# Patient Record
Sex: Male | Born: 1950 | Race: White | Hispanic: No | Marital: Married | Smoking: Never smoker
Health system: Southern US, Community
[De-identification: ages and names within clinical notes are randomized; demographics above are authoritative.]

## PROBLEM LIST (undated history)

## (undated) DIAGNOSIS — I219 Acute myocardial infarction, unspecified: Secondary | ICD-10-CM

## (undated) DIAGNOSIS — I639 Cerebral infarction, unspecified: Secondary | ICD-10-CM

## (undated) DIAGNOSIS — I739 Peripheral vascular disease, unspecified: Secondary | ICD-10-CM

## (undated) DIAGNOSIS — I2510 Atherosclerotic heart disease of native coronary artery without angina pectoris: Secondary | ICD-10-CM

## (undated) DIAGNOSIS — K219 Gastro-esophageal reflux disease without esophagitis: Secondary | ICD-10-CM

## (undated) DIAGNOSIS — I10 Essential (primary) hypertension: Secondary | ICD-10-CM

## (undated) HISTORY — PX: CORONARY ARTERY BYPASS GRAFT: SHX141

## (undated) HISTORY — PX: TONSILLECTOMY: SUR1361

## (undated) HISTORY — PX: ROTATOR CUFF REPAIR: SHX139

## (undated) HISTORY — DX: Atherosclerotic heart disease of native coronary artery without angina pectoris: I25.10

## (undated) HISTORY — PX: EYE SURGERY: SHX253

## (undated) HISTORY — DX: Cerebral infarction, unspecified: I63.9

## (undated) HISTORY — DX: Peripheral vascular disease, unspecified: I73.9

## (undated) HISTORY — PX: COLONOSCOPY WITH ESOPHAGOGASTRODUODENOSCOPY (EGD): SHX5779

## (undated) HISTORY — PX: MASTOIDECTOMY: SHX711

## (undated) HISTORY — PX: HERNIA REPAIR: SHX51

---

## 1992-04-05 DIAGNOSIS — I219 Acute myocardial infarction, unspecified: Secondary | ICD-10-CM

## 1992-04-05 HISTORY — DX: Acute myocardial infarction, unspecified: I21.9

## 2018-03-07 ENCOUNTER — Other Ambulatory Visit: Payer: Self-pay

## 2018-03-07 ENCOUNTER — Ambulatory Visit (INDEPENDENT_AMBULATORY_CARE_PROVIDER_SITE_OTHER): Payer: Medicare Other | Admitting: Vascular Surgery

## 2018-03-07 ENCOUNTER — Encounter: Payer: Self-pay | Admitting: *Deleted

## 2018-03-07 ENCOUNTER — Encounter: Payer: Self-pay | Admitting: Vascular Surgery

## 2018-03-07 DIAGNOSIS — I724 Aneurysm of artery of lower extremity: Secondary | ICD-10-CM

## 2018-03-07 MED ORDER — APIXABAN 5 MG PO TABS: 5.0000 mg | ORAL_TABLET | Freq: Two times a day (BID) | ORAL | 0 refills | Status: DC

## 2018-03-07 NOTE — Progress Notes (Signed)
Patient name: Julian Miranda MRN: [id number] DOB: 01/25/1951 Sex: male  REASON FOR CONSULT: Right popliteal aneurysm  HPI: Julian Miranda is a 67 y.o. male, with history of coronary artery disease status post PCI, hypertension that presents for evaluation of newly diagnosed right popliteal artery aneurysm.  Patient states he has been having pain behind his right knee for the last month particularly when bending or driving.  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  He presents today for further evaluation.  He states no previous family knowledge of aneurysmal disease.  He has no previous knowledge of an aneurysm.  He denies tobacco abuse.  States he does take an aspirin daily.  He is now retired but very functional and plays golf weekly.  No previous lower extremity interventions.  Past Medical History:  Diagnosis Date  . CAD (coronary artery disease)   . Peripheral vascular disease ([place])   . Stroke [facility])     History reviewed. No pertinent surgical history.  Family History  Problem Relation Age of Onset  . Heart disease Mother     SOCIAL HISTORY: Social History   Socioeconomic History  . Marital status: Married    Spouse name: Not on file  . Number of children: Not on file  . Years of education: Not on file  . Highest education level: Not on file  Occupational History  . Not on file  Social Needs  . Financial resource strain: Not on file  . Food insecurity:    Worry: Not on file    Inability: Not on file  . Transportation needs:    Medical: Not on file    Non-medical: Not on file  Tobacco Use  . Smoking status: Never Smoker  . Smokeless tobacco: Never Used  Substance and Sexual Activity  . Alcohol use: Never    Frequency: Never  . Drug use: Never  . Sexual activity: Not on file  Lifestyle  . Physical activity:    Days per week:  Not on file    Minutes per session: Not on file  . Stress: Not on file  Relationships  . Social connections:    Talks on phone: Not on file    Gets together: Not on file    Attends religious service: Not on file    Active member of club or organization: Not on file    Attends meetings of clubs or organizations: Not on file    Relationship status: Not on file  . Intimate partner violence:    Fear of current or ex partner: Not on file    Emotionally abused: Not on file    Physically abused: Not on file    Forced sexual activity: Not on file  Other Topics Concern  . Not on file  Social History Narrative  . Not on file    No Known Allergies  Current Outpatient Medications  Medication Sig Dispense Refill  . ALPRAZolam (XANAX) 0.25 MG tablet     . amLODipine (NORVASC) 10 MG tablet     . aspirin 81 MG tablet Take 81 mg by mouth daily.    Marland Kitchen atorvastatin (LIPITOR) 10 MG tablet     . cloNIDine (CATAPRES) 0.2 MG tablet     . diazepam (VALIUM) 2 MG tablet     . irbesartan-hydrochlorothiazide (AVALIDE) 300-12.5 MG tablet     .  meclizine (ANTIVERT) 25 MG tablet Take 25 mg by mouth as needed for dizziness.    Marland Kitchen omeprazole (PRILOSEC) 20 MG capsule      No current facility-administered medications for this visit.     REVIEW OF SYSTEMS:  [X]  denotes positive finding, [ ]  denotes negative finding Cardiac  Comments:  Chest pain or chest pressure:    Shortness of breath upon exertion:    Short of breath when lying flat:    Irregular heart rhythm:        Vascular    Pain in calf, thigh, or hip brought on by ambulation:    Pain in feet at night that wakes you up from your sleep:     Blood clot in your veins:    Leg swelling:     Pain behind right knee x   Pulmonary    Oxygen at home:    Productive cough:     Wheezing:         Neurologic    Sudden weakness in arms or legs:     Sudden numbness in arms or legs:     Sudden onset of difficulty speaking or slurred speech:      Temporary loss of vision in one eye:     Problems with dizziness:         Gastrointestinal    Blood in stool:     Vomited blood:         Genitourinary    Burning when urinating:     Blood in urine:        Psychiatric    Major depression:         Hematologic    Bleeding problems:    Problems with blood clotting too easily:        Skin    Rashes or ulcers:        Constitutional    Fever or chills:      PHYSICAL EXAM: Vitals:   03/07/18 1417  BP: (!) 153/90  Pulse: 79  Resp: 18  SpO2: 95%  Weight: 232 lb (105.2 kg)  Height: 5\' 10"  (1.778 m)    GENERAL: The patient is a well-nourished male, in no acute distress. The vital signs are documented above. CARDIAC: There is a regular rate and rhythm.  VASCULAR:  2+ radial pulse palpable bilateral upper extremities 2+ palpable bilateral femoral pulses 2+ left popliteal pulse Bounding right popliteal pulse 2+ palpable DP/PT bilateral feet, no tissue loss PULMONARY: There is good air exchange bilaterally without wheezing or rales. ABDOMEN: Soft and non-tender with normal pitched bowel sounds.  No appreciable aneurysm. MUSCULOSKELETAL: There are no major deformities or cyanosis. NEUROLOGIC: No focal weakness or paresthesias are detected. SKIN: There are no ulcers or rashes noted. PSYCHIATRIC: The patient has a normal affect.  DATA:   I independently reviewed his MRI which does show an approximate 4 cm popliteal aneurysm on the right with evidence of laminar thrombus - limited study and unable to evaluate run-off or full extent of aneurysm.  Assessment/Plan:  I had a long discussion with Julian Miranda regarding the etiology and nature of popliteal artery aneurysms.  Discussed that we typically repair popliteal aneurysms greater than 2 cm given the risk for thrombosis and/or embolism.  Given his aneurysm is 4 cm on MRI I think he certainly merits repair.  Unfortunately the MRI that was obtained is somewhat limited for operative  planning.  I have subsequently recommended a CTA abdomen pelvis with bilateral lower extremity runoff.  This will allow [place] to further evaluate his right popliteal aneurysm and discussed options including endovascular covered stent versus bypass either through a medial versus posterior approach.  In addition we can rule out any evidence of an associated abdominal aortic aneurysm or contralateral popliteal artery aneurysm that are frequently associated.  I will go ahead and schedule him for surgery on 03/27/2018 for right popliteal aneurysm repair with tentative plans for bypass and exclusion.  Specific plans will change according to CTA results and vein mapping.   Given the large size of his aneurysm and the fact that there is thrombus within the aneurysm I also put him on 5 mg of Eliquis twice daily while we obtain further imaging for work-up and while awaiting surgical intervention.  I will contact him after review of CT and vein mapping for final plans.   Marty Heck, MD [facility] Office: [phone] Pager: [place]

## 2018-03-08 ENCOUNTER — Ambulatory Visit (HOSPITAL_COMMUNITY)
Admission: RE | Admit: 2018-03-08 | Discharge: 2018-03-08 | Disposition: A | Discharge status: Home / Self Care | Payer: Medicare Other | Source: Ambulatory Visit | Attending: Internal Medicine | Admitting: Internal Medicine

## 2018-03-08 ENCOUNTER — Other Ambulatory Visit: Payer: Self-pay | Admitting: *Deleted

## 2018-03-08 DIAGNOSIS — I724 Aneurysm of artery of lower extremity: Secondary | ICD-10-CM | POA: Diagnosis present

## 2018-03-10 ENCOUNTER — Ambulatory Visit
Admission: RE | Admit: 2018-03-10 | Discharge: 2018-03-10 | Disposition: A | Discharge status: Home / Self Care | Payer: Medicare Other | Source: Ambulatory Visit | Attending: Vascular Surgery | Admitting: Vascular Surgery

## 2018-03-10 DIAGNOSIS — I724 Aneurysm of artery of lower extremity: Secondary | ICD-10-CM

## 2018-03-10 MED ORDER — IOPAMIDOL (ISOVUE-370) INJECTION 76%
125.0000 mL | Freq: Once | INTRAVENOUS | Status: AC | PRN
  Administered 2018-03-10: 125 mL via INTRAVENOUS

## 2018-03-15 ENCOUNTER — Telehealth: Payer: Self-pay | Admitting: Vascular Surgery

## 2018-03-15 NOTE — Telephone Encounter (Signed)
Called and talked to [name] since unable to get in touch with [name] on the phone.  Discussed review of CTA that shows greater than 4 cm right popliteal aneurysm.  He has adequate great saphenous vein in his right leg.  We will plan for right popliteal artery aneurysm repair next week with a bypass and exclusion.  Discussed that I will try and call again later this week or early next week to see if he has any additional questions.  Marty Heck, MD [facility] [place] Office: [phone] Pager: [place]

## 2018-03-16 NOTE — Pre-Procedure Instructions (Signed)
Paden Senger  03/16/2018      [facility] [place], [place] [address] [place] [address] Phone: [phone] Fax: [phone]    Your procedure is scheduled on Thursday December 19.  Report to [facility] Admitting at 5:30 A.M.  Call this number if you have problems the morning of surgery:  [phone]   Remember:  Do not eat or drink after midnight.    Take these medicines the morning of surgery with A SIP OF WATER:   Clonidine (Catapres) Metoprolol (Toprol XL) Prilosec (Omeprazole) Xanax if needed  7 days prior to surgery STOP taking any Aleve, Naproxen, Ibuprofen, Motrin, Advil, Goody's, BC's, all herbal medications, fish oil, and all vitamins  FOLLOW YOUR surgeon's instructions on STOPPING Aspirin and Eliquis (Apixaban). If no instructions were given, please call your surgeon's office.     Do not wear jewelry, make-up or nail polish.  Do not wear lotions, powders, or perfumes, or deodorant.  Do not shave 48 hours prior to surgery.  Men may shave face and neck.  Do not bring valuables to the hospital.  [facility] is not responsible for any belongings or valuables.  Contacts, dentures or bridgework may not be worn into surgery.  Leave your suitcase in the car.  After surgery it may be brought to your room.  For patients admitted to the hospital, discharge time will be determined by your treatment team.  Patients discharged the day of surgery will not be allowed to drive home.   Special instructions:    [place]- Preparing For Surgery  Before surgery, you can play an important role. Because skin is not sterile, your skin needs to be as free of germs as possible. You can reduce the number of germs on your skin by washing with CHG (chlorahexidine gluconate) Soap before surgery.  CHG is an antiseptic cleaner which kills germs and bonds with the skin to continue killing germs even after washing.     Oral Hygiene is also important to reduce your risk of infection.  Remember - BRUSH YOUR TEETH THE MORNING OF SURGERY WITH YOUR REGULAR TOOTHPASTE  Please do not use if you have an allergy to CHG or antibacterial soaps. If your skin becomes reddened/irritated stop using the CHG.  Do not shave (including legs and underarms) for at least 48 hours prior to first CHG shower. It is OK to shave your face.  Please follow these instructions carefully.   1. Shower the NIGHT BEFORE SURGERY and the MORNING OF SURGERY with CHG.   2. If you chose to wash your hair, wash your hair first as usual with your normal shampoo.  3. After you shampoo, rinse your hair and body thoroughly to remove the shampoo.  4. Use CHG as you would any other liquid soap. You can apply CHG directly to the skin and wash gently with a scrungie or a clean washcloth.   5. Apply the CHG Soap to your body ONLY FROM THE NECK DOWN.  Do not use on open wounds or open sores. Avoid contact with your eyes, ears, mouth and genitals (private parts). Wash Face and genitals (private parts)  with your normal soap.  6. Wash thoroughly, paying special attention to the area where your surgery will be performed.  7. Thoroughly rinse your body with warm water from the neck down.  8. DO NOT shower/wash with your normal soap after using and rinsing off the CHG Soap.  9.  Pat yourself dry with a CLEAN TOWEL.  10. Wear CLEAN PAJAMAS to bed the night before surgery, wear comfortable clothes the morning of surgery  11. Place CLEAN SHEETS on your bed the night of your first shower and DO NOT SLEEP WITH PETS.    Day of Surgery:  Do not apply any deodorants/lotions.  Please wear clean clothes to the hospital/surgery center.   Remember to brush your teeth WITH YOUR REGULAR TOOTHPASTE.    Please read over the following fact sheets that you were given. Coughing and Deep Breathing, MRSA Information and Surgical Site Infection  Prevention

## 2018-03-17 ENCOUNTER — Encounter (HOSPITAL_COMMUNITY): Payer: Self-pay

## 2018-03-17 ENCOUNTER — Encounter (HOSPITAL_COMMUNITY)
Admission: RE | Admit: 2018-03-17 | Discharge: 2018-03-17 | Disposition: A | Discharge status: Home / Self Care | Payer: Medicare Other | Source: Ambulatory Visit | Attending: Vascular Surgery | Admitting: Vascular Surgery

## 2018-03-17 ENCOUNTER — Other Ambulatory Visit: Payer: Self-pay

## 2018-03-17 DIAGNOSIS — I739 Peripheral vascular disease, unspecified: Secondary | ICD-10-CM | POA: Insufficient documentation

## 2018-03-17 DIAGNOSIS — Z7982 Long term (current) use of aspirin: Secondary | ICD-10-CM | POA: Diagnosis not present

## 2018-03-17 DIAGNOSIS — I2510 Atherosclerotic heart disease of native coronary artery without angina pectoris: Secondary | ICD-10-CM | POA: Insufficient documentation

## 2018-03-17 DIAGNOSIS — Z01812 Encounter for preprocedural laboratory examination: Secondary | ICD-10-CM | POA: Diagnosis not present

## 2018-03-17 DIAGNOSIS — I724 Aneurysm of artery of lower extremity: Secondary | ICD-10-CM | POA: Insufficient documentation

## 2018-03-17 DIAGNOSIS — Z79899 Other long term (current) drug therapy: Secondary | ICD-10-CM | POA: Diagnosis not present

## 2018-03-17 HISTORY — DX: Essential (primary) hypertension: I10

## 2018-03-17 HISTORY — DX: Acute myocardial infarction, unspecified: I21.9

## 2018-03-17 LAB — SURGICAL PCR SCREEN
MRSA, PCR: NEGATIVE
Staphylococcus aureus: NEGATIVE

## 2018-03-17 LAB — PROTIME-INR
INR: 1.18
PROTHROMBIN TIME: 14.8 s (ref 11.4–15.2)

## 2018-03-17 LAB — TYPE AND SCREEN
ABO/RH(D): O POS
Antibody Screen: NEGATIVE

## 2018-03-17 LAB — URINALYSIS, ROUTINE W REFLEX MICROSCOPIC
Bacteria, UA: NONE SEEN
Bilirubin Urine: NEGATIVE
Glucose, UA: NEGATIVE mg/dL
Hgb urine dipstick: NEGATIVE
Ketones, ur: NEGATIVE mg/dL
Leukocytes, UA: NEGATIVE
Nitrite: NEGATIVE
Protein, ur: NEGATIVE mg/dL
Specific Gravity, Urine: 1.005 (ref 1.005–1.030)
pH: 7 (ref 5.0–8.0)

## 2018-03-17 LAB — COMPREHENSIVE METABOLIC PANEL
ALT: 53 U/L — ABNORMAL HIGH (ref 0–44)
AST: 28 U/L (ref 15–41)
Albumin: 4.8 g/dL (ref 3.5–5.0)
Alkaline Phosphatase: 60 U/L (ref 38–126)
Anion gap: 13 (ref 5–15)
BUN: 13 mg/dL (ref 8–23)
CO2: 27 mmol/L (ref 22–32)
Calcium: 9.9 mg/dL (ref 8.9–10.3)
Chloride: 99 mmol/L (ref 98–111)
Creatinine, Ser: 0.98 mg/dL (ref 0.61–1.24)
GFR calc Af Amer: >60 mL/min (ref >60)
GFR calc non Af Amer: >60 mL/min (ref >60)
Glucose, Bld: 100 mg/dL — ABNORMAL HIGH (ref 70–99)
Potassium: 3.7 mmol/L (ref 3.5–5.1)
Sodium: 139 mmol/L (ref 135–145)
Total Bilirubin: 1.4 mg/dL — ABNORMAL HIGH (ref 0.3–1.2)
Total Protein: 7.9 g/dL (ref 6.5–8.1)

## 2018-03-17 LAB — CBC
HCT: 46.8 % (ref 39.0–52.0)
Hemoglobin: 15.4 g/dL (ref 13.0–17.0)
MCH: 30.9 pg (ref 26.0–34.0)
MCHC: 32.9 g/dL (ref 30.0–36.0)
MCV: 94 fL (ref 80.0–100.0)
NRBC: 0 % (ref 0.0–0.2)
Platelets: 254 10*3/uL (ref 150–400)
RBC: 4.98 MIL/uL (ref 4.22–5.81)
RDW: 13.2 % (ref 11.5–15.5)
WBC: 6.6 10*3/uL (ref 4.0–10.5)

## 2018-03-17 LAB — ABO/RH: ABO/RH(D): O POS

## 2018-03-17 LAB — APTT: aPTT: 36 seconds (ref 24–36)

## 2018-03-17 NOTE — Progress Notes (Signed)
PCP - Lonia Mad MD Cardiologist - Cleora Fleet MD  Chest x-ray - N/A  EKG - requested Stress Test - requested ECHO - requested  Blood Thinner Instructions: Last dose will be 12/16 Per [facility] Aspirin Instructions:Last dose will be 12/16 Per [facility]  Anesthesia review: Hx CAD, requested docs from [place]  Patient denies shortness of breath, fever, cough and chest pain at PAT appointment   Patient verbalized understanding of instructions that were given to them at the PAT appointment. Patient was also instructed that they will need to review over the PAT instructions again at home before surgery.

## 2018-03-20 NOTE — Progress Notes (Signed)
Anesthesia Chart Review:  Case:  [id number] Date/Time:  03/23/18 0715   Procedure:  BYPASS GRAFT POPLITEAL TO POPLITEAL (Right )   Anesthesia type:  General   Pre-op diagnosis:  right popliteal aneurysm   Location:  [facility] OR ROOM 16 / [facility] OR   Surgeon:  Marty Heck, MD      DISCUSSION: 67 yo male never smoker. Pertinent hx includes CAD (s/p Lt CX PTCA 1993 and 2015), TIA, HTN, PVD.  Pt follows with cardiology, Dr. Cleora Fleet. Per last OV note 11/03/2017, "History of known coronary artery disease with left circumflex PTCA.  Able to maintain same level activity without any fatigue or shortness of breath.  No exertional or rest chest pain.  Able to lie flat without a problem does not wake up at night because of shortness of breath, no swelling reported in lower extremities."  Echo 06/03/2017 shows normal left ventricular systolic function, EF 21%, no segmental wall motion abnormalities, mild TR, trace MR.  Anticipate he can proceed as planned barring acute status change.  VS: BP 132/75   Pulse 95   Temp (!) 36.4 C   Resp 18   Ht 5\' 10"  (1.778 m)   Wt 103.7 kg   BMI 32.82 kg/m   PROVIDERS: Lonia Mad, MD is PCP  Cleora Fleet, MD is Cardiologist   LABS: Labs reviewed: Acceptable for surgery. (all labs ordered are listed, but only abnormal results are displayed)  Labs Reviewed  COMPREHENSIVE METABOLIC PANEL - Abnormal; Notable for the following components:      Result Value   Glucose, Bld 100 (*)    ALT 53 (*)    Total Bilirubin 1.4 (*)    All other components within normal limits  URINALYSIS, ROUTINE W REFLEX MICROSCOPIC - Abnormal; Notable for the following components:   Color, Urine STRAW (*)    All other components within normal limits  SURGICAL PCR SCREEN  APTT  CBC  PROTIME-INR  TYPE AND SCREEN  ABO/RH    EKG: 11/03/2017 (outside record, copy on pt chart): Sinus rhythm, Rate 73. Occassionally ectopic ventricular beat. Inferior infarct, age undetermined.  Old anterior infarct.  CV: TTE 06/03/2017 (outside record, copy on pt chart):: Impression: Study shows normal left ventricular systolic function ejection fraction 55%, no segmental wall motion abnormality seen.  Findings: Concentric left ventricular hypertrophy grade 1 diastolic function.  Dilated IVC with normal inspiratory collapse.  All chambers appear to be within normal limits.  Atrial septum intact with no evidence of atrial or ventricular septal defect.  There is no intracardiac thrombus or pericardial effusion seen.  Aortic valve sclerosis, trileaflet, adequate cusp separation no evidence of stenosis.  Mitral valve shows annular calcification, delicate leaflets adequate diastolic excursion.  Tricuspid valve is within normal limits.  Pulmonary valve poorly visualized.  Doppler and color-flow shows mild TR trace MR.  Past Medical History:  Diagnosis Date  . CAD (coronary artery disease)   . Hypertension   . Myocardial infarction ([place]) 1994  . Peripheral vascular disease ([place])   . Stroke [facility])     Past Surgical History:  Procedure Laterality Date  . COLONOSCOPY WITH ESOPHAGOGASTRODUODENOSCOPY (EGD)    . EYE SURGERY Bilateral    cataracts  . HERNIA REPAIR    . MASTOIDECTOMY    . ROTATOR CUFF REPAIR Right     MEDICATIONS: . ALPRAZolam (XANAX) 0.25 MG tablet  . amLODipine (NORVASC) 10 MG tablet  . apixaban (ELIQUIS) 5 MG TABS tablet  . aspirin 81 MG tablet  .  atorvastatin (LIPITOR) 10 MG tablet  . cloNIDine (CATAPRES) 0.2 MG tablet  . diazepam (VALIUM) 2 MG tablet  . irbesartan-hydrochlorothiazide (AVALIDE) 300-12.5 MG tablet  . meclizine (ANTIVERT) 25 MG tablet  . metoprolol succinate (TOPROL-XL) 25 MG 24 hr tablet  . Multiple Minerals (CALCIUM/MAGNESIUM/ZINC PO)  . omeprazole (PRILOSEC) 20 MG capsule   No current facility-administered medications for this encounter.      Wynonia Musty [facility] Short Stay Center/Anesthesiology Phone [phone] 03/20/2018 4:37  PM

## 2018-03-20 NOTE — Anesthesia Preprocedure Evaluation (Addendum)
Anesthesia Evaluation  Patient identified by MRN, date of birth, ID band Patient awake    Reviewed: Allergy & Precautions, NPO status , Patient's Chart, lab work & pertinent test results, reviewed documented beta blocker date and time   History of Anesthesia Complications Negative for: history of anesthetic complications  Airway Mallampati: II  TM Distance: >3 FB Neck ROM: Full    Dental  (+) Caps, Dental Advisory Given   Pulmonary neg pulmonary ROS,    breath sounds clear to auscultation       Cardiovascular hypertension, Pt. on medications and Pt. on home beta blockers (-) angina+ CAD (PTCA) and + Peripheral Vascular Disease   Rhythm:Regular Rate:Normal  3/19 ECHO: EF 55%,valves OK   Neuro/Psych TIAnegative psych ROS   GI/Hepatic Neg liver ROS, GERD  Medicated and Controlled,  Endo/Other  Morbid obesity  Renal/GU negative Renal ROS     Musculoskeletal   Abdominal (+) + obese,   Peds  Hematology eliquis   Anesthesia Other Findings   Reproductive/Obstetrics                           Anesthesia Physical Anesthesia Plan  ASA: III  Anesthesia Plan: General   Post-op Pain Management:    Induction: Intravenous  PONV Risk Score and Plan: 2 and Ondansetron and Dexamethasone  Airway Management Planned: Oral ETT  Additional Equipment:   Intra-op Plan:   Post-operative Plan: Extubation in OR  Informed Consent: I have reviewed the patients History and Physical, chart, labs and discussed the procedure including the risks, benefits and alternatives for the proposed anesthesia with the patient or authorized representative who has indicated his/her understanding and acceptance.   Dental advisory given  Plan Discussed with: CRNA and Surgeon  Anesthesia Plan Comments: (See PAT note 03/17/2018 by Karoline Caldwell, PA-C Plan routine monitors, GETA)     Anesthesia Quick Evaluation

## 2018-03-23 ENCOUNTER — Other Ambulatory Visit: Payer: Self-pay

## 2018-03-23 ENCOUNTER — Inpatient Hospital Stay (HOSPITAL_COMMUNITY)
Admission: RE | Admit: 2018-03-23 | Discharge: 2018-03-27 | DRG: 254 | Disposition: A | Discharge status: Home Health | Payer: Medicare Other | Attending: Vascular Surgery | Admitting: Vascular Surgery

## 2018-03-23 ENCOUNTER — Inpatient Hospital Stay (HOSPITAL_COMMUNITY): Payer: Medicare Other | Admitting: Certified Registered"

## 2018-03-23 ENCOUNTER — Encounter (HOSPITAL_COMMUNITY): Payer: Self-pay | Admitting: *Deleted

## 2018-03-23 ENCOUNTER — Encounter (HOSPITAL_COMMUNITY)
Admission: RE | Disposition: A | Discharge status: Home Health | Payer: Self-pay | Source: Home / Self Care | Attending: Vascular Surgery

## 2018-03-23 ENCOUNTER — Inpatient Hospital Stay (HOSPITAL_COMMUNITY): Payer: Medicare Other | Admitting: Vascular Surgery

## 2018-03-23 DIAGNOSIS — Z955 Presence of coronary angioplasty implant and graft: Secondary | ICD-10-CM

## 2018-03-23 DIAGNOSIS — I739 Peripheral vascular disease, unspecified: Secondary | ICD-10-CM | POA: Diagnosis present

## 2018-03-23 DIAGNOSIS — I2510 Atherosclerotic heart disease of native coronary artery without angina pectoris: Secondary | ICD-10-CM | POA: Diagnosis present

## 2018-03-23 DIAGNOSIS — Z8673 Personal history of transient ischemic attack (TIA), and cerebral infarction without residual deficits: Secondary | ICD-10-CM | POA: Diagnosis not present

## 2018-03-23 DIAGNOSIS — M25571 Pain in right ankle and joints of right foot: Secondary | ICD-10-CM | POA: Diagnosis not present

## 2018-03-23 DIAGNOSIS — I10 Essential (primary) hypertension: Secondary | ICD-10-CM | POA: Diagnosis present

## 2018-03-23 DIAGNOSIS — I724 Aneurysm of artery of lower extremity: Principal | ICD-10-CM | POA: Diagnosis present

## 2018-03-23 DIAGNOSIS — Z79899 Other long term (current) drug therapy: Secondary | ICD-10-CM | POA: Diagnosis not present

## 2018-03-23 DIAGNOSIS — Z8739 Personal history of other diseases of the musculoskeletal system and connective tissue: Secondary | ICD-10-CM | POA: Diagnosis not present

## 2018-03-23 DIAGNOSIS — Z8249 Family history of ischemic heart disease and other diseases of the circulatory system: Secondary | ICD-10-CM

## 2018-03-23 DIAGNOSIS — Z7982 Long term (current) use of aspirin: Secondary | ICD-10-CM | POA: Diagnosis not present

## 2018-03-23 HISTORY — DX: Gastro-esophageal reflux disease without esophagitis: K21.9

## 2018-03-23 HISTORY — PX: VEIN HARVEST: SHX6363

## 2018-03-23 HISTORY — PX: BYPASS GRAFT POPLITEAL TO POPLITEAL: SHX5763

## 2018-03-23 HISTORY — PX: FEMORAL BYPASS: SHX50

## 2018-03-23 LAB — CREATININE, SERUM
Creatinine, Ser: 1.02 mg/dL (ref 0.61–1.24)
GFR calc Af Amer: >60 mL/min (ref >60)
GFR calc non Af Amer: >60 mL/min (ref >60)

## 2018-03-23 LAB — CBC
HCT: 45.2 % (ref 39.0–52.0)
Hemoglobin: 15.5 g/dL (ref 13.0–17.0)
MCH: 31.9 pg (ref 26.0–34.0)
MCHC: 34.3 g/dL (ref 30.0–36.0)
MCV: 93 fL (ref 80.0–100.0)
PLATELETS: 234 10*3/uL (ref 150–400)
RBC: 4.86 MIL/uL (ref 4.22–5.81)
RDW: 13.1 % (ref 11.5–15.5)
WBC: 12.4 10*3/uL — ABNORMAL HIGH (ref 4.0–10.5)
nRBC: 0 % (ref 0.0–0.2)

## 2018-03-23 LAB — PROTIME-INR
INR: 1.03
Prothrombin Time: 13.4 seconds (ref 11.4–15.2)

## 2018-03-23 SURGERY — CREATION, BYPASS, ARTERIAL, POPLITEAL
Anesthesia: General | Laterality: Right

## 2018-03-23 MED ORDER — DIPHENHYDRAMINE HCL 12.5 MG/5ML PO ELIX
12.5000 mg | ORAL_SOLUTION | Freq: Four times a day (QID) | ORAL | Status: DC | PRN
  Filled 2018-03-23: qty 5

## 2018-03-23 MED ORDER — DOCUSATE SODIUM 100 MG PO CAPS
100.0000 mg | ORAL_CAPSULE | Freq: Every day | ORAL | Status: DC
  Administered 2018-03-24 – 2018-03-27 (×4): 100 mg via ORAL
  Filled 2018-03-23 (×4): qty 1

## 2018-03-23 MED ORDER — ACETAMINOPHEN 325 MG RE SUPP: 325.0000 mg | RECTAL | Status: DC | PRN

## 2018-03-23 MED ORDER — HEPARIN SODIUM (PORCINE) 1000 UNIT/ML IJ SOLN
INTRAMUSCULAR | Status: AC
  Filled 2018-03-23: qty 1

## 2018-03-23 MED ORDER — HYDRALAZINE HCL 20 MG/ML IJ SOLN: 5.0000 mg | INTRAMUSCULAR | Status: DC | PRN

## 2018-03-23 MED ORDER — MIDAZOLAM HCL 2 MG/2ML IJ SOLN
INTRAMUSCULAR | Status: AC
  Filled 2018-03-23: qty 2

## 2018-03-23 MED ORDER — ACETAMINOPHEN 325 MG PO TABS: 325.0000 mg | ORAL_TABLET | ORAL | Status: DC | PRN

## 2018-03-23 MED ORDER — SODIUM CHLORIDE 0.9% FLUSH: 9.0000 mL | INTRAVENOUS | Status: DC | PRN

## 2018-03-23 MED ORDER — POLYETHYLENE GLYCOL 3350 17 G PO PACK
17.0000 g | PACK | Freq: Every day | ORAL | Status: DC | PRN
  Filled 2018-03-23: qty 1

## 2018-03-23 MED ORDER — SODIUM CHLORIDE 0.9 % IV SOLN
INTRAVENOUS | Status: DC
  Administered 2018-03-23: 15:00:00 via INTRAVENOUS

## 2018-03-23 MED ORDER — METOPROLOL TARTRATE 5 MG/5ML IV SOLN: 2.0000 mg | INTRAVENOUS | Status: DC | PRN

## 2018-03-23 MED ORDER — IRBESARTAN-HYDROCHLOROTHIAZIDE 300-12.5 MG PO TABS: 1.0000 | ORAL_TABLET | Freq: Every day | ORAL | Status: DC

## 2018-03-23 MED ORDER — MORPHINE SULFATE 2 MG/ML IV SOLN
INTRAVENOUS | Status: DC
  Administered 2018-03-23: 17:00:00 via INTRAVENOUS
  Administered 2018-03-24: 9 mg via INTRAVENOUS
  Administered 2018-03-24: 14.24 mg via INTRAVENOUS
  Administered 2018-03-24: 15.76 mg via INTRAVENOUS
  Administered 2018-03-24: 1.5 mg via INTRAVENOUS
  Administered 2018-03-25: 12 mg via INTRAVENOUS
  Administered 2018-03-25: 13.5 mg via INTRAVENOUS
  Administered 2018-03-25: 1 mg via INTRAVENOUS
  Administered 2018-03-25: 01:00:00 via INTRAVENOUS
  Administered 2018-03-26: 2 mg via INTRAVENOUS
  Administered 2018-03-26: 4.5 mg via INTRAVENOUS
  Filled 2018-03-23 (×4): qty 30

## 2018-03-23 MED ORDER — ALPRAZOLAM 0.25 MG PO TABS
0.2500 mg | ORAL_TABLET | Freq: Three times a day (TID) | ORAL | Status: DC | PRN
  Administered 2018-03-23 – 2018-03-24 (×2): 0.25 mg via ORAL
  Filled 2018-03-23 (×2): qty 1

## 2018-03-23 MED ORDER — HEPARIN SODIUM (PORCINE) 5000 UNIT/ML IJ SOLN
5000.0000 [IU] | Freq: Three times a day (TID) | INTRAMUSCULAR | Status: DC
  Administered 2018-03-23 – 2018-03-27 (×12): 5000 [IU] via SUBCUTANEOUS
  Filled 2018-03-23 (×13): qty 1

## 2018-03-23 MED ORDER — DEXAMETHASONE SODIUM PHOSPHATE 10 MG/ML IJ SOLN
INTRAMUSCULAR | Status: AC
  Filled 2018-03-23: qty 2

## 2018-03-23 MED ORDER — LIDOCAINE 2% (20 MG/ML) 5 ML SYRINGE
INTRAMUSCULAR | Status: DC | PRN
  Administered 2018-03-23: 40 mg via INTRAVENOUS

## 2018-03-23 MED ORDER — ATORVASTATIN CALCIUM 10 MG PO TABS
10.0000 mg | ORAL_TABLET | Freq: Every day | ORAL | Status: DC
  Administered 2018-03-23 – 2018-03-26 (×4): 10 mg via ORAL
  Filled 2018-03-23 (×4): qty 1

## 2018-03-23 MED ORDER — FENTANYL CITRATE (PF) 100 MCG/2ML IJ SOLN
INTRAMUSCULAR | Status: DC | PRN
  Administered 2018-03-23 (×2): 50 ug via INTRAVENOUS
  Administered 2018-03-23: 100 ug via INTRAVENOUS
  Administered 2018-03-23 (×2): 50 ug via INTRAVENOUS

## 2018-03-23 MED ORDER — PROPOFOL 10 MG/ML IV BOLUS
INTRAVENOUS | Status: AC
  Filled 2018-03-23: qty 20

## 2018-03-23 MED ORDER — PANTOPRAZOLE SODIUM 40 MG PO TBEC
40.0000 mg | DELAYED_RELEASE_TABLET | Freq: Every day | ORAL | Status: DC
  Administered 2018-03-24 – 2018-03-27 (×4): 40 mg via ORAL
  Filled 2018-03-23 (×4): qty 1

## 2018-03-23 MED ORDER — SODIUM CHLORIDE 0.9 % IV SOLN
INTRAVENOUS | Status: DC | PRN
  Administered 2018-03-23: 07:00:00

## 2018-03-23 MED ORDER — ROCURONIUM BROMIDE 50 MG/5ML IV SOSY
PREFILLED_SYRINGE | INTRAVENOUS | Status: AC
  Filled 2018-03-23: qty 10

## 2018-03-23 MED ORDER — CLONIDINE HCL 0.2 MG PO TABS
0.2000 mg | ORAL_TABLET | Freq: Two times a day (BID) | ORAL | Status: DC
  Administered 2018-03-23 – 2018-03-27 (×9): 0.2 mg via ORAL
  Filled 2018-03-23 (×9): qty 1

## 2018-03-23 MED ORDER — ASPIRIN 81 MG PO CHEW
81.0000 mg | CHEWABLE_TABLET | Freq: Every day | ORAL | Status: DC
  Administered 2018-03-23 – 2018-03-27 (×5): 81 mg via ORAL
  Filled 2018-03-23 (×5): qty 1

## 2018-03-23 MED ORDER — GUAIFENESIN-DM 100-10 MG/5ML PO SYRP: 15.0000 mL | ORAL_SOLUTION | ORAL | Status: DC | PRN

## 2018-03-23 MED ORDER — SODIUM CHLORIDE 0.9 % IV SOLN: 500.0000 mL | Freq: Once | INTRAVENOUS | Status: DC | PRN

## 2018-03-23 MED ORDER — FENTANYL CITRATE (PF) 250 MCG/5ML IJ SOLN
INTRAMUSCULAR | Status: AC
  Filled 2018-03-23: qty 5

## 2018-03-23 MED ORDER — AMLODIPINE BESYLATE 10 MG PO TABS
10.0000 mg | ORAL_TABLET | Freq: Every day | ORAL | Status: DC
  Administered 2018-03-23 – 2018-03-26 (×4): 10 mg via ORAL
  Filled 2018-03-23 (×4): qty 1

## 2018-03-23 MED ORDER — DIAZEPAM 2 MG PO TABS: 2.0000 mg | ORAL_TABLET | Freq: Two times a day (BID) | ORAL | Status: DC | PRN

## 2018-03-23 MED ORDER — CEFAZOLIN SODIUM-DEXTROSE 2-4 GM/100ML-% IV SOLN
2.0000 g | INTRAVENOUS | Status: AC
  Administered 2018-03-23 (×2): 2 g via INTRAVENOUS
  Filled 2018-03-23: qty 100

## 2018-03-23 MED ORDER — HEMOSTATIC AGENTS (NO CHARGE) OPTIME
TOPICAL | Status: DC | PRN
  Administered 2018-03-23: 1 via TOPICAL

## 2018-03-23 MED ORDER — FENTANYL CITRATE (PF) 100 MCG/2ML IJ SOLN: 25.0000 ug | INTRAMUSCULAR | Status: DC | PRN

## 2018-03-23 MED ORDER — DIPHENHYDRAMINE HCL 50 MG/ML IJ SOLN: 12.5000 mg | Freq: Four times a day (QID) | INTRAMUSCULAR | Status: DC | PRN

## 2018-03-23 MED ORDER — MIDAZOLAM HCL 2 MG/2ML IJ SOLN: 0.5000 mg | Freq: Once | INTRAMUSCULAR | Status: DC | PRN

## 2018-03-23 MED ORDER — MAGNESIUM SULFATE 2 GM/50ML IV SOLN: 2.0000 g | Freq: Every day | INTRAVENOUS | Status: DC | PRN

## 2018-03-23 MED ORDER — LIDOCAINE 2% (20 MG/ML) 5 ML SYRINGE
INTRAMUSCULAR | Status: AC
  Filled 2018-03-23: qty 5

## 2018-03-23 MED ORDER — CHLORHEXIDINE GLUCONATE CLOTH 2 % EX PADS: 6.0000 | MEDICATED_PAD | Freq: Once | CUTANEOUS | Status: DC

## 2018-03-23 MED ORDER — METOPROLOL SUCCINATE ER 25 MG PO TB24
25.0000 mg | ORAL_TABLET | Freq: Two times a day (BID) | ORAL | Status: DC
  Administered 2018-03-23 – 2018-03-27 (×8): 25 mg via ORAL
  Filled 2018-03-23 (×9): qty 1

## 2018-03-23 MED ORDER — POTASSIUM CHLORIDE CRYS ER 20 MEQ PO TBCR: 20.0000 meq | EXTENDED_RELEASE_TABLET | Freq: Every day | ORAL | Status: DC | PRN

## 2018-03-23 MED ORDER — 0.9 % SODIUM CHLORIDE (POUR BTL) OPTIME
TOPICAL | Status: DC | PRN
  Administered 2018-03-23: 2000 mL

## 2018-03-23 MED ORDER — METOPROLOL SUCCINATE ER 25 MG PO TB24
ORAL_TABLET | ORAL | Status: AC
  Filled 2018-03-23: qty 1

## 2018-03-23 MED ORDER — PROTAMINE SULFATE 10 MG/ML IV SOLN
INTRAVENOUS | Status: DC | PRN
  Administered 2018-03-23: 30 mg via INTRAVENOUS
  Administered 2018-03-23: 10 mg via INTRAVENOUS
  Administered 2018-03-23: 750 mg via INTRAVENOUS

## 2018-03-23 MED ORDER — PROTAMINE SULFATE 10 MG/ML IV SOLN
INTRAVENOUS | Status: AC
  Filled 2018-03-23: qty 5

## 2018-03-23 MED ORDER — SODIUM CHLORIDE 0.9 % IV SOLN
INTRAVENOUS | Status: AC
  Filled 2018-03-23: qty 1.2

## 2018-03-23 MED ORDER — PROPOFOL 10 MG/ML IV BOLUS
INTRAVENOUS | Status: DC | PRN
  Administered 2018-03-23: 120 mg via INTRAVENOUS
  Administered 2018-03-23: 20 mg via INTRAVENOUS

## 2018-03-23 MED ORDER — METOPROLOL SUCCINATE ER 25 MG PO TB24
25.0000 mg | ORAL_TABLET | Freq: Every day | ORAL | Status: DC
  Administered 2018-03-23: 25 mg via ORAL
  Filled 2018-03-23: qty 1

## 2018-03-23 MED ORDER — MIDAZOLAM HCL 5 MG/5ML IJ SOLN
INTRAMUSCULAR | Status: DC | PRN
  Administered 2018-03-23: 2 mg via INTRAVENOUS

## 2018-03-23 MED ORDER — MEPERIDINE HCL 50 MG/ML IJ SOLN: 6.2500 mg | INTRAMUSCULAR | Status: DC | PRN

## 2018-03-23 MED ORDER — NALOXONE HCL 0.4 MG/ML IJ SOLN: 0.4000 mg | INTRAMUSCULAR | Status: DC | PRN

## 2018-03-23 MED ORDER — ONDANSETRON HCL 4 MG/2ML IJ SOLN
INTRAMUSCULAR | Status: DC | PRN
  Administered 2018-03-23: 4 mg via INTRAVENOUS

## 2018-03-23 MED ORDER — CEFAZOLIN SODIUM 1 G IJ SOLR
INTRAMUSCULAR | Status: AC
  Filled 2018-03-23: qty 20

## 2018-03-23 MED ORDER — OXYCODONE-ACETAMINOPHEN 5-325 MG PO TABS
1.0000 | ORAL_TABLET | ORAL | Status: DC | PRN
  Administered 2018-03-23 – 2018-03-27 (×7): 2 via ORAL
  Filled 2018-03-23 (×8): qty 2

## 2018-03-23 MED ORDER — ONDANSETRON HCL 4 MG/2ML IJ SOLN
INTRAMUSCULAR | Status: AC
  Filled 2018-03-23: qty 2

## 2018-03-23 MED ORDER — LABETALOL HCL 5 MG/ML IV SOLN: 10.0000 mg | INTRAVENOUS | Status: DC | PRN

## 2018-03-23 MED ORDER — HEPARIN SODIUM (PORCINE) 1000 UNIT/ML IJ SOLN
INTRAMUSCULAR | Status: DC | PRN
  Administered 2018-03-23: 3000 [IU] via INTRAVENOUS
  Administered 2018-03-23: 2000 [IU] via INTRAVENOUS
  Administered 2018-03-23: 11000 [IU] via INTRAVENOUS

## 2018-03-23 MED ORDER — ROCURONIUM BROMIDE 10 MG/ML (PF) SYRINGE
PREFILLED_SYRINGE | INTRAVENOUS | Status: DC | PRN
  Administered 2018-03-23: 50 mg via INTRAVENOUS

## 2018-03-23 MED ORDER — ONDANSETRON HCL 4 MG/2ML IJ SOLN: 4.0000 mg | Freq: Four times a day (QID) | INTRAMUSCULAR | Status: DC | PRN

## 2018-03-23 MED ORDER — CEFAZOLIN SODIUM-DEXTROSE 2-4 GM/100ML-% IV SOLN
2.0000 g | Freq: Three times a day (TID) | INTRAVENOUS | Status: AC
  Administered 2018-03-23 – 2018-03-24 (×2): 2 g via INTRAVENOUS
  Filled 2018-03-23 (×2): qty 100

## 2018-03-23 MED ORDER — PHENOL 1.4 % MT LIQD: 1.0000 | OROMUCOSAL | Status: DC | PRN

## 2018-03-23 MED ORDER — PROMETHAZINE HCL 25 MG/ML IJ SOLN: 6.2500 mg | INTRAMUSCULAR | Status: DC | PRN

## 2018-03-23 MED ORDER — HYDROCHLOROTHIAZIDE 12.5 MG PO CAPS
12.5000 mg | ORAL_CAPSULE | Freq: Every day | ORAL | Status: DC
  Administered 2018-03-23 – 2018-03-27 (×5): 12.5 mg via ORAL
  Filled 2018-03-23 (×5): qty 1

## 2018-03-23 MED ORDER — LACTATED RINGERS IV SOLN
INTRAVENOUS | Status: DC | PRN
  Administered 2018-03-23 (×2): via INTRAVENOUS

## 2018-03-23 MED ORDER — IRBESARTAN 300 MG PO TABS
300.0000 mg | ORAL_TABLET | Freq: Every day | ORAL | Status: DC
  Administered 2018-03-23 – 2018-03-27 (×5): 300 mg via ORAL
  Filled 2018-03-23 (×5): qty 1

## 2018-03-23 MED ORDER — MECLIZINE HCL 25 MG PO TABS: 25.0000 mg | ORAL_TABLET | Freq: Two times a day (BID) | ORAL | Status: DC | PRN

## 2018-03-23 MED ORDER — SODIUM CHLORIDE 0.9 % IV SOLN: INTRAVENOUS | Status: DC

## 2018-03-23 MED ORDER — DEXAMETHASONE SODIUM PHOSPHATE 10 MG/ML IJ SOLN
INTRAMUSCULAR | Status: DC | PRN
  Administered 2018-03-23: 5 mg via INTRAVENOUS

## 2018-03-23 MED ORDER — ALUM & MAG HYDROXIDE-SIMETH 200-200-20 MG/5ML PO SUSP: 15.0000 mL | ORAL | Status: DC | PRN

## 2018-03-23 SURGICAL SUPPLY — 61 items
BANDAGE ESMARK 6X9 LF (GAUZE/BANDAGES/DRESSINGS) ×1 IMPLANT
BNDG ESMARK 6X9 LF (GAUZE/BANDAGES/DRESSINGS) ×2
CANISTER SUCT 3000ML PPV (MISCELLANEOUS) ×2 IMPLANT
CLIP VESOCCLUDE LG 6/CT (CLIP) ×2 IMPLANT
CLIP VESOCCLUDE MED 24/CT (CLIP) ×2 IMPLANT
CLIP VESOCCLUDE SM WIDE 24/CT (CLIP) ×2 IMPLANT
COVER WAND RF STERILE (DRAPES) ×2 IMPLANT
CUFF TOURNIQUET SINGLE 24IN (TOURNIQUET CUFF) IMPLANT
CUFF TOURNIQUET SINGLE 34IN LL (TOURNIQUET CUFF) IMPLANT
CUFF TOURNIQUET SINGLE 44IN (TOURNIQUET CUFF) ×2 IMPLANT
DERMABOND ADVANCED (GAUZE/BANDAGES/DRESSINGS) ×4
DERMABOND ADVANCED .7 DNX12 (GAUZE/BANDAGES/DRESSINGS) ×4 IMPLANT
DRAIN CHANNEL 15F RND FF W/TCR (WOUND CARE) IMPLANT
DRAPE HALF SHEET 40X57 (DRAPES) ×2 IMPLANT
DRAPE X-RAY CASS 24X20 (DRAPES) IMPLANT
ELECT REM PT RETURN 9FT ADLT (ELECTROSURGICAL) ×2
ELECTRODE REM PT RTRN 9FT ADLT (ELECTROSURGICAL) ×1 IMPLANT
EVACUATOR SILICONE 100CC (DRAIN) IMPLANT
GLOVE BIO SURGEON STRL SZ 6 (GLOVE) ×4 IMPLANT
GLOVE BIO SURGEON STRL SZ7.5 (GLOVE) ×6 IMPLANT
GLOVE BIOGEL PI IND STRL 6.5 (GLOVE) ×3 IMPLANT
GLOVE BIOGEL PI IND STRL 8 (GLOVE) ×1 IMPLANT
GLOVE BIOGEL PI INDICATOR 6.5 (GLOVE) ×3
GLOVE BIOGEL PI INDICATOR 8 (GLOVE) ×1
GLOVE ECLIPSE 6.5 STRL STRAW (GLOVE) ×2 IMPLANT
GOWN STRL REUS W/ TWL LRG LVL3 (GOWN DISPOSABLE) ×3 IMPLANT
GOWN STRL REUS W/ TWL XL LVL3 (GOWN DISPOSABLE) ×2 IMPLANT
GOWN STRL REUS W/TWL LRG LVL3 (GOWN DISPOSABLE) ×3
GOWN STRL REUS W/TWL XL LVL3 (GOWN DISPOSABLE) ×2
HEMOSTAT SPONGE AVITENE ULTRA (HEMOSTASIS) IMPLANT
INSERT FOGARTY SM (MISCELLANEOUS) IMPLANT
KIT BASIN OR (CUSTOM PROCEDURE TRAY) ×2 IMPLANT
KIT TURNOVER KIT B (KITS) ×2 IMPLANT
NS IRRIG 1000ML POUR BTL (IV SOLUTION) ×4 IMPLANT
PACK PERIPHERAL VASCULAR (CUSTOM PROCEDURE TRAY) ×2 IMPLANT
PAD ARMBOARD 7.5X6 YLW CONV (MISCELLANEOUS) ×4 IMPLANT
SET COLLECT BLD 21X3/4 12 (NEEDLE) IMPLANT
STOPCOCK 4 WAY LG BORE MALE ST (IV SETS) IMPLANT
SURGICEL SNOW 2X4 (HEMOSTASIS) ×2 IMPLANT
SUT ETHIBOND 5 LR DA (SUTURE) ×2 IMPLANT
SUT ETHILON 3 0 PS 1 (SUTURE) IMPLANT
SUT MNCRL AB 4-0 PS2 18 (SUTURE) ×8 IMPLANT
SUT PROLENE 3 0 SH 48 (SUTURE) ×4 IMPLANT
SUT PROLENE 5 0 C 1 24 (SUTURE) ×2 IMPLANT
SUT PROLENE 6 0 BV (SUTURE) ×10 IMPLANT
SUT PROLENE 7 0 BV 1 (SUTURE) IMPLANT
SUT SILK 0 TIES 10X30 (SUTURE) ×2 IMPLANT
SUT SILK 2 0 PERMA HAND 18 BK (SUTURE) IMPLANT
SUT SILK 3 0 (SUTURE)
SUT SILK 3-0 18XBRD TIE 12 (SUTURE) IMPLANT
SUT SILK 4 0 (SUTURE) ×1
SUT SILK 4-0 18XBRD TIE 12 (SUTURE) ×1 IMPLANT
SUT VIC AB 2-0 CT1 27 (SUTURE) ×5
SUT VIC AB 2-0 CT1 TAPERPNT 27 (SUTURE) ×5 IMPLANT
SUT VIC AB 3-0 SH 27 (SUTURE) ×4
SUT VIC AB 3-0 SH 27X BRD (SUTURE) ×4 IMPLANT
TOWEL GREEN STERILE (TOWEL DISPOSABLE) ×2 IMPLANT
TRAY FOLEY MTR SLVR 16FR STAT (SET/KITS/TRAYS/PACK) ×2 IMPLANT
TUBING EXTENTION W/L.L. (IV SETS) IMPLANT
UNDERPAD 30X30 (UNDERPADS AND DIAPERS) ×2 IMPLANT
WATER STERILE IRR 1000ML POUR (IV SOLUTION) ×2 IMPLANT

## 2018-03-23 NOTE — Anesthesia Postprocedure Evaluation (Signed)
Anesthesia Post Note  Patient: Julian Miranda  Procedure(s) Performed: BYPASS GRAFT RIGHT ABOVE KNEE POPLITEAL TO BELOW KNEE POPLITEAL ARTERY  USING RIGHT GREAT SAPHENOUS VEIN (Right ) VEIN HARVEST RIGHT GREAT SAPHENOUS (Right )     Patient location during evaluation: PACU Anesthesia Type: General Level of consciousness: awake and alert, patient cooperative and oriented Pain management: pain level controlled Vital Signs Assessment: post-procedure vital signs reviewed and stable Respiratory status: spontaneous breathing, nonlabored ventilation and respiratory function stable Cardiovascular status: blood pressure returned to baseline and stable Postop Assessment: no apparent nausea or vomiting Anesthetic complications: no    Last Vitals:  Vitals:   03/23/18 1257 03/23/18 1400  BP: 132/84 139/87  Pulse: 95 97  Resp: 13 17  Temp:  36.8 C  SpO2: 90% 94%    Last Pain:  Vitals:   03/23/18 1428  TempSrc:   PainSc: 8                  Julian Miranda,E. Dorman Calderwood

## 2018-03-23 NOTE — Progress Notes (Signed)
Pt arrived to 4e from [facility] PACU. Pt oriented to room and staff. Vitals obtained. Telemetry applied and CCMD notified x2. CHG bath completed. Right PT and DP pulses dopplered. Family at bedside. Will continue to monitor pt.   Ara Kussmaul BSN, RN

## 2018-03-23 NOTE — Discharge Instructions (Signed)
 [facility] [place]  Discharge instructions  Lower Extremity Bypass Surgery  Please refer to the following instruction for your post-procedure care. Your surgeon or physician assistant will discuss any changes with you.  Activity  You are encouraged to walk as much as you can. You can slowly return to normal activities during the month after your surgery. Avoid strenuous activity and heavy lifting until your doctor tells you it's OK. Avoid activities such as vacuuming or swinging a golf club. Do not drive until your doctor give the OK and you are no longer taking prescription pain medications. It is also normal to have difficulty with sleep habits, eating and bowel movement after surgery. These will go away with time.  Bathing/Showering  Shower daily after you go home. Do not soak in a bathtub, hot tub, or swim until the incision heals completely.  Incision Care  Clean your incision with mild soap and water. Shower every day. Pat the area dry with a clean towel. You do not need a bandage unless otherwise instructed. Do not apply any ointments or creams to your incision. If you have open wounds you will be instructed how to care for them or a visiting nurse may be arranged for you. If you have staples or sutures along your incision they will be removed at your post-op appointment. You may have skin glue on your incision. Do not peel it off. It will come off on its own in about one week.   Diet  Resume your normal diet. There are no special food restrictions following this procedure. A low fat/ low cholesterol diet is recommended for all patients with vascular disease. In order to heal from your surgery, it is CRITICAL to get adequate nutrition. Your body requires vitamins, minerals, and protein. Vegetables are the best source of vitamins and minerals. Vegetables also provide the perfect balance of protein. Processed food has little nutritional value, so try to avoid  this.  Medications  Resume taking all your medications unless your doctor or physician assistant tells you not to. If your incision is causing pain, you may take over-the-counter pain relievers such as acetaminophen (Tylenol). If you were prescribed a stronger pain medication, please aware these medication can cause nausea and constipation. Prevent nausea by taking the medication with a snack or meal. Avoid constipation by drinking plenty of fluids and eating foods with high amount of fiber, such as fruits, vegetables, and grains. Take Colace 100 mg (an over-the-counter stool softener) twice a day as needed for constipation.  Do not take Tylenol if you are taking prescription pain medications.  Follow Up  Our office will schedule a follow up appointment 2-3 weeks following discharge.  Please call us immediately for any of the following conditions  .Severe or worsening pain in your legs or feet while at rest or while walking .Increase pain, redness, warmth, or drainage (pus) from your incision site(s) Fever of 101 degree or higher The swelling in your leg with the bypass suddenly worsens and becomes more painful than when you were in the hospital If you have been instructed to feel your graft pulse then you should do so every day. If you can no longer feel this pulse, call the office immediately. Not all patients are given this instruction.  Leg swelling is common after leg bypass surgery.  The swelling should improve over a few months following surgery. To improve the swelling, you may elevate your legs above the level of your heart while   you are sitting or resting. Your surgeon or physician assistant may ask you to apply an ACE wrap or wear compression (TED) stockings to help to reduce swelling.  Reduce your risk of vascular disease  Stop smoking. If you would like help call [facility] at 1-800-QUIT-NOW (1-800-784-8669) or  at 336-586-4000.  Manage your cholesterol Maintain a  desired weight Control your diabetes weight Control your diabetes Keep your blood pressure down  If you have any questions, please call the office at [phone]   

## 2018-03-23 NOTE — Op Note (Signed)
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  Right distal superficial femoral artery to below-knee popliteal artery bypass with reversed ipsilateral great saphenous vein and exclusion of right popliteal artery aneurysm with ligation 2.  Right great saphenous vein harvest  Surgeon: Dr. Marty Heck, MD  Assistant: Arlee Muslim, PA  Indications: Patient is a 67 year old male who recently presented to clinic as a referral for a right popliteal artery aneurysm.  He was having pain behind his right knee for the last month while driving and an MRI was obtained that noted a greater than 4 cm popliteal aneurysm on the right.  A CTA was then obtained that showed three vessel runoff.  He presents today for right lower extremity bypass with exclusion of popliteal aneurysm after risks and benefits were discussed including bleeding, infection, risk for bypass failure and return to the OR, risk of anesthesia, risk of the aneurysm continuing to expand, etc.  Findings: 4 cm right popliteal artery aneurysm that was excluded with reversed ipsilateral great saphenous vein sewn from the distal right superficial femoral artery to the below-knee popliteal artery tunneled in the anatomic space behind the knee.  The aneurysm was ligated above and below our bypass for exclusion.  Complications: None  Details: The patient was taken to the operating room after informed consent was obtained.  He was placed on the operating table in supine position.  His right leg was then prepped and draped in usual sterile fashion.  Preop timeout was performed to identify patient, procedure, and site.  Initially used ultrasound guidance to identify the great saphenous vein in his right leg that was more robust in the mid to distal thigh.  Initially used skip incisions with a 15 blade scalpel and then using blunt dissection and Bovie cautery  the right great saphenous vein was harvested from below the groin crease to just below the knee.  All side branches were ligated with 4-0 silk ties and small clips and ligated.  The vein was then flushed and passed off the field once it was completely mobilized and each end was ligated with 2-0 silk ties and transected. Then turned our attention to the above-knee popliteal artery where we made a longitudinal incision anterior to the sartorius and dissected down until we identified the above-knee popliteal artery just above the aneurysm.  Unfortunately the artery was heavily calcified here circumferentially as noted on CT scan so we had to extend our dissection cephalad in order to get to a healthier artery that was amenable to suture anastomosis.  As a result we did extend the dissection to the distal superficial femoral artery with a proximal dissection.  Ultimately this artery was then controlled proximally and distally with Vesseloops.  Then turned our attention to the below-knee popliteal artery using our saphenous vein skip incision we then mobilized through this incision and took down the fascia and entered the popliteal space.  The popliteal artery was then mobilized from the joint popliteal veins and controlled proximally and distally with Vesseloops.  At that point in time we then made a tunnel from the below to above-knee popliteal artery with fingers using blunt dissection between the heads of the gastrocnemius muscle and then a long Kelly clamp was passed into the tunnel.  At that point in time the vein was brought back on the field it was reversed flushed and one small branch was repaired with a 4-0 tie.  The  vein was then tunneled in anatomic fashion.  At that point in time the patient was given 11,000 units of IV heparin and ACT was checked to ensure that it was greater than 250.  The vein was then spatulated and sewn to the distal right superficial femoral artery after getting proximal distal  control using end-to-side fashion with a running 5-0 Prolene.  We then tested the bypass distally and there was brisk pulsatile flow.  At that point in time I got distal control and the decided to go ahead and ligate the aneurysm prior to performing the distal anastomosis.  Initially attempted to ligate the above-knee popliteal artery near the aneurysm with a 3-0 Prolene in mattress format.  We also used an Ethibond suture and a 0 silk tie but the artery was heavily calcified and we had trouble getting total occlusion of the artery as result we did use several large vessel clips and then placed another silk tie just distal to our proximal anastomosis on the distal SFA to ensure that we had completely occluded flow where the artery was softer.  We then went to the below-knee popliteal artery just above where anastomosis was going to be and the artery was opened.  I did pass a suction proxiammly into the aneurysm to try and decompress it.  The below knee popliteal artery was then ligated with a 3-0 Prolene as well as a 0 silk tie and a large vessel clip.  At that point time we then straighten the leg and cut the vein to the appropriate length for good tension.  The vein was then spatulated distally and the distal anastomoses were performed with a 6-0 Prolene in running fashion.  There was a palpable dorsalis pedis pulse in the foot with a good posterior tibial signal.  Patient was given 40 mg of protamine for reversal.  All wounds were then irrigated again and we closed all the incisions with a 2-0 Vicryl, 3-0 Vicryl and running 4-0 Monocryl in the skin and Dermabond is applied.  He was awakened from general anesthetic in stable condition and had a palpable posterior tibial and dorsalis pedis pulse in the right foot.  Condition: Stable  Anesthesia: General  Marty Heck, MD [facility] [place] Office: [phone] Pager: [place]

## 2018-03-23 NOTE — H&P (Signed)
History and Physical Interval Note:  03/23/2018 7:18 AM  Julian Miranda  has presented today for surgery, with the diagnosis of right popliteal aneurysm  The various methods of treatment have been discussed with the patient and family. After consideration of risks, benefits and other options for treatment, the patient has consented to  Procedure(s): BYPASS GRAFT POPLITEAL TO POPLITEAL (Right) as a surgical intervention .  The patient's history has been reviewed, patient examined, no change in status, stable for surgery.  I have reviewed the patient's chart and labs.  Questions were answered to the patient's satisfaction.    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  Marty Heck  REASON FOR CONSULT: Right popliteal aneurysm  HPI: Julian Miranda is a 67 y.o. male, with history of coronary artery disease status post PCI, hypertension that presents for evaluation of newly diagnosed right popliteal artery aneurysm.  Patient states he has been having pain behind his right knee for the last month particularly when bending or driving.  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  He presents today for further evaluation.  He states no previous family knowledge of aneurysmal disease.  He has no previous knowledge of an aneurysm.  He denies tobacco abuse.  States he does take an aspirin daily.  He is now retired but very functional and plays golf weekly.  No previous lower extremity interventions.      Past Medical History:  Diagnosis Date  . CAD (coronary artery disease)   . Peripheral vascular disease ([place])   . Stroke [facility])     History reviewed. No pertinent surgical history.       Family History  Problem Relation Age of Onset  . Heart disease Mother     SOCIAL HISTORY: Social History        Socioeconomic History   . Marital status: Married    Spouse name: Not on file  . Number of children: Not on file  . Years of education: Not on file  . Highest education level: Not on file  Occupational History  . Not on file  Social Needs  . Financial resource strain: Not on file  . Food insecurity:    Worry: Not on file    Inability: Not on file  . Transportation needs:    Medical: Not on file    Non-medical: Not on file  Tobacco Use  . Smoking status: Never Smoker  . Smokeless tobacco: Never Used  Substance and Sexual Activity  . Alcohol use: Never    Frequency: Never  . Drug use: Never  . Sexual activity: Not on file  Lifestyle  . Physical activity:    Days per week: Not on file    Minutes per session: Not on file  . Stress: Not on file  Relationships  . Social connections:    Talks on phone: Not on file    Gets together: Not on file    Attends religious service: Not on file    Active member of club or organization: Not on file    Attends meetings of clubs or organizations: Not on file    Relationship status: Not on file  . Intimate partner violence:    Fear of current or ex partner: Not on file    Emotionally abused: Not on file    Physically abused: Not on file  Forced sexual activity: Not on file  Other Topics Concern  . Not on file  Social History Narrative  . Not on file    No Known Allergies        Current Outpatient Medications  Medication Sig Dispense Refill  . ALPRAZolam (XANAX) 0.25 MG tablet     . amLODipine (NORVASC) 10 MG tablet     . aspirin 81 MG tablet Take 81 mg by mouth daily.    Marland Kitchen atorvastatin (LIPITOR) 10 MG tablet     . cloNIDine (CATAPRES) 0.2 MG tablet     . diazepam (VALIUM) 2 MG tablet     . irbesartan-hydrochlorothiazide (AVALIDE) 300-12.5 MG tablet     . meclizine (ANTIVERT) 25 MG tablet Take 25 mg by mouth as needed for dizziness.    Marland Kitchen omeprazole (PRILOSEC) 20 MG capsule      No  current facility-administered medications for this visit.     REVIEW OF SYSTEMS:  [X]  denotes positive finding, [ ]  denotes negative finding Cardiac  Comments:  Chest pain or chest pressure:    Shortness of breath upon exertion:    Short of breath when lying flat:    Irregular heart rhythm:        Vascular    Pain in calf, thigh, or hip brought on by ambulation:    Pain in feet at night that wakes you up from your sleep:     Blood clot in your veins:    Leg swelling:     Pain behind right knee x   Pulmonary    Oxygen at home:    Productive cough:     Wheezing:         Neurologic    Sudden weakness in arms or legs:     Sudden numbness in arms or legs:     Sudden onset of difficulty speaking or slurred speech:    Temporary loss of vision in one eye:     Problems with dizziness:         Gastrointestinal    Blood in stool:     Vomited blood:         Genitourinary    Burning when urinating:     Blood in urine:        Psychiatric    Major depression:         Hematologic    Bleeding problems:    Problems with blood clotting too easily:        Skin    Rashes or ulcers:        Constitutional    Fever or chills:      PHYSICAL EXAM:    Vitals:   03/07/18 1417  BP: (!) 153/90  Pulse: 79  Resp: 18  SpO2: 95%  Weight: 232 lb (105.2 kg)  Height: 5\' 10"  (1.778 m)    GENERAL: The patient is a well-nourished male, in no acute distress. The vital signs are documented above. CARDIAC: There is a regular rate and rhythm.  VASCULAR:  2+ radial pulse palpable bilateral upper extremities 2+ palpable bilateral femoral pulses 2+ left popliteal pulse Bounding right popliteal pulse 2+ palpable DP/PT bilateral feet, no tissue loss PULMONARY: There is good air exchange bilaterally without wheezing or rales. ABDOMEN: Soft and non-tender with normal pitched bowel sounds.  No  appreciable aneurysm. MUSCULOSKELETAL: There are no major deformities or cyanosis. NEUROLOGIC: No focal weakness or paresthesias are detected. SKIN: There are no ulcers or rashes noted. PSYCHIATRIC: The patient has  a normal affect.  DATA:   I independently reviewed his MRI which does show an approximate 4 cm popliteal aneurysm on the right with evidence of laminar thrombus - limited study and unable to evaluate run-off or full extent of aneurysm.  Assessment/Plan:  I had a long discussion with Mr. Shouse regarding the etiology and nature of popliteal artery aneurysms.  Discussed that we typically repair popliteal aneurysms greater than 2 cm given the risk for thrombosis and/or embolism.  Given his aneurysm is 4 cm on MRI I think he certainly merits repair.  Unfortunately the MRI that was obtained is somewhat limited for operative planning.  I have subsequently recommended a CTA abdomen pelvis with bilateral lower extremity runoff.  This will allow [place] to further evaluate his right popliteal aneurysm and discussed options including endovascular covered stent versus bypass either through a medial versus posterior approach.  In addition we can rule out any evidence of an associated abdominal aortic aneurysm or contralateral popliteal artery aneurysm that are frequently associated.  I will go ahead and schedule him for surgery on 03/27/2018 for right popliteal aneurysm repair with tentative plans for bypass and exclusion.  Specific plans will change according to CTA results and vein mapping.   Given the large size of his aneurysm and the fact that there is thrombus within the aneurysm I also put him on 5 mg of Eliquis twice daily while we obtain further imaging for work-up and while awaiting surgical intervention.  I will contact him after review of CT and vein mapping for final plans.   Marty Heck, MD [facility] [place] Office: [phone] Pager: [phone]

## 2018-03-23 NOTE — Anesthesia Procedure Notes (Addendum)
Procedure Name: Intubation Date/Time: 03/23/2018 7:41 AM Performed by: Moshe Salisbury, CRNA Pre-anesthesia Checklist: Patient identified, Emergency Drugs available, Suction available and Patient being monitored Patient Re-evaluated:Patient Re-evaluated prior to induction Oxygen Delivery Method: Circle System Utilized Preoxygenation: Pre-oxygenation with 100% oxygen Induction Type: IV induction Ventilation: Mask ventilation without difficulty Laryngoscope Size: Mac and 4 Grade View: Grade II Tube type: Oral Tube size: 8.0 mm Number of attempts: 3 (Grade II view by DB but unable to pass ETT through vc, Grade II view by CJ and unable to pass through vc, Grade II view by CJ, bougie stylet inserted through vc and ETT inserted over stylet.) Airway Equipment and Method: Stylet and Bougie stylet Placement Confirmation: ETT inserted through vocal cords under direct vision,  positive ETCO2 and breath sounds checked- equal and bilateral Secured at: 22 cm Tube secured with: Tape Dental Injury: Teeth and Oropharynx as per pre-operative assessment and Injury to lip  Comments: Small cut noted in middle of upper lip after intubation. Rolled moist gauze applied to site.

## 2018-03-23 NOTE — Transfer of Care (Signed)
Immediate Anesthesia Transfer of Care Note  Patient: Julian Miranda  Procedure(s) Performed: BYPASS GRAFT RIGHT ABOVE KNEE POPLITEAL TO BELOW KNEE POPLITEAL ARTERY  USING RIGHT GREAT SAPHENOUS VEIN (Right ) VEIN HARVEST RIGHT GREAT SAPHENOUS (Right )  Patient Location: PACU  Anesthesia Type:General  Level of Consciousness: awake and patient cooperative  Airway & Oxygen Therapy: Patient Spontanous Breathing and Patient connected to nasal cannula oxygen  Post-op Assessment: Report given to RN, Post -op Vital signs reviewed and stable and Patient moving all extremities  Post vital signs: Reviewed and stable  Last Vitals:  Vitals Value Taken Time  BP    Temp    Pulse 96 03/23/2018 12:23 PM  Resp 12 03/23/2018 12:23 PM  SpO2 96 % 03/23/2018 12:23 PM  Vitals shown include unvalidated device data.  Last Pain:  Vitals:   03/23/18 0600  PainSc: 4       Patients Stated Pain Goal: 3 (41/93/79 0240)  Complications: No apparent anesthesia complications

## 2018-03-23 NOTE — Progress Notes (Signed)
  Day of Surgery Note    Subjective:  No complaints   Vitals:   03/23/18 1245 03/23/18 1257  BP:  132/84  Pulse: 93 95  Resp: 19 13  Temp:    SpO2: 92% 90%    Incisions:   All are clean and dry  Extremities:  palpable right DP pulse as well as doppler signal right PT Cardiac:  regular Lungs:  Non labored    Assessment/Plan:  This is a 67 y.o. male who is s/p  1.  Right distal superficial femoral artery to below-knee popliteal artery bypass with reversed ipsilateral great saphenous vein and exclusion of right popliteal artery aneurysm with ligation 2.  Right great saphenous vein harvest  -pt with palpable right DP pulse as well as doppler signal right PT -to 4 east when bed available   Leontine Locket, PA-C 03/23/2018 1:07 PM [phone]

## 2018-03-24 ENCOUNTER — Inpatient Hospital Stay (HOSPITAL_COMMUNITY): Payer: Medicare Other

## 2018-03-24 ENCOUNTER — Encounter (HOSPITAL_COMMUNITY): Payer: Self-pay | Admitting: Vascular Surgery

## 2018-03-24 DIAGNOSIS — I739 Peripheral vascular disease, unspecified: Secondary | ICD-10-CM

## 2018-03-24 LAB — BASIC METABOLIC PANEL
Anion gap: 14 (ref 5–15)
BUN: 13 mg/dL (ref 8–23)
CO2: 27 mmol/L (ref 22–32)
CREATININE: 0.99 mg/dL (ref 0.61–1.24)
Calcium: 8.7 mg/dL — ABNORMAL LOW (ref 8.9–10.3)
Chloride: 97 mmol/L — ABNORMAL LOW (ref 98–111)
GFR calc Af Amer: >60 mL/min (ref >60)
Glucose, Bld: 131 mg/dL — ABNORMAL HIGH (ref 70–99)
Potassium: 4.2 mmol/L (ref 3.5–5.1)
Sodium: 138 mmol/L (ref 135–145)

## 2018-03-24 LAB — CBC
HCT: 39.1 % (ref 39.0–52.0)
Hemoglobin: 12.9 g/dL — ABNORMAL LOW (ref 13.0–17.0)
MCH: 31.2 pg (ref 26.0–34.0)
MCHC: 33 g/dL (ref 30.0–36.0)
MCV: 94.7 fL (ref 80.0–100.0)
Platelets: 232 10*3/uL (ref 150–400)
RBC: 4.13 MIL/uL — ABNORMAL LOW (ref 4.22–5.81)
RDW: 13 % (ref 11.5–15.5)
WBC: 9.9 10*3/uL (ref 4.0–10.5)
nRBC: 0 % (ref 0.0–0.2)

## 2018-03-24 MED ORDER — OXYCODONE-ACETAMINOPHEN 5-325 MG PO TABS: 1.0000 | ORAL_TABLET | Freq: Four times a day (QID) | ORAL | 0 refills | Status: DC | PRN

## 2018-03-24 NOTE — Discharge Summary (Signed)
Physician Discharge Summary   Patient ID: Julian Miranda [id number] 67 y.o. [birth year]  Admit date: 03/23/2018  Discharge date and time: 03/27/18   Admitting Physician: Marty Heck, MD   Discharge Physician: same  Admission Diagnoses: right popliteal aneurysm  Discharge Diagnoses: same  Admission Condition: fair  Discharged Condition: fair  Indication for Admission: large 4.2cm R popliteal artery aneurysm  Hospital Course: Julian Miranda is a 67 year old male who was brought in as an outpatient for right distal SFA to below the knee popliteal artery bypass with vein and exclusion of popliteal artery aneurysm with ligation by Dr. Carlis Abbott on 03/23/2018.  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  He tolerated the procedure well has been was admitted to the hospital postoperatively.  For about 24 to 48 hours postoperatively he required a morphine PCA pump for pain control.  He was evaluated by physical therapy and was cleared for discharge home without active follow-up.  Much of the hospital stay involved increasing mobility and pain control.  It should be noted that he maintained a brisk DP and PT signal by Doppler throughout postoperative hospital stay.  We will discontinue his Eliquis at discharge as this was prescribed by Dr. Carlis Abbott as a preventative measure for embolic disease from right popliteal artery aneurysm.  He will need to continue aspirin and statin.  He will follow-up in office to see Dr. Carlis Abbott in about 2 weeks.  He will be prescribed 2 to 3 days of narcotic pain medication for continued postoperative pain control.  Discharge instructions were reviewed with the patient and his wife and they both voiced their understanding.  He will be discharged to home in stable condition.  Consults: None  Treatments: surgery by Dr. Carlis Abbott 03/23/18: Right distal SFA to below the knee popliteal artery bypass with vein and exclusion of popliteal artery aneurysm  with ligation.  Discharge Exam: see progress note 03/27/18 Vitals:   03/27/18 0835 03/27/18 1008  BP: 111/77 (!) 97/56  Pulse: 92 85  Resp: 15 17  Temp:  97.8 F (36.6 C)  SpO2: 92% 91%    Disposition:  home  - For VQI Registry use ---  Post-op:  Wound infection: No  Graft infection: No  Transfusion: No   New Arrhythmia: No Patency judged by: [x ] Dopper only, [ ]  Palpable graft pulse, [ ]  Palpable distal pulse, [ ]  ABI inc. > 0.15, [ ]  Duplex D/C Ambulatory Status: Ambulatory  Complications: MI: [ ]  No, [ ]  Troponin only, [ ]  EKG or Clinical CHF: No Resp failure: [ ]  none, [ ]  Pneumonia, [ ]  Ventilator Chg in renal function: [ ]  none, [ ]  Inc. Cr > 0.5, [ ]  Temp. Dialysis, [ ]  Permanent dialysis Stroke: [ ]  None, [ ]  Minor, [ ]  Major Return to OR: No  Reason for return to OR: [ ]  Bleeding, [ ]  Infection, [ ]  Thrombosis, [ ]  Revision  Discharge medications: Statin use:  Yes ASA use:  Yes Plavix use:  No  for medical reason not indicated Beta blocker use: Yes Coumadin use: No  for medical reason not indicated    Patient Instructions:  Allergies as of 03/27/2018   No Known Allergies     Medication List    STOP taking these medications   apixaban 5 MG Tabs tablet Commonly known as:  ELIQUIS     TAKE these medications   ALPRAZolam 0.25 MG tablet Commonly known as:  XANAX Take 0.25 mg  by mouth 4 (four) times daily.   amLODipine 10 MG tablet Commonly known as:  NORVASC Take 10 mg by mouth at bedtime.   aspirin 81 MG tablet Take 81 mg by mouth daily.   atorvastatin 10 MG tablet Commonly known as:  LIPITOR Take 10 mg by mouth at bedtime.   CALCIUM/MAGNESIUM/ZINC PO Take 1 tablet by mouth at bedtime.   cloNIDine 0.2 MG tablet Commonly known as:  CATAPRES Take 0.2 mg by mouth 2 (two) times daily.   colchicine 0.6 MG tablet Take 1 tablet (0.6 mg total) by mouth daily.   diazepam 2 MG tablet Commonly known as:  VALIUM Take 2 mg by mouth 2 (two)  times daily as needed (dizziness).   irbesartan-hydrochlorothiazide 300-12.5 MG tablet Commonly known as:  AVALIDE Take 1 tablet by mouth daily.   meclizine 25 MG tablet Commonly known as:  ANTIVERT Take 25 mg by mouth 2 (two) times daily as needed for dizziness.   metoprolol succinate 25 MG 24 hr tablet Commonly known as:  TOPROL-XL Take 25 mg by mouth 2 (two) times daily.   omeprazole 20 MG capsule Commonly known as:  PRILOSEC Take 20 mg by mouth 2 (two) times daily.   oxyCODONE-acetaminophen 5-325 MG tablet Commonly known as:  PERCOCET/ROXICET Take 1 tablet by mouth every 6 (six) hours as needed for moderate pain.      Activity: activity as tolerated Diet: regular diet Wound Care: keep wound clean and dry  Follow-up with Dr. Carlis Abbott in 2 weeks.  SignedDagoberto Ligas 03/28/2018 8:49 AM

## 2018-03-24 NOTE — Progress Notes (Addendum)
  Progress Note    03/24/2018 7:23 AM 1 Day Post-Op  Subjective:  Soreness overnight from incisions RLE.  Denies rest pain.   Vitals:   03/23/18 2357 03/24/18 0332  BP: 118/71 118/72  Pulse:  94  Resp: (!) 9 12  Temp:  97.8 F (36.6 C)  SpO2: 90% 97%   Physical Exam: Lungs:  Non labored Incisions:  4 incisions medial RLE; no drainage, no palpable hematoma Extremities:  Palpable R DP pulse, unable to palpate R PT Abdomen:  Soft Neurologic: A&O  CBC    Component Value Date/Time   WBC 9.9 03/24/2018 0405   RBC 4.13 (L) 03/24/2018 0405   HGB 12.9 (L) 03/24/2018 0405   HCT 39.1 03/24/2018 0405   PLT 232 03/24/2018 0405   MCV 94.7 03/24/2018 0405   MCH 31.2 03/24/2018 0405   MCHC 33.0 03/24/2018 0405   RDW 13.0 03/24/2018 0405    BMET    Component Value Date/Time   NA 138 03/24/2018 0405   K 4.2 03/24/2018 0405   CL 97 (L) 03/24/2018 0405   CO2 27 03/24/2018 0405   GLUCOSE 131 (H) 03/24/2018 0405   BUN 13 03/24/2018 0405   CREATININE 0.99 03/24/2018 0405   CALCIUM 8.7 (L) 03/24/2018 0405   GFRNONAA >60 03/24/2018 0405   GFRAA >60 03/24/2018 0405    INR    Component Value Date/Time   INR 1.03 03/23/2018 0629     Intake/Output Summary (Last 24 hours) at 03/24/2018 0723 Last data filed at 03/24/2018 0617 Gross per 24 hour  Intake 1945.42 ml  Output 2675 ml  Net -729.58 ml     Assessment/Plan:  67 y.o. male is s/p SFA to pop bypass to exclude pop aneurysm 1 Day Post-Op   Perfusing RLE well with palpable R DP Encouraged OOB and increase mobility today Wean PCA Home when mobility increased and pain better controlled   Dagoberto Ligas, PA-C [facility] [phone] 03/24/2018 7:23 AM  I have seen and evaluated the patient. I agree with the PA note as documented above. POD#1 s/p right popliteal aneurysm repair.  Palpable DP/PT pulses in RLE and foot warm.  Incisions c/d/i.  OOB to chair today and start PT.  Will slowly wean PCA  over next 24 hours.  Marty Heck, MD [facility] [place] Office: [phone] Pager: [phone]

## 2018-03-24 NOTE — Progress Notes (Addendum)
PT Cancellation Note  Patient Details Name: Julian Miranda MRN: [id number] DOB: [birth year]   Cancelled Treatment:     Pt out of room with ABI procedure.   Addendum 1550: x2 attempt to see pt unsuccessful. Pt unable to stand with OT previously and requesting PT evaluation hold until tomorrow.   Ellamae Sia, PT, DPT Acute Rehabilitation Services Pager [phone] Office [phone]    Willy Eddy 03/24/2018, 12:48 PM

## 2018-03-24 NOTE — Progress Notes (Signed)
PCA morphine syringe wasted 3 ml in stericycle.  Witnessed by Ander Purpura, RN

## 2018-03-24 NOTE — Progress Notes (Signed)
Post op ABI/TBI evaluation completed. Please see preliminary notes on CV PROC under chart review.  Wayden Schwertner H Savyon Loken(RDMS RVT) 03/24/18 11:41 AM

## 2018-03-24 NOTE — Evaluation (Signed)
Occupational Therapy Evaluation Patient Details Name: Julian Miranda MRN: [id number] DOB: [birth year] Today's Date: 03/24/2018    History of Present Illness [name] is a 67 y.o. male, with history of coronary artery disease status post PCI, hypertension. Pt s/p ABI   Clinical Impression   Eval limited due to pain in R LE. Pt with PCA and reports 10/10 R LE pain with movement. Pt demonstrates decline in function and safety with ADLs and ADL mobility with decreased balance and endurance. Pt sat EOB with min A with R LE, however was unable to stand from EOB with RW due to R LE pain and educated in using urinal at EOB. Pt sat EOB x 20 minutes fro activity. Pt would benefit from acute OT services to address impairments to maximize level of function and safety    Follow Up Recommendations  Home health OT    Equipment Recommendations  3 in 1 bedside commode;Tub/shower seat;Other (comment);Hospital bed(LH bathe sponge)    Recommendations for Other Services       Precautions / Restrictions Precautions Precautions: Fall Precaution Comments: R LE incisions Restrictions Weight Bearing Restrictions: No Other Position/Activity Restrictions: No climbing stairs x 30 days per MD       Mobility Bed Mobility Overal bed mobility: Needs Assistance Bed Mobility: Supine to Sit;Sit to Supine     Supine to sit: Min assist Sit to supine: Mod assist   General bed mobility comments: min A with R LE to EOB and mod A back onto bed  Transfers                 General transfer comment: unable due to R LE pain    Balance Overall balance assessment: Needs assistance Sitting-balance support: Feet supported;No upper extremity supported Sitting balance-Leahy Scale: Good Sitting balance - Comments: pt sat EOB x 20 minutes       Standing balance comment: unable                           ADL either performed or assessed with clinical judgement   ADL Overall ADL's : Needs  assistance/impaired Eating/Feeding: Independent;Sitting   Grooming: Wash/dry hands;Wash/dry face;Brushing hair;Sitting;Set up;With caregiver independent assisting   Upper Body Bathing: Set up;Sitting;With caregiver independent assisting   Lower Body Bathing: Moderate assistance;Sitting/lateral leans;With caregiver independent assisting   Upper Body Dressing : Set up;Sitting;With caregiver independent assisting   Lower Body Dressing: Total assistance;With caregiver independent assisting     Toilet Transfer Details (indicate cue type and reason): pt unable to stand due to R LE pain           General ADL Comments: pt limited due to R LE pain     Vision Baseline Vision/History: Wears glasses Wears Glasses: Reading only Patient Visual Report: No change from baseline       Perception     Praxis      Pertinent Vitals/Pain Pain Assessment: 0-10 Pain Score: 10-Worst pain ever Pain Location: 6/10 at rest, 10/10 after attempting sit - stand for EOB Pain Descriptors / Indicators: Burning;Sore;Tingling Pain Intervention(s): Limited activity within patient's tolerance;Monitored during session;Premedicated before session;Repositioned;PCA encouraged     Hand Dominance Right   Extremity/Trunk Assessment Upper Extremity Assessment Upper Extremity Assessment: Overall WFL for tasks assessed   Lower Extremity Assessment Lower Extremity Assessment: Defer to PT evaluation       Communication Communication Communication: No difficulties   Cognition Arousal/Alertness: Awake/alert Behavior During Therapy: WFL for tasks assessed/performed Overall  Cognitive Status: Within Functional Limits for tasks assessed                                     General Comments       Exercises     Shoulder Instructions      Home Living Family/patient expects to be discharged to:: Private residence Living Arrangements: Spouse/significant other Available Help at Discharge:  Family Type of Home: House Home Access: Stairs to enter [employer] of Steps: 2   Home Layout: Two level;Able to live on main level with bedroom/bathroom;Full bath on main level     Bathroom Shower/Tub: Occupational psychologist: Standard     Home Equipment: Cane - single point;Adaptive equipment Adaptive Equipment: Reacher        Prior Functioning/Environment Level of Independence: Independent                 OT Problem List: Decreased activity tolerance;Decreased knowledge of use of DME or AE;Increased edema;Pain;Impaired balance (sitting and/or standing)      OT Treatment/Interventions: Self-care/ADL training;Therapeutic exercise;DME and/or AE instruction;Therapeutic activities;Patient/family education    OT Goals(Current goals can be found in the care plan section) Acute Rehab OT Goals Patient Stated Goal: "get over this pain and go home" OT Goal Formulation: With patient/family Time For Goal Achievement: 04/07/18 Potential to Achieve Goals: Good ADL Goals Pt Will Perform Grooming: with min guard assist;with supervision;with set-up;standing;with caregiver independent in assisting Pt Will Perform Lower Body Bathing: with mod assist;with min assist;with caregiver independent in assisting Pt Will Perform Lower Body Dressing: with max assist;with mod assist;with caregiver independent in assisting Pt Will Transfer to Toilet: with mod assist;ambulating;bedside commode;grab bars Pt Will Perform Toileting - Clothing Manipulation and hygiene: with mod assist;with caregiver independent in assisting;sit to/from stand Pt Will Perform Tub/Shower Transfer: with mod assist;ambulating;3 in 1;grab bars;rolling walker;with caregiver independent in assisting  OT Frequency: Min 2X/week   Barriers to D/C:    no barriers, will have 24/7 assist at home       Co-evaluation              AM-PAC OT "6 Clicks" Daily Activity     Outcome Measure Help from another  person eating meals?: None Help from another person taking care of personal grooming?: None Help from another person toileting, which includes using toliet, bedpan, or urinal?: A Lot Help from another person bathing (including washing, rinsing, drying)?: A Lot Help from another person to put on and taking off regular upper body clothing?: None Help from another person to put on and taking off regular lower body clothing?: Total 6 Click Score: 17   End of Session Equipment Utilized During Treatment: Rolling walker Nurse Communication: Mobility status  Activity Tolerance: Patient limited by pain Patient left: in bed;with call bell/phone within reach;with family/visitor present  OT Visit Diagnosis: Other abnormalities of gait and mobility (R26.89);Muscle weakness (generalized) (M62.81);Pain Pain - Right/Left: Right Pain - part of body: Leg                Time: 6301-6010 OT Time Calculation (min): 35 min Charges:  OT General Charges $OT Visit: 1 Visit OT Evaluation $OT Eval Moderate Complexity: 1 Mod OT Treatments $Therapeutic Activity: 8-22 mins    Britt Bottom 03/24/2018, 2:27 PM

## 2018-03-25 NOTE — Evaluation (Signed)
Physical Therapy Evaluation Patient Details Name: Julian Miranda MRN: [id number] DOB: July 21, 1950 Today's Date: 03/25/2018   History of Present Illness  Julian Miranda is a 67 y.o. male, with history of coronary artery disease status post PCI, hypertension. Pt s/p RLE bypass with popliteal aneurysm exclusion.  Clinical Impression  Pt admitted with above diagnosis. Pt currently with functional limitations due to the deficits listed below (see PT Problem List). Prior to admission, pt independent with mobility and ADL's. On PT evaluation, patient limited by pain, decreased activity tolerance, and difficulty walking. Able to ambulate pivotal steps from bed to chair using walker and min guard assist, with progressive RLE weightbearing. Suspect patient will progress well, just needs encouragement. Pt will benefit from skilled PT to increase their independence and safety with mobility to allow discharge to the venue listed below.       Follow Up Recommendations Home health PT;Supervision for mobility/OOB    Equipment Recommendations  3in1 (PT);Hospital bed;Rolling walker with 5" wheels    Recommendations for Other Services       Precautions / Restrictions Precautions Precautions: Fall Restrictions Weight Bearing Restrictions: No Other Position/Activity Restrictions: No climbing stairs x 30 days per MD       Mobility  Bed Mobility Overal bed mobility: Needs Assistance Bed Mobility: Supine to Sit     Supine to sit: Supervision     General bed mobility comments: cues for technique  Transfers Overall transfer level: Needs assistance Equipment used: Rolling walker (2 wheeled) Transfers: Sit to/from Stand Sit to Stand: Min assist         General transfer comment: light min assist to boost up to standing from elevated bed  Ambulation/Gait Ambulation/Gait assistance: Min guard   Assistive device: Rolling walker (2 wheeled) Gait Pattern/deviations: Step-to  pattern;Antalgic;Decreased weight shift to right     General Gait Details: Pt able to take pivotal steps from bed to chair. Cues provided for sequencing and pt able to increase RLE weightbearing  Stairs            Wheelchair Mobility    Modified Rankin (Stroke Patients Only)       Balance Overall balance assessment: Needs assistance Sitting-balance support: Feet supported;No upper extremity supported Sitting balance-Leahy Scale: Good     Standing balance support: Bilateral upper extremity supported Standing balance-Leahy Scale: Poor                               Pertinent Vitals/Pain Pain Assessment: Faces Faces Pain Scale: Hurts whole lot Pain Descriptors / Indicators: Burning;Sore;Tingling Pain Intervention(s): Limited activity within patient's tolerance;Monitored during session;PCA encouraged    Home Living Family/patient expects to be discharged to:: Private residence Living Arrangements: Spouse/significant other Available Help at Discharge: Family Type of Home: House Home Access: Stairs to enter   Technical brewer of Steps: 2 Home Layout: Two level;Able to live on main level with bedroom/bathroom;Full bath on main level Home Equipment: Cane - single point;Adaptive equipment      Prior Function Level of Independence: Independent               Hand Dominance   Dominant Hand: Right    Extremity/Trunk Assessment   Upper Extremity Assessment Upper Extremity Assessment: Overall WFL for tasks assessed    Lower Extremity Assessment Lower Extremity Assessment: RLE deficits/detail;LLE deficits/detail RLE Deficits / Details: expected post op deficits. strength at least anti gravity LLE Deficits / Details: Strength 5/5  Communication   Communication: No difficulties  Cognition Arousal/Alertness: Awake/alert Behavior During Therapy: WFL for tasks assessed/performed Overall Cognitive Status: Within Functional Limits for tasks  assessed                                        General Comments      Exercises General Exercises - Lower Extremity Ankle Circles/Pumps: 20 reps;Both;Seated Quad Sets: 10 reps;Both;Seated Long Arc Quad: Right;5 reps;Seated   Assessment/Plan    PT Assessment Patient needs continued PT services  PT Problem List Decreased strength;Decreased activity tolerance;Decreased balance;Decreased mobility;Pain       PT Treatment Interventions DME instruction;Gait training;Functional mobility training;Therapeutic activities;Therapeutic exercise;Balance training;Patient/family education    PT Goals (Current goals can be found in the Care Plan section)  Acute Rehab PT Goals Patient Stated Goal: "get over this pain and go home" PT Goal Formulation: With patient/family Time For Goal Achievement: 04/08/18 Potential to Achieve Goals: Good    Frequency Min 3X/week   Barriers to discharge        Co-evaluation               AM-PAC PT "6 Clicks" Mobility  Outcome Measure Help needed turning from your back to your side while in a flat bed without using bedrails?: None Help needed moving from lying on your back to sitting on the side of a flat bed without using bedrails?: None Help needed moving to and from a bed to a chair (including a wheelchair)?: A Little Help needed standing up from a chair using your arms (e.g., wheelchair or bedside chair)?: A Little Help needed to walk in hospital room?: A Little Help needed climbing 3-5 steps with a railing? : A Lot 6 Click Score: 19    End of Session Equipment Utilized During Treatment: Gait belt;Oxygen Activity Tolerance: Patient tolerated treatment well Patient left: in chair;with call bell/phone within reach;with family/visitor present Nurse Communication: Mobility status PT Visit Diagnosis: Other abnormalities of gait and mobility (R26.89);Difficulty in walking, not elsewhere classified (R26.2);Pain Pain - Right/Left:  Right Pain - part of body: Leg    Time: 0810-0829 PT Time Calculation (min) (ACUTE ONLY): 19 min   Charges:   PT Evaluation $PT Eval Moderate Complexity: 1 Mod          Ellamae Sia, [place], DPT Acute Rehabilitation Services Pager [phone] Office [phone]   Willy Eddy 03/25/2018, 9:20 AM

## 2018-03-25 NOTE — Progress Notes (Addendum)
[facility]  Subjective  - Doing better voided independently.   Objective 105/65 82 97.9 F (36.6 C) (Oral) 16 95%  Intake/Output Summary (Last 24 hours) at 03/25/2018 0706 Last data filed at 03/25/2018 0523 Gross per 24 hour  Intake 1307 ml  Output 2200 ml  Net -893 ml    Right LE incisions healing well Palpable pedal -pulses B LE Heart RRR Lungs O2 support SAT 95 % PCA   ABI Findings:03/24/2018 +---------+------------------+-----+---------+--------+ Right    Rt Pressure (mmHg)IndexWaveform Comment  +---------+------------------+-----+---------+--------+ Brachial 116                    triphasic         +---------+------------------+-----+---------+--------+ PTA      97                0.77 biphasic          +---------+------------------+-----+---------+--------+ DP       100               0.79 triphasic         +---------+------------------+-----+---------+--------+ Great Toe72                0.57                   +---------+------------------+-----+---------+--------+  +---------+------------------+-----+---------+-------+ Left     Lt Pressure (mmHg)IndexWaveform Comment +---------+------------------+-----+---------+-------+ Brachial 126                    triphasic        +---------+------------------+-----+---------+-------+ PTA      136               1.08 triphasic        +---------+------------------+-----+---------+-------+ DP       127               1.01 triphasic        +---------+------------------+-----+---------+-------+ Great Toe99                0.79                  +---------+------------------+-----+---------+-------+  +-------+-----------+-----------+------------+------------+ ABI/TBIToday's ABIToday's TBIPrevious ABIPrevious TBI +-------+-----------+-----------+------------+------------+ Right  0.79       0.57                                 +-------+-----------+-----------+------------+------------+ Left   1.08       0.79                                +-------+-----------+-----------+------------+------------+   Assessment/Planning: POD # 2 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  PT/OT for mobility When pain controlled and ambulating plan to discharge  Roxy Horseman 03/25/2018 7:06 AM --  Laboratory Lab Results: Recent Labs    03/23/18 1442 03/24/18 0405  WBC 12.4* 9.9  HGB 15.5 12.9*  HCT 45.2 39.1  PLT 234 232   BMET Recent Labs    03/23/18 1442 03/24/18 0405  NA  --  138  K  --  4.2  CL  --  97*  CO2  --  27  GLUCOSE  --  131*  BUN  --  13  CREATININE 1.02 0.99  CALCIUM  --  8.7*    COAG Lab Results  Component Value Date   INR 1.03 03/23/2018  INR 1.18 03/17/2018   No results found for: PTT  I have seen and evaluated the patient. I agree with the PA note as documented above. Palpable R DP/PT pulses.  Incisions look great.  OOB and voided yesterday.  Will attempt to wean PCA.  Needs to work with PT and start mobility.  Marty Heck, MD [facility] Office: [phone] Pager: [phone]

## 2018-03-25 NOTE — Plan of Care (Signed)
  Problem: Education: Goal: Knowledge of General Education information will improve Description: Including pain rating scale, medication(s)/side effects and non-pharmacologic comfort measures Outcome: Progressing   Problem: Clinical Measurements: Goal: Will remain free from infection Outcome: Progressing Goal: Diagnostic test results will improve Outcome: Progressing Goal: Cardiovascular complication will be avoided Outcome: Progressing   Problem: Activity: Goal: Risk for activity intolerance will decrease Outcome: Progressing   

## 2018-03-26 LAB — POCT ACTIVATED CLOTTING TIME
Activated Clotting Time: 268 seconds
Activated Clotting Time: 224 seconds
Activated Clotting Time: 241 seconds
Activated Clotting Time: 120 seconds

## 2018-03-26 LAB — URIC ACID: Uric Acid, Serum: 6.5 mg/dL (ref 3.7–8.6)

## 2018-03-26 MED ORDER — MORPHINE SULFATE (PF) 2 MG/ML IV SOLN
1.0000 mg | INTRAVENOUS | Status: DC | PRN
  Administered 2018-03-26 – 2018-03-27 (×3): 1 mg via INTRAVENOUS
  Filled 2018-03-26 (×3): qty 1

## 2018-03-26 MED ORDER — COLCHICINE 0.6 MG PO TABS
0.6000 mg | ORAL_TABLET | Freq: Every day | ORAL | Status: DC
  Administered 2018-03-26 – 2018-03-27 (×2): 0.6 mg via ORAL
  Filled 2018-03-26 (×2): qty 1

## 2018-03-26 MED ORDER — BISACODYL 5 MG PO TBEC
5.0000 mg | DELAYED_RELEASE_TABLET | Freq: Every day | ORAL | Status: DC | PRN
  Administered 2018-03-26: 5 mg via ORAL
  Filled 2018-03-26: qty 1

## 2018-03-26 NOTE — Progress Notes (Addendum)
[facility]  Subjective  - Acute right ankle pain.   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  Right LE with patent blood flow and palpable DP  Right acute ankle pain work up for gout.  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  I ordered serum uric acid and will start him on Colchicine. Encourage mobility.  D/C PCA.  Roxy Horseman 03/26/2018 8:28 AM --  Laboratory Lab Results: Recent Labs    03/23/18 1442 03/24/18 0405  WBC 12.4* 9.9  HGB 15.5 12.9*  HCT 45.2 39.1  PLT 234 232   BMET Recent Labs    03/23/18 1442 03/24/18 0405  NA  --  138  K  --  4.2  CL  --  97*  CO2  --  27  GLUCOSE  --  131*  BUN  --  13  CREATININE 1.02 0.99  CALCIUM  --  8.7*    COAG Lab Results  Component Value Date   INR 1.03 03/23/2018   INR 1.18 03/17/2018   No results found for: PTT  I have seen and evaluated the patient. I agree with the PA note as documented above. Complains of right ankle pain today that prevented him from walking.  Palpable R DP pulse.  Calf soft.  All incisions c/d/i.  Fairly unremarkable exam other than tenderness.  Will check uric acid level today and start cholchicine.  Marty Heck, MD [facility] Office: [phone] Pager: [phone]

## 2018-03-26 NOTE — Progress Notes (Signed)
Physical Therapy Treatment Patient Details Name: Julian Miranda MRN: [id number] DOB: 11-Jan-1951 Today's Date: 03/26/2018    History of Present Illness Julian Miranda is a 67 y.o. male, with history of coronary artery disease status post PCI, hypertension. Pt s/p RLE bypass with popliteal aneurysm exclusion.    PT Comments    Patient progressing towards physical therapy goals, however, does note new pain distal to medial malleolus which is tender to palpation but not reproduced with passive or active ankle dorsiflexion. Ankle pain does not increase with weightbearing. Session focused on therapeutic exercises for strengthening and gait training. Pt with increased ambulation distance to 15 feet with walker and min guard assist. Will progress mobility as tolerated.    Follow Up Recommendations  Home health PT;Supervision for mobility/OOB     Equipment Recommendations  3in1 (PT);Hospital bed;Rolling walker with 5" wheels    Recommendations for Other Services       Precautions / Restrictions Precautions Precautions: Fall Restrictions Weight Bearing Restrictions: No Other Position/Activity Restrictions: No climbing stairs x 30 days per MD     Mobility  Bed Mobility               General bed mobility comments: OOB in chair  Transfers Overall transfer level: Needs assistance Equipment used: Rolling walker (2 wheeled) Transfers: Sit to/from Stand Sit to Stand: Min assist         General transfer comment: light min assist to boost up.cues for technique and hand positioning  Ambulation/Gait Ambulation/Gait assistance: Min guard Gait Distance (Feet): 15 Feet Assistive device: Rolling walker (2 wheeled) Gait Pattern/deviations: Antalgic;Decreased weight shift to right;Step-through pattern;Decreased step length - left;Trunk flexed     General Gait Details: Verbal cueing for sequencing, walker proximity, decreased right step length.   Stairs              Wheelchair Mobility    Modified Rankin (Stroke Patients Only)       Balance Overall balance assessment: Needs assistance Sitting-balance support: Feet supported;No upper extremity supported Sitting balance-Leahy Scale: Good     Standing balance support: Bilateral upper extremity supported Standing balance-Leahy Scale: Poor                              Cognition Arousal/Alertness: Awake/alert Behavior During Therapy: WFL for tasks assessed/performed Overall Cognitive Status: Within Functional Limits for tasks assessed                                        Exercises General Exercises - Lower Extremity Long Arc Quad: Right;Seated;10 reps Hip ABduction/ADduction: 10 reps;Both;Seated Hip Flexion/Marching: 10 reps;Both;Seated Heel Raises: 10 reps;Both;Seated    General Comments        Pertinent Vitals/Pain Pain Assessment: Faces Faces Pain Scale: Hurts whole lot Pain Location: tender to palpation distal to medial malleolus Pain Descriptors / Indicators: Sore;Sharp Pain Intervention(s): Monitored during session    Home Living                      Prior Function            PT Goals (current goals can now be found in the care plan section) Acute Rehab PT Goals Patient Stated Goal: "get over this pain and go home" PT Goal Formulation: With patient/family Time For Goal Achievement: 04/08/18 Potential to Achieve Goals: Good Progress towards  PT goals: Progressing toward goals    Frequency    Min 3X/week      PT Plan Current plan remains appropriate    Co-evaluation              AM-PAC PT "6 Clicks" Mobility   Outcome Measure  Help needed turning from your back to your side while in a flat bed without using bedrails?: None Help needed moving from lying on your back to sitting on the side of a flat bed without using bedrails?: None Help needed moving to and from a bed to a chair (including a wheelchair)?: A  Little Help needed standing up from a chair using your arms (e.g., wheelchair or bedside chair)?: A Little Help needed to walk in hospital room?: A Little Help needed climbing 3-5 steps with a railing? : A Lot 6 Click Score: 19    End of Session Equipment Utilized During Treatment: Gait belt;Oxygen Activity Tolerance: Patient tolerated treatment well Patient left: in chair;with call bell/phone within reach;with family/visitor present Nurse Communication: Mobility status PT Visit Diagnosis: Other abnormalities of gait and mobility (R26.89);Difficulty in walking, not elsewhere classified (R26.2);Pain Pain - Right/Left: Right Pain - part of body: Leg     Time: 9735-3299 PT Time Calculation (min) (ACUTE ONLY): 25 min  Charges:  $Gait Training: 8-22 mins $Therapeutic Exercise: 8-22 mins                     Ellamae Sia, PT, DPT Acute Rehabilitation Services Pager [phone] Office [phone]    Julian Miranda 03/26/2018, 5:20 PM

## 2018-03-26 NOTE — Progress Notes (Signed)
5 cc of IV Morphine  from PCA  Pump wasted with Antonique RN as witness.

## 2018-03-27 ENCOUNTER — Telehealth: Payer: Self-pay | Admitting: Vascular Surgery

## 2018-03-27 MED ORDER — COLCHICINE 0.6 MG PO TABS: 0.6000 mg | ORAL_TABLET | Freq: Every day | ORAL | 0 refills | Status: DC

## 2018-03-27 NOTE — Progress Notes (Signed)
Patient in a stable condition, discharge education reviwed with patient and his spouse at bedside, they verbalis ed understanding, iv removed, tele dc ccmd notified, patient belongings at bedside, prescriptions given to patient , patient to be transported home by his spouse.

## 2018-03-27 NOTE — Progress Notes (Addendum)
[facility]  Subjective  - Ankle pain has dissipated.   Objective 107/73 89 98.4 F (36.9 C) (Oral) 14 95%  Intake/Output Summary (Last 24 hours) at 03/27/2018 0707 Last data filed at 03/27/2018 0045 Gross per 24 hour  Intake 360 ml  Output -  Net 360 ml    Palpable DP B LE Right leg incisions healing well Right ankle tender to palpation, bu improved. 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  Off PCA, pain controlled with morphine IVP Ambulated to the door in his room which is an improvement. Discharge pending pain control on PO only and mobility. HH PT, hospital bed, 3 in 1, rolling walker.  Roxy Horseman 03/27/2018 7:07 AM --  Laboratory Lab Results: No results for input(s): WBC, HGB, HCT, PLT in the last 72 hours. BMET No results for input(s): NA, K, CL, CO2, GLUCOSE, BUN, CREATININE, CALCIUM in the last 72 hours.  COAG Lab Results  Component Value Date   INR 1.03 03/23/2018   INR 1.18 03/17/2018   No results found for: PTT   I have seen and evaluated the patient. I agree with the PA note as documented above. Right ankle much better after treating for gout.  Palpable pedal pulse in right foot.  Walked yesterday.  May try and discharge this afternoon if does ok with PT.  Marty Heck, MD [facility] Office: [phone] Pager: [phone]

## 2018-03-27 NOTE — Progress Notes (Signed)
Occupational Therapy Treatment and Discharge Patient Details Name: Julian Miranda MRN: [id number] DOB: June 13, 1950 Today's Date: 03/27/2018    History of present illness Julian Miranda is a 67 y.o. male, with history of coronary artery disease status post PCI, hypertension. Pt s/p RLE bypass with popliteal aneurysm exclusion.   OT comments  This 67 yo male admitted and s/p above presents to acute OT with all basic ADL education completed with pt and wife and they are without further questions. We will sign off.  Follow Up Recommendations  No OT follow up;Supervision - Intermittent    Equipment Recommendations  3 in 1 bedside commode;Hospital bed       Precautions / Restrictions Precautions Precautions: Fall Precaution Comments: R LE incisions Restrictions Weight Bearing Restrictions: No Other Position/Activity Restrictions: No climbing stairs x 30 days per MD        Mobility Bed Mobility Overal bed mobility: Modified Independent             General bed mobility comments: HOB up  Transfers Overall transfer level: Needs assistance Equipment used: Rolling walker (2 wheeled) Transfers: Sit to/from Stand Sit to Stand: Supervision         General transfer comment: VCs for safe hand placement    Balance Overall balance assessment: Needs assistance Sitting-balance support: No upper extremity supported;Feet supported Sitting balance-Leahy Scale: Good     Standing balance support: No upper extremity supported;During functional activity Standing balance-Leahy Scale: Fair Standing balance comment: standing at sink to wash hands                           ADL either performed or assessed with clinical judgement   ADL Overall ADL's : Needs assistance/impaired Eating/Feeding: Independent;Sitting   Grooming: Supervision/safety;Set up;Wash/dry hands;Standing               Lower Body Dressing: Set up;Supervision/safety;Sit to/from stand;With adaptive  equipment Lower Body Dressing Details (indicate cue type and reason): Pt and wife report wife and or other family members can A Toilet Transfer: Min guard;RW;Regular Toilet;Grab bars;Ambulation   Toileting- Clothing Manipulation and Hygiene: Min guard;Sit to/from stand         General ADL Comments: Educated on which leg to put in first for LB dressing; educated on how to position 3n1 in walk in shower for ease of getting on and off of it in shower and recommended a HH shower      Vision Baseline Vision/History: Wears glasses Wears Glasses: Reading only Patient Visual Report: No change from baseline            Cognition Arousal/Alertness: Awake/alert Behavior During Therapy: WFL for tasks assessed/performed Overall Cognitive Status: Within Functional Limits for tasks assessed                                                     Pertinent Vitals/ Pain       Pain Assessment: (just some pressure with bending forward to work on doffing and donning socks)     Prior Functioning/Environment              Frequency  Min 2X/week        Progress Toward Goals  OT Goals(current goals can now be found in the care plan section)  Progress towards OT goals: Progressing toward goals(and all  needed education compeleted,)     Plan Discharge plan needs to be updated       AM-PAC OT "6 Clicks" Daily Activity     Outcome Measure   Help from another person eating meals?: None Help from another person taking care of personal grooming?: A Little Help from another person toileting, which includes using toliet, bedpan, or urinal?: A Little Help from another person bathing (including washing, rinsing, drying)?: A Little Help from another person to put on and taking off regular upper body clothing?: A Little Help from another person to put on and taking off regular lower body clothing?: A Little 6 Click Score: 19    End of Session Equipment Utilized During  Treatment: Rolling walker  OT Visit Diagnosis: Other abnormalities of gait and mobility (R26.89)   Activity Tolerance Patient tolerated treatment well   Patient Left (sitting EOB waiting on PT to ambulate)           Time: 3143-8887 OT Time Calculation (min): 35 min  Charges: OT General Charges $OT Visit: 1 Visit OT Treatments $Self Care/Home Management : 23-37 mins  [name], OTR/L Acute Rehab Services Pager [phone] Office [phone]      Almon Register 03/27/2018, 9:22 AM

## 2018-03-27 NOTE — Progress Notes (Signed)
Physical Therapy Treatment Patient Details Name: Julian Miranda MRN: [id number] DOB: [birth year] Today's Date: 03/27/2018    History of Present Illness Julian Miranda is a 67 y.o. male, with history of coronary artery disease status post PCI, hypertension. Pt s/p RLE bypass with popliteal aneurysm exclusion.    PT Comments    Continuing work on functional mobility and activity tolerance;  Noting very nice progress over the last 24 hours; Much improved pain with amb, and he was able to increase amb distance considerably; Stair training complete; Questions solicited and answered; Discussed car transfers; OK for dc home from PT standpoint.   Follow Up Recommendations  Home health PT;Supervision for mobility/OOB     Equipment Recommendations  3in1 (PT);Hospital bed;Rolling walker with 5" wheels    Recommendations for Other Services       Precautions / Restrictions Precautions Precautions: Fall Precaution Comments: R LE incisions Restrictions Weight Bearing Restrictions: No Other Position/Activity Restrictions: No climbing stairs x 30 days per MD     Mobility  Bed Mobility Overal bed mobility: Modified Independent             General bed mobility comments: HOB up  Transfers Overall transfer level: Needs assistance Equipment used: Rolling walker (2 wheeled) Transfers: Sit to/from Stand Sit to Stand: Supervision         General transfer comment: VCs for safe hand placement  Ambulation/Gait Ambulation/Gait assistance: Min guard;Supervision Gait Distance (Feet): 150 Feet Assistive device: Rolling walker (2 wheeled) Gait Pattern/deviations: Step-through pattern     General Gait Details: Minguard progressing to Supervision; Pain much better, and lending to more symmetrical gait pattern   Stairs Stairs: Yes   Stair Management: No rails;Step to pattern;Forwards Number of Stairs: 4 General stair comments: One hand on wall, and opposite UE with support from PT;  Cues for sequence; Overall managed stairs well, and will be able to tell his sons how to give assist   Wheelchair Mobility    Modified Rankin (Stroke Patients Only)       Balance Overall balance assessment: Needs assistance Sitting-balance support: No upper extremity supported;Feet supported Sitting balance-Leahy Scale: Good     Standing balance support: No upper extremity supported;During functional activity Standing balance-Leahy Scale: Fair Standing balance comment: standing at sink to wash hands                            Cognition Arousal/Alertness: Awake/alert Behavior During Therapy: WFL for tasks assessed/performed Overall Cognitive Status: Within Functional Limits for tasks assessed                                        Exercises General Exercises - Lower Extremity Long Arc Quad: Right;Seated;10 reps    General Comments        Pertinent Vitals/Pain Pain Assessment: 0-10 Pain Score: 4  Pain Location: RLE(also pressure with bending forward to work on doffing a) Pain Descriptors / Indicators: Sore Pain Intervention(s): Monitored during session    Home Living                      Prior Function            PT Goals (current goals can now be found in the care plan section) Acute Rehab PT Goals Patient Stated Goal: "get over this pain and go home" PT Goal Formulation: With  patient/family Time For Goal Achievement: 04/08/18 Potential to Achieve Goals: Good Progress towards PT goals: Progressing toward goals    Frequency    Min 3X/week      PT Plan Current plan remains appropriate    Co-evaluation              AM-PAC PT "6 Clicks" Mobility   Outcome Measure  Help needed turning from your back to your side while in a flat bed without using bedrails?: None Help needed moving from lying on your back to sitting on the side of a flat bed without using bedrails?: None Help needed moving to and from a bed to  a chair (including a wheelchair)?: None Help needed standing up from a chair using your arms (e.g., wheelchair or bedside chair)?: None Help needed to walk in hospital room?: A Little Help needed climbing 3-5 steps with a railing? : A Little 6 Click Score: 22    End of Session Equipment Utilized During Treatment: Gait belt Activity Tolerance: Patient tolerated treatment well Patient left: in bed;with call bell/phone within reach Nurse Communication: Mobility status;Other (comment)(Needs RW to be delivered to room) PT Visit Diagnosis: Other abnormalities of gait and mobility (R26.89);Difficulty in walking, not elsewhere classified (R26.2);Pain Pain - Right/Left: Right Pain - part of body: Leg     Time: 1572-6203 PT Time Calculation (min) (ACUTE ONLY): 21 min  Charges:  $Gait Training: 8-22 mins                     Roney Marion, PT  [place] Pager [phone] Office [place] 03/27/2018, 9:53 AM

## 2018-03-27 NOTE — Telephone Encounter (Signed)
-----   Message from Mena Goes, RN sent at 03/24/2018  9:50 AM EST ----- Regarding: FW: timeframe is 2-3 weeks on this message  ----- Message ----- From: Gabriel Earing, PA-C Sent: 03/23/2018   3:17 PM EST To: Mena Goes, RN Subject: RE: What timeframe?                            Sorry about that 2-3 weeks!  ----- Message ----- From: Mena Goes, RN Sent: 03/23/2018   1:23 PM EST To: Gabriel Earing, PA-C Subject: What timeframe?                                 ----- Message ----- From: Gabriel Earing, PA-C Sent: 03/23/2018   1:09 PM EST To: [facility] Admin Pool, [facility] Charge Pool  S/p right pop pop bypass graft with vein 03/23/18.  F/u with Dr. Carlis Abbott with ABI's and RLE arterial duplex.  Thanks

## 2018-03-27 NOTE — Telephone Encounter (Signed)
sch appt spk to pt son in law mld ltr 04/11/2018 3pm ABI 4pm LE Art 4pm p/o MD

## 2018-03-27 NOTE — Care Management Important Message (Signed)
Important Message  Patient Details  Name: Julian Miranda MRN: [id number] Date of Birth: 1950/04/19   Medicare Important Message Given:  Yes    Elyse Prevo P [place] 03/27/2018, 3:07 PM

## 2018-03-27 NOTE — Care Management Note (Signed)
Case Management Note Marvetta Gibbons RN, BSN Transitions of Care Unit 4E- RN Case Manager [phone]  Patient Details  Name: Julian Miranda MRN: [id number] Date of Birth: 01-25-1951  Subjective/Objective:   Pt admitted s/p popliteal aneurysm repair                 Action/Plan: PTA pt lived at home with spouse, orders placed for HHPT and DME- hospital bed, 3n1 and RW. CM spoke with pt and wife at bedside to discuss transition of care needs. Per wife- she has made arrangements and spoken with Juliann Pulse at [facility] about hospital bed arrangements- they already have order and plan to deliver bed today between 10-12 this am. Offered RW and 3n1 to be delivered per [facility] to the bedside wife and pt agreeable. List provided per CMS webside with star ratings for [facility] agency choice- per wife they want to use [facility] Call made to [facility] [phone])- spoke with Tammy for [facility] referral- [facility] will be tomorrow 12/24 for HHPT- orders and documents faxed to [place] via Epic to [phone]. Call made to [facility] with [facility] for DME needs- however they do not have needed DME in stock- spoke again with pt at bedside who was agreeable to have all DME come from [facility]. CM will call [facility] to see if they can have 3n1 and RW delivered along with bed to home. Call made to [facility] [phone]) and spoke with Juliann Pulse who states that they can have all DME delivered to home- orders for 3n1 and RW have been faxed to [facility]- at [phone].  Expected Discharge Date:  03/27/18               Expected Discharge Plan:  [place]  In-House Referral:  NA  Discharge planning Services  CM Consult  Post Acute Care Choice:  Durable Medical Equipment, Home Health Choice offered to:  Patient, Spouse  DME Arranged:  3-N-1, Walker rolling DME Agency:  [place]:  PT [facility] Agency:  [facility]  Status of Service:  Completed, signed off  If  discussed at [place] of Stay Meetings, dates discussed:    Discharge Disposition: home/home health   Additional Comments:  Dawayne Patricia, RN 03/27/2018, 10:58 AM

## 2018-03-30 ENCOUNTER — Other Ambulatory Visit: Payer: Self-pay

## 2018-03-30 DIAGNOSIS — I724 Aneurysm of artery of lower extremity: Secondary | ICD-10-CM

## 2018-04-11 ENCOUNTER — Ambulatory Visit (HOSPITAL_COMMUNITY)
Admission: RE | Admit: 2018-04-11 | Discharge: 2018-04-11 | Disposition: A | Discharge status: Home / Self Care | Payer: Medicare Other | Source: Ambulatory Visit | Attending: Internal Medicine | Admitting: Internal Medicine

## 2018-04-11 ENCOUNTER — Other Ambulatory Visit: Payer: Self-pay

## 2018-04-11 ENCOUNTER — Ambulatory Visit (INDEPENDENT_AMBULATORY_CARE_PROVIDER_SITE_OTHER)
Admit: 2018-04-11 | Discharge: 2018-04-11 | Disposition: A | Discharge status: Home / Self Care | Payer: Medicare Other | Attending: Internal Medicine | Admitting: Internal Medicine

## 2018-04-11 ENCOUNTER — Encounter: Payer: Self-pay | Admitting: Vascular Surgery

## 2018-04-11 ENCOUNTER — Ambulatory Visit (INDEPENDENT_AMBULATORY_CARE_PROVIDER_SITE_OTHER): Payer: Self-pay | Admitting: Vascular Surgery

## 2018-04-11 VITALS — BP 137/92 | HR 88 | Temp 97.5°F | Resp 16 | Ht 70.0 in | Wt 228.0 lb

## 2018-04-11 DIAGNOSIS — I724 Aneurysm of artery of lower extremity: Secondary | ICD-10-CM

## 2018-04-11 MED ORDER — HYDROCODONE-ACETAMINOPHEN 5-325 MG PO TABS: 1.0000 | ORAL_TABLET | Freq: Four times a day (QID) | ORAL | 0 refills | Status: DC | PRN

## 2018-04-11 NOTE — Progress Notes (Signed)
Patient name: Julian Miranda MRN: [id number] DOB: 01/07/1951 Sex: male  REASON FOR VISIT: 3-week postop check after right lower extremity bypass and exclusion of popliteal aneurysm  HPI: Julian Miranda is a 68 y.o. male that presents for 3-week follow-up after right distal SFA to below-knee pop bypass with reversed saphenous vein on 03/23/2018.  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  On follow-up today states overall he is doing well.  He is working with physical therapy and using his walker.  He has not started walking up stairs yet.  He states the pressure he was having behind his right knee is improving.  He has had no numbness tingling or other weakness in the right foot.  Past Medical History:  Diagnosis Date  . CAD (coronary artery disease)   . GERD (gastroesophageal reflux disease)   . Hypertension   . Myocardial infarction ([place]) 1994  . Peripheral vascular disease ([place])   . Stroke [facility])     Past Surgical History:  Procedure Laterality Date  . BYPASS GRAFT POPLITEAL TO POPLITEAL Right 03/23/2018   Procedure: BYPASS GRAFT RIGHT ABOVE KNEE POPLITEAL TO BELOW KNEE POPLITEAL ARTERY  USING RIGHT GREAT SAPHENOUS VEIN;  Surgeon: Marty Heck, MD;  Location: [facility];  Service: Vascular;  Laterality: Right;  . COLONOSCOPY WITH ESOPHAGOGASTRODUODENOSCOPY (EGD)    . EYE SURGERY Bilateral    cataracts  . FEMORAL BYPASS Right 03/23/2018  . HERNIA REPAIR    . MASTOIDECTOMY    . ROTATOR CUFF REPAIR Right   . VEIN HARVEST Right 03/23/2018   Procedure: VEIN HARVEST RIGHT GREAT SAPHENOUS;  Surgeon: Marty Heck, MD;  Location: [facility] OR;  Service: Vascular;  Laterality: Right;    Family History  Problem Relation Age of Onset  . Heart disease Mother     SOCIAL HISTORY: Social History   Tobacco Use  . Smoking status: Never Smoker  . Smokeless tobacco: Never Used  Substance Use Topics  . Alcohol use: Never    Frequency: Never    No Known  Allergies  Current Outpatient Medications  Medication Sig Dispense Refill  . ALPRAZolam (XANAX) 0.25 MG tablet Take 0.25 mg by mouth 4 (four) times daily.     Marland Kitchen amLODipine (NORVASC) 10 MG tablet Take 10 mg by mouth at bedtime.     Marland Kitchen aspirin 81 MG tablet Take 81 mg by mouth daily.    Marland Kitchen atorvastatin (LIPITOR) 10 MG tablet Take 10 mg by mouth at bedtime.     . cloNIDine (CATAPRES) 0.2 MG tablet Take 0.2 mg by mouth 2 (two) times daily.     . colchicine 0.6 MG tablet Take 1 tablet (0.6 mg total) by mouth daily. 30 tablet 0  . diazepam (VALIUM) 2 MG tablet Take 2 mg by mouth 2 (two) times daily as needed (dizziness).     . irbesartan-hydrochlorothiazide (AVALIDE) 300-12.5 MG tablet Take 1 tablet by mouth daily.     . meclizine (ANTIVERT) 25 MG tablet Take 25 mg by mouth 2 (two) times daily as needed for dizziness.     . metoprolol succinate (TOPROL-XL) 25 MG 24 hr tablet Take 25 mg by mouth 2 (two) times daily.    . Multiple Minerals (CALCIUM/MAGNESIUM/ZINC PO) Take 1 tablet by mouth at bedtime.    Marland Kitchen omeprazole (PRILOSEC) 20 MG capsule Take 20 mg by mouth 2 (two) times daily.     Marland Kitchen oxyCODONE-acetaminophen (PERCOCET/ROXICET) 5-325 MG tablet Take 1 tablet by mouth every 6 (  six) hours as needed for moderate pain. 20 tablet 0  . HYDROcodone-acetaminophen (NORCO/VICODIN) 5-325 MG tablet Take 1 tablet by mouth every 6 (six) hours as needed for moderate pain. 30 tablet 0   No current facility-administered medications for this visit.     REVIEW OF SYSTEMS:  [X]  denotes positive finding, [ ]  denotes negative finding Cardiac  Comments:  Chest pain or chest pressure:    Shortness of breath upon exertion:    Short of breath when lying flat:    Irregular heart rhythm:        Vascular    Pain in calf, thigh, or hip brought on by ambulation:    Pain in feet at night that wakes you up from your sleep:     Blood clot in your veins:    Leg swelling:         Pulmonary    Oxygen at home:    Productive  cough:     Wheezing:         Neurologic    Sudden weakness in arms or legs:     Sudden numbness in arms or legs:     Sudden onset of difficulty speaking or slurred speech:    Temporary loss of vision in one eye:     Problems with dizziness:         Gastrointestinal    Blood in stool:     Vomited blood:         Genitourinary    Burning when urinating:     Blood in urine:        Psychiatric    Major depression:         Hematologic    Bleeding problems:    Problems with blood clotting too easily:        Skin    Rashes or ulcers:        Constitutional    Fever or chills:      PHYSICAL EXAM: Vitals:   04/11/18 1601  BP: (!) 137/92  Pulse: 88  Resp: 16  Temp: (!) 97.5 F (36.4 C)  TempSrc: Oral  SpO2: 93%  Weight: 228 lb (103.4 kg)  Height: 5\' 10"  (1.778 m)    GENERAL: The patient is a well-nourished male, in no acute distress. The vital signs are documented above. CARDIAC: There is a regular rate and rhythm.  VASCULAR:  Right leg incisions c/d/i and healing Right Dp pulse 2+ palpable Left DP pulse 2+ palpable Minimal tenderness behind right knee PULMONARY: There is good air exchange bilaterally without wheezing or rales. ABDOMEN: Soft and non-tender with normal pitched bowel sounds.  MUSCULOSKELETAL: There are no major deformities or cyanosis. NEUROLOGIC: No focal weakness or paresthesias are detected.   DATA:   I independently reviewed his non-invasive imaging and ABIs are normal and he has a widely patent right lower extremity bypass on arterial duplex.  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  Patient overall is doing very well.  He has a palpable dorsalis pedis pulse in the right foot and his bypass is widely patent on duplex.  In addition the pressure behind his right knee is improving since exclusion of the aneurysm.   He does have an approximate 2.0 cm aneurysm on the left that will need to be addressed in the near future.  I will plan to see him back in 1  month just to monitor his overall progress and we discussed pending his progress with physical therapy and mobility with his right leg we can then decide when to address his left leg.   Marty Heck, MD [facility] [place] Office: [phone] Pager: [place]

## 2018-05-16 ENCOUNTER — Ambulatory Visit (INDEPENDENT_AMBULATORY_CARE_PROVIDER_SITE_OTHER): Payer: Medicare Other | Admitting: Vascular Surgery

## 2018-05-16 ENCOUNTER — Encounter: Payer: Self-pay | Admitting: Vascular Surgery

## 2018-05-16 ENCOUNTER — Other Ambulatory Visit: Payer: Self-pay

## 2018-05-16 VITALS — BP 145/85 | HR 88 | Resp 18 | Ht 70.0 in | Wt 228.0 lb

## 2018-05-16 DIAGNOSIS — I724 Aneurysm of artery of lower extremity: Secondary | ICD-10-CM

## 2018-05-16 NOTE — Progress Notes (Signed)
Patient name: Julian Miranda MRN: [id number] DOB: 09-Oct-1950 Sex: male  REASON FOR VISIT: one month follow-up after right lower extremity bypass and exclusion of popliteal aneurysm  HPI: Julian Miranda is a 68 y.o. male that presents for ongoing follow-up after right distal SFA to below-knee pop bypass with reversed saphenous vein on 03/23/2018.  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  His incisions have healed without issue.  Walking up stairs now.  Also has a left popliteal aneurysm.  No abdominal aneurysm.  Past Medical History:  Diagnosis Date  . CAD (coronary artery disease)   . GERD (gastroesophageal reflux disease)   . Hypertension   . Myocardial infarction ([place]) 1994  . Peripheral vascular disease ([place])   . Stroke [facility])     Past Surgical History:  Procedure Laterality Date  . BYPASS GRAFT POPLITEAL TO POPLITEAL Right 03/23/2018   Procedure: BYPASS GRAFT RIGHT ABOVE KNEE POPLITEAL TO BELOW KNEE POPLITEAL ARTERY  USING RIGHT GREAT SAPHENOUS VEIN;  Surgeon: Marty Heck, MD;  Location: [facility];  Service: Vascular;  Laterality: Right;  . COLONOSCOPY WITH ESOPHAGOGASTRODUODENOSCOPY (EGD)    . EYE SURGERY Bilateral    cataracts  . FEMORAL BYPASS Right 03/23/2018  . HERNIA REPAIR    . MASTOIDECTOMY    . ROTATOR CUFF REPAIR Right   . VEIN HARVEST Right 03/23/2018   Procedure: VEIN HARVEST RIGHT GREAT SAPHENOUS;  Surgeon: Marty Heck, MD;  Location: [facility] OR;  Service: Vascular;  Laterality: Right;    Family History  Problem Relation Age of Onset  . Heart disease Mother     SOCIAL HISTORY: Social History   Tobacco Use  . Smoking status: Never Smoker  . Smokeless tobacco: Never Used  Substance Use Topics  . Alcohol use: Never    Frequency: Never    No Known Allergies  Current Outpatient Medications  Medication Sig Dispense Refill  . ALPRAZolam (XANAX) 0.25 MG tablet Take 0.25 mg by mouth 4 (four) times daily.     Marland Kitchen  amLODipine (NORVASC) 10 MG tablet Take 10 mg by mouth at bedtime.     Marland Kitchen aspirin 81 MG tablet Take 81 mg by mouth daily.    Marland Kitchen atorvastatin (LIPITOR) 10 MG tablet Take 10 mg by mouth at bedtime.     . cloNIDine (CATAPRES) 0.2 MG tablet Take 0.2 mg by mouth 2 (two) times daily.     . colchicine 0.6 MG tablet Take 1 tablet (0.6 mg total) by mouth daily. 30 tablet 0  . diazepam (VALIUM) 2 MG tablet Take 2 mg by mouth 2 (two) times daily as needed (dizziness).     Marland Kitchen HYDROcodone-acetaminophen (NORCO/VICODIN) 5-325 MG tablet Take 1 tablet by mouth every 6 (six) hours as needed for moderate pain. 30 tablet 0  . irbesartan-hydrochlorothiazide (AVALIDE) 300-12.5 MG tablet Take 1 tablet by mouth daily.     . meclizine (ANTIVERT) 25 MG tablet Take 25 mg by mouth 2 (two) times daily as needed for dizziness.     . metoprolol succinate (TOPROL-XL) 25 MG 24 hr tablet Take 25 mg by mouth 2 (two) times daily.    . Multiple Minerals (CALCIUM/MAGNESIUM/ZINC PO) Take 1 tablet by mouth at bedtime.    Marland Kitchen omeprazole (PRILOSEC) 20 MG capsule Take 20 mg by mouth 2 (two) times daily.     Marland Kitchen oxyCODONE-acetaminophen (PERCOCET/ROXICET) 5-325 MG tablet Take 1 tablet by mouth every 6 (six) hours as needed for moderate pain. 20 tablet 0  No current facility-administered medications for this visit.     REVIEW OF SYSTEMS:  [X]  denotes positive finding, [ ]  denotes negative finding Cardiac  Comments:  Chest pain or chest pressure:    Shortness of breath upon exertion:    Short of breath when lying flat:    Irregular heart rhythm:        Vascular    Pain in calf, thigh, or hip brought on by ambulation:    Pain in feet at night that wakes you up from your sleep:     Blood clot in your veins:    Leg swelling:         Pulmonary    Oxygen at home:    Productive cough:     Wheezing:         Neurologic    Sudden weakness in arms or legs:     Sudden numbness in arms or legs:     Sudden onset of difficulty speaking or  slurred speech:    Temporary loss of vision in one eye:     Problems with dizziness:         Gastrointestinal    Blood in stool:     Vomited blood:         Genitourinary    Burning when urinating:     Blood in urine:        Psychiatric    Major depression:         Hematologic    Bleeding problems:    Problems with blood clotting too easily:        Skin    Rashes or ulcers:        Constitutional    Fever or chills:      PHYSICAL EXAM: Vitals:   05/16/18 1340  BP: (!) 145/85  Pulse: 88  Resp: 18  SpO2: 95%  Weight: 228 lb (103.4 kg)  Height: 5\' 10"  (1.778 m)    GENERAL: The patient is a well-nourished male, in no acute distress. The vital signs are documented above. CARDIAC: There is a regular rate and rhythm.  VASCULAR:  Right leg incisions healed Right Dp pulse 2+ palpable Left DP pulse 2+ palpable PULMONARY: There is good air exchange bilaterally without wheezing or rales. ABDOMEN: Soft and non-tender with normal pitched bowel sounds.  MUSCULOSKELETAL: There are no major deformities or cyanosis. NEUROLOGIC: No focal weakness or paresthesias are detected.   DATA:   None  Assessment/Plan:  68 year old male now status post right distal SFA to below-knee pop bypass with exclusion of right popliteal artery aneurysm greater than 4 cm on 03/23/2018.  He has recovered well from surgery and all of his incisions have healed and has a bounding dorsalis pedis pulse on exam.  All of the compressive symptoms behind his right knee have resolved.  Very pleased with his progress.  We talked about his left popliteal aneurysm that I measure at 1.7 cm on coronal and sagittal imaging from his CTA.  I discussed that I would be comfortable having him follow-up with me again in 3 months with another bilateral lower extremity arterial duplex to ensure that the right popliteal aneurysm is regressing and to get updated measurements on the left popliteal aneurysm.  Discussed typically  offer repair at >2 cm in setting of 3 vessel runoff.  Him and his wife are little bit concerned given plans the summer as to when he may require repair on the left side and discussed that if they were  to be overly anxious I am happy to proceed with repair on the left at this time.   Marty Heck, MD [facility] Office: [phone] Pager: [place]

## 2018-08-01 ENCOUNTER — Other Ambulatory Visit: Payer: Self-pay

## 2018-08-01 DIAGNOSIS — I724 Aneurysm of artery of lower extremity: Secondary | ICD-10-CM

## 2018-08-14 ENCOUNTER — Telehealth (HOSPITAL_COMMUNITY): Payer: Self-pay | Admitting: *Deleted

## 2018-08-14 NOTE — Telephone Encounter (Signed)
The above patient or their representative was contacted and gave the following answers to these questions:         Do you have any of the following symptoms?  no  Fever                    Cough                   Shortness of breath  Do  you have any of the following other symptoms? no   muscle pain         vomiting,        diarrhea        rash         weakness        red eye        abdominal pain         bruising          bruising or bleeding              joint pain           severe headache    Have you been in contact with someone who was or has been sick in the past 2 weeks?  no  Yes                 Unsure                         Unable to assess   Does the person that you were in contact with have any of the following symptoms?   Cough         shortness of breath           muscle pain         vomiting,            diarrhea            rash            weakness           fever            red eye           abdominal pain           bruising  or  bleeding                joint pain                severe headache               Have you  or someone you have been in contact with traveled internationally in th last month?   no      If yes, which countries?   Have you  or someone you have been in contact with traveled outside  in th last month?   no      If yes, which state and city?   COMMENTS OR ACTION PLAN FOR THIS PATIENT:          

## 2018-08-15 ENCOUNTER — Other Ambulatory Visit: Payer: Self-pay

## 2018-08-15 ENCOUNTER — Encounter (HOSPITAL_COMMUNITY): Payer: Medicare Other

## 2018-08-15 ENCOUNTER — Ambulatory Visit (HOSPITAL_COMMUNITY)
Admission: RE | Admit: 2018-08-15 | Discharge: 2018-08-15 | Disposition: A | Discharge status: Home / Self Care | Payer: Medicare Other | Source: Ambulatory Visit | Attending: Vascular Surgery | Admitting: Vascular Surgery

## 2018-08-15 ENCOUNTER — Ambulatory Visit: Payer: Medicare Other | Admitting: Vascular Surgery

## 2018-08-15 ENCOUNTER — Ambulatory Visit (INDEPENDENT_AMBULATORY_CARE_PROVIDER_SITE_OTHER): Payer: Medicare Other | Admitting: Vascular Surgery

## 2018-08-15 ENCOUNTER — Encounter: Payer: Self-pay | Admitting: Vascular Surgery

## 2018-08-15 VITALS — BP 135/87 | HR 81 | Temp 97.4°F | Resp 18 | Ht 70.0 in | Wt 229.0 lb

## 2018-08-15 DIAGNOSIS — I724 Aneurysm of artery of lower extremity: Secondary | ICD-10-CM | POA: Diagnosis present

## 2018-08-15 NOTE — Progress Notes (Signed)
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  Overally doing very well.  No complaints.  No compressive symptoms behind right knee anymore.  Does have fair amount of right leg swelling that has been ongoing since surgery.  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  Taking aspirin only now.  Past Medical History:  Diagnosis Date  . CAD (coronary artery disease)   . GERD (gastroesophageal reflux disease)   . Hypertension   . Myocardial infarction ([place]) 1994  . Peripheral vascular disease ([place])   . Stroke [facility])     Past Surgical History:  Procedure Laterality Date  . BYPASS GRAFT POPLITEAL TO POPLITEAL Right 03/23/2018   Procedure: BYPASS GRAFT RIGHT ABOVE KNEE POPLITEAL TO BELOW KNEE POPLITEAL ARTERY  USING RIGHT GREAT SAPHENOUS VEIN;  Surgeon: Marty Heck, MD;  Location: [facility];  Service: Vascular;  Laterality: Right;  . COLONOSCOPY WITH ESOPHAGOGASTRODUODENOSCOPY (EGD)    . EYE SURGERY Bilateral    cataracts  . FEMORAL BYPASS Right 03/23/2018  . HERNIA REPAIR    . MASTOIDECTOMY    . ROTATOR CUFF REPAIR Right   . VEIN HARVEST Right 03/23/2018   Procedure: VEIN HARVEST RIGHT GREAT SAPHENOUS;  Surgeon: Marty Heck, MD;  [facility] OR;  Service: Vascular;  Laterality: Right;    Family History  Problem Relation Age of Onset  . Heart disease Mother     SOCIAL HISTORY: Social History   Tobacco Use  . Smoking status: Never Smoker  . Smokeless tobacco: Never Used  Substance Use Topics  . Alcohol use:  Never    Frequency: Never    Allergies  Allergen Reactions  . Other     Current Outpatient Medications  Medication Sig Dispense Refill  . ALPRAZolam (XANAX) 0.25 MG tablet Take 0.25 mg by mouth 4 (four) times daily.     Marland Kitchen amLODipine (NORVASC) 10 MG tablet Take 10 mg by mouth at bedtime.     Marland Kitchen aspirin 81 MG tablet Take 81 mg by mouth daily.    Marland Kitchen atorvastatin (LIPITOR) 10 MG tablet Take 10 mg by mouth at bedtime.     . cloNIDine (CATAPRES) 0.2 MG tablet Take 0.2 mg by mouth 2 (two) times daily.     . diazepam (VALIUM) 2 MG tablet Take 2 mg by mouth 2 (two) times daily as needed (dizziness).     Marland Kitchen HYDROcodone-acetaminophen (NORCO/VICODIN) 5-325 MG tablet Take 1 tablet by mouth every 6 (six) hours as needed for moderate pain. 30 tablet 0  . irbesartan-hydrochlorothiazide (AVALIDE) 300-12.5 MG tablet Take 1 tablet by mouth daily.     . meclizine (ANTIVERT) 25 MG tablet Take 25 mg by mouth 2 (two) times daily as needed for dizziness.     . metoprolol succinate (TOPROL-XL) 25 MG 24 hr tablet Take 25 mg by mouth 2 (two) times daily.    . Multiple Minerals (CALCIUM/MAGNESIUM/ZINC PO) Take 1 tablet by mouth at bedtime.    Marland Kitchen omeprazole (PRILOSEC) 20 MG capsule Take 20 mg by mouth 2 (two) times daily.     Marland Kitchen  colchicine 0.6 MG tablet Take 1 tablet (0.6 mg total) by mouth daily. (Patient not taking: Reported on 08/15/2018) 30 tablet 0  . oxyCODONE-acetaminophen (PERCOCET/ROXICET) 5-325 MG tablet Take 1 tablet by mouth every 6 (six) hours as needed for moderate pain. (Patient not taking: Reported on 08/15/2018) 20 tablet 0   No current facility-administered medications for this visit.     REVIEW OF SYSTEMS:  [X]  denotes positive finding, [ ]  denotes negative finding Cardiac  Comments:  Chest pain or chest pressure:    Shortness of breath upon exertion:    Short of breath when lying flat:    Irregular heart rhythm:        Vascular    Pain in calf, thigh, or hip brought on by ambulation:    Pain  in feet at night that wakes you up from your sleep:     Blood clot in your veins:    Leg swelling:         Pulmonary    Oxygen at home:    Productive cough:     Wheezing:         Neurologic    Sudden weakness in arms or legs:     Sudden numbness in arms or legs:     Sudden onset of difficulty speaking or slurred speech:    Temporary loss of vision in one eye:     Problems with dizziness:         Gastrointestinal    Blood in stool:     Vomited blood:         Genitourinary    Burning when urinating:     Blood in urine:        Psychiatric    Major depression:         Hematologic    Bleeding problems:    Problems with blood clotting too easily:        Skin    Rashes or ulcers:        Constitutional    Fever or chills:      PHYSICAL EXAM: Vitals:   08/15/18 1124  BP: 135/87  Pulse: 81  Resp: 18  Temp: (!) 97.4 F (36.3 C)  TempSrc: Oral  SpO2: 99%  Weight: 229 lb (103.9 kg)  Height: 5\' 10"  (1.778 m)    GENERAL: The patient is a well-nourished male, in no acute distress. The vital signs are documented above. CARDIAC: There is a regular rate and rhythm.  VASCULAR:  Right leg incisions healed Right Dp pulse 2+ palpable Left DP pulse 2+ palpable PULMONARY: There is good air exchange bilaterally without wheezing or rales. ABDOMEN: Soft and non-tender with normal pitched bowel sounds.  MUSCULOSKELETAL: There are no major deformities or cyanosis. NEUROLOGIC: No focal weakness or paresthesias are detected.   DATA:   Independently reviewed his noninvasive imaging that shows a widely patent right above the knee to below-knee popliteal bypass with triphasic waveforms and excellent velocities.  There is no flow in the right popliteal aneurysm after exclusion.  The measurement is now 3.39 cm from greater than 4 cm prior to surgery. Left popliteal aneurysm now measures 1.88 cm in maximal diameter from 1.7 cm back in December on CT scan.  Assessment/Plan:   68 year old male now status post right distal SFA to below-knee pop bypass with exclusion of right popliteal artery aneurysm greater than 4 cm on 03/23/2018.  He has recovered well from surgery and all of his incisions have healed and has a bounding  dorsalis pedis pulse on exam.  All of the compressive symptoms behind his right knee have resolved.  Very pleased with his progress.  Right leg bypass widely patent on duplex today.  He is having some ongoing swelling in the right leg but I was hopeful this would improve with time.  We discussed conservative measures today like leg elevation as well as starting to put him in some light compression knee-high. Regarding his left popliteal aneurysm he is having no compressive symptoms and maximal diameter is now 1.88 cm.  He would like to get through this COVID crisis and discussed we typically offer intervention for greater than 2 cm aneurysm in the popliteal artery.  Open to see him again in 3 months for close surveillance given that he is very anxious about this and will repeat duplex at that time to measure size.   Marty Heck, MD [facility] Office: [phone] Pager: [place]

## 2018-10-02 DIAGNOSIS — I480 Paroxysmal atrial fibrillation: Secondary | ICD-10-CM | POA: Insufficient documentation

## 2018-10-05 DIAGNOSIS — G8918 Other acute postprocedural pain: Secondary | ICD-10-CM | POA: Insufficient documentation

## 2018-10-05 DIAGNOSIS — I2510 Atherosclerotic heart disease of native coronary artery without angina pectoris: Secondary | ICD-10-CM | POA: Insufficient documentation

## 2018-10-05 DIAGNOSIS — I214 Non-ST elevation (NSTEMI) myocardial infarction: Secondary | ICD-10-CM | POA: Insufficient documentation

## 2018-10-05 DIAGNOSIS — E785 Hyperlipidemia, unspecified: Secondary | ICD-10-CM | POA: Insufficient documentation

## 2018-10-05 DIAGNOSIS — Z951 Presence of aortocoronary bypass graft: Secondary | ICD-10-CM | POA: Insufficient documentation

## 2018-10-05 DIAGNOSIS — I10 Essential (primary) hypertension: Secondary | ICD-10-CM | POA: Insufficient documentation

## 2019-02-14 ENCOUNTER — Other Ambulatory Visit: Payer: Self-pay

## 2019-02-14 DIAGNOSIS — I724 Aneurysm of artery of lower extremity: Secondary | ICD-10-CM

## 2019-02-20 ENCOUNTER — Ambulatory Visit (INDEPENDENT_AMBULATORY_CARE_PROVIDER_SITE_OTHER): Payer: Medicare Other | Admitting: Vascular Surgery

## 2019-02-20 ENCOUNTER — Other Ambulatory Visit: Payer: Self-pay

## 2019-02-20 ENCOUNTER — Encounter: Payer: Self-pay | Admitting: Vascular Surgery

## 2019-02-20 ENCOUNTER — Ambulatory Visit (HOSPITAL_COMMUNITY)
Admission: RE | Admit: 2019-02-20 | Discharge: 2019-02-20 | Disposition: A | Discharge status: Home / Self Care | Payer: Medicare Other | Source: Ambulatory Visit | Attending: Family | Admitting: Family

## 2019-02-20 ENCOUNTER — Encounter: Payer: Self-pay | Admitting: *Deleted

## 2019-02-20 VITALS — BP 142/87 | HR 65 | Temp 98.2°F | Resp 20 | Ht 70.0 in | Wt 233.0 lb

## 2019-02-20 DIAGNOSIS — I724 Aneurysm of artery of lower extremity: Secondary | ICD-10-CM

## 2019-02-20 NOTE — Progress Notes (Signed)
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  Also been following a left popliteal aneurysm.  He states since his last follow-up he had a MI in June.  Ultimately had to be emergently transferred to [facility] and had four-vessel CABG.  He is currently finishing cardiac rehab at this time.  No issues with the right leg.  States he feels good whenever he walks.  No pain behind the right knee and that has all resolved.  No issues with the left leg either.  His CABG they used internal mammary according to the patient and did not take vein from his left leg.   Past Medical History:  Diagnosis Date  . CAD (coronary artery disease)   . GERD (gastroesophageal reflux disease)   . Hypertension   . Myocardial infarction ([place]) 1994  . Peripheral vascular disease ([place])   . Stroke [facility])     Past Surgical History:  Procedure Laterality Date  . BYPASS GRAFT POPLITEAL TO POPLITEAL Right 03/23/2018   Procedure: BYPASS GRAFT RIGHT ABOVE KNEE POPLITEAL TO BELOW KNEE POPLITEAL ARTERY  USING RIGHT GREAT SAPHENOUS VEIN;  Surgeon: Marty Heck, MD;  Location: [facility];  Service: Vascular;  Laterality: Right;  . COLONOSCOPY WITH ESOPHAGOGASTRODUODENOSCOPY (EGD)    . CORONARY ARTERY BYPASS GRAFT    . EYE SURGERY Bilateral    cataracts  . FEMORAL BYPASS Right 03/23/2018  . HERNIA REPAIR    . MASTOIDECTOMY    . ROTATOR CUFF REPAIR Right   . VEIN HARVEST Right 03/23/2018   Procedure: VEIN HARVEST RIGHT GREAT SAPHENOUS;  Surgeon: Marty Heck, MD;  Location: [facility] OR;  Service:  Vascular;  Laterality: Right;    Family History  Problem Relation Age of Onset  . Heart disease Mother     SOCIAL HISTORY: Social History   Tobacco Use  . Smoking status: Never Smoker  . Smokeless tobacco: Never Used  Substance Use Topics  . Alcohol use: Never    Frequency: Never    Allergies  Allergen Reactions  . Other   . Vancomycin     Other reaction(s): Red Man Syndrome Noted intraoperatively on 10/04/2018. No associated hemodynamic or respiratory changes. Please give slowly.    Current Outpatient Medications  Medication Sig Dispense Refill  . acetaminophen (TYLENOL) 325 MG tablet Take by mouth.    Marland Kitchen amiodarone (PACERONE) 200 MG tablet Take by mouth.    Marland Kitchen aspirin 81 MG tablet Take 81 mg by mouth daily.    Marland Kitchen atorvastatin (LIPITOR) 10 MG tablet Take 10 mg by mouth at bedtime.     . carvedilol (COREG) 12.5 MG tablet Take 12.5 mg by mouth 2 (two) times daily with a meal.    . furosemide (LASIX) 20 MG tablet Take by mouth.    . meclizine (ANTIVERT) 25 MG tablet Take 25 mg by mouth 2 (two) times daily as needed for dizziness.     . Multiple Minerals (CALCIUM/MAGNESIUM/ZINC PO) Take 1 tablet by mouth at bedtime.    Marland Kitchen omeprazole (PRILOSEC) 20 MG capsule Take 20 mg by mouth 2 (two) times daily.     Marland Kitchen  potassium chloride (KLOR-CON) 10 MEQ tablet Take by mouth.     No current facility-administered medications for this visit.     REVIEW OF SYSTEMS:  [X]  denotes positive finding, [ ]  denotes negative finding Cardiac  Comments:  Chest pain or chest pressure:    Shortness of breath upon exertion:    Short of breath when lying flat:    Irregular heart rhythm:        Vascular    Pain in calf, thigh, or hip brought on by ambulation:    Pain in feet at night that wakes you up from your sleep:     Blood clot in your veins:    Leg swelling:         Pulmonary    Oxygen at home:    Productive cough:     Wheezing:         Neurologic    Sudden weakness in arms or legs:      Sudden numbness in arms or legs:     Sudden onset of difficulty speaking or slurred speech:    Temporary loss of vision in one eye:     Problems with dizziness:         Gastrointestinal    Blood in stool:     Vomited blood:         Genitourinary    Burning when urinating:     Blood in urine:        Psychiatric    Major depression:         Hematologic    Bleeding problems:    Problems with blood clotting too easily:        Skin    Rashes or ulcers:        Constitutional    Fever or chills:      PHYSICAL EXAM: Vitals:   02/20/19 1046  BP: (!) 142/87  Pulse: 65  Resp: 20  Temp: 98.2 F (36.8 C)  SpO2: 96%  Weight: 233 lb (105.7 kg)  Height: 5\' 10"  (1.778 m)    GENERAL: The patient is a well-nourished male, in no acute distress. The vital signs are documented above. CARDIAC: There is a regular rate and rhythm.  VASCULAR:  Right leg incisions healed Right DP pulse 2+ palpable Left DP pulse 2+ palpable PULMONARY: There is good air exchange bilaterally without wheezing or rales. ABDOMEN: Soft and non-tender with normal pitched bowel sounds.  MUSCULOSKELETAL: There are no major deformities or cyanosis. NEUROLOGIC: No focal weakness or paresthesias are detected.  DATA:  Arterial duplex shows a widely patent right above the knee to below-knee popliteal bypass with triphasic waveforms and excellent velocities.  Aneurysm continues to decrease in size and now 3.02 cm from 3.39 cm in 08/2018.  Left popliteal aneurysm is increased from 1.88 to 2.12 cm.  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  Right lower extremity bypass appears widely patent.  Aneurysm continues to decrease in size and now measures about 3 cm .  Compressive symptoms resolved.  Left popliteal aneurysm has now increased from 1.88 to 2.12 cm.  Discussed plan for left  popliteal aneurysm repair in the near future.  Certainly does not need to be done emergently given slow growth.  I will let him complete his cardiac rehab and fully recover from his CABG.  He wants to schedule after the holidays the first  of the year which I think is fine.  He has already had vein mapping complete.  Risk and benefits discussed with patient.  He will call back to schedule a date early next year.  Marty Heck, MD [facility] [place] Office: [phone] Pager: [place]

## 2019-03-28 NOTE — Progress Notes (Signed)
Attempted to contact patient to schedule pre-procedure appointment, no answer, left message for patient to call back

## 2019-04-02 NOTE — Progress Notes (Signed)
[facility] [place], [place] - [place] [address] Phone: [phone] Fax: [phone]      Your procedure is scheduled on Monday, April 09, 2019 at 7:30 AM.  Report to [facility] Main Entrance "A" at 5:30 A.M., and check in at the Admitting office.  Call this number if you have problems the morning of surgery:  [phone]    Remember:  Do not eat or drink after midnight the night before your surgery    Take these medicines the morning of surgery with A SIP OF WATER: Acetaminophen (Tylenol) - if needed Alprazolam (Xanax) Amiodarone (Pacerone) Carvedilol (Coreg) Meclizine (Antivert) - if needed Omeprazole (Prilosec) - if needed  7 days prior to surgery STOP taking any Aspirin (unless otherwise instructed by your surgeon), Aleve, Naproxen, Ibuprofen, Motrin, Advil, Goody's, BC's, all herbal medications, fish oil, and all vitamins.    The Morning of Surgery  Do not wear jewelry.  Do not wear lotions, powders, colognes, or deodorant  Do not shave 48 hours prior to surgery.  Men may shave face and neck.  Do not bring valuables to the hospital.  [facility] is not responsible for any belongings or valuables.  If you are a smoker, DO NOT Smoke 24 hours prior to surgery  If you wear a CPAP at night please bring your mask, tubing, and machine the morning of surgery   Remember that you must have someone to transport you home after your surgery, and remain with you for 24 hours if you are discharged the same day.   Please bring cases for contacts, glasses, hearing aids, dentures or bridgework because it cannot be worn into surgery.    Leave your suitcase in the car.  After surgery it may be brought to your room.  For patients admitted to the hospital, discharge time will be determined by your treatment team.  Patients discharged the day of surgery will not be allowed to drive home.    Special instructions:    [place]- Preparing For Surgery  Before surgery, you can play an important role. Because skin is not sterile, your skin needs to be as free of germs as possible. You can reduce the number of germs on your skin by washing with CHG (chlorahexidine gluconate) Soap before surgery.  CHG is an antiseptic cleaner which kills germs and bonds with the skin to continue killing germs even after washing.    Oral Hygiene is also important to reduce your risk of infection.  Remember - BRUSH YOUR TEETH THE MORNING OF SURGERY WITH YOUR REGULAR TOOTHPASTE  Please do not use if you have an allergy to CHG or antibacterial soaps. If your skin becomes reddened/irritated stop using the CHG.  Do not shave (including legs and underarms) for at least 48 hours prior to first CHG shower. It is OK to shave your face.  Please follow these instructions carefully.   1. Shower the NIGHT BEFORE SURGERY and the MORNING OF SURGERY with CHG Soap.   2. If you chose to wash your hair, wash your hair first as usual with your normal shampoo.  3. After you shampoo, rinse your hair and body thoroughly to remove the shampoo.  4. Use CHG as you would any other liquid soap. You can apply CHG directly to the skin and wash gently with a scrungie or a clean washcloth.   5. Apply the CHG Soap to your body ONLY FROM THE NECK DOWN.  Do not use on open wounds or open sores. Avoid contact with your eyes, ears, mouth and genitals (private parts). Wash Face and genitals (private parts)  with your normal soap.   6. Wash thoroughly, paying special attention to the area where your surgery will be performed.  7. Thoroughly rinse your body with warm water from the neck down.  8. DO NOT shower/wash with your normal soap after using and rinsing off the CHG Soap.  9. Pat yourself dry with a CLEAN TOWEL.  10. Wear CLEAN PAJAMAS to bed the night before surgery, wear comfortable clothes the morning of surgery  11. Place CLEAN SHEETS on your bed  the night of your first shower and DO NOT SLEEP WITH PETS.    Day of Surgery:  Please shower the morning of surgery with the CHG soap Do not apply any deodorants/lotions. Please wear clean clothes to the hospital/surgery center.   Remember to brush your teeth WITH YOUR REGULAR TOOTHPASTE.   Please read over the following fact sheets that you were given.

## 2019-04-03 ENCOUNTER — Encounter (HOSPITAL_COMMUNITY)
Admission: RE | Admit: 2019-04-03 | Discharge: 2019-04-03 | Disposition: A | Discharge status: Home / Self Care | Payer: Medicare Other | Source: Ambulatory Visit | Attending: Vascular Surgery | Admitting: Vascular Surgery

## 2019-04-03 ENCOUNTER — Encounter (HOSPITAL_COMMUNITY): Payer: Self-pay

## 2019-04-03 ENCOUNTER — Other Ambulatory Visit: Payer: Self-pay

## 2019-04-03 ENCOUNTER — Other Ambulatory Visit: Payer: Self-pay | Admitting: *Deleted

## 2019-04-03 DIAGNOSIS — I252 Old myocardial infarction: Secondary | ICD-10-CM | POA: Diagnosis not present

## 2019-04-03 DIAGNOSIS — I2510 Atherosclerotic heart disease of native coronary artery without angina pectoris: Secondary | ICD-10-CM | POA: Insufficient documentation

## 2019-04-03 DIAGNOSIS — Z79899 Other long term (current) drug therapy: Secondary | ICD-10-CM | POA: Insufficient documentation

## 2019-04-03 DIAGNOSIS — Z01812 Encounter for preprocedural laboratory examination: Secondary | ICD-10-CM | POA: Insufficient documentation

## 2019-04-03 DIAGNOSIS — I10 Essential (primary) hypertension: Secondary | ICD-10-CM | POA: Diagnosis not present

## 2019-04-03 DIAGNOSIS — Z7982 Long term (current) use of aspirin: Secondary | ICD-10-CM | POA: Insufficient documentation

## 2019-04-03 LAB — BASIC METABOLIC PANEL
Anion gap: 10 (ref 5–15)
BUN: 15 mg/dL (ref 8–23)
CO2: 24 mmol/L (ref 22–32)
Calcium: 9.3 mg/dL (ref 8.9–10.3)
Chloride: 104 mmol/L (ref 98–111)
Creatinine, Ser: 1.15 mg/dL (ref 0.61–1.24)
GFR calc Af Amer: >60 mL/min (ref >60)
GFR calc non Af Amer: >60 mL/min (ref >60)
Glucose, Bld: 96 mg/dL (ref 70–99)
Potassium: 4.3 mmol/L (ref 3.5–5.1)
Sodium: 138 mmol/L (ref 135–145)

## 2019-04-03 LAB — CBC
HCT: 40 % (ref 39.0–52.0)
Hemoglobin: 12.7 g/dL — ABNORMAL LOW (ref 13.0–17.0)
MCH: 29.8 pg (ref 26.0–34.0)
MCHC: 31.8 g/dL (ref 30.0–36.0)
MCV: 93.9 fL (ref 80.0–100.0)
Platelets: 263 10*3/uL (ref 150–400)
RBC: 4.26 MIL/uL (ref 4.22–5.81)
RDW: 16 % — ABNORMAL HIGH (ref 11.5–15.5)
WBC: 8.4 10*3/uL (ref 4.0–10.5)
nRBC: 0 % (ref 0.0–0.2)

## 2019-04-03 LAB — SURGICAL PCR SCREEN
MRSA, PCR: NEGATIVE
Staphylococcus aureus: NEGATIVE

## 2019-04-03 NOTE — Progress Notes (Signed)
PCP - Dr. Lonia Mad Cardiologist - Dr. Cleora Fleet  PPM/ICD - Denies  Chest x-ray - 10/24/2018 - CE EKG - 10/24/2018 Stress Test - Requested ECHO - 10/01/2018 Cardiac Cath - Requested  Sleep Study - Denies  Patient denies being diabetic.   Coronavirus Screening  Have you experienced the following symptoms:  Cough yes/no: No Fever (>100.66F)  yes/no: No Runny nose yes/no: No Sore throat yes/no: No Difficulty breathing/shortness of breath  yes/no: No  Have you or a family member traveled in the last 14 days and where? yes/no: No   If the patient indicates "YES" to the above questions, their PAT will be rescheduled to limit the exposure to others and, the surgeon will be notified. THE PATIENT WILL NEED TO BE ASYMPTOMATIC FOR 14 DAYS.   If the patient is not experiencing any of these symptoms, the PAT nurse will instruct them to NOT bring anyone with them to their appointment since they may have these symptoms or traveled as well.   Please remind your patients and families that hospital visitation restrictions are in effect and the importance of the restrictions.    Aspirin Instructions: Instructed to stop today 04/03/2019   COVID TEST- Scheduled 04/05/2019   Anesthesia review: Yes, cardiac hx  Patient denies shortness of breath, fever, cough and chest pain at PAT appointment   All instructions explained to the patient, with a verbal understanding of the material. Patient agrees to go over the instructions while at home for a better understanding. Patient also instructed to self quarantine after being tested for COVID-19. The opportunity to ask questions was provided.

## 2019-04-04 NOTE — Progress Notes (Signed)
Anesthesia Chart Review:  Hx of CAD s/p remote NSTEMI requiring multivessel angioplasty in 1993. In June of 2020 he suffered NSTEMI requiring IABP support. Admitted to [facility] and eventually underwent CABG x 4 and LAAL with #40 atriclip on 10/04/2018. Had pre-op atrial fibrillation for which he underwent DCCV intra-op and LAAL with #40 atriclip was performed. He was loaded with 5 gram amiodarone load and started on metoprolol. No systemic anticoagulation was started post operatively as he remained in NSR until discharge. He did well postop and was discharged 10/08/18.  Outpatient cardiologist is Dr. Rosalita Chessman at [facility]. Last seen 01/24/19, only change at that visit was increase lisinopril from 10mg  to 20mg  daily   Per Dr. Ainsley Spinner last note 02/20/19 the patient was participating in cardiac rehab and feeling well from CV standpoint.   Dr. Ainsley Spinner office reached out to Dr. Rosalita Chessman for clearance but has not received response as of 12/31. Discussed case with Dr. Kalman Shan. He advised that okay to proceed as planned barring any acute status change.   Proep labs reviewed, unremarkable.   TTE 02/08/19 (copy on chart): Impression: Poorly seen LV with borderline normal systolic function.  The ejection fraction is 50%.  Inferior septal as well as inferior hypo to akinesis.   Grade 2 diastolic dysfunction.   There is mildly increased left ventricular wall thickness. Normal right ventricular size. Moderately dilated left atrium. Abnormal, trileaflet aortic valve.  There is mild aortic regurgitation and aortic valve sclerosis. There is mild mitral regurgitation. There is mild tricuspid regurgitation. Normal pulmonary artery systolic pressure.  Bedside echo 10/01/18 (care everywhere): MILD LV DYSFUNCTION (EF 45%) WITH MILD LVH  MILD RV SYSTOLIC DYSFUNCTION  VALVULAR REGURGITATION: TRIVIAL MR, TRIVIAL TR  NO VALVULAR STENOSIS  IABP NOTED IN DESCENDING AORTA   Wynonia Musty [facility] Short Stay  Center/Anesthesiology Phone [phone] 04/05/2019 3:38 PM

## 2019-04-05 ENCOUNTER — Other Ambulatory Visit (HOSPITAL_COMMUNITY)
Admission: RE | Admit: 2019-04-05 | Discharge: 2019-04-05 | Disposition: A | Discharge status: Home / Self Care | Payer: Medicare Other | Source: Ambulatory Visit | Attending: Vascular Surgery | Admitting: Vascular Surgery

## 2019-04-05 ENCOUNTER — Other Ambulatory Visit: Payer: Self-pay

## 2019-04-05 DIAGNOSIS — Z20828 Contact with and (suspected) exposure to other viral communicable diseases: Secondary | ICD-10-CM | POA: Diagnosis not present

## 2019-04-05 DIAGNOSIS — Z01812 Encounter for preprocedural laboratory examination: Secondary | ICD-10-CM | POA: Insufficient documentation

## 2019-04-05 LAB — SARS CORONAVIRUS 2 (TAT 6-24 HRS): SARS Coronavirus 2: NEGATIVE

## 2019-04-05 NOTE — Anesthesia Preprocedure Evaluation (Addendum)
Anesthesia Evaluation  Patient identified by MRN, date of birth, ID band Patient awake    Reviewed: Allergy & Precautions, NPO status , Patient's Chart, lab work & pertinent test results  Airway Mallampati: II  TM Distance: >3 FB Neck ROM: Full    Dental  (+) Teeth Intact   Pulmonary neg pulmonary ROS,    Pulmonary exam normal breath sounds clear to auscultation       Cardiovascular hypertension, Pt. on medications and Pt. 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 Fib prior to CABG DCCV ,on pacerone, currently in Sinus rhythm  MI x2 , 1993, 10/2018   Neuro/Psych Anxiety PTSDCVA    GI/Hepatic Neg liver ROS, GERD  Medicated,  Endo/Other  Hyperlipidemia Obesity  Renal/GU negative Renal ROS  negative genitourinary   Musculoskeletal negative musculoskeletal ROS (+)   Abdominal   Peds  Hematology negative hematology ROS (+)   Anesthesia Other Findings   Reproductive/Obstetrics                          Anesthesia Physical Anesthesia Plan  ASA: III  Anesthesia Plan: General   Post-op Pain Management:    Induction: Intravenous  PONV Risk Score and Plan: 3 and Ondansetron, Treatment may vary due to age or medical condition and Dexamethasone  Airway Management Planned: LMA and Oral ETT  Additional Equipment:   Intra-op Plan:   Post-operative Plan: Extubation in OR  Informed Consent: I have reviewed the patients History and Physical, chart, labs and discussed the procedure including the risks, benefits and alternatives for the proposed anesthesia with the patient or authorized representative who has indicated his/her understanding and acceptance.     Dental advisory  given  Plan Discussed with: CRNA and Surgeon  Anesthesia Plan Comments: (Hx of CAD s/p remote NSTEMI requiring multivessel angioplasty in 1993. In June of 2020 he suffered NSTEMI requiring IABP support. Admitted to [facility] and eventually underwent CABG x 4 and LAAL with #40 atriclip on 10/04/2018. Had pre-op atrial fibrillation for which he underwent DCCV intra-op and LAAL with #40 atriclip was performed. He was loaded with 5 gram amiodarone load and started on metoprolol. No systemic anticoagulation was started post operatively as he remained in NSR until discharge. He did well postop and was discharged 10/08/18.  Outpatient cardiologist is Dr. Rosalita Chessman at [facility]. Last seen 01/24/19, only change at that visit was increase lisinopril from 10mg  to 20mg  daily   Per Dr. Ainsley Spinner last note 02/20/19 the patient was participating in cardiac rehab and feeling well from CV standpoint.   Dr. Ainsley Spinner office reached out to Dr. Rosalita Chessman for clearance but has not received response as of 12/31. Discussed case with Dr. Kalman Shan. He advised that okay to proceed as planned barring any acute status change.   Proep labs reviewed, unremarkable.   TTE 02/08/19 (copy on chart): Impression: Poorly seen LV with borderline normal systolic function.  The ejection fraction is 50%.  Inferior septal as well as inferior hypo to akinesis.   Grade 2 diastolic dysfunction.   There is mildly increased left ventricular wall thickness. Normal right ventricular size. Moderately dilated left atrium. Abnormal, trileaflet aortic valve.  There is mild aortic regurgitation and aortic valve sclerosis. There is mild mitral regurgitation. There is mild tricuspid  regurgitation. Normal pulmonary artery systolic pressure.  Bedside echo 10/01/18 (care everywhere): MILD LV DYSFUNCTION (EF 45%) WITH MILD LVH  MILD RV SYSTOLIC DYSFUNCTION  VALVULAR REGURGITATION: TRIVIAL MR, TRIVIAL TR  NO VALVULAR STENOSIS  IABP NOTED IN DESCENDING  AORTA )      Anesthesia Quick Evaluation

## 2019-04-08 ENCOUNTER — Encounter (HOSPITAL_COMMUNITY): Payer: Self-pay | Admitting: Vascular Surgery

## 2019-04-09 ENCOUNTER — Inpatient Hospital Stay (HOSPITAL_COMMUNITY): Payer: Medicare Other | Admitting: Anesthesiology

## 2019-04-09 ENCOUNTER — Inpatient Hospital Stay (HOSPITAL_COMMUNITY): Payer: Medicare Other | Admitting: Physician Assistant

## 2019-04-09 ENCOUNTER — Other Ambulatory Visit: Payer: Self-pay

## 2019-04-09 ENCOUNTER — Inpatient Hospital Stay (HOSPITAL_COMMUNITY)
Admission: RE | Admit: 2019-04-09 | Discharge: 2019-04-11 | DRG: 254 | Disposition: A | Discharge status: Home Health | Payer: Medicare Other | Attending: Vascular Surgery | Admitting: Vascular Surgery

## 2019-04-09 ENCOUNTER — Encounter (HOSPITAL_COMMUNITY)
Admission: RE | Disposition: A | Discharge status: Home Health | Payer: Self-pay | Source: Home / Self Care | Attending: Vascular Surgery

## 2019-04-09 ENCOUNTER — Encounter (HOSPITAL_COMMUNITY): Payer: Self-pay | Admitting: Vascular Surgery

## 2019-04-09 DIAGNOSIS — Z8673 Personal history of transient ischemic attack (TIA), and cerebral infarction without residual deficits: Secondary | ICD-10-CM | POA: Diagnosis not present

## 2019-04-09 DIAGNOSIS — Z79899 Other long term (current) drug therapy: Secondary | ICD-10-CM | POA: Diagnosis not present

## 2019-04-09 DIAGNOSIS — Z8249 Family history of ischemic heart disease and other diseases of the circulatory system: Secondary | ICD-10-CM

## 2019-04-09 DIAGNOSIS — I252 Old myocardial infarction: Secondary | ICD-10-CM

## 2019-04-09 DIAGNOSIS — K219 Gastro-esophageal reflux disease without esophagitis: Secondary | ICD-10-CM | POA: Diagnosis present

## 2019-04-09 DIAGNOSIS — Z951 Presence of aortocoronary bypass graft: Secondary | ICD-10-CM

## 2019-04-09 DIAGNOSIS — I10 Essential (primary) hypertension: Secondary | ICD-10-CM | POA: Diagnosis present

## 2019-04-09 DIAGNOSIS — I2510 Atherosclerotic heart disease of native coronary artery without angina pectoris: Secondary | ICD-10-CM | POA: Diagnosis present

## 2019-04-09 DIAGNOSIS — I739 Peripheral vascular disease, unspecified: Secondary | ICD-10-CM | POA: Diagnosis present

## 2019-04-09 DIAGNOSIS — I724 Aneurysm of artery of lower extremity: Secondary | ICD-10-CM | POA: Diagnosis present

## 2019-04-09 DIAGNOSIS — Z20822 Contact with and (suspected) exposure to covid-19: Secondary | ICD-10-CM | POA: Diagnosis present

## 2019-04-09 HISTORY — PX: VEIN HARVEST: SHX6363

## 2019-04-09 HISTORY — PX: BYPASS GRAFT POPLITEAL TO POPLITEAL: SHX5763

## 2019-04-09 LAB — CBC
HCT: 38.9 % — ABNORMAL LOW (ref 39.0–52.0)
Hemoglobin: 12.1 g/dL — ABNORMAL LOW (ref 13.0–17.0)
MCH: 29.6 pg (ref 26.0–34.0)
MCHC: 31.1 g/dL (ref 30.0–36.0)
MCV: 95.1 fL (ref 80.0–100.0)
Platelets: 251 10*3/uL (ref 150–400)
RBC: 4.09 MIL/uL — ABNORMAL LOW (ref 4.22–5.81)
RDW: 15.5 % (ref 11.5–15.5)
WBC: 8.2 10*3/uL (ref 4.0–10.5)
nRBC: 0 % (ref 0.0–0.2)

## 2019-04-09 LAB — URINALYSIS, ROUTINE W REFLEX MICROSCOPIC
Bacteria, UA: NONE SEEN
Bilirubin Urine: NEGATIVE
Glucose, UA: NEGATIVE mg/dL
Ketones, ur: NEGATIVE mg/dL
Leukocytes,Ua: NEGATIVE
Nitrite: NEGATIVE
Protein, ur: 30 mg/dL — AB
RBC / HPF: >50 RBC/hpf — ABNORMAL HIGH (ref 0–5)
Specific Gravity, Urine: 1.025 (ref 1.005–1.030)
pH: 5 (ref 5.0–8.0)

## 2019-04-09 LAB — PROTIME-INR
INR: 1.1 (ref 0.8–1.2)
Prothrombin Time: 13.9 seconds (ref 11.4–15.2)

## 2019-04-09 LAB — CREATININE, SERUM
Creatinine, Ser: 1 mg/dL (ref 0.61–1.24)
GFR calc Af Amer: >60 mL/min (ref >60)
GFR calc non Af Amer: >60 mL/min (ref >60)

## 2019-04-09 LAB — TYPE AND SCREEN
ABO/RH(D): O POS
Antibody Screen: NEGATIVE

## 2019-04-09 LAB — APTT: aPTT: 31 seconds (ref 24–36)

## 2019-04-09 LAB — POCT ACTIVATED CLOTTING TIME: Activated Clotting Time: 202 seconds

## 2019-04-09 SURGERY — CREATION, BYPASS, ARTERIAL, POPLITEAL
Anesthesia: General | Site: Leg Upper | Laterality: Left

## 2019-04-09 MED ORDER — MORPHINE SULFATE 2 MG/ML IV SOLN
INTRAVENOUS | Status: DC
  Administered 2019-04-09 – 2019-04-10 (×2): 0 mg via INTRAVENOUS
  Administered 2019-04-10: 11 mg via INTRAVENOUS
  Administered 2019-04-10 (×2): 0 mg via INTRAVENOUS
  Administered 2019-04-10: 2 mg via INTRAVENOUS
  Filled 2019-04-09: qty 30

## 2019-04-09 MED ORDER — AMIODARONE HCL 200 MG PO TABS
200.0000 mg | ORAL_TABLET | Freq: Every day | ORAL | Status: DC
  Administered 2019-04-10 – 2019-04-11 (×2): 200 mg via ORAL
  Filled 2019-04-09 (×2): qty 1

## 2019-04-09 MED ORDER — SODIUM CHLORIDE 0.9 % IV SOLN
INTRAVENOUS | Status: AC
  Filled 2019-04-09: qty 1.2

## 2019-04-09 MED ORDER — FLEET ENEMA 7-19 GM/118ML RE ENEM: 1.0000 | ENEMA | Freq: Once | RECTAL | Status: DC | PRN

## 2019-04-09 MED ORDER — LACTATED RINGERS IV SOLN: INTRAVENOUS | Status: DC | PRN

## 2019-04-09 MED ORDER — SODIUM CHLORIDE 0.9 % IV SOLN: 500.0000 mL | Freq: Once | INTRAVENOUS | Status: DC | PRN

## 2019-04-09 MED ORDER — CHLORHEXIDINE GLUCONATE 4 % EX LIQD: 60.0000 mL | Freq: Once | CUTANEOUS | Status: DC

## 2019-04-09 MED ORDER — SODIUM CHLORIDE 0.9% FLUSH: 9.0000 mL | INTRAVENOUS | Status: DC | PRN

## 2019-04-09 MED ORDER — PHENOL 1.4 % MT LIQD: 1.0000 | OROMUCOSAL | Status: DC | PRN

## 2019-04-09 MED ORDER — OXYCODONE HCL 5 MG PO TABS
5.0000 mg | ORAL_TABLET | ORAL | Status: DC | PRN
  Administered 2019-04-09: 10 mg via ORAL
  Administered 2019-04-10: 5 mg via ORAL
  Filled 2019-04-09 (×2): qty 2

## 2019-04-09 MED ORDER — HEPARIN SODIUM (PORCINE) 1000 UNIT/ML IJ SOLN
INTRAMUSCULAR | Status: DC | PRN
  Administered 2019-04-09: 11000 [IU] via INTRAVENOUS
  Administered 2019-04-09: 3000 [IU] via INTRAVENOUS

## 2019-04-09 MED ORDER — MEPERIDINE HCL 25 MG/ML IJ SOLN: 6.2500 mg | INTRAMUSCULAR | Status: DC | PRN

## 2019-04-09 MED ORDER — ACETAMINOPHEN 325 MG PO TABS: 325.0000 mg | ORAL_TABLET | ORAL | Status: DC | PRN

## 2019-04-09 MED ORDER — FENTANYL CITRATE (PF) 100 MCG/2ML IJ SOLN: 25.0000 ug | INTRAMUSCULAR | Status: DC | PRN

## 2019-04-09 MED ORDER — MECLIZINE HCL 25 MG PO TABS: 25.0000 mg | ORAL_TABLET | Freq: Two times a day (BID) | ORAL | Status: DC | PRN

## 2019-04-09 MED ORDER — DIPHENHYDRAMINE HCL 50 MG/ML IJ SOLN: 12.5000 mg | Freq: Four times a day (QID) | INTRAMUSCULAR | Status: DC | PRN

## 2019-04-09 MED ORDER — PROTAMINE SULFATE 10 MG/ML IV SOLN
INTRAVENOUS | Status: DC | PRN
  Administered 2019-04-09: 50 mg via INTRAVENOUS

## 2019-04-09 MED ORDER — LIDOCAINE 2% (20 MG/ML) 5 ML SYRINGE
INTRAMUSCULAR | Status: DC | PRN
  Administered 2019-04-09: 100 mg via INTRAVENOUS

## 2019-04-09 MED ORDER — MELATONIN 3 MG PO TABS
3.0000 mg | ORAL_TABLET | Freq: Every day | ORAL | Status: DC
  Administered 2019-04-09 – 2019-04-10 (×2): 3 mg via ORAL
  Filled 2019-04-09 (×2): qty 1

## 2019-04-09 MED ORDER — ALUM & MAG HYDROXIDE-SIMETH 200-200-20 MG/5ML PO SUSP: 15.0000 mL | ORAL | Status: DC | PRN

## 2019-04-09 MED ORDER — NALOXONE HCL 0.4 MG/ML IJ SOLN: 0.4000 mg | INTRAMUSCULAR | Status: DC | PRN

## 2019-04-09 MED ORDER — LISINOPRIL 10 MG PO TABS
20.0000 mg | ORAL_TABLET | Freq: Every day | ORAL | Status: DC
  Administered 2019-04-10 – 2019-04-11 (×2): 20 mg via ORAL
  Filled 2019-04-09 (×2): qty 2

## 2019-04-09 MED ORDER — HEPARIN SODIUM (PORCINE) 5000 UNIT/ML IJ SOLN
5000.0000 [IU] | Freq: Three times a day (TID) | INTRAMUSCULAR | Status: DC
  Administered 2019-04-09 – 2019-04-11 (×4): 5000 [IU] via SUBCUTANEOUS
  Filled 2019-04-09 (×4): qty 1

## 2019-04-09 MED ORDER — CEFAZOLIN SODIUM-DEXTROSE 2-4 GM/100ML-% IV SOLN
INTRAVENOUS | Status: AC
  Filled 2019-04-09: qty 100

## 2019-04-09 MED ORDER — FENTANYL CITRATE (PF) 100 MCG/2ML IJ SOLN
INTRAMUSCULAR | Status: DC | PRN
  Administered 2019-04-09: 100 ug via INTRAVENOUS
  Administered 2019-04-09 (×5): 50 ug via INTRAVENOUS

## 2019-04-09 MED ORDER — ASPIRIN EC 81 MG PO TBEC
81.0000 mg | DELAYED_RELEASE_TABLET | Freq: Every day | ORAL | Status: DC
  Administered 2019-04-10 – 2019-04-11 (×2): 81 mg via ORAL
  Filled 2019-04-09 (×2): qty 1

## 2019-04-09 MED ORDER — MIDAZOLAM HCL 2 MG/2ML IJ SOLN
INTRAMUSCULAR | Status: DC | PRN
  Administered 2019-04-09: 2 mg via INTRAVENOUS

## 2019-04-09 MED ORDER — PHENYLEPHRINE 40 MCG/ML (10ML) SYRINGE FOR IV PUSH (FOR BLOOD PRESSURE SUPPORT)
PREFILLED_SYRINGE | INTRAVENOUS | Status: DC | PRN
  Administered 2019-04-09 (×4): 80 ug via INTRAVENOUS

## 2019-04-09 MED ORDER — ACETAMINOPHEN 500 MG PO TABS
500.0000 mg | ORAL_TABLET | Freq: Two times a day (BID) | ORAL | Status: DC
  Administered 2019-04-09 – 2019-04-11 (×4): 500 mg via ORAL
  Filled 2019-04-09 (×4): qty 1

## 2019-04-09 MED ORDER — DOCUSATE SODIUM 100 MG PO CAPS
100.0000 mg | ORAL_CAPSULE | Freq: Every day | ORAL | Status: DC
  Administered 2019-04-10 – 2019-04-11 (×2): 100 mg via ORAL
  Filled 2019-04-09 (×2): qty 1

## 2019-04-09 MED ORDER — SODIUM CHLORIDE 0.9 % IV SOLN
INTRAVENOUS | Status: DC | PRN
  Administered 2019-04-09: 500 mL

## 2019-04-09 MED ORDER — POTASSIUM CHLORIDE CRYS ER 10 MEQ PO TBCR
10.0000 meq | EXTENDED_RELEASE_TABLET | Freq: Two times a day (BID) | ORAL | Status: DC
  Administered 2019-04-09 – 2019-04-11 (×4): 10 meq via ORAL
  Filled 2019-04-09 (×6): qty 1

## 2019-04-09 MED ORDER — PANTOPRAZOLE SODIUM 40 MG PO TBEC
40.0000 mg | DELAYED_RELEASE_TABLET | Freq: Every day | ORAL | Status: DC
  Administered 2019-04-10 – 2019-04-11 (×2): 40 mg via ORAL
  Filled 2019-04-09 (×2): qty 1

## 2019-04-09 MED ORDER — EPHEDRINE SULFATE-NACL 50-0.9 MG/10ML-% IV SOSY
PREFILLED_SYRINGE | INTRAVENOUS | Status: DC | PRN
  Administered 2019-04-09: 10 mg via INTRAVENOUS

## 2019-04-09 MED ORDER — POTASSIUM CHLORIDE CRYS ER 20 MEQ PO TBCR: 20.0000 meq | EXTENDED_RELEASE_TABLET | Freq: Every day | ORAL | Status: DC | PRN

## 2019-04-09 MED ORDER — FUROSEMIDE 20 MG PO TABS
20.0000 mg | ORAL_TABLET | Freq: Two times a day (BID) | ORAL | Status: DC
  Administered 2019-04-09 – 2019-04-11 (×4): 20 mg via ORAL
  Filled 2019-04-09 (×3): qty 1

## 2019-04-09 MED ORDER — MIDAZOLAM HCL 2 MG/2ML IJ SOLN
INTRAMUSCULAR | Status: AC
  Filled 2019-04-09: qty 2

## 2019-04-09 MED ORDER — PHENYLEPHRINE 40 MCG/ML (10ML) SYRINGE FOR IV PUSH (FOR BLOOD PRESSURE SUPPORT)
PREFILLED_SYRINGE | INTRAVENOUS | Status: AC
  Filled 2019-04-09: qty 10

## 2019-04-09 MED ORDER — ACETAMINOPHEN 325 MG RE SUPP: 325.0000 mg | RECTAL | Status: DC | PRN

## 2019-04-09 MED ORDER — ROCURONIUM BROMIDE 10 MG/ML (PF) SYRINGE
PREFILLED_SYRINGE | INTRAVENOUS | Status: DC | PRN
  Administered 2019-04-09: 20 mg via INTRAVENOUS

## 2019-04-09 MED ORDER — CARVEDILOL 25 MG PO TABS
25.0000 mg | ORAL_TABLET | Freq: Two times a day (BID) | ORAL | Status: DC
  Administered 2019-04-09 – 2019-04-11 (×4): 25 mg via ORAL
  Filled 2019-04-09 (×4): qty 1

## 2019-04-09 MED ORDER — GUAIFENESIN-DM 100-10 MG/5ML PO SYRP: 15.0000 mL | ORAL_SOLUTION | ORAL | Status: DC | PRN

## 2019-04-09 MED ORDER — LIDOCAINE 2% (20 MG/ML) 5 ML SYRINGE
INTRAMUSCULAR | Status: AC
  Filled 2019-04-09: qty 5

## 2019-04-09 MED ORDER — PROPOFOL 10 MG/ML IV BOLUS
INTRAVENOUS | Status: AC
  Filled 2019-04-09: qty 20

## 2019-04-09 MED ORDER — FENTANYL CITRATE (PF) 250 MCG/5ML IJ SOLN
INTRAMUSCULAR | Status: AC
  Filled 2019-04-09: qty 5

## 2019-04-09 MED ORDER — ONDANSETRON HCL 4 MG/2ML IJ SOLN: 4.0000 mg | Freq: Once | INTRAMUSCULAR | Status: DC | PRN

## 2019-04-09 MED ORDER — PHENYLEPHRINE HCL-NACL 10-0.9 MG/250ML-% IV SOLN
INTRAVENOUS | Status: DC | PRN
  Administered 2019-04-09: 50 ug/min via INTRAVENOUS

## 2019-04-09 MED ORDER — SENNOSIDES-DOCUSATE SODIUM 8.6-50 MG PO TABS: 1.0000 | ORAL_TABLET | Freq: Every evening | ORAL | Status: DC | PRN

## 2019-04-09 MED ORDER — ROCURONIUM BROMIDE 10 MG/ML (PF) SYRINGE
PREFILLED_SYRINGE | INTRAVENOUS | Status: AC
  Filled 2019-04-09: qty 10

## 2019-04-09 MED ORDER — ONDANSETRON HCL 4 MG/2ML IJ SOLN
INTRAMUSCULAR | Status: DC | PRN
  Administered 2019-04-09: 4 mg via INTRAVENOUS

## 2019-04-09 MED ORDER — CEFAZOLIN SODIUM-DEXTROSE 2-4 GM/100ML-% IV SOLN
2.0000 g | INTRAVENOUS | Status: AC
  Administered 2019-04-09: 08:00:00 2 g via INTRAVENOUS

## 2019-04-09 MED ORDER — PROPOFOL 10 MG/ML IV BOLUS
INTRAVENOUS | Status: DC | PRN
  Administered 2019-04-09: 150 mg via INTRAVENOUS

## 2019-04-09 MED ORDER — HYDROMORPHONE HCL 1 MG/ML IJ SOLN
0.5000 mg | INTRAMUSCULAR | Status: DC | PRN
  Administered 2019-04-09 (×2): 1 mg via INTRAVENOUS
  Filled 2019-04-09 (×2): qty 1

## 2019-04-09 MED ORDER — BISACODYL 5 MG PO TBEC: 5.0000 mg | DELAYED_RELEASE_TABLET | Freq: Every day | ORAL | Status: DC | PRN

## 2019-04-09 MED ORDER — DIPHENHYDRAMINE HCL 12.5 MG/5ML PO ELIX
12.5000 mg | ORAL_SOLUTION | Freq: Four times a day (QID) | ORAL | Status: DC | PRN
  Filled 2019-04-09: qty 5

## 2019-04-09 MED ORDER — SODIUM CHLORIDE 0.9 % IV SOLN: INTRAVENOUS | Status: DC

## 2019-04-09 MED ORDER — ALPRAZOLAM 0.25 MG PO TABS
0.2500 mg | ORAL_TABLET | Freq: Two times a day (BID) | ORAL | Status: DC
  Administered 2019-04-09 – 2019-04-11 (×4): 0.25 mg via ORAL
  Filled 2019-04-09 (×4): qty 1

## 2019-04-09 MED ORDER — ATORVASTATIN CALCIUM 80 MG PO TABS
80.0000 mg | ORAL_TABLET | Freq: Every day | ORAL | Status: DC
  Administered 2019-04-09 – 2019-04-10 (×2): 80 mg via ORAL
  Filled 2019-04-09 (×2): qty 1

## 2019-04-09 MED ORDER — SUCCINYLCHOLINE CHLORIDE 200 MG/10ML IV SOSY
PREFILLED_SYRINGE | INTRAVENOUS | Status: AC
  Filled 2019-04-09: qty 10

## 2019-04-09 MED ORDER — ONDANSETRON HCL 4 MG/2ML IJ SOLN: 4.0000 mg | Freq: Four times a day (QID) | INTRAMUSCULAR | Status: DC | PRN

## 2019-04-09 MED ORDER — SUCCINYLCHOLINE CHLORIDE 20 MG/ML IJ SOLN
INTRAMUSCULAR | Status: DC | PRN
  Administered 2019-04-09: 100 mg via INTRAVENOUS

## 2019-04-09 MED ORDER — DEXAMETHASONE SODIUM PHOSPHATE 10 MG/ML IJ SOLN
INTRAMUSCULAR | Status: DC | PRN
  Administered 2019-04-09: 10 mg via INTRAVENOUS

## 2019-04-09 MED ORDER — MAGNESIUM SULFATE 2 GM/50ML IV SOLN: 2.0000 g | Freq: Every day | INTRAVENOUS | Status: DC | PRN

## 2019-04-09 MED ORDER — 0.9 % SODIUM CHLORIDE (POUR BTL) OPTIME
TOPICAL | Status: DC | PRN
  Administered 2019-04-09: 2000 mL

## 2019-04-09 MED ORDER — LABETALOL HCL 5 MG/ML IV SOLN: 10.0000 mg | INTRAVENOUS | Status: DC | PRN

## 2019-04-09 MED ORDER — METOPROLOL TARTRATE 5 MG/5ML IV SOLN: 2.0000 mg | INTRAVENOUS | Status: DC | PRN

## 2019-04-09 MED ORDER — CEFAZOLIN SODIUM-DEXTROSE 2-4 GM/100ML-% IV SOLN
2.0000 g | Freq: Three times a day (TID) | INTRAVENOUS | Status: AC
  Administered 2019-04-09 – 2019-04-10 (×2): 2 g via INTRAVENOUS
  Filled 2019-04-09 (×2): qty 100

## 2019-04-09 MED ORDER — HYDRALAZINE HCL 20 MG/ML IJ SOLN: 5.0000 mg | INTRAMUSCULAR | Status: DC | PRN

## 2019-04-09 SURGICAL SUPPLY — 65 items
BANDAGE ESMARK 6X9 LF (GAUZE/BANDAGES/DRESSINGS) IMPLANT
BNDG ESMARK 6X9 LF (GAUZE/BANDAGES/DRESSINGS)
CANISTER SUCT 3000ML PPV (MISCELLANEOUS) ×2 IMPLANT
CANNULA VESSEL 3MM 2 BLNT TIP (CANNULA) ×2 IMPLANT
CLIP VESOCCLUDE MED 24/CT (CLIP) ×2 IMPLANT
CLIP VESOCCLUDE SM WIDE 24/CT (CLIP) ×2 IMPLANT
COVER WAND RF STERILE (DRAPES) IMPLANT
CUFF TOURN SGL QUICK 24 (TOURNIQUET CUFF)
CUFF TOURN SGL QUICK 34 (TOURNIQUET CUFF)
CUFF TOURN SGL QUICK 42 (TOURNIQUET CUFF) IMPLANT
CUFF TRNQT CYL 24X4X16.5-23 (TOURNIQUET CUFF) IMPLANT
CUFF TRNQT CYL 34X4.125X (TOURNIQUET CUFF) IMPLANT
DERMABOND ADVANCED (GAUZE/BANDAGES/DRESSINGS) ×1
DERMABOND ADVANCED .7 DNX12 (GAUZE/BANDAGES/DRESSINGS) ×1 IMPLANT
DRAIN CHANNEL 15F RND FF W/TCR (WOUND CARE) IMPLANT
DRAPE HALF SHEET 40X57 (DRAPES) IMPLANT
DRAPE X-RAY CASS 24X20 (DRAPES) IMPLANT
ELECT REM PT RETURN 9FT ADLT (ELECTROSURGICAL) ×2
ELECTRODE REM PT RTRN 9FT ADLT (ELECTROSURGICAL) ×1 IMPLANT
EVACUATOR SILICONE 100CC (DRAIN) IMPLANT
GLOVE BIO SURGEON STRL SZ7.5 (GLOVE) ×4 IMPLANT
GLOVE BIOGEL PI IND STRL 6.5 (GLOVE) ×8 IMPLANT
GLOVE BIOGEL PI IND STRL 7.5 (GLOVE) ×1 IMPLANT
GLOVE BIOGEL PI IND STRL 8 (GLOVE) ×1 IMPLANT
GLOVE BIOGEL PI INDICATOR 6.5 (GLOVE) ×8
GLOVE BIOGEL PI INDICATOR 7.5 (GLOVE) ×1
GLOVE BIOGEL PI INDICATOR 8 (GLOVE) ×1
GLOVE ECLIPSE 6.5 STRL STRAW (GLOVE) ×4 IMPLANT
GLOVE ECLIPSE 7.0 STRL STRAW (GLOVE) ×2 IMPLANT
GOWN STRL REUS W/ TWL LRG LVL3 (GOWN DISPOSABLE) ×6 IMPLANT
GOWN STRL REUS W/ TWL XL LVL3 (GOWN DISPOSABLE) ×1 IMPLANT
GOWN STRL REUS W/TWL LRG LVL3 (GOWN DISPOSABLE) ×6
GOWN STRL REUS W/TWL XL LVL3 (GOWN DISPOSABLE) ×1
HEMOSTAT SPONGE AVITENE ULTRA (HEMOSTASIS) IMPLANT
INSERT FOGARTY SM (MISCELLANEOUS) IMPLANT
KIT BASIN OR (CUSTOM PROCEDURE TRAY) ×2 IMPLANT
KIT TURNOVER KIT B (KITS) ×2 IMPLANT
NS IRRIG 1000ML POUR BTL (IV SOLUTION) ×4 IMPLANT
PACK PERIPHERAL VASCULAR (CUSTOM PROCEDURE TRAY) ×2 IMPLANT
PAD ARMBOARD 7.5X6 YLW CONV (MISCELLANEOUS) ×4 IMPLANT
SET COLLECT BLD 21X3/4 12 (NEEDLE) IMPLANT
STOPCOCK 4 WAY LG BORE MALE ST (IV SETS) IMPLANT
SUT ETHILON 3 0 PS 1 (SUTURE) IMPLANT
SUT GORETEX 5 0 TT13 24 (SUTURE) IMPLANT
SUT GORETEX 6.0 TT13 (SUTURE) IMPLANT
SUT MNCRL AB 4-0 PS2 18 (SUTURE) ×8 IMPLANT
SUT PROLENE 5 0 C 1 24 (SUTURE) ×6 IMPLANT
SUT PROLENE 6 0 BV (SUTURE) ×4 IMPLANT
SUT PROLENE 7 0 BV 1 (SUTURE) IMPLANT
SUT SILK 0 TIES 10X30 (SUTURE) ×2 IMPLANT
SUT SILK 2 0 (SUTURE) ×1
SUT SILK 2 0 PERMA HAND 18 BK (SUTURE) IMPLANT
SUT SILK 2-0 18XBRD TIE 12 (SUTURE) ×1 IMPLANT
SUT SILK 3 0 (SUTURE) ×3
SUT SILK 3-0 18XBRD TIE 12 (SUTURE) ×3 IMPLANT
SUT VIC AB 2-0 CT1 27 (SUTURE) ×2
SUT VIC AB 2-0 CT1 TAPERPNT 27 (SUTURE) ×2 IMPLANT
SUT VIC AB 3-0 SH 27 (SUTURE) ×5
SUT VIC AB 3-0 SH 27X BRD (SUTURE) ×5 IMPLANT
TAPE UMBILICAL COTTON 1/8X30 (MISCELLANEOUS) ×2 IMPLANT
TOWEL GREEN STERILE (TOWEL DISPOSABLE) ×2 IMPLANT
TRAY FOLEY MTR SLVR 16FR STAT (SET/KITS/TRAYS/PACK) ×2 IMPLANT
TUBING EXTENTION W/L.L. (IV SETS) IMPLANT
UNDERPAD 30X30 (UNDERPADS AND DIAPERS) ×2 IMPLANT
WATER STERILE IRR 1000ML POUR (IV SOLUTION) ×2 IMPLANT

## 2019-04-09 NOTE — Anesthesia Procedure Notes (Signed)
Arterial Line Insertion Start/End1/07/2019 8:15 AM, 04/09/2019 8:20 AM Performed by: Barrington Ellison, CRNA, CRNA  Patient location: OR. Patient sedated Left, radial was placed Catheter size: 20 G Hand hygiene performed   Attempts: 1 Procedure performed without using ultrasound guided technique. Following insertion, dressing applied and Biopatch. Post procedure assessment: normal  Patient tolerated the procedure well with no immediate complications.

## 2019-04-09 NOTE — Progress Notes (Signed)
[facility]  Subjective  - pain issues.   Objective 119/70 63 98 F (36.7 C) (Oral) 15 94%  Intake/Output Summary (Last 24 hours) at 04/09/2019 1636 Last data filed at 04/09/2019 1145 Gross per 24 hour  Intake 1500 ml  Output 340 ml  Net 1160 ml    Palpable DP left LE Incisions all healing well without hematoma Lungs non labored   Assessment/Planning: S/P above knee to below knee popliteal bypass. Post op stable disposition with palpable pedal pulse.    Roxy Horseman 04/09/2019 4:36 PM --  Laboratory Lab Results: Recent Labs    04/09/19 1215  WBC 8.2  HGB 12.1*  HCT 38.9*  PLT 251   BMET Recent Labs    04/09/19 1215  CREATININE 1.00    COAG Lab Results  Component Value Date   INR 1.1 04/09/2019   INR 1.03 03/23/2018   INR 1.18 03/17/2018   No results found for: PTT

## 2019-04-09 NOTE — Anesthesia Postprocedure Evaluation (Signed)
Anesthesia Post Note  Patient: Julian Miranda  Procedure(s) Performed: BYPASS  ABOVE KNEE POPLITEAL TO BELOW KNEE POPLITEAL AND LIGATION OF LEFT POPITEAL ANEURYSM (Left Leg Upper) Harvest Greater Saphenous Vein and  Revise Elisa Lateral Saphenous Vein  (Left Leg Upper)     Patient location during evaluation: PACU Anesthesia Type: General Level of consciousness: awake and alert and oriented Pain management: pain level controlled Vital Signs Assessment: post-procedure vital signs reviewed and stable Respiratory status: spontaneous breathing, nonlabored ventilation, respiratory function stable and patient connected to nasal cannula oxygen Cardiovascular status: blood pressure returned to baseline and stable Postop Assessment: no apparent nausea or vomiting Anesthetic complications: no    Last Vitals:  Vitals:   04/09/19 0656 04/09/19 1152  BP: 124/63 (!) 145/66  Pulse: 64 71  Resp: 17 18  Temp: 36.4 C (!) 36 C  SpO2: 94% 97%    Last Pain:  Vitals:   04/09/19 1152  TempSrc:   PainSc: Asleep                 Paton Crum A.

## 2019-04-09 NOTE — Op Note (Signed)
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  Harvest of left great saphenous vein 2.  Left above-knee to below-knee popliteal artery bypass with reversed ipsilateral great saphenous vein 3.  Ligation of left popliteal artery aneurysm  Surgeon: Dr. 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  He subsequently had a left popliteal artery aneurysm that we have been monitoring with surveillance.  Ultimately this recently grew from 1.8 to 2.2 cm.  He presents today for left popliteal artery aneurysm repair with bypass and exclusion after risk and benefits were discussed.  Findings: Left great saphenous vein was only adequate from the saphenofemoral junction to the mid thigh.  We had just enough vein for a reversed above knee to below-knee popliteal artery bypass.  The native popliteal artery was then ligated with 0 silk distal to the bypass in the above-knee popliteal artery and proximal to the bypass in the below knee popliteal artery for exclusion of the aneurysm.  He has a palpable posterior tibial pulse at the left foot upon completion of the bypass.  Anesthesia: General  Details: Patient was taken to the operating room after informed consent was obtained.  He was placed on operative table in supine position.  General endotracheal anesthesia was induced.  Bilateral lower extremities were then prepped and draped in usual sterile fashion.  I had already marked out the saphenous vein his left thigh in preoperative holding. Initially made two skip incisions in the left upper thigh and through this the saphenous vein was circumferentially mobilized with Bovie cautery and Metzenbaum scissors.  All branches were ligated between 3-0 silk ties and divided.  Ultimately this was  mobilized all the way to the saphenofemoral junction that was then controlled with 2-0 silk tie and transected.  He did have a separate saphenous vein that ran more posterior in the thigh but it was too small for bypass conduit.  The saphenous vein that we were harvesting ultimately bifurcated in the distal thigh and I did not feel it was usable distally given it became less than 2 mm in size.  Ultimately the vein was then ligated between 2-0 silk ties and divided and then passed off the field.  We placed a vessel cannula in the fistula after it was reversed, flushed it, and it dilated very nicely.  There were no side branches that required repair. I then went and made an incision above the knee just anterior to sartorius.  Ultimately through this incision we then mobilized sartorius posteriorly and ultimately the distal SFA/above-knee popliteal artery was identified and mobilized off the adjacent veins.  I was able to get Vesseloops proximally distally.  I did chase this all the way to where it became aneurysmal distally.  I then went to the below-knee and made a longitudinal incision medially just one fingerbreadth posterior to the tibia.  Through this incision I then took down the subcutaneous tissue with Bovie cautery and opened the fascia and entered the popliteal space.  The popliteal artery and vein were then identified.  I used Meyerding retractors in order to get better visualization.  The popliteal artery was then mobilized off the adjacent veins and controlled Vesseloops proximally and distally.  At that point time I then used my fingers for blunt dissection to tunnel from the below-knee to  above-knee popliteal space passed a aortic clamp and then a umbilical tape.  That point in time the patient was then given 100 units/kg heparin.  ACT was checked to maintain greater than 250.  Additional 3000 units of IV heparin was given during the case.  I used a baby profunda clamp distally with a vessel loop  proximaly and controlled the above-knee popliteal artery above the aneurysm.  This was then opened with 11 blade scalpel extended with Potts scissors.  The vein was reversed spatulated.  A end-to-side anastomosis performed with 5-0 Prolene parachute format.  That point time we pressurized the vein and had excellent pulsatile inflow.  There were no leaks that required repair.  The vein was then marked for orientation to ensure there was no twisting.  I then tunneled this behind the knee to the below-knee popliteal artery ensuring to maintain proper orientation.  That point in time I then used baby profunda clamps on the below-knee popliteal artery for vascular control.  The artery was opened with 11 blade scalpel extended Potts scissors.  There was good backbleeding from the artery distally.  The leg was straightened and the vein was pulled to the appropriate length and cut.  The vein was spatulated and sewed end-to-side anastomosis was performed with 5-0 Prolene to the below knee popliteal artery.  We de-aired everything prior to completion.  Once we came off clamps there was excellent dorsalis pedis and posterior tibial signal in the left foot.  This was while I still had the aneurysm excluded with the clamps above and below the aneurysm.  That point in time I listened with Doppler in the below-knee popliteal artery and there was triphasic flow that completely went away when the bypass was clamped.  That point in time I then used several 0 silk ties on the below-knee popliteal artery proximal to our bypass and the native below-knee popliteal artery was ligated.  I then went to the above-knee popliteal artery and ligated the native above-knee popliteal artery distal to the bypass with several 0 silk ties.  This allowed the aneurysm to be excluded proximally and distally with flow down the leg for perfusion of the left lower extremity via the bypass.  After ligation we checked again with a Doppler still a triphasic  flow in the popliteal artery as well as triphasic flow in the dorsalis pedis and posterior tibial artery.  Patient was given 50 mg protamine for reversal.  All incision was copiously irrigated.  The two saphenectomy incisions were run closed with 3-0 Vicryl and 4-0 Monocryl in the skin and Dermabond.  The above and below-knee popliteal artery incisions were then closed with 2-0 Vicryl, 3-0 Vicryl, 4-0 Monocryl in the skin and Dermabond.  He had a palpable posterior tibial pulse at the end of the case.  He was taken to the PACU in stable condition.  Complication: None  Condition: Stable  Marty Heck, MD [facility] Office: [phone] Pager: [place]

## 2019-04-09 NOTE — Anesthesia Procedure Notes (Signed)
Procedure Name: Intubation Date/Time: 04/09/2019 7:57 AM Performed by: Barrington Ellison, CRNA Pre-anesthesia Checklist: Patient identified, Emergency Drugs available, Suction available and Patient being monitored Patient Re-evaluated:Patient Re-evaluated prior to induction Oxygen Delivery Method: Circle System Utilized Preoxygenation: Pre-oxygenation with 100% oxygen Induction Type: IV induction and Rapid sequence Ventilation: Oral airway inserted - appropriate to patient size and Two handed mask ventilation required Laryngoscope Size: Glidescope and 4 Grade View: Grade I Tube type: Oral Tube size: 7.5 mm Number of attempts: 1 Airway Equipment and Method: Stylet and Oral airway Placement Confirmation: ETT inserted through vocal cords under direct vision,  positive ETCO2 and breath sounds checked- equal and bilateral Secured at: 22 cm Tube secured with: Tape Dental Injury: Teeth and Oropharynx as per pre-operative assessment  Difficulty Due To: Difficulty was anticipated, Difficult Airway- due to limited oral opening and Difficult Airway- due to large tongue Comments: Elective glidescope used due to previous difficulty with DL intubation, small mouth opening, large tongue. Performed by Magnus Sinning. Supervised by Dr. Royce Macadamia

## 2019-04-09 NOTE — H&P (Signed)
History and Physical Interval Note:  04/09/2019 7:19 AM  Julian Miranda  has presented today for surgery, with the diagnosis of left popliteal aneurysm.  The various methods of treatment have been discussed with the patient and family. After consideration of risks, benefits and other options for treatment, the patient has consented to  Procedure(s): BYPASS GRAFT ABOVE KNEE POPLITEAL TO BELOW KNEE POPLITEAL (Left) as a surgical intervention.  The patient's history has been reviewed, patient examined, no change in status, stable for surgery.  I have reviewed the patient's chart and labs.  Questions were answered to the patient's satisfaction.    Left lower extremity bypass with exclusion of popliteal aneurysm.  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  Also been following a left popliteal aneurysm.  He states since his last follow-up he had a MI in June.  Ultimately had to be emergently transferred to [facility] and had four-vessel CABG.  He is currently finishing cardiac rehab at this time.  No issues with the right leg.  States he feels good whenever he walks.  No pain behind the right knee and that has all resolved.  No issues with the left leg either.  His CABG they used internal mammary according to the patient and did not take vein from his left leg.       Past Medical History:  Diagnosis Date  . CAD (coronary artery disease)   . GERD (gastroesophageal reflux disease)   . Hypertension   . Myocardial  infarction ([place]) 1994  . Peripheral vascular disease ([place])   . Stroke [facility])          Past Surgical History:  Procedure Laterality Date  . BYPASS GRAFT POPLITEAL TO POPLITEAL Right 03/23/2018   Procedure: BYPASS GRAFT RIGHT ABOVE KNEE POPLITEAL TO BELOW KNEE POPLITEAL ARTERY  USING RIGHT GREAT SAPHENOUS VEIN;  Surgeon: Julian Heck, MD;  Location: [place];  Service: Vascular;  Laterality: Right;  . COLONOSCOPY WITH ESOPHAGOGASTRODUODENOSCOPY (EGD)    . CORONARY ARTERY BYPASS GRAFT    . EYE SURGERY Bilateral    cataracts  . FEMORAL BYPASS Right 03/23/2018  . HERNIA REPAIR    . MASTOIDECTOMY    . ROTATOR CUFF REPAIR Right   . VEIN HARVEST Right 03/23/2018   Procedure: VEIN HARVEST RIGHT GREAT SAPHENOUS;  Surgeon: Julian Heck, MD;  Location: [facility] OR;  Service: Vascular;  Laterality: Right;         Family History  Problem Relation Age of Onset  . Heart disease Mother     SOCIAL HISTORY: Social History        Tobacco Use  . Smoking status: Never Smoker  . Smokeless tobacco: Never Used  Substance Use Topics  . Alcohol use: Never    Frequency: Never         Allergies  Allergen Reactions  . Other   . Vancomycin     Other reaction(s): Red Man Syndrome Noted  intraoperatively on 10/04/2018. No associated hemodynamic or respiratory changes. Please give slowly.          Current Outpatient Medications  Medication Sig Dispense Refill  . acetaminophen (TYLENOL) 325 MG tablet Take by mouth.    Marland Kitchen amiodarone (PACERONE) 200 MG tablet Take by mouth.    Marland Kitchen aspirin 81 MG tablet Take 81 mg by mouth daily.    Marland Kitchen atorvastatin (LIPITOR) 10 MG tablet Take 10 mg by mouth at bedtime.     . carvedilol (COREG) 12.5 MG tablet Take 12.5 mg by mouth 2 (two) times daily with a meal.    . furosemide (LASIX) 20 MG tablet Take by mouth.    . meclizine (ANTIVERT) 25 MG tablet Take 25 mg by mouth 2 (two) times daily as needed for dizziness.      . Multiple Minerals (CALCIUM/MAGNESIUM/ZINC PO) Take 1 tablet by mouth at bedtime.    Marland Kitchen omeprazole (PRILOSEC) 20 MG capsule Take 20 mg by mouth 2 (two) times daily.     . potassium chloride (KLOR-CON) 10 MEQ tablet Take by mouth.     No current facility-administered medications for this visit.     REVIEW OF SYSTEMS:  [X]  denotes positive finding, [ ]  denotes negative finding Cardiac  Comments:  Chest pain or chest pressure:    Shortness of breath upon exertion:    Short of breath when lying flat:    Irregular heart rhythm:        Vascular    Pain in calf, thigh, or hip brought on by ambulation:    Pain in feet at night that wakes you up from your sleep:     Blood clot in your veins:    Leg swelling:         Pulmonary    Oxygen at home:    Productive cough:     Wheezing:         Neurologic    Sudden weakness in arms or legs:     Sudden numbness in arms or legs:     Sudden onset of difficulty speaking or slurred speech:    Temporary loss of vision in one eye:     Problems with dizziness:         Gastrointestinal    Blood in stool:     Vomited blood:         Genitourinary    Burning when urinating:     Blood in urine:        Psychiatric    Major depression:         Hematologic    Bleeding problems:    Problems with blood clotting too easily:        Skin    Rashes or ulcers:        Constitutional    Fever or chills:      PHYSICAL EXAM:    Vitals:   02/20/19 1046  BP: (!) 142/87  Pulse: 65  Resp: 20  Temp: 98.2 F (36.8 C)  SpO2: 96%  Weight: 233 lb (105.7 kg)  Height: 5\' 10"  (1.778 m)    GENERAL: The patient is a well-nourished male, in no acute distress. The vital signs are documented above. CARDIAC: There is a regular rate and rhythm.  VASCULAR:  Right leg incisions healed Right DP pulse 2+ palpable Left DP pulse 2+  palpable PULMONARY: There is good air exchange bilaterally without wheezing or rales. ABDOMEN: Soft and non-tender with normal pitched bowel sounds.  MUSCULOSKELETAL: There  are no major deformities or cyanosis. NEUROLOGIC: No focal weakness or paresthesias are detected.  DATA:  Arterial duplex shows a widely patent right above the knee to below-knee popliteal bypass with triphasic waveforms and excellent velocities.  Aneurysm continues to decrease in size and now 3.02 cm from 3.39 cm in 08/2018.  Left popliteal aneurysm is increased from 1.88 to 2.12 cm.  Assessment/Plan:  69 year old male now status post right distal SFA to below-knee pop bypass with exclusion of right popliteal artery aneurysm greater than 4 cm on 03/23/2018.  Right lower extremity bypass appears widely patent.  Aneurysm continues to decrease in size and now measures about 3 cm .  Compressive symptoms resolved.  Left popliteal aneurysm has now increased from 1.88 to 2.12 cm.  Discussed plan for left popliteal aneurysm repair in the near future.  Certainly does not need to be done emergently given slow growth.  I will let him complete his cardiac rehab and fully recover from his CABG.  He wants to schedule after the holidays the first of the year which I think is fine.  He has already had vein mapping complete.  Risk and benefits discussed with patient.  He will call back to schedule a date early next year.  Julian Heck, MD [facility] [place] Office: [phone] Pager: [place]

## 2019-04-09 NOTE — Transfer of Care (Signed)
Immediate Anesthesia Transfer of Care Note  Patient: Julian Miranda  Procedure(s) Performed: BYPASS  ABOVE KNEE POPLITEAL TO BELOW KNEE POPLITEAL AND LIGATION OF LEFT POPITEAL ANEURYSM (Left Leg Upper) Harvest Greater Saphenous Vein and  Revise Elisa Lateral Saphenous Vein  (Left Leg Upper)  Patient Location: PACU  Anesthesia Type:General  Level of Consciousness: awake  Airway & Oxygen Therapy: Patient Spontanous Breathing and Patient connected to face mask oxygen  Post-op Assessment: Report given to RN and Patient moving all extremities X 4  Post vital signs: Reviewed and stable  Last Vitals:  Vitals Value Taken Time  BP    Temp    Pulse    Resp    SpO2      Last Pain:  Vitals:   04/09/19 0656  TempSrc: Oral  PainSc: 0-No pain      Patients Stated Pain Goal: 3 ([id number])  Complications: No apparent anesthesia complications

## 2019-04-09 NOTE — Progress Notes (Signed)
PT received from PACU. Pt c/x/ox4. Pt given chg bath. Telebox 18 applied/ccmd notified. Pt denies complaints. Vitals stable. Call bell within reach and pt oriented to room. Will continue to monitor.  Jerald Kief, RN

## 2019-04-10 ENCOUNTER — Encounter (HOSPITAL_COMMUNITY): Payer: Medicare Other

## 2019-04-10 LAB — BASIC METABOLIC PANEL
Anion gap: 10 (ref 5–15)
BUN: 16 mg/dL (ref 8–23)
CO2: 27 mmol/L (ref 22–32)
Calcium: 8.8 mg/dL — ABNORMAL LOW (ref 8.9–10.3)
Chloride: 99 mmol/L (ref 98–111)
Creatinine, Ser: 0.94 mg/dL (ref 0.61–1.24)
GFR calc Af Amer: >60 mL/min (ref >60)
GFR calc non Af Amer: >60 mL/min (ref >60)
Glucose, Bld: 130 mg/dL — ABNORMAL HIGH (ref 70–99)
Potassium: 4.5 mmol/L (ref 3.5–5.1)
Sodium: 136 mmol/L (ref 135–145)

## 2019-04-10 LAB — CBC
HCT: 36.2 % — ABNORMAL LOW (ref 39.0–52.0)
Hemoglobin: 11.2 g/dL — ABNORMAL LOW (ref 13.0–17.0)
MCH: 29.7 pg (ref 26.0–34.0)
MCHC: 30.9 g/dL (ref 30.0–36.0)
MCV: 96 fL (ref 80.0–100.0)
Platelets: 269 10*3/uL (ref 150–400)
RBC: 3.77 MIL/uL — ABNORMAL LOW (ref 4.22–5.81)
RDW: 15.6 % — ABNORMAL HIGH (ref 11.5–15.5)
WBC: 11.2 10*3/uL — ABNORMAL HIGH (ref 4.0–10.5)
nRBC: 0 % (ref 0.0–0.2)

## 2019-04-10 NOTE — Plan of Care (Signed)
Poc progressing.  

## 2019-04-10 NOTE — Evaluation (Signed)
Physical Therapy Evaluation Patient Details Name: Julian Miranda MRN: [id number] DOB: [birth year] Today's Date: 04/10/2019   History of Present Illness  69 yo male s/p above knee popliteal to below knee popliteal and ligation of L popiteal aneurysm L upper leg harvest greater saphenous vein and revise elisa lateral saphenous vein L LE. on 1/4/ 21 PMH 03/2018 R sital SFA below knee pop bypass with reversed saphenous vein for > 4 cm r popliteal aneurysm. MI 06/20 with CABG at [facility] / 19 CAD GERD HTN CVA PVD R rotator cuff repair hernia repair  Clinical Impression  Pt in bed upon PT arrival, agreeable to PT session at this time. The pt demonstrates slight limitations in functional mobility compared to his baseline due to dx above, and reports increased pain at surgical site with mobility, especially stairs. The pt was able to demo good stability with use of RW for ambulation, and may be able to progress to no AD with increased practice and repetition. The pt will be safe to d/c home with supervision and assist, but will continue to benefit from skilled PT acutely, and following d/c to maximize functional mobility and independence.   Vitals: Pt SpO2 95% on RA at rest, SpO2 94-98% on RA during ambulation, therefore pt left on RA after PT session. After 20 min, pt SpO2 noted to be fluctuating 82-90% on RA, so pt put back on 1L O2.    Follow Up Recommendations Home health PT;Supervision/Assistance - 24 hour    Equipment Recommendations  (pt has needed equipment)    Recommendations for Other Services       Precautions / Restrictions Precautions Precautions: None Restrictions Weight Bearing Restrictions: No      Mobility  Bed Mobility Overal bed mobility: Needs Assistance Bed Mobility: Supine to Sit;Sit to Supine     Supine to sit: Supervision Sit to supine: Supervision   General bed mobility comments: increased time, + rail, no physical assist required  Transfers Overall transfer  level: Needs assistance Equipment used: Rolling walker (2 wheeled) Transfers: Sit to/from Stand Sit to Stand: Min guard         General transfer comment: min guard for safety/balance  Ambulation/Gait Ambulation/Gait assistance: Supervision Gait Distance (Feet): 100 Feet(100 ft x2) Assistive device: Rolling walker (2 wheeled) Gait Pattern/deviations: Step-through pattern;Decreased stance time - left Gait velocity: 0.44 m/s Gait velocity interpretation: 1.31 - 2.62 ft/sec, indicative of limited community ambulator General Gait Details: pt with slow but steady gait speed, no LOB. Used RW due to reports of feeling "unsteady" but should be able to progress to no AD  Stairs Stairs: Yes Stairs assistance: Supervision Stair Management: One rail Left;Forwards Number of Stairs: 5 General stair comments: 1 step x 5 with supervision for safety, no LOB. Pt reports increased pain with stairs due tostretch on incision, but demos good stability  Wheelchair Mobility    Modified Rankin (Stroke Patients Only)       Balance Overall balance assessment: Needs assistance Sitting-balance support: No upper extremity supported;Feet supported Sitting balance-Leahy Scale: Good Sitting balance - Comments: mod I   Standing balance support: No upper extremity supported;During functional activity;Bilateral upper extremity supported Standing balance-Leahy Scale: Fair Standing balance comment: preference to UE support but min guard without UE support ambulation                             Pertinent Vitals/Pain Pain Assessment: 0-10 Pain Score: 8  Faces Pain Scale: Hurts  a little bit Pain Location: L LE, surgical site when stretched Pain Descriptors / Indicators: Discomfort;Operative site guarding Pain Intervention(s): Limited activity within patient's tolerance;Monitored during session;Repositioned    Home Living Family/patient expects to be discharged to:: Private residence Living  Arrangements: Spouse/significant other Available Help at Discharge: Family;Available 24 hours/day Type of Home: House Home Access: Stairs to enter   [employer] of Steps: 1 Home Layout: Two level;Able to live on main level with bedroom/bathroom Home Equipment: Gilford Rile - 2 wheels;Cane - single point;Shower seat      Prior Function Level of Independence: Independent               Hand Dominance   Dominant Hand: Right    Extremity/Trunk Assessment   Upper Extremity Assessment Upper Extremity Assessment: Overall WFL for tasks assessed    Lower Extremity Assessment Lower Extremity Assessment: Overall WFL for tasks assessed(s/p LLE surgery, slight limitation due to pain, but generally functional)    Cervical / Trunk Assessment Cervical / Trunk Assessment: Normal  Communication   Communication: No difficulties  Cognition Arousal/Alertness: Awake/alert Behavior During Therapy: WFL for tasks assessed/performed Overall Cognitive Status: Within Functional Limits for tasks assessed                                        General Comments General comments (skin integrity, edema, etc.): on 2L O2 upon PT entry, pt reports not using O2 at home, SpO2 95% at rest on RA initially, pt at 95-98% on RA during ambulation, after PT session pt left on RA but after 20 min SpO2 was noted 81-88% so pt was placed back on 1L O2.    Exercises     Assessment/Plan    PT Assessment Patient needs continued PT services  PT Problem List Decreased strength;Decreased mobility;Decreased range of motion;Decreased activity tolerance;Decreased skin integrity;Decreased balance;Pain       PT Treatment Interventions Gait training;DME instruction;Therapeutic exercise;Balance training;Stair training;Functional mobility training;Therapeutic activities;Patient/family education    PT Goals (Current goals can be found in the Care Plan section)  Acute Rehab PT Goals Patient Stated  Goal: to get home  PT Goal Formulation: With patient Time For Goal Achievement: 04/24/19 Potential to Achieve Goals: Good    Frequency Min 3X/week   Barriers to discharge        Co-evaluation               AM-PAC PT "6 Clicks" Mobility  Outcome Measure Help needed turning from your back to your side while in a flat bed without using bedrails?: None Help needed moving from lying on your back to sitting on the side of a flat bed without using bedrails?: None Help needed moving to and from a bed to a chair (including a wheelchair)?: A Little Help needed standing up from a chair using your arms (e.g., wheelchair or bedside chair)?: A Little Help needed to walk in hospital room?: A Little Help needed climbing 3-5 steps with a railing? : A Little 6 Click Score: 20    End of Session Equipment Utilized During Treatment: Gait belt;Oxygen(1L) Activity Tolerance: Patient tolerated treatment well Patient left: in bed;with call bell/phone within reach(in chair position) Nurse Communication: Mobility status;Other (comment)(O2 needs) PT Visit Diagnosis: Difficulty in walking, not elsewhere classified (R26.2);Pain Pain - Right/Left: Left Pain - part of body: Leg    Time: [id number] PT Time Calculation (min) (ACUTE ONLY): 24 min  Charges:   PT Evaluation $PT Eval Low Complexity: 1 Low PT Treatments $Gait Training: 8-22 mins        Karma Ganja, PT, DPT   Acute Rehabilitation Department [phone]  Otho Bellows 04/10/2019, 11:44 AM

## 2019-04-10 NOTE — Progress Notes (Addendum)
[facility]  Subjective  - Did not use the PCA over night and is willing to D/C PCA later today after getting up.   Objective 124/65 70 97.6 F (36.4 C) (Oral) 12 91%  Intake/Output Summary (Last 24 hours) at 04/10/2019 0703 Last data filed at 04/10/2019 0500 Gross per 24 hour  Intake 2483.31 ml  Output 2140 ml  Net 343.31 ml    Palpable DP left LE Incisions healing well without hematoma Active range of motion and sensation intact Lungs non labored breathing  GEN NAD  Assessment/Planning: POD # 1 pop-pop bypass with GSV  Patent left LE arterial flow Foley discharged this am pending independent void PT/OT evaluation today Possible discharge tomorrow. Will D/C PCA later today as he tolerates it.  Encouraged PO pain medication.    Roxy Horseman 04/10/2019 7:03 AM --  Laboratory Lab Results: Recent Labs    04/09/19 1215 04/10/19 0341  WBC 8.2 11.2*  HGB 12.1* 11.2*  HCT 38.9* 36.2*  PLT 251 269   BMET Recent Labs    04/09/19 1215 04/10/19 0341  NA  --  136  K  --  4.5  CL  --  99  CO2  --  27  GLUCOSE  --  130*  BUN  --  16  CREATININE 1.00 0.94  CALCIUM  --  8.8*    COAG Lab Results  Component Value Date   INR 1.1 04/09/2019   INR 1.03 03/23/2018   INR 1.18 03/17/2018   No results found for: PTT  I have seen and evaluated the patient. I agree with the PA note as documented above. POD#1 s/p left ak pop to bk pop bypass for exclusion of popliteal artery aneurysm.   Incisions look great.  Excellent palpable pedal pulses.  Plan to d/c PCA.  OOB to chair.  Ambulate.  PT etc.  Saline lock fluids.  Overall doing very well.  Marty Heck, MD [facility] [place] Office: [phone]

## 2019-04-10 NOTE — Evaluation (Signed)
Occupational Therapy Evaluation Patient Details Name: Julian Miranda MRN: [id number] DOB: 08/28/1950 Today's Date: 04/10/2019    History of Present Illness 69 yo male s/p above knee popliteal to below knee popliteal and ligation of L popiteal aneurysm L upper leg harvest greater saphenous vein and revise elisa lateral saphenous vein L LE. on 1/4/ 21 PMH 03/2018 R sital SFA below knee pop bypass with reversed saphenous vein for > 4 cm r popliteal aneurysm. MI 06/20 with CABG at [facility] / 19 CAD GERD HTN CVA PVD R rotator cuff repair hernia repair   Clinical Impression   PTA patient reports independent. Admitted for above and limited by problem list below, including pain in L LE, impaired balance, decreased activity tolerance. He was educated on safety, ADL compensatory techniques and mobility importance.  Demonstrates ability to complete UB ADLs with setup assist, LB ADLs with min assist, transfers with min guard assist and mobility using RW or no AD with min guard assist.  Cueing for PLB during session when off supplemental O2 at 2L, SpO2 maintained with PLB (during mobility ranged from 88-96%).  Will follow acutely to optimize independence and return to PLOF with ADLs, mobility; anticipate he will progress well with no further OT needs after dc home.     Follow Up Recommendations  No OT follow up;Supervision - Intermittent    Equipment Recommendations  None recommended by OT    Recommendations for Other Services       Precautions / Restrictions Restrictions Weight Bearing Restrictions: No      Mobility Bed Mobility Overal bed mobility: Needs Assistance Bed Mobility: Supine to Sit;Sit to Supine     Supine to sit: Supervision Sit to supine: Supervision   General bed mobility comments: increased time, + rail, no physical assist required  Transfers Overall transfer level: Needs assistance Equipment used: Rolling walker (2 wheeled) Transfers: Sit to/from Stand Sit to Stand: Min  guard         General transfer comment: min guard for safety/balance    Balance Overall balance assessment: Needs assistance Sitting-balance support: No upper extremity supported;Feet supported Sitting balance-Leahy Scale: Good     Standing balance support: No upper extremity supported;During functional activity Standing balance-Leahy Scale: Fair Standing balance comment: preference to UE support but min guard without UE support ambulation                           ADL either performed or assessed with clinical judgement   ADL Overall ADL's : Needs assistance/impaired     Grooming: Min guard;Standing   Upper Body Bathing: Supervision/ safety;Sitting   Lower Body Bathing: Minimal assistance;Sit to/from stand   Upper Body Dressing : Supervision/safety;Sitting   Lower Body Dressing: Minimal assistance;Sit to/from stand   Toilet Transfer: Min guard;Ambulation;RW           Functional mobility during ADLs: Min guard;Rolling walker General ADL Comments: limited by L LE pain and decreased functional reach     Vision Baseline Vision/History: Wears glasses Wears Glasses: At all times Vision Assessment?: No apparent visual deficits     Perception     Praxis      Pertinent Vitals/Pain Pain Assessment: Faces Faces Pain Scale: Hurts a little bit Pain Location: L LE  Pain Descriptors / Indicators: Discomfort;Operative site guarding Pain Intervention(s): Monitored during session;Repositioned     Hand Dominance Right   Extremity/Trunk Assessment Upper Extremity Assessment Upper Extremity Assessment: Overall WFL for tasks assessed  Lower Extremity Assessment Lower Extremity Assessment: Defer to PT evaluation(s/p L LE surgery)   Cervical / Trunk Assessment Cervical / Trunk Assessment: Normal   Communication Communication Communication: No difficulties   Cognition Arousal/Alertness: Awake/alert Behavior During Therapy: WFL for tasks  assessed/performed Overall Cognitive Status: Within Functional Limits for tasks assessed                                     General Comments  on 2L supplemental O2 upon entry, removed and O2 b/w 86-96%; improved with cueing for PLB during activity; RN notified and replaced O2 upon exit     Exercises     Shoulder Instructions      Home Living Family/patient expects to be discharged to:: Private residence Living Arrangements: Spouse/significant other Available Help at Discharge: Family;Available 24 hours/day Type of Home: House Home Access: Stairs to enter [employer] of Steps: 1   Home Layout: Two level;Able to live on main level with bedroom/bathroom Alternate Level Stairs-Number of Steps: flight   Bathroom Shower/Tub: Occupational psychologist: Standard     Home Equipment: Environmental consultant - 2 wheels;Cane - single point;Shower seat          Prior Functioning/Environment Level of Independence: Independent                 OT Problem List: Decreased activity tolerance;Decreased range of motion;Decreased knowledge of use of DME or AE;Decreased knowledge of precautions;Pain      OT Treatment/Interventions: Self-care/ADL training;DME and/or AE instruction;Therapeutic activities;Balance training;Patient/family education    OT Goals(Current goals can be found in the care plan section) Acute Rehab OT Goals Patient Stated Goal: to get home  OT Goal Formulation: With patient Time For Goal Achievement: 04/24/19 Potential to Achieve Goals: Good  OT Frequency: Min 2X/week   Barriers to D/C:            Co-evaluation              AM-PAC OT "6 Clicks" Daily Activity     Outcome Measure Help from another person eating meals?: None Help from another person taking care of personal grooming?: A Little Help from another person toileting, which includes using toliet, bedpan, or urinal?: A Little Help from another person bathing (including  washing, rinsing, drying)?: A Little Help from another person to put on and taking off regular upper body clothing?: None Help from another person to put on and taking off regular lower body clothing?: A Little 6 Click Score: 20   End of Session Equipment Utilized During Treatment: Gait belt;Rolling walker Nurse Communication: Mobility status  Activity Tolerance: Patient tolerated treatment well Patient left: in bed;with call bell/phone within reach  OT Visit Diagnosis: Other abnormalities of gait and mobility (R26.89);Pain Pain - Right/Left: Left Pain - part of body: Knee;Leg                Time: [id number] OT Time Calculation (min): 24 min Charges:  OT General Charges $OT Visit: 1 Visit OT Evaluation $OT Eval Moderate Complexity: 1 Mod OT Treatments $Self Care/Home Management : 8-22 mins  Jolaine Artist, OT Acute Rehabilitation Services Pager [phone] Office [phone]   Delight Stare 04/10/2019, 10:09 AM

## 2019-04-11 ENCOUNTER — Encounter (HOSPITAL_COMMUNITY): Payer: Medicare Other

## 2019-04-11 MED ORDER — OXYCODONE HCL 5 MG PO TABS: 5.0000 mg | ORAL_TABLET | Freq: Four times a day (QID) | ORAL | 0 refills | Status: DC | PRN

## 2019-04-11 NOTE — Progress Notes (Addendum)
[facility] [place]  Subjective  - Doing well without no new complaints.   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  Tolerating PO medication for pain control.  PT recommended HH. F/U in 2-3 weeks with Dr. Justine Null 04/11/2019 7:39 AM --  Laboratory Lab Results: Recent Labs    04/09/19 1215 04/10/19 0341  WBC 8.2 11.2*  HGB 12.1* 11.2*  HCT 38.9* 36.2*  PLT 251 269   BMET Recent Labs    04/09/19 1215 04/10/19 0341  NA  --  136  K  --  4.5  CL  --  99  CO2  --  27  GLUCOSE  --  130*  BUN  --  16  CREATININE 1.00 0.94  CALCIUM  --  8.8*    COAG Lab Results  Component Value Date   INR 1.1 04/09/2019   INR 1.03 03/23/2018   INR 1.18 03/17/2018   No results found for: PTT   I have seen and evaluated the patient. I agree with the PA note as documented above. POD#2 s/p left AK pop to BK pop bypass with exclusion left popliteal artery aneurysm.  Continues to do well.  Incisions look great.  Palpable pedal pulses.  Plan for d/c home today.  Home PT ordered.  F/U 3 weeks for wound check.  Pain well controlled.  Marty Heck, MD [facility] [place] Office: [phone]

## 2019-04-11 NOTE — Discharge Instructions (Signed)
 [facility] [place]  Discharge instructions  Lower Extremity Bypass Surgery  Please refer to the following instruction for your post-procedure care. Your surgeon or physician assistant will discuss any changes with you.  Activity  You are encouraged to walk as much as you can. You can slowly return to normal activities during the month after your surgery. Avoid strenuous activity and heavy lifting until your doctor tells you it's OK. Avoid activities such as vacuuming or swinging a golf club. Do not drive until your doctor give the OK and you are no longer taking prescription pain medications. It is also normal to have difficulty with sleep habits, eating and bowel movement after surgery. These will go away with time.  Bathing/Showering  You may shower after you go home. Do not soak in a bathtub, hot tub, or swim until the incision heals completely.  Incision Care  Clean your incision with mild soap and water. Shower every day. Pat the area dry with a clean towel. You do not need a bandage unless otherwise instructed. Do not apply any ointments or creams to your incision. If you have open wounds you will be instructed how to care for them or a visiting nurse may be arranged for you. If you have staples or sutures along your incision they will be removed at your post-op appointment. You may have skin glue on your incision. Do not peel it off. It will come off on its own in about one week. If you have a great deal of moisture in your groin, use a gauze help keep this area dry.  Diet  Resume your normal diet. There are no special food restrictions following this procedure. A low fat/ low cholesterol diet is recommended for all patients with vascular disease. In order to heal from your surgery, it is CRITICAL to get adequate nutrition. Your body requires vitamins, minerals, and protein. Vegetables are the best source of vitamins and minerals. Vegetables also provide the  perfect balance of protein. Processed food has little nutritional value, so try to avoid this.  Medications  Resume taking all your medications unless your doctor or nurse practitioner tells you not to. If your incision is causing pain, you may take over-the-counter pain relievers such as acetaminophen (Tylenol). If you were prescribed a stronger pain medication, please aware these medication can cause nausea and constipation. Prevent nausea by taking the medication with a snack or meal. Avoid constipation by drinking plenty of fluids and eating foods with high amount of fiber, such as fruits, vegetables, and grains. Take Colase 100 mg (an over-the-counter stool softener) twice a day as needed for constipation. Do not take Tylenol if you are taking prescription pain medications.  Follow Up  Our office will schedule a follow up appointment 2-3 weeks following discharge.  Please call us immediately for any of the following conditions  Severe or worsening pain in your legs or feet while at rest or while walking Increase pain, redness, warmth, or drainage (pus) from your incision site(s) Fever of 101 degree or higher The swelling in your leg with the bypass suddenly worsens and becomes more painful than when you were in the hospital If you have been instructed to feel your graft pulse then you should do so every day. If you can no longer feel this pulse, call the office immediately. Not all patients are given this instruction.  Leg swelling is common after leg bypass surgery.  The swelling should improve over a few months   following surgery. To improve the swelling, you may elevate your legs above the level of your heart while you are sitting or resting. Your surgeon or physician assistant may ask you to apply an ACE wrap or wear compression (TED) stockings to help to reduce swelling.  Reduce your risk of vascular disease  Stop smoking. If you would like help call [facility] at 1-800-QUIT-NOW  (1-800-784-8669) or [place] at 336-586-4000.  Manage your cholesterol Maintain a desired weight Control your diabetes weight Control your diabetes Keep your blood pressure down  If you have any questions, please call the office at [phone]   

## 2019-04-11 NOTE — TOC Transition Note (Signed)
Transition of Care [facility]) - CM/SW Discharge Note Marvetta Gibbons RN, BSN Transitions of Care Unit 4E- RN Case Manager [phone]   Patient Details  Name: Julian Miranda MRN: [id number] Date of Birth: [birth year]  Transition of Care [facility]) CM/SW Contact:  Dawayne Patricia, RN Phone Number: 04/11/2019, 10:11 AM   Clinical Narrative:    Pt stable for transition home today, order placed for HHPT, per bedside RN pt reports that he has used [facility] in past and would like to use them again, pt wife to transport home, has DME at home. Call made to [facility] [phone]) spoke with Sharyn Lull regarding HHPT needs- [facility] has accepted referral- faxed needed paperwork to [phone] via epic  [facility] office as fax is not working in [place] office)-    Final next level of care: [place] Barriers to Discharge: No Barriers Identified   Patient Goals and CMS Choice Patient states their goals for this hospitalization and ongoing recovery are:: return home CMS Medicare.gov Compare Post Acute Care list provided to:: Patient Choice offered to / list presented to : Patient  Discharge Placement                 Home with [facility]      Discharge Plan and Services   Discharge Planning Services: CM Consult Post Acute Care Choice: Home Health          DME Arranged: N/A DME Agency: NA       HH Arranged: PT [place] Agency: [place] ([place], [place]) Date HH Agency Contacted: 04/11/19 Time [place]: 1011 Representative spoke with at [place]: [place] ([place]) Interventions     Readmission Risk Interventions No flowsheet data found.

## 2019-04-11 NOTE — Progress Notes (Signed)
Occupational Therapy Treatment Patient Details Name: Julian Miranda MRN: [id number] DOB: [birth year] Today's Date: 04/11/2019    History of present illness 69 yo male s/p above knee popliteal to below knee popliteal and ligation of L popiteal aneurysm L upper leg harvest greater saphenous vein and revise elisa lateral saphenous vein L LE. on 1/4/ 21 PMH 03/2018 R sital SFA below knee pop bypass with reversed saphenous vein for > 4 cm r popliteal aneurysm. 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 Patient supervision level for transfers, functional ambulation using rolling walker, sink side grooming/hygiene and toileting. No loss of balance noted, VSS. Min cues for safety with sitting due to limited eccentric control.    Follow Up Recommendations  No OT follow up;Supervision - Intermittent    Equipment Recommendations  None recommended by OT       Precautions / Restrictions Precautions Precautions: None Restrictions Weight Bearing Restrictions: No       Mobility Bed Mobility Overal bed mobility: Modified Independent                Transfers Overall transfer level: Needs assistance Equipment used: Rolling walker (2 wheeled) Transfers: Sit to/from Stand Sit to Stand: Supervision         General transfer comment: verbal cues for safety with sitting, limited eccentric control    Balance Overall balance assessment: Needs assistance Sitting-balance support: No upper extremity supported;Feet supported Sitting balance-Leahy Scale: Good     Standing balance support: Bilateral upper extremity supported;During functional activity Standing balance-Leahy Scale: Fair Standing balance comment: external support from RW                           ADL either performed or assessed with clinical judgement   ADL Overall ADL's : Needs assistance/impaired      Grooming: Oral care;Wash/dry hands;Wash/dry face;Supervision/safety;Standing                   Toilet Transfer: IT consultant Details (indicate cue type and reason): pt stood at toilet to urinate, no loss of balance noted Toileting- Water quality scientist and Hygiene: Supervision/safety;Sit to/from stand       Functional mobility during ADLs: Supervision/safety;Rolling walker                 Cognition Arousal/Alertness: Awake/alert Behavior During Therapy: WFL for tasks assessed/performed Overall Cognitive Status: Within Functional Limits for tasks assessed                                                General Comments O2 97% on room air    Pertinent Vitals/ Pain       Pain Assessment: No/denies pain         Frequency  Min 2X/week        Progress Toward Goals  OT Goals(current goals can now be found in the care plan section)  Progress towards OT goals: Progressing toward goals  Acute Rehab OT Goals Patient Stated Goal: to get home  OT Goal Formulation: With patient Time For Goal Achievement: 04/24/19 Potential to Achieve Goals: Good ADL Goals Pt Will Perform Grooming: with modified independence;standing Pt Will Perform Lower Body Dressing: with modified independence;sit  to/from stand;with adaptive equipment Pt Will Transfer to Toilet: with modified independence;ambulating;bedside commode Pt Will Perform Toileting - Clothing Manipulation and hygiene: with modified independence;sit to/from stand Pt Will Perform Tub/Shower Transfer: Shower transfer;ambulating;rolling walker;shower seat;with modified independence  Plan Discharge plan remains appropriate       AM-PAC OT "6 Clicks" Daily Activity     Outcome Measure   Help from another person eating meals?: None Help from another person taking care of personal grooming?: A Little Help from another person toileting, which includes using  toliet, bedpan, or urinal?: A Little Help from another person bathing (including washing, rinsing, drying)?: A Little Help from another person to put on and taking off regular upper body clothing?: None Help from another person to put on and taking off regular lower body clothing?: A Little 6 Click Score: 20    End of Session Equipment Utilized During Treatment: Rolling walker  OT Visit Diagnosis: Other abnormalities of gait and mobility (R26.89);Pain Pain - Right/Left: Left Pain - part of body: Knee;Leg   Activity Tolerance Patient tolerated treatment well   Patient Left in bed;with call bell/phone within reach   Nurse Communication Mobility status        Time: [id number] OT Time Calculation (min): 18 min  Charges: OT General Charges $OT Visit: 1 Visit OT Treatments $Self Care/Home Management : 8-22 mins  Shon Millet OT OT office: [place] 04/11/2019, 11:53 AM

## 2019-04-11 NOTE — Discharge Summary (Signed)
[facility] Discharge Summary   Patient ID:  Julian Miranda MRN: [id number] DOB/AGE: 05/24/1950 69 y.o.  Admit date: 04/09/2019 Discharge date: 04/11/2019 Date of Surgery: 04/09/2019 Surgeon: Surgeon(s): Marty Heck, MD  Admission Diagnosis: Aneurysm artery, popliteal [facility]) [I72.4]  Discharge Diagnoses:  Aneurysm artery, popliteal ([place]) [I72.4]  Secondary Diagnoses: Past Medical History:  Diagnosis Date  . CAD (coronary artery disease)   . GERD (gastroesophageal reflux disease)   . Hypertension   . Myocardial infarction ([place]) 1994  . Peripheral vascular disease ([place])   . Stroke ([place])     Procedure(s): BYPASS  ABOVE KNEE POPLITEAL TO BELOW KNEE POPLITEAL AND LIGATION OF LEFT POPITEAL ANEURYSM Harvest Greater Saphenous Vein and  Revise Elisa Lateral Saphenous Vein   Discharged Condition: stable  HPI: Julian Redmanis a 69 y.o.malethat presents for ongoing follow-up after right distal SFA to below-knee pop bypass with reversed saphenous vein on 03/23/2018 for exclcusion of > 4 cm right popliteal aneurysm.Also been following a left popliteal aneurysm.  On follow up duplex the Left popliteal aneurysm is increased from 1.88to2.12cm.  He was scheduled for exclusion bypass surgery.     Hospital Course:  Julian Miranda is a 69 y.o. male is S/P Procedure(s): BYPASS  ABOVE KNEE POPLITEAL TO BELOW KNEE POPLITEAL AND LIGATION OF LEFT POPITEAL ANEURYSM Harvest Greater Saphenous Vein and  Revise Elisa Lateral Saphenous Vein  Post pain issues that resolved by post op day 2.  PT HH was set up and patient was discharged home in stable condition with palpable DP pulse on the left LE.      Significant Diagnostic Studies: CBC Lab Results  Component Value Date   WBC 11.2 (H) 04/10/2019   HGB 11.2 (L) 04/10/2019   HCT 36.2 (L) 04/10/2019   MCV 96.0 04/10/2019   PLT 269 04/10/2019    BMET    Component Value Date/Time   NA 136 04/10/2019 0341   K 4.5  04/10/2019 0341   CL 99 04/10/2019 0341   CO2 27 04/10/2019 0341   GLUCOSE 130 (H) 04/10/2019 0341   BUN 16 04/10/2019 0341   CREATININE 0.94 04/10/2019 0341   CALCIUM 8.8 (L) 04/10/2019 0341   GFRNONAA >60 04/10/2019 0341   GFRAA >60 04/10/2019 0341   COAG Lab Results  Component Value Date   INR 1.1 04/09/2019   INR 1.03 03/23/2018   INR 1.18 03/17/2018     Disposition:  Discharge to :Home Discharge Instructions    Call MD for:  redness, tenderness, or signs of infection (pain, swelling, bleeding, redness, odor or green/yellow discharge around incision site)   Complete by: As directed    Call MD for:  severe or increased pain, loss or decreased feeling  in affected limb(s)   Complete by: As directed    Call MD for:  temperature >100.5   Complete by: As directed    Resume previous diet   Complete by: As directed      Allergies as of 04/11/2019      Reactions   Other    Vancomycin    Other reaction(s): Red Man Syndrome Noted intraoperatively on 10/04/2018. No associated hemodynamic or respiratory changes. Please give slowly.      Medication List    TAKE these medications   acetaminophen 500 MG tablet Commonly known as: TYLENOL Take 500 mg by mouth 2 (two) times daily. Notes to patient: Take this evening   ALPRAZolam 0.25 MG tablet Commonly known as: XANAX Take 0.25 mg by mouth 2 (  two) times daily. Notes to patient: Take this evening   amiodarone 200 MG tablet Commonly known as: PACERONE Take 200 mg by mouth daily. Notes to patient: Take tomorrow   aspirin 81 MG tablet Take 81 mg by mouth daily. Notes to patient: Take tomorrow   atorvastatin 80 MG tablet Commonly known as: LIPITOR Take 80 mg by mouth at bedtime. Notes to patient: Take this evening   CALCIUM/MAGNESIUM/ZINC PO Take 1 tablet by mouth at bedtime.   carvedilol 25 MG tablet Commonly known as: COREG Take 25 mg by mouth 2 (two) times daily with a meal. Notes to patient: Take this evening    furosemide 20 MG tablet Commonly known as: LASIX Take 20 mg by mouth 2 (two) times daily. Notes to patient: Take this evening   lisinopril 20 MG tablet Commonly known as: ZESTRIL Take 20 mg by mouth daily. Notes to patient: Take tomorrow   meclizine 25 MG tablet Commonly known as: ANTIVERT Take 25 mg by mouth 2 (two) times daily as needed for dizziness.   Melatonin 3 MG Tabs Take 3 mg by mouth daily.   omeprazole 20 MG capsule Commonly known as: PRILOSEC Take 20 mg by mouth 2 (two) times daily.   oxyCODONE 5 MG immediate release tablet Commonly known as: Oxy IR/ROXICODONE Take 1 tablet (5 mg total) by mouth every 6 (six) hours as needed for moderate pain.   potassium chloride 10 MEQ tablet Commonly known as: KLOR-CON Take 10 mEq by mouth 2 (two) times daily. Notes to patient: Take tonight   zinc gluconate 50 MG tablet Take 50 mg by mouth daily.      Verbal and written Discharge instructions given to the patient. Wound care per Discharge AVS Follow-up Information    Marty Heck, MD Follow up in 2 week(s).   Specialty: Vascular Surgery Why: office will call Contact information: [place] [address] [place] [facility] [place] Follow up.   Why: HHHPT referral made - they will call you to set up home visits Contact information: [phone]          Signed: Roxy Horseman 04/11/2019, 3:31 PM - For VQI Registry use --- Instructions: Press F2 to tab through selections.  Delete question if not applicable.   Post-op:  Wound infection: No  Graft infection: No  Transfusion: No  If yes, 0 units given New Arrhythmia: No Ipsilateral amputation: [x ] no, [ ]  Minor, [ ]  BKA, [ ]  AKA Discharge patency: [x ] Primary, [ ]  Primary assisted, [ ]  Secondary, [ ]  Occluded Patency judged by: [ ]  Dopper only, [ ]  Palpable graft pulse, [x ] Palpable distal pulse, [ ]  ABI inc. > 0.15, [ ]  Duplex D/C Ambulatory Status:  Ambulatory with Assistance  Complications: MI: [x ] No, [ ]  Troponin only, [ ]  EKG or Clinical CHF: No Resp failure: [x ] none, [ ]  Pneumonia, [ ]  Ventilator Chg in renal function: [x ] none, [ ]  Inc. Cr > 0.5, [ ]  Temp. Dialysis, [ ]  Permanent dialysis Stroke: [x ] None, [ ]  Minor, [ ]  Major Return to OR: No  Reason for return to OR: [ ]  Bleeding, [ ]  Infection, [ ]  Thrombosis, [ ]  Revision  Discharge medications: Statin use:  Yes ASA use:  Yes Plavix use:  No  for medical reason   Beta blocker use: Yes Coumadin use: No  for medical reason

## 2019-05-01 ENCOUNTER — Other Ambulatory Visit: Payer: Self-pay

## 2019-05-01 ENCOUNTER — Encounter: Payer: Self-pay | Admitting: Vascular Surgery

## 2019-05-01 ENCOUNTER — Ambulatory Visit (INDEPENDENT_AMBULATORY_CARE_PROVIDER_SITE_OTHER): Payer: Self-pay | Admitting: Vascular Surgery

## 2019-05-01 VITALS — BP 162/90 | HR 67 | Temp 97.9°F | Resp 18 | Ht 70.0 in | Wt 240.0 lb

## 2019-05-01 DIAGNOSIS — I724 Aneurysm of artery of lower extremity: Secondary | ICD-10-CM

## 2019-05-01 NOTE — Progress Notes (Signed)
Patient name: Julian Miranda MRN: [id number] DOB: Jul 15, 1950 Sex: male  REASON FOR VISIT: Postop check after left above-knee to below-knee popliteal bypass for exclusion of popliteal aneurysm  HPI: Julian Miranda is a 69 y.o. 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  Surgery was recently performed on 04/09/2019.  He had no issues postop and was discharged home.  States he is doing very well at home and is recovering without issue.  He is walking up and down stairs with a cane.  He is not taking any narcotics.  States his foot feels great.  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      Past Medical History:  Diagnosis Date  . CAD (coronary artery disease)   . GERD (gastroesophageal reflux disease)   . Hypertension   . Myocardial infarction ([place]) 1994  . Peripheral vascular disease ([place])   . Stroke [facility])     Past Surgical History:  Procedure Laterality Date  . BYPASS GRAFT POPLITEAL TO POPLITEAL Right 03/23/2018   Procedure: BYPASS GRAFT RIGHT ABOVE KNEE POPLITEAL TO BELOW KNEE POPLITEAL ARTERY  USING RIGHT GREAT SAPHENOUS VEIN;  Surgeon: Marty Heck, MD;  Location: [place];  Service: Vascular;  Laterality: Right;  . BYPASS GRAFT POPLITEAL TO POPLITEAL Left 04/09/2019   Procedure: BYPASS  ABOVE KNEE POPLITEAL TO BELOW KNEE POPLITEAL AND LIGATION OF LEFT POPITEAL ANEURYSM;  Surgeon: Marty Heck, MD;  Location: [place];  Service: Vascular;  Laterality: Left;  . COLONOSCOPY WITH ESOPHAGOGASTRODUODENOSCOPY (EGD)    . CORONARY ARTERY BYPASS GRAFT    . EYE SURGERY Bilateral    cataracts  . FEMORAL BYPASS Right 03/23/2018  . HERNIA REPAIR    . MASTOIDECTOMY    . ROTATOR CUFF REPAIR Right   . TONSILLECTOMY    . VEIN HARVEST Right 03/23/2018     Procedure: VEIN HARVEST RIGHT GREAT SAPHENOUS;  Surgeon: Marty Heck, MD;  Location: [place];  Service: Vascular;  Laterality: Right;  . VEIN HARVEST Left 04/09/2019   Procedure: Harvest Greater Saphenous Vein and  Revise Elisa Lateral Saphenous Vein ;  Surgeon: Marty Heck, MD;  Location: [facility] OR;  Service: Vascular;  Laterality: Left;    Family History  Problem Relation Age of Onset  . Heart disease Mother     SOCIAL HISTORY: Social History   Tobacco Use  . Smoking status: Never Smoker  . Smokeless tobacco: Never Used  Substance Use Topics  . Alcohol use: Never    Allergies  Allergen Reactions  . Other   . Vancomycin     Other reaction(s): Red Man Syndrome Noted intraoperatively on 10/04/2018. No associated hemodynamic or respiratory changes. Please give slowly.    Current Outpatient Medications  Medication Sig Dispense Refill  . acetaminophen (TYLENOL) 500 MG tablet Take 500 mg by mouth 2 (two) times daily.     Marland Kitchen ALPRAZolam (XANAX) 0.25 MG tablet Take 0.25 mg by mouth 2 (two) times daily.    Marland Kitchen amiodarone (PACERONE) 200 MG tablet Take 200 mg by mouth daily.     Marland Kitchen aspirin 81 MG tablet Take 81 mg by mouth daily.    Marland Kitchen atorvastatin (LIPITOR) 80 MG tablet Take 80 mg by mouth at bedtime.     . carvedilol (COREG) 25 MG  tablet Take 25 mg by mouth 2 (two) times daily with a meal.     . furosemide (LASIX) 20 MG tablet Take 20 mg by mouth 2 (two) times daily.     Marland Kitchen lisinopril (ZESTRIL) 20 MG tablet Take 20 mg by mouth daily.    . meclizine (ANTIVERT) 25 MG tablet Take 25 mg by mouth 2 (two) times daily as needed for dizziness.     . Melatonin 3 MG TABS Take 3 mg by mouth daily.    . Multiple Minerals (CALCIUM/MAGNESIUM/ZINC PO) Take 1 tablet by mouth at bedtime.    Marland Kitchen omeprazole (PRILOSEC) 20 MG capsule Take 20 mg by mouth 2 (two) times daily.     . potassium chloride (KLOR-CON) 10 MEQ tablet Take 10 mEq by mouth 2 (two) times daily.     Marland Kitchen zinc gluconate 50 MG tablet  Take 50 mg by mouth daily.    Marland Kitchen oxyCODONE (OXY IR/ROXICODONE) 5 MG immediate release tablet Take 1 tablet (5 mg total) by mouth every 6 (six) hours as needed for moderate pain. (Patient not taking: Reported on 05/01/2019) 20 tablet 0   No current facility-administered medications for this visit.    REVIEW OF SYSTEMS:  [X]  denotes positive finding, [ ]  denotes negative finding Cardiac  Comments:  Chest pain or chest pressure:    Shortness of breath upon exertion:    Short of breath when lying flat:    Irregular heart rhythm:        Vascular    Pain in calf, thigh, or hip brought on by ambulation:    Pain in feet at night that wakes you up from your sleep:     Blood clot in your veins:    Leg swelling:         Pulmonary    Oxygen at home:    Productive cough:     Wheezing:         Neurologic    Sudden weakness in arms or legs:     Sudden numbness in arms or legs:     Sudden onset of difficulty speaking or slurred speech:    Temporary loss of vision in one eye:     Problems with dizziness:         Gastrointestinal    Blood in stool:     Vomited blood:         Genitourinary    Burning when urinating:     Blood in urine:        Psychiatric    Major depression:         Hematologic    Bleeding problems:    Problems with blood clotting too easily:        Skin    Rashes or ulcers:        Constitutional    Fever or chills:      PHYSICAL EXAM: Vitals:   05/01/19 1520  BP: (!) 162/90  Pulse: 67  Resp: 18  Temp: 97.9 F (36.6 C)  TempSrc: Temporal  SpO2: 96%  Weight: 240 lb (108.9 kg)  Height: 5\' 10"  (1.778 m)    GENERAL: The patient is a well-nourished male, in no acute distress. The vital signs are documented above. CARDIAC: There is a regular rate and rhythm.  VASCULAR:  Left leg incisions healing without issue Right DP pulse 2+ palpable Left DP pulse 2+ palpable PULMONARY: There is good air exchange bilaterally without wheezing or rales. ABDOMEN: Soft  and non-tender with normal  pitched bowel sounds.  MUSCULOSKELETAL: There are no major deformities or cyanosis. NEUROLOGIC: No focal weakness or paresthesias are detected.  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  His incisions are healing without issue in the left leg.  He has a very easily palpable left dorsalis pedis pulse.  Left foot is motor and sensory intact.  Very pleased with his progress.  I will plan to see him back in 6 months with ABIs and bilateral lower extremity arterial duplexes.  This will be for surveillance of his most recent bypass as well as for previous right distal SFA to below-knee popliteal bypass with exclusion of a right popliteal aneurysm over 4 cm on 03/23/2018.  Marty Heck, MD [facility] [place] Office: [place]

## 2019-05-02 ENCOUNTER — Other Ambulatory Visit: Payer: Self-pay | Admitting: *Deleted

## 2019-05-02 DIAGNOSIS — I724 Aneurysm of artery of lower extremity: Secondary | ICD-10-CM

## 2019-09-07 ENCOUNTER — Telehealth: Payer: Self-pay

## 2019-09-07 NOTE — Telephone Encounter (Signed)
Spoke to pt to ask him MRI questionnaire.

## 2019-09-14 ENCOUNTER — Other Ambulatory Visit: Payer: Self-pay | Admitting: *Deleted

## 2019-09-14 DIAGNOSIS — I724 Aneurysm of artery of lower extremity: Secondary | ICD-10-CM

## 2019-09-14 DIAGNOSIS — M25569 Pain in unspecified knee: Secondary | ICD-10-CM

## 2019-09-17 ENCOUNTER — Encounter (HOSPITAL_COMMUNITY): Payer: Medicare Other

## 2019-09-18 ENCOUNTER — Other Ambulatory Visit: Payer: Self-pay

## 2019-09-18 ENCOUNTER — Encounter: Payer: Self-pay | Admitting: Vascular Surgery

## 2019-09-18 ENCOUNTER — Ambulatory Visit (INDEPENDENT_AMBULATORY_CARE_PROVIDER_SITE_OTHER)
Admission: RE | Admit: 2019-09-18 | Discharge: 2019-09-18 | Disposition: A | Discharge status: Home / Self Care | Payer: Medicare Other | Source: Ambulatory Visit | Attending: Vascular Surgery | Admitting: Vascular Surgery

## 2019-09-18 ENCOUNTER — Ambulatory Visit (INDEPENDENT_AMBULATORY_CARE_PROVIDER_SITE_OTHER): Payer: Medicare Other | Admitting: Vascular Surgery

## 2019-09-18 ENCOUNTER — Encounter (HOSPITAL_COMMUNITY): Payer: Medicare Other

## 2019-09-18 ENCOUNTER — Ambulatory Visit (HOSPITAL_COMMUNITY)
Admission: RE | Admit: 2019-09-18 | Discharge: 2019-09-18 | Disposition: A | Discharge status: Home / Self Care | Payer: Medicare Other | Source: Ambulatory Visit | Attending: Vascular Surgery | Admitting: Vascular Surgery

## 2019-09-18 VITALS — BP 129/80 | HR 91 | Temp 98.1°F | Resp 18 | Ht 70.0 in | Wt 222.0 lb

## 2019-09-18 DIAGNOSIS — I724 Aneurysm of artery of lower extremity: Secondary | ICD-10-CM | POA: Diagnosis not present

## 2019-09-18 NOTE — Progress Notes (Signed)
Patient name: Julian Miranda MRN: [id number] DOB: 1950-12-01 Sex: male  REASON FOR VISIT: Leg pain and swelling after bilateral popliteal artery bypasses for popliteal artery aneurysms  HPI: Julian Miranda is a 69 y.o. male that presents for evaluation of bilateral leg pain and swelling in the setting of remote bilateral popliteal artery bypasses for popliteal aneurysms.  Patient states he has been having pain in both knees for several weeks and particularly the right leg sometimes he has trouble with range of motion.  He has also noticed much more increased swelling at the end of the day in both legs.  He previously underwent a left lower extremity bypass for exclusion of popliteal artery that was 2.2 cm on 04/09/2019.  He also has had a right lower extremity bypass for exclusion of popliteal artery aneurysm on 03/23/2018 for greater than 4 cm popliteal artery aneurysm.     Past Medical History:  Diagnosis Date  . CAD (coronary artery disease)   . GERD (gastroesophageal reflux disease)   . Hypertension   . Myocardial infarction ([place]) 1994  . Peripheral vascular disease ([place])   . Stroke [facility])     Past Surgical History:  Procedure Laterality Date  . BYPASS GRAFT POPLITEAL TO POPLITEAL Right 03/23/2018   Procedure: BYPASS GRAFT RIGHT ABOVE KNEE POPLITEAL TO BELOW KNEE POPLITEAL ARTERY  USING RIGHT GREAT SAPHENOUS VEIN;  Surgeon: Marty Heck, MD;  Location: [place];  Service: Vascular;  Laterality: Right;  . BYPASS GRAFT POPLITEAL TO POPLITEAL Left 04/09/2019   Procedure: BYPASS  ABOVE KNEE POPLITEAL TO BELOW KNEE POPLITEAL AND LIGATION OF LEFT POPITEAL ANEURYSM;  Surgeon: Marty Heck, MD;  Location: [place];  Service: Vascular;  Laterality: Left;  . COLONOSCOPY WITH ESOPHAGOGASTRODUODENOSCOPY (EGD)    . CORONARY ARTERY BYPASS GRAFT    . EYE SURGERY Bilateral    cataracts  . FEMORAL BYPASS Right 03/23/2018  . HERNIA REPAIR    . MASTOIDECTOMY    . ROTATOR CUFF REPAIR Right   .  TONSILLECTOMY    . VEIN HARVEST Right 03/23/2018   Procedure: VEIN HARVEST RIGHT GREAT SAPHENOUS;  Surgeon: Marty Heck, MD;  Location: [place];  Service: Vascular;  Laterality: Right;  . VEIN HARVEST Left 04/09/2019   Procedure: Harvest Greater Saphenous Vein and  Revise Elisa Lateral Saphenous Vein ;  Surgeon: Marty Heck, MD;  Location: [facility] OR;  Service: Vascular;  Laterality: Left;    Family History  Problem Relation Age of Onset  . Heart disease Mother     SOCIAL HISTORY: Social History   Tobacco Use  . Smoking status: Never Smoker  . Smokeless tobacco: Never Used  Substance Use Topics  . Alcohol use: Never    Allergies  Allergen Reactions  . Other   . Vancomycin     Other reaction(s): Red Man Syndrome Noted intraoperatively on 10/04/2018. No associated hemodynamic or respiratory changes. Please give slowly.    Current Outpatient Medications  Medication Sig Dispense Refill  . acetaminophen (TYLENOL) 500 MG tablet Take 500 mg by mouth 2 (two) times daily.     Marland Kitchen ALPRAZolam (XANAX) 0.25 MG tablet Take 0.25 mg by mouth 2 (two) times daily.    Marland Kitchen amiodarone (PACERONE) 200 MG tablet Take 200 mg by mouth daily.     Marland Kitchen aspirin 81 MG tablet Take 81 mg by mouth daily.    Marland Kitchen atorvastatin (LIPITOR) 80 MG tablet Take 80 mg by mouth at bedtime.     . carvedilol (COREG)  25 MG tablet Take 25 mg by mouth 2 (two) times daily with a meal.     . furosemide (LASIX) 20 MG tablet Take 20 mg by mouth 2 (two) times daily.     Marland Kitchen lisinopril (ZESTRIL) 20 MG tablet Take 20 mg by mouth daily.    . meclizine (ANTIVERT) 25 MG tablet Take 25 mg by mouth 2 (two) times daily as needed for dizziness.     . Melatonin 3 MG TABS Take 3 mg by mouth daily.    . Multiple Minerals (CALCIUM/MAGNESIUM/ZINC PO) Take 1 tablet by mouth at bedtime.    Marland Kitchen omeprazole (PRILOSEC) 20 MG capsule Take 20 mg by mouth 2 (two) times daily.     . potassium chloride (KLOR-CON) 10 MEQ tablet Take 10 mEq by mouth 2 (two)  times daily.     Marland Kitchen zinc gluconate 50 MG tablet Take 50 mg by mouth daily.    Marland Kitchen oxyCODONE (OXY IR/ROXICODONE) 5 MG immediate release tablet Take 1 tablet (5 mg total) by mouth every 6 (six) hours as needed for moderate pain. (Patient not taking: Reported on 09/18/2019) 20 tablet 0   No current facility-administered medications for this visit.    REVIEW OF SYSTEMS:  [X]  denotes positive finding, [ ]  denotes negative finding Cardiac  Comments:  Chest pain or chest pressure:    Shortness of breath upon exertion:    Short of breath when lying flat:    Irregular heart rhythm:        Vascular    Pain in calf, thigh, or hip brought on by ambulation:    Pain in feet at night that wakes you up from your sleep:     Blood clot in your veins:    Leg swelling:         Pulmonary    Oxygen at home:    Productive cough:     Wheezing:         Neurologic    Sudden weakness in arms or legs:     Sudden numbness in arms or legs:     Sudden onset of difficulty speaking or slurred speech:    Temporary loss of vision in one eye:     Problems with dizziness:         Gastrointestinal    Blood in stool:     Vomited blood:         Genitourinary    Burning when urinating:     Blood in urine:        Psychiatric    Major depression:         Hematologic    Bleeding problems:    Problems with blood clotting too easily:        Skin    Rashes or ulcers:        Constitutional    Fever or chills:      PHYSICAL EXAM: Vitals:   09/18/19 1256  BP: 129/80  Pulse: 91  Resp: 18  Temp: 98.1 F (36.7 C)  TempSrc: Temporal  SpO2: 97%  Weight: 222 lb (100.7 kg)  Height: 5\' 10"  (1.778 m)    GENERAL: The patient is a well-nourished male, in no acute distress. The vital signs are documented above. CARDIAC: There is a regular rate and rhythm.  VASCULAR:  Bilateral leg incisions well healed Right DP pulse 2+ palpable Left DP pulse 2+ palpable PULMONARY: There is good air exchange bilaterally  without wheezing or rales. ABDOMEN: Soft and non-tender with normal  pitched bowel sounds.  MUSCULOSKELETAL: There are no major deformities or cyanosis. NEUROLOGIC: No focal weakness or paresthesias are detected.  DATA:   I indepdently reviewed his bilateral lower extremity arterial duplexes and bilateral lower extremity bypasses are widely patent with no stenosis.  The right popliteal artery aneurysm measures 2.9 x 3.1 cm and the left popliteal artery aneurysm measures 1.39 x 1.3 cm.  Assessment/Plan:  69 year old male presents for evaluation of bilateral lower extremity pain and swelling over the last several weeks in the setting of previous bilateral lower extremity bypasses for exclusion of popliteal artery aneurysms.  Discussed with him that I reviewed his arterial duplex and both of his bypasses are widely patent with no stenosis.  He has palpable dorsalis pedis and posterior tibial pulses on exam in both lower extremities.  In addition I think his popliteal aneurysms are getting smaller given the right popliteal aneurysm was greater than 4 cm at repair and is now down to 3 cm and then the left popliteal aneurysm was over 2 cm and is down to 1.3 cm.  I do not think there is any evidence of compressive symptoms.  He does have likely venous insufficiency given harvest of bilateral great saphenous veins and he has skin thickening on exam consistent with venous insufficiency.  I will put him back in knee-high compression 15 to 20 mmHg.  I will refer him back to physical therapy just because he is saying he is having some range of motion issues and have him follow-up with me in 3 months.  Also discussed that he may want to see his PCP to rule out any other etiologies since his bypasses seem to be doing well and his aneurysms are getting smaller since being excluded.  Marty Heck, MD [facility] [place] Office: [place]

## 2019-12-13 ENCOUNTER — Telehealth: Payer: Self-pay | Admitting: Vascular Surgery

## 2019-12-25 ENCOUNTER — Ambulatory Visit: Payer: Medicare Other | Admitting: Vascular Surgery

## 2020-01-21 ENCOUNTER — Other Ambulatory Visit (HOSPITAL_COMMUNITY): Payer: Self-pay | Admitting: Internal Medicine

## 2020-01-21 DIAGNOSIS — N182 Chronic kidney disease, stage 2 (mild): Secondary | ICD-10-CM

## 2020-01-29 ENCOUNTER — Encounter: Payer: Self-pay | Admitting: Vascular Surgery

## 2020-01-29 ENCOUNTER — Other Ambulatory Visit: Payer: Self-pay

## 2020-01-29 ENCOUNTER — Ambulatory Visit (INDEPENDENT_AMBULATORY_CARE_PROVIDER_SITE_OTHER): Payer: Medicare Other | Admitting: Vascular Surgery

## 2020-01-29 VITALS — BP 138/91 | HR 91 | Temp 98.2°F | Resp 16 | Ht 68.0 in | Wt 202.0 lb

## 2020-01-29 DIAGNOSIS — I724 Aneurysm of artery of lower extremity: Secondary | ICD-10-CM

## 2020-01-29 NOTE — Progress Notes (Signed)
Patient name: Julian Miranda MRN: [id number] DOB: 1950-04-09 Sex: male  REASON FOR VISIT: 3 month follow-up leg pain/swelling  HPI: Malon Branton is a 69 y.o. male that presents for 3 month follow-up of bilateral leg pain and swelling in the setting of remote bilateral popliteal artery bypasses for exclusion of popliteal aneurysms. When we saw him this past summer in June his arterial duplexes look good with patent bilateral lower extremity bypasses and popliteal aneurysms they were getting smaller. I suspect he had mostly venous insufficiency from vein harvest and put him in compression socks. States his legs are doing much better. He recently went to the beach on a fishing trip. Not having any claudication rest pain or other issues when he walks.  Past Medical History:  Diagnosis Date  . CAD (coronary artery disease)   . GERD (gastroesophageal reflux disease)   . Hypertension   . Myocardial infarction ([place]) 1994  . Peripheral vascular disease ([place])   . Stroke [facility])     Past Surgical History:  Procedure Laterality Date  . BYPASS GRAFT POPLITEAL TO POPLITEAL Right 03/23/2018   Procedure: BYPASS GRAFT RIGHT ABOVE KNEE POPLITEAL TO BELOW KNEE POPLITEAL ARTERY  USING RIGHT GREAT SAPHENOUS VEIN;  Surgeon: Marty Heck, MD;  Location: [place];  Service: Vascular;  Laterality: Right;  . BYPASS GRAFT POPLITEAL TO POPLITEAL Left 04/09/2019   Procedure: BYPASS  ABOVE KNEE POPLITEAL TO BELOW KNEE POPLITEAL AND LIGATION OF LEFT POPITEAL ANEURYSM;  Surgeon: Marty Heck, MD;  Location: [place];  Service: Vascular;  Laterality: Left;  . COLONOSCOPY WITH ESOPHAGOGASTRODUODENOSCOPY (EGD)    . CORONARY ARTERY BYPASS GRAFT    . EYE SURGERY Bilateral    cataracts  . FEMORAL BYPASS Right 03/23/2018  . HERNIA REPAIR    . MASTOIDECTOMY    . ROTATOR CUFF REPAIR Right   . TONSILLECTOMY    . VEIN HARVEST Right 03/23/2018   Procedure: VEIN HARVEST RIGHT GREAT SAPHENOUS;  Surgeon: Marty Heck, MD;  Location: [place];  Service: Vascular;  Laterality: Right;  . VEIN HARVEST Left 04/09/2019   Procedure: Harvest Greater Saphenous Vein and  Revise Elisa Lateral Saphenous Vein ;  Surgeon: Marty Heck, MD;  Location: [facility] OR;  Service: Vascular;  Laterality: Left;    Family History  Problem Relation Age of Onset  . Heart disease Mother     SOCIAL HISTORY: Social History   Tobacco Use  . Smoking status: Never Smoker  . Smokeless tobacco: Never Used  Substance Use Topics  . Alcohol use: Never    Allergies  Allergen Reactions  . Other   . Vancomycin     Other reaction(s): Red Man Syndrome Noted intraoperatively on 10/04/2018. No associated hemodynamic or respiratory changes. Please give slowly.    Current Outpatient Medications  Medication Sig Dispense Refill  . ALPRAZolam (XANAX) 0.25 MG tablet Take 0.25 mg by mouth in the morning, at noon, in the evening, and at bedtime.     Marland Kitchen aspirin 81 MG tablet Take 81 mg by mouth daily.    Marland Kitchen atorvastatin (LIPITOR) 80 MG tablet Take 80 mg by mouth at bedtime.     . carvedilol (COREG) 25 MG tablet Take 25 mg by mouth daily.     . diazepam (VALIUM) 2 MG tablet Take 2 mg by mouth daily.    . furosemide (LASIX) 20 MG tablet Take 20 mg by mouth every Monday, Wednesday, and Friday.     . icosapent Ethyl (VASCEPA) 1  g capsule Take 1 g by mouth 2 (two) times daily.    . Iron-Vitamin C (VITRON-C) 65-125 MG TABS Take 1 tablet by mouth daily.    Marland Kitchen lisinopril (ZESTRIL) 20 MG tablet Take 20 mg by mouth in the morning and at bedtime.     . meclizine (ANTIVERT) 25 MG tablet Take 25 mg by mouth 2 (two) times daily as needed for dizziness.     . melatonin 5 MG TABS Take 10 mg by mouth at bedtime.     . Multiple Minerals (CALCIUM/MAGNESIUM/ZINC PO) Take 1 tablet by mouth at bedtime.    Marland Kitchen omeprazole (PRILOSEC) 20 MG capsule Take 20 mg by mouth 2 (two) times daily.     . potassium chloride (KLOR-CON) 10 MEQ tablet Take 10 mEq by mouth 2  (two) times daily.     . vitamin B-12 (CYANOCOBALAMIN) 1000 MCG tablet Take 1,000 mcg by mouth daily.    Marland Kitchen zinc gluconate 50 MG tablet Take 50 mg by mouth daily.     Marland Kitchen acetaminophen (TYLENOL) 500 MG tablet Take 500 mg by mouth 2 (two) times daily.  (Patient not taking: Reported on 01/28/2020)    . amiodarone (PACERONE) 200 MG tablet Take 200 mg by mouth daily.     Marland Kitchen oxyCODONE (OXY IR/ROXICODONE) 5 MG immediate release tablet Take 1 tablet (5 mg total) by mouth every 6 (six) hours as needed for moderate pain. (Patient not taking: Reported on 09/18/2019) 20 tablet 0   No current facility-administered medications for this visit.    REVIEW OF SYSTEMS:  [X]  denotes positive finding, [ ]  denotes negative finding Cardiac  Comments:  Chest pain or chest pressure:    Shortness of breath upon exertion:    Short of breath when lying flat:    Irregular heart rhythm:        Vascular    Pain in calf, thigh, or hip brought on by ambulation:    Pain in feet at night that wakes you up from your sleep:     Blood clot in your veins:    Leg swelling:         Pulmonary    Oxygen at home:    Productive cough:     Wheezing:         Neurologic    Sudden weakness in arms or legs:     Sudden numbness in arms or legs:     Sudden onset of difficulty speaking or slurred speech:    Temporary loss of vision in one eye:     Problems with dizziness:         Gastrointestinal    Blood in stool:     Vomited blood:         Genitourinary    Burning when urinating:     Blood in urine:        Psychiatric    Major depression:         Hematologic    Bleeding problems:    Problems with blood clotting too easily:        Skin    Rashes or ulcers:        Constitutional    Fever or chills:      PHYSICAL EXAM: Vitals:   01/29/20 1221  BP: (!) 138/91  Pulse: 91  Resp: 16  Temp: 98.2 F (36.8 C)  TempSrc: Temporal  SpO2: 92%  Weight: 202 lb (91.6 kg)  Height: 5\' 8"  (1.727 m)    GENERAL: The  patient is a well-nourished male, in no acute distress. The vital signs are documented above. CARDIAC: There is a regular rate and rhythm.  VASCULAR:  Bilateral leg incisions previously well healed Bilateral DP pulses palpable ABDOMEN: Soft and non-tender. MUSCULOSKELETAL: There are no major deformities or cyanosis. NEUROLOGIC: No focal weakness or paresthesias are detected.  DATA:   No new studies today, previous arterial duplex from 09/2019 patent bypasses bilateral lower extremities.  Assessment/Plan:  69 year old male presents for follow-up of lower extremity swelling and pain following remote bilateral popliteal artery bypasses for exclusion of popliteal aneurysms. On exam today he has palpable pedal pulses. All of his vague leg pain he was having and the swelling have resolved. I do not think he has any compressive symptoms given his symptom resolution and the fact that his aneurysms were getting smaller on previous duplexes this summer. We will have him follow-up in 1 year with ABIs and lower extremity arterial duplexes for ongoing surveillance. He knows to call with questions or concerns.   Marty Heck, MD [facility] [place] Office: [place]

## 2020-01-31 ENCOUNTER — Other Ambulatory Visit: Payer: Self-pay | Admitting: Radiology

## 2020-01-31 NOTE — H&P (Signed)
Chief Complaint: Patient was seen in consultation today for random renal biopsy  Referring Physician(s): Reesa Chew  Supervising Physician: Corrie Mckusick  Patient Status: [facility] - Out-pt  History of Present Illness: Julian Miranda is a 69 y.o. male with a medical history significant for CAD, HTN, MI, PVD and chronic kidney disease. Recent lab work has shown elevations in his kidney function values as well as proteinuria.  Interventional Radiology has been asked to evaluate this patient for a random renal biopsy for further work up.   Past Medical History:  Diagnosis Date  . CAD (coronary artery disease)   . GERD (gastroesophageal reflux disease)   . Hypertension   . Myocardial infarction ([place]) 1994  . Peripheral vascular disease ([place])   . Stroke [facility])     Past Surgical History:  Procedure Laterality Date  . BYPASS GRAFT POPLITEAL TO POPLITEAL Right 03/23/2018   Procedure: BYPASS GRAFT RIGHT ABOVE KNEE POPLITEAL TO BELOW KNEE POPLITEAL ARTERY  USING RIGHT GREAT SAPHENOUS VEIN;  Surgeon: Marty Heck, MD;  Location: [place];  Service: Vascular;  Laterality: Right;  . BYPASS GRAFT POPLITEAL TO POPLITEAL Left 04/09/2019   Procedure: BYPASS  ABOVE KNEE POPLITEAL TO BELOW KNEE POPLITEAL AND LIGATION OF LEFT POPITEAL ANEURYSM;  Surgeon: Marty Heck, MD;  Location: [place];  Service: Vascular;  Laterality: Left;  . COLONOSCOPY WITH ESOPHAGOGASTRODUODENOSCOPY (EGD)    . CORONARY ARTERY BYPASS GRAFT    . EYE SURGERY Bilateral    cataracts  . FEMORAL BYPASS Right 03/23/2018  . HERNIA REPAIR    . MASTOIDECTOMY    . ROTATOR CUFF REPAIR Right   . TONSILLECTOMY    . VEIN HARVEST Right 03/23/2018   Procedure: VEIN HARVEST RIGHT GREAT SAPHENOUS;  Surgeon: Marty Heck, MD;  Location: [place];  Service: Vascular;  Laterality: Right;  . VEIN HARVEST Left 04/09/2019   Procedure: Harvest Greater Saphenous Vein and  Revise Elisa Lateral Saphenous Vein ;  Surgeon: Marty Heck, MD;  Location: [place];  Service: Vascular;  Laterality: Left;    Allergies: Other and Vancomycin  Medications: Prior to Admission medications   Medication Sig Start Date End Date Taking? Authorizing Provider  ALPRAZolam (XANAX) 0.25 MG tablet Take 0.25 mg by mouth in the morning, at noon, in the evening, and at bedtime.    Yes [provider]  aspirin 81 MG tablet Take 81 mg by mouth daily.   Yes [provider]  atorvastatin (LIPITOR) 80 MG tablet Take 80 mg by mouth at bedtime.  02/06/18  Yes [provider]  carvedilol (COREG) 25 MG tablet Take 25 mg by mouth daily.    Yes [provider]  diazepam (VALIUM) 2 MG tablet Take 2 mg by mouth daily. 11/07/19  Yes [provider]  furosemide (LASIX) 20 MG tablet Take 20 mg by mouth every Monday, Wednesday, and Friday.  10/24/18  Yes [provider]  icosapent Ethyl (VASCEPA) 1 g capsule Take 1 g by mouth 2 (two) times daily.   Yes [provider]  Iron-Vitamin C (VITRON-C) 65-125 MG TABS Take 1 tablet by mouth daily.   Yes [provider]  lisinopril (ZESTRIL) 20 MG tablet Take 20 mg by mouth in the morning and at bedtime.    Yes [provider]  meclizine (ANTIVERT) 25 MG tablet Take 25 mg by mouth 2 (two) times daily as needed for dizziness.    Yes [provider]  melatonin 5 MG TABS  Take 10 mg by mouth at bedtime.    Yes [provider]  Multiple Minerals (CALCIUM/MAGNESIUM/ZINC PO) Take 1 tablet by mouth at bedtime.   Yes [provider]  omeprazole (PRILOSEC) 20 MG capsule Take 20 mg by mouth 2 (two) times daily.  01/02/18  Yes [provider]  potassium chloride (KLOR-CON) 10 MEQ tablet Take 10 mEq by mouth 2 (two) times daily.  10/24/18  Yes [provider]  vitamin B-12 (CYANOCOBALAMIN) 1000 MCG tablet Take 1,000 mcg by mouth daily.   Yes [provider]  acetaminophen (TYLENOL) 500 MG tablet  Take 500 mg by mouth 2 (two) times daily.  Patient not taking: Reported on 01/28/2020 10/08/18   [provider]  amiodarone (PACERONE) 200 MG tablet Take 200 mg by mouth daily.  10/09/18 10/09/19  [provider]  oxyCODONE (OXY IR/ROXICODONE) 5 MG immediate release tablet Take 1 tablet (5 mg total) by mouth every 6 (six) hours as needed for moderate pain. Patient not taking: Reported on 09/18/2019 04/11/19   Ulyses Amor, PA-C  zinc gluconate 50 MG tablet Take 50 mg by mouth daily.     [provider]     Family History  Problem Relation Age of Onset  . Heart disease Mother     Social History   Socioeconomic History  . Marital status: Married    Spouse name: Not on file  . Number of children: Not on file  . Years of education: Not on file  . Highest education level: Not on file  Occupational History  . Not on file  Tobacco Use  . Smoking status: Never Smoker  . Smokeless tobacco: Never Used  Vaping Use  . Vaping Use: Never used  Substance and Sexual Activity  . Alcohol use: Never  . Drug use: Never  . Sexual activity: Not on file  Other Topics Concern  . Not on file  Social History Narrative  . Not on file   Social Determinants of Health   Financial Resource Strain:   . Difficulty of Paying Living Expenses: Not on file  Food Insecurity:   . Worried About Charity fundraiser in the Last Year: Not on file  . Ran Out of Food in the Last Year: Not on file  Transportation Needs:   . Lack of Transportation (Medical): Not on file  . Lack of Transportation (Non-Medical): Not on file  Physical Activity:   . Days of Exercise per Week: Not on file  . Minutes of Exercise per Session: Not on file  Stress:   . Feeling of Stress : Not on file  Social Connections:   . Frequency of Communication with Friends and Family: Not on file  . Frequency of Social Gatherings with Friends and Family: Not on file  . Attends Religious Services: Not on file  . Active  Member of Clubs or Organizations: Not on file  . Attends Archivist Meetings: Not on file  . Marital Status: Not on file    Review of Systems: A 12 point ROS discussed and pertinent positives are indicated in the HPI above.  All other systems are negative.  Review of Systems  Constitutional: Negative for appetite change and fatigue.  Respiratory: Negative for cough and shortness of breath.   Cardiovascular: Negative for chest pain and leg swelling.  Gastrointestinal: Negative for abdominal pain, diarrhea, nausea and vomiting.  Genitourinary: Negative for difficulty urinating and flank pain.  Musculoskeletal: Negative for back pain.  Neurological:  Negative for dizziness and headaches.    Vital Signs: BP (!) 143/91   Pulse 78   Temp 98.2 F (36.8 C) (Oral)   Resp 16   Ht 5\' 8"  (1.727 m)   Wt 204 lb (92.5 kg)   SpO2 93%   BMI 31.02 kg/m   Physical Exam Constitutional:      General: He is not in acute distress. HENT:     Mouth/Throat:     Mouth: Mucous membranes are moist.     Pharynx: Oropharynx is clear.  Cardiovascular:     Rate and Rhythm: Normal rate and regular rhythm.     Heart sounds: Murmur heard.   Pulmonary:     Effort: Pulmonary effort is normal.     Breath sounds: Normal breath sounds.  Abdominal:     General: Bowel sounds are normal.     Palpations: Abdomen is soft.  Musculoskeletal:        General: Normal range of motion.     Cervical back: Normal range of motion.  Skin:    General: Skin is warm and dry.  Neurological:     Mental Status: He is alert and oriented to person, place, and time.  Psychiatric:        Mood and Affect: Mood normal.        Behavior: Behavior normal.        Thought Content: Thought content normal.        Judgment: Judgment normal.     Imaging: No results found.  Labs:  CBC: Recent Labs    04/03/19 1409 04/09/19 1215 04/10/19 0341 02/01/20 0605  WBC 8.4 8.2 11.2* 15.5*  HGB 12.7* 12.1* 11.2* 16.6    HCT 40.0 38.9* 36.2* 50.1  PLT 263 251 269 191    COAGS: Recent Labs    04/09/19 0635 02/01/20 0605  INR 1.1 0.9  APTT 31  --     BMP: Recent Labs    04/03/19 1409 04/09/19 1215 04/10/19 0341  NA 138  --  136  K 4.3  --  4.5  CL 104  --  99  CO2 24  --  27  GLUCOSE 96  --  130*  BUN 15  --  16  CALCIUM 9.3  --  8.8*  CREATININE 1.15 1.00 0.94  GFRNONAA >60 >60 >60  GFRAA >60 >60 >60    LIVER FUNCTION TESTS: No results for input(s): BILITOT, AST, ALT, ALKPHOS, PROT, ALBUMIN in the last 8760 hours.  TUMOR MARKERS: No results for input(s): AFPTM, CEA, CA199, CHROMGRNA in the last 8760 hours.  Assessment and Plan:  Chronic kidney disease: Xavion Muscat, 69 year old male, presents today to the [facility] Interventional Radiology department for an image-guided random renal biopsy.   Risks and benefits of this procedure were discussed with the patient and/or patient's family including, but not limited to bleeding, infection, damage to adjacent structures or low yield requiring additional tests.  All of the questions were answered and there is agreement to proceed.  The patient has been NPO. Labs and vitals have been reviewed. He does not take any blood thinning medications.   Consent signed and in chart.  Thank you for this interesting consult.  I greatly enjoyed meeting Hayk Divis and look forward to participating in their care.  A copy of this report was sent to the requesting provider on this date.  Electronically Signed: Soyla Dryer, AGACNP-BC [phone] 02/01/2020, 7:22 AM   I spent a total of  30 Minutes  in face to face in clinical consultation, greater than 50% of which was counseling/coordinating care for random renal biopsy.

## 2020-02-01 ENCOUNTER — Encounter (HOSPITAL_COMMUNITY): Payer: Self-pay

## 2020-02-01 ENCOUNTER — Ambulatory Visit (HOSPITAL_COMMUNITY)
Admission: RE | Admit: 2020-02-01 | Discharge: 2020-02-01 | Disposition: A | Discharge status: Home / Self Care | Payer: Medicare Other | Source: Ambulatory Visit | Attending: Internal Medicine | Admitting: Internal Medicine

## 2020-02-01 ENCOUNTER — Other Ambulatory Visit: Payer: Self-pay

## 2020-02-01 DIAGNOSIS — I129 Hypertensive chronic kidney disease with stage 1 through stage 4 chronic kidney disease, or unspecified chronic kidney disease: Secondary | ICD-10-CM | POA: Insufficient documentation

## 2020-02-01 DIAGNOSIS — N182 Chronic kidney disease, stage 2 (mild): Secondary | ICD-10-CM | POA: Diagnosis present

## 2020-02-01 DIAGNOSIS — I252 Old myocardial infarction: Secondary | ICD-10-CM | POA: Diagnosis not present

## 2020-02-01 DIAGNOSIS — Z7982 Long term (current) use of aspirin: Secondary | ICD-10-CM | POA: Diagnosis not present

## 2020-02-01 DIAGNOSIS — Z8249 Family history of ischemic heart disease and other diseases of the circulatory system: Secondary | ICD-10-CM | POA: Diagnosis not present

## 2020-02-01 DIAGNOSIS — E8581 Light chain (AL) amyloidosis: Secondary | ICD-10-CM | POA: Diagnosis not present

## 2020-02-01 DIAGNOSIS — I2510 Atherosclerotic heart disease of native coronary artery without angina pectoris: Secondary | ICD-10-CM | POA: Insufficient documentation

## 2020-02-01 DIAGNOSIS — I739 Peripheral vascular disease, unspecified: Secondary | ICD-10-CM | POA: Diagnosis not present

## 2020-02-01 DIAGNOSIS — Z79899 Other long term (current) drug therapy: Secondary | ICD-10-CM | POA: Insufficient documentation

## 2020-02-01 LAB — CBC
HCT: 50.1 % (ref 39.0–52.0)
Hemoglobin: 16.6 g/dL (ref 13.0–17.0)
MCH: 30.7 pg (ref 26.0–34.0)
MCHC: 33.1 g/dL (ref 30.0–36.0)
MCV: 92.8 fL (ref 80.0–100.0)
Platelets: 191 10*3/uL (ref 150–400)
RBC: 5.4 MIL/uL (ref 4.22–5.81)
RDW: 16.6 % — ABNORMAL HIGH (ref 11.5–15.5)
WBC: 15.5 10*3/uL — ABNORMAL HIGH (ref 4.0–10.5)
nRBC: 0 % (ref 0.0–0.2)

## 2020-02-01 LAB — PROTIME-INR
INR: 0.9 (ref 0.8–1.2)
Prothrombin Time: 12.2 seconds (ref 11.4–15.2)

## 2020-02-01 MED ORDER — GELATIN ABSORBABLE 12-7 MM EX MISC
CUTANEOUS | Status: AC
  Filled 2020-02-01: qty 1

## 2020-02-01 MED ORDER — MIDAZOLAM HCL 2 MG/2ML IJ SOLN
INTRAMUSCULAR | Status: AC
  Filled 2020-02-01: qty 2

## 2020-02-01 MED ORDER — FENTANYL CITRATE (PF) 100 MCG/2ML IJ SOLN
INTRAMUSCULAR | Status: AC
  Filled 2020-02-01: qty 2

## 2020-02-01 MED ORDER — SODIUM CHLORIDE 0.9 % IV SOLN: INTRAVENOUS | Status: DC

## 2020-02-01 MED ORDER — SODIUM CHLORIDE 0.9 % IV SOLN
INTRAVENOUS | Status: AC | PRN
  Administered 2020-02-01: 10 mL/h via INTRAVENOUS

## 2020-02-01 MED ORDER — LIDOCAINE HCL (PF) 1 % IJ SOLN
INTRAMUSCULAR | Status: AC
  Filled 2020-02-01: qty 30

## 2020-02-01 NOTE — Discharge Instructions (Addendum)
Percutaneous Kidney Biopsy, Care After This sheet gives you information about how to care for yourself after your procedure. Your health care provider may also give you more specific instructions. If you have problems or questions, contact your health care provider. What can I expect after the procedure? After the procedure, it is common to have:  Pain or soreness near the biopsy site.  Pink or cloudy urine for 24 hours after the procedure. Follow these instructions at home: Activity  Return to your normal activities as told by your health care provider. Ask your health care provider what activities are safe for you.  If you were given a sedative during the procedure, it can affect you for several hours. Do not drive or operate machinery until your health care provider says that it is safe.  Do not lift anything that is heavier than 10 lb (4.5 kg), or the limit that you are told, until your health care provider says that it is safe.  Avoid activities that take a lot of effort (are strenuous) until your health care provider approves. Most people will have to wait 2 weeks before returning to activities such as exercise or sex. General instructions   Take over-the-counter and prescription medicines only as told by your health care provider.  You may eat and drink after your procedure. Follow instructions from your health care provider about eating or drinking restrictions.  Check your biopsy site every day for signs of infection. Check for: ? More redness, swelling, or pain. ? Fluid or blood. ? Warmth. ? Pus or a bad smell.  Keep all follow-up visits as told by your health care provider. This is important. Contact a health care provider if:  You have more redness, swelling, or pain around your biopsy site.  You have fluid or blood coming from your biopsy site.  Your biopsy site feels warm to the touch.  You have pus or a bad smell coming from your biopsy site.  You have blood  in your urine more than 24 hours after your procedure.  You have a fever. Get help right away if:  Your urine is dark red or brown.  You cannot urinate.  It burns when you urinate.  You feel dizzy or light-headed.  You have severe pain in your abdomen or side. Summary  After the procedure, it is common to have pain or soreness at the biopsy site and pink or cloudy urine for the first 24 hours.  Check your biopsy site each day for signs of infection, such as more redness, swelling, or pain; fluid, blood, pus or a bad smell coming from the biopsy site; or the biopsy site feeling warm to touch.  Return to your normal activities as told by your health care provider. This information is not intended to replace advice given to you by your health care provider. Make sure you discuss any questions you have with your health care provider. Document Revised: 11/23/2018 Document Reviewed: 11/23/2018 Elsevier Patient Education  2020 Else[employer] Inc. Moderate Conscious Sedation, Adult Sedation is the use of medicines to promote relaxation and relieve discomfort and anxiety. Moderate conscious sedation is a type of sedation. Under moderate conscious sedation, you are less alert than normal, but you are still able to respond to instructions, touch, or both. Moderate conscious sedation is used during short medical and dental procedures. It is milder than deep sedation, which is a type of sedation under which you cannot be easily woken up. It is also milder than   general anesthesia, which is the use of medicines to make you unconscious. Moderate conscious sedation allows you to return to your regular activities sooner. Tell a health care provider about:  Any allergies you have.  All medicines you are taking, including vitamins, herbs, eye drops, creams, and over-the-counter medicines.  Use of steroids (by mouth or creams).  Any problems you or family members have had with sedatives and anesthetic  medicines.  Any blood disorders you have.  Any surgeries you have had.  Any medical conditions you have, such as sleep apnea.  Whether you are pregnant or may be pregnant.  Any use of cigarettes, alcohol, marijuana, or street drugs. What are the risks? Generally, this is a safe procedure. However, problems may occur, including:  Getting too much medicine (oversedation).  Nausea.  Allergic reaction to medicines.  Trouble breathing. If this happens, a breathing tube may be used to help with breathing. It will be removed when you are awake and breathing on your own.  Heart trouble.  Lung trouble. What happens before the procedure? Staying hydrated Follow instructions from your health care provider about hydration, which may include:  Up to 2 hours before the procedure - you may continue to drink clear liquids, such as water, clear fruit juice, black coffee, and plain tea. Eating and drinking restrictions Follow instructions from your health care provider about eating and drinking, which may include:  8 hours before the procedure - stop eating heavy meals or foods such as meat, fried foods, or fatty foods.  6 hours before the procedure - stop eating light meals or foods, such as toast or cereal.  6 hours before the procedure - stop drinking milk or drinks that contain milk.  2 hours before the procedure - stop drinking clear liquids. Medicine Ask your health care provider about:  Changing or stopping your regular medicines. This is especially important if you are taking diabetes medicines or blood thinners.  Taking medicines such as aspirin and ibuprofen. These medicines can thin your blood. Do not take these medicines before your procedure if your health care provider instructs you not to.  Tests and exams  You will have a physical exam.  You may have blood tests done to show: ? How well your kidneys and liver are working. ? How well your blood can clot. General  instructions  Plan to have someone take you home from the hospital or clinic.  If you will be going home right after the procedure, plan to have someone with you for 24 hours. What happens during the procedure?  An IV tube will be inserted into one of your veins.  Medicine to help you relax (sedative) will be given through the IV tube.  The medical or dental procedure will be performed. What happens after the procedure?  Your blood pressure, heart rate, breathing rate, and blood oxygen level will be monitored often until the medicines you were given have worn off.  Do not drive for 24 hours. This information is not intended to replace advice given to you by your health care provider. Make sure you discuss any questions you have with your health care provider. Document Revised: 03/04/2017 Document Reviewed: 07/12/2015 Elsevier Patient Education  2020 Elsevier Inc.  

## 2020-02-01 NOTE — Procedures (Signed)
Interventional Radiology Procedure Note  Procedure: US guided biopsy of left kidney, medical renal Complications: None EBL: None Recommendations: - Bedrest 2 hours.   - Routine wound care - Follow up pathology - Advance diet   Signed,  Munira Polson, DO   

## 2020-02-19 LAB — SURGICAL PATHOLOGY

## 2020-02-21 ENCOUNTER — Other Ambulatory Visit: Payer: Self-pay

## 2020-02-21 ENCOUNTER — Inpatient Hospital Stay (HOSPITAL_COMMUNITY): Payer: Medicare Other | Attending: Hematology | Admitting: Hematology

## 2020-02-21 ENCOUNTER — Other Ambulatory Visit (HOSPITAL_COMMUNITY): Payer: Self-pay

## 2020-02-21 ENCOUNTER — Inpatient Hospital Stay (HOSPITAL_COMMUNITY): Payer: Medicare Other

## 2020-02-21 VITALS — BP 158/87 | HR 76 | Temp 97.3°F | Resp 17 | Wt 200.5 lb

## 2020-02-21 DIAGNOSIS — R634 Abnormal weight loss: Secondary | ICD-10-CM | POA: Diagnosis not present

## 2020-02-21 DIAGNOSIS — I10 Essential (primary) hypertension: Secondary | ICD-10-CM | POA: Insufficient documentation

## 2020-02-21 DIAGNOSIS — Z809 Family history of malignant neoplasm, unspecified: Secondary | ICD-10-CM | POA: Insufficient documentation

## 2020-02-21 DIAGNOSIS — E8581 Light chain (AL) amyloidosis: Secondary | ICD-10-CM | POA: Diagnosis present

## 2020-02-21 DIAGNOSIS — I252 Old myocardial infarction: Secondary | ICD-10-CM | POA: Insufficient documentation

## 2020-02-21 DIAGNOSIS — R51 Headache with orthostatic component, not elsewhere classified: Secondary | ICD-10-CM | POA: Diagnosis not present

## 2020-02-21 DIAGNOSIS — K219 Gastro-esophageal reflux disease without esophagitis: Secondary | ICD-10-CM | POA: Insufficient documentation

## 2020-02-21 DIAGNOSIS — Z79899 Other long term (current) drug therapy: Secondary | ICD-10-CM | POA: Insufficient documentation

## 2020-02-21 DIAGNOSIS — I739 Peripheral vascular disease, unspecified: Secondary | ICD-10-CM | POA: Insufficient documentation

## 2020-02-21 DIAGNOSIS — Z8673 Personal history of transient ischemic attack (TIA), and cerebral infarction without residual deficits: Secondary | ICD-10-CM | POA: Diagnosis not present

## 2020-02-21 DIAGNOSIS — R110 Nausea: Secondary | ICD-10-CM | POA: Insufficient documentation

## 2020-02-21 DIAGNOSIS — I2510 Atherosclerotic heart disease of native coronary artery without angina pectoris: Secondary | ICD-10-CM | POA: Insufficient documentation

## 2020-02-21 DIAGNOSIS — E859 Amyloidosis, unspecified: Secondary | ICD-10-CM | POA: Insufficient documentation

## 2020-02-21 LAB — CBC WITH DIFFERENTIAL/PLATELET
Abs Immature Granulocytes: 0.07 10*3/uL (ref 0.00–0.07)
Basophils Absolute: 0.1 10*3/uL (ref 0.0–0.1)
Basophils Relative: 1 %
Eosinophils Absolute: 0.9 10*3/uL — ABNORMAL HIGH (ref 0.0–0.5)
Eosinophils Relative: 7 %
HCT: 52 % (ref 39.0–52.0)
Hemoglobin: 17.2 g/dL — ABNORMAL HIGH (ref 13.0–17.0)
Immature Granulocytes: 1 %
Lymphocytes Relative: 24 %
Lymphs Abs: 3.3 10*3/uL (ref 0.7–4.0)
MCH: 31.1 pg (ref 26.0–34.0)
MCHC: 33.1 g/dL (ref 30.0–36.0)
MCV: 94 fL (ref 80.0–100.0)
Monocytes Absolute: 0.9 10*3/uL (ref 0.1–1.0)
Monocytes Relative: 6 %
Neutro Abs: 8.4 10*3/uL — ABNORMAL HIGH (ref 1.7–7.7)
Neutrophils Relative %: 61 %
Platelets: 155 10*3/uL (ref 150–400)
RBC: 5.53 MIL/uL (ref 4.22–5.81)
RDW: 17.4 % — ABNORMAL HIGH (ref 11.5–15.5)
WBC: 13.6 10*3/uL — ABNORMAL HIGH (ref 4.0–10.5)
nRBC: 0 % (ref 0.0–0.2)

## 2020-02-21 LAB — COMPREHENSIVE METABOLIC PANEL
ALT: 12 U/L (ref 0–44)
AST: 18 U/L (ref 15–41)
Albumin: 2.6 g/dL — ABNORMAL LOW (ref 3.5–5.0)
Alkaline Phosphatase: 129 U/L — ABNORMAL HIGH (ref 38–126)
Anion gap: 12 (ref 5–15)
BUN: 24 mg/dL — ABNORMAL HIGH (ref 8–23)
CO2: 20 mmol/L — ABNORMAL LOW (ref 22–32)
Calcium: 8.7 mg/dL — ABNORMAL LOW (ref 8.9–10.3)
Chloride: 103 mmol/L (ref 98–111)
Creatinine, Ser: 1.37 mg/dL — ABNORMAL HIGH (ref 0.61–1.24)
GFR, Estimated: 55 mL/min — ABNORMAL LOW (ref >60)
Glucose, Bld: 101 mg/dL — ABNORMAL HIGH (ref 70–99)
Potassium: 4.5 mmol/L (ref 3.5–5.1)
Sodium: 135 mmol/L (ref 135–145)
Total Bilirubin: 0.6 mg/dL (ref 0.3–1.2)
Total Protein: 5.9 g/dL — ABNORMAL LOW (ref 6.5–8.1)

## 2020-02-21 LAB — URIC ACID: Uric Acid, Serum: 6.4 mg/dL (ref 3.7–8.6)

## 2020-02-21 LAB — LACTATE DEHYDROGENASE: LDH: 216 U/L — ABNORMAL HIGH (ref 98–192)

## 2020-02-21 LAB — APTT: aPTT: 29 seconds (ref 24–36)

## 2020-02-21 LAB — PROTIME-INR
INR: 1 (ref 0.8–1.2)
Prothrombin Time: 12.7 seconds (ref 11.4–15.2)

## 2020-02-21 MED ORDER — TRAMADOL HCL 50 MG PO TABS: 50.0000 mg | ORAL_TABLET | Freq: Three times a day (TID) | ORAL | 0 refills | Status: DC | PRN

## 2020-02-21 MED ORDER — PROCHLORPERAZINE MALEATE 10 MG PO TABS: 10.0000 mg | ORAL_TABLET | Freq: Four times a day (QID) | ORAL | 0 refills | Status: DC | PRN

## 2020-02-21 NOTE — Progress Notes (Addendum)
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 Santiago Bumpers from [facility].  Today he is accompanied by his wife, Julian Miranda, and he reports feeling well. He reports that he went to his doctor for a routine check and was told that he had proteinuria, for which he was directed to the nephrologist. He reports feeling a bit more tired and his appetite has decreased; he has dropped 48 pounds since the beginning of summer. He reports having occasional nausea after eating without vomiting and a persistent posterior headache which started several weeks ago; he never had similar symptoms prior though he has Meniere's disease for which he takes meclizine and Valium. He denies having any other bodily pains. He denies having diarrhea or constipation. He denies having numbness or tingling, though he is having increasingly worse left hand cramps which is new. He had an MI at 57 with angioplasty and a CABG in 2020; his cardiologist indicated that he might have mild heart failure.  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 He is a non-smoker. His 2 maternal first cousins had GI-related cancers; his maternal aunt had cervical cancer.   MEDICAL HISTORY:  Past Medical History:  Diagnosis Date  . CAD (coronary artery disease)   . GERD (gastroesophageal reflux disease)   . Hypertension   . Myocardial infarction ([place]) 1994  . Peripheral vascular disease ([place])   . Stroke [facility])     SURGICAL HISTORY: Past Surgical History:  Procedure Laterality Date  . BYPASS GRAFT POPLITEAL TO POPLITEAL Right  03/23/2018   Procedure: BYPASS GRAFT RIGHT ABOVE KNEE POPLITEAL TO BELOW KNEE POPLITEAL ARTERY  USING RIGHT GREAT SAPHENOUS VEIN;  Surgeon: Marty Heck, MD;  Location: [place];  Service: Vascular;  Laterality: Right;  . BYPASS GRAFT POPLITEAL TO POPLITEAL Left 04/09/2019   Procedure: BYPASS  ABOVE KNEE POPLITEAL TO BELOW KNEE POPLITEAL AND LIGATION OF LEFT POPITEAL ANEURYSM;  Surgeon: Marty Heck, MD;  Location: [place];  Service: Vascular;  Laterality: Left;  . COLONOSCOPY WITH ESOPHAGOGASTRODUODENOSCOPY (EGD)    . CORONARY ARTERY BYPASS GRAFT    . EYE SURGERY Bilateral    cataracts  . FEMORAL BYPASS Right 03/23/2018  . HERNIA REPAIR    . MASTOIDECTOMY    . ROTATOR CUFF REPAIR Right   . TONSILLECTOMY    . VEIN HARVEST Right 03/23/2018   Procedure: VEIN HARVEST RIGHT GREAT SAPHENOUS;  Surgeon: Marty Heck, MD;  Location: [place];  Service: Vascular;  Laterality: Right;  . VEIN HARVEST Left 04/09/2019   Procedure: Harvest Greater Saphenous Vein and  Revise Elisa Lateral Saphenous Vein ;  Surgeon: Marty Heck, MD;  Location: [place];  Service: Vascular;  Laterality: Left;    SOCIAL HISTORY: Social History   Socioeconomic History  . Marital status: Married    Spouse name: Not on file  . Number of children: Not on file  . Years of education: Not on file  . Highest education level: Not on file  Occupational History  . Not on file  Tobacco Use  . Smoking status: Never Smoker  . Smokeless tobacco:  Never Used  Vaping Use  . Vaping Use: Never used  Substance and Sexual Activity  . Alcohol use: Never  . Drug use: Never  . Sexual activity: Not on file  Other Topics Concern  . Not on file  Social History Narrative  . Not on file   Social Determinants of Health   Financial Resource Strain: Low Risk   . Difficulty of Paying Living Expenses: Not hard at all  Food Insecurity: No Food Insecurity  . Worried About Charity fundraiser in the Last Year: Never true    . Ran Out of Food in the Last Year: Never true  Transportation Needs: No Transportation Needs  . Lack of Transportation (Medical): No  . Lack of Transportation (Non-Medical): No  Physical Activity: Insufficiently Active  . Days of Exercise per Week: 2 days  . Minutes of Exercise per Session: 20 min  Stress: Stress Concern Present  . Feeling of Stress : To some extent  Social Connections: Moderately Integrated  . Frequency of Communication with Friends and Family: More than three times a week  . Frequency of Social Gatherings with Friends and Family: Twice a week  . Attends Religious Services: More than 4 times per year  . Active Member of Clubs or Organizations: No  . Attends Archivist Meetings: Never  . Marital Status: Married  Human resources officer Violence: Not At Risk  . Fear of Current or Ex-Partner: No  . Emotionally Abused: No  . Physically Abused: No  . Sexually Abused: No    FAMILY HISTORY: Family History  Problem Relation Age of Onset  . Heart disease Mother     ALLERGIES:  is allergic to other and vancomycin.  MEDICATIONS:  Current Outpatient Medications  Medication Sig Dispense Refill  . acetaminophen (TYLENOL) 500 MG tablet Take 500 mg by mouth 2 (two) times daily.     Marland Kitchen ALPRAZolam (XANAX) 0.25 MG tablet Take 0.25 mg by mouth in the morning, at noon, in the evening, and at bedtime.     Marland Kitchen aspirin 81 MG tablet Take 81 mg by mouth daily.    Marland Kitchen atorvastatin (LIPITOR) 80 MG tablet Take 80 mg by mouth at bedtime.     . carvedilol (COREG) 25 MG tablet Take 25 mg by mouth daily.     . diazepam (VALIUM) 2 MG tablet Take 2 mg by mouth daily.    . furosemide (LASIX) 20 MG tablet Take 20 mg by mouth every Monday, Wednesday, and Friday.     . icosapent Ethyl (VASCEPA) 1 g capsule Take 1 g by mouth 2 (two) times daily.    . Iron-Vitamin C (VITRON-C) 65-125 MG TABS Take 1 tablet by mouth daily.    Marland Kitchen lisinopril (ZESTRIL) 20 MG tablet Take 20 mg by mouth in the morning  and at bedtime.     . meclizine (ANTIVERT) 25 MG tablet Take 25 mg by mouth 2 (two) times daily as needed for dizziness.     . melatonin 5 MG TABS Take 10 mg by mouth at bedtime.     . Multiple Minerals (CALCIUM/MAGNESIUM/ZINC PO) Take 1 tablet by mouth at bedtime.    Marland Kitchen omeprazole (PRILOSEC) 20 MG capsule Take 20 mg by mouth 2 (two) times daily.     . potassium chloride (KLOR-CON) 10 MEQ tablet Take 10 mEq by mouth 2 (two) times daily.     . vitamin B-12 (CYANOCOBALAMIN) 1000 MCG tablet Take 1,000 mcg by mouth daily.    Marland Kitchen zinc  gluconate 50 MG tablet Take 50 mg by mouth daily.     . prochlorperazine (COMPAZINE) 10 MG tablet Take 1 tablet (10 mg total) by mouth every 6 (six) hours as needed for nausea or vomiting. 30 tablet 0   No current facility-administered medications for this visit.    REVIEW OF SYSTEMS:   Review of Systems  Constitutional: Positive for appetite change (50%), fatigue (50%) and unexpected weight change (lost 48 lbs in 6 months).  Gastrointestinal: Positive for nausea. Negative for constipation and diarrhea.  Genitourinary: Positive for frequency.   Musculoskeletal: Negative for myalgias.  Neurological: Positive for dizziness (d/t headache and Meniere's) and headaches (5/10 pain). Negative for numbness.  Psychiatric/Behavioral: Positive for sleep disturbance.  All other systems reviewed and are negative.    PHYSICAL EXAMINATION: ECOG PERFORMANCE STATUS: 1 - Symptomatic but completely ambulatory  Vitals:   02/21/20 1330  BP: (!) 158/87  Pulse: 76  Resp: 17  Temp: (!) 97.3 F (36.3 C)  SpO2: 96%   Filed Weights   02/21/20 1330  Weight: 200 lb 8 oz (90.9 kg)   Physical Exam Vitals reviewed.  Constitutional:      Appearance: Normal appearance. He is obese.  Cardiovascular:     Rate and Rhythm: Normal rate and regular rhythm.     Pulses: Normal pulses.     Heart sounds: Normal heart sounds.  Pulmonary:     Effort: Pulmonary effort is normal.     Breath  sounds: Normal breath sounds.  Abdominal:     Palpations: Abdomen is soft. There is hepatomegaly (1 fingerbreath below costal margin). There is no splenomegaly or mass.     Tenderness: There is no abdominal tenderness.     Hernia: No hernia is present.  Musculoskeletal:     Right lower leg: Edema (trace) present.     Left lower leg: Edema (trace) present.  Lymphadenopathy:     Upper Body:     Right upper body: No supraclavicular, axillary or pectoral adenopathy.     Left upper body: No supraclavicular, axillary or pectoral adenopathy.     Lower Body: No right inguinal adenopathy. No left inguinal adenopathy.  Neurological:     General: No focal deficit present.     Mental Status: He is alert and oriented to person, place, and time.  Psychiatric:        Mood and Affect: Mood normal.        Behavior: Behavior normal.      LABORATORY DATA:  I have reviewed the data as listed Recent Results (from the past 2160 hour(s))  CBC upon arrival     Status: Abnormal   Collection Time: 02/01/20  6:05 AM  Result Value Ref Range   WBC 15.5 (H) 4.0 - 10.5 K/uL   RBC 5.40 4.22 - 5.81 MIL/uL   Hemoglobin 16.6 13.0 - 17.0 g/dL   HCT 50.1 39 - 52 %   MCV 92.8 80.0 - 100.0 fL   MCH 30.7 26.0 - 34.0 pg   MCHC 33.1 30.0 - 36.0 g/dL   RDW 16.6 (H) 11.5 - 15.5 %   Platelets 191 150 - 400 K/uL   nRBC 0.0 0.0 - 0.2 %    Comment: Performed at [facility], [place] [address] [place], [place] [address]  Protime-INR upon arrival     Status: None   Collection Time: 02/01/20  6:05 AM  Result Value Ref Range   Prothrombin Time 12.2 11.4 - 15.2 seconds  Comment: LIPEMIC SPECIMEN SLIGHT HEMOLYSIS    INR 0.9 0.8 - 1.2    Comment: (NOTE) INR goal varies based on device and disease states. Performed at [facility], [place] [address] [place], [place] [address]   Surgical pathology     Status: None   Collection Time: 02/01/20  9:03 AM  Result Value Ref Range   SURGICAL PATHOLOGY       SURGICAL PATHOLOGY CASE: [id number] PATIENT: [facility] Surgical Pathology Report     Clinical History: left medical renal biopsy (cm)   FINAL MICROSCOPIC DIAGNOSIS:  A. KIDNEY, LEFT, NEEDLE CORE BIOPSY: -  See OUTSIDE PATHOLOGY REPORT under RESULTS REVIEW or the LABS TAB under "[facility] KIDNEY BIOPSY SEND OUT" in [facility]  GROSS DESCRIPTION:  Received in saline are two cores of tan pink soft tissue, 1.3 and 1.5 cm in length x 0.1 cm in diameter. Cores are submitted in formalin and Michel's transport media and entirely sent to [facility] laboratory for special testing. (AK 02/01/2020)   Final Diagnosis performed by Claudette Laws, MD.   Electronically signed 02/19/2020 Technical component performed at [facility]. [facility], [place] [address], [place], [place] [address].  Professional component performed at [facility], [place] [address] [place], [place] [address].  Immunohistochemistry Technical component (if applicable) was per formed at [facility]. [address], [place], [place], [place] [address].   IMMUNOHISTOCHEMISTRY DISCLAIMER (if applicable): Some of these immunohistochemical stains may have been developed and the performance characteristics determine by [facility]. Some may not have been cleared or approved by the U.S. Food and Drug Administration. The FDA has determined that such clearance or approval is not necessary. This test is used for clinical purposes. It should not be regarded as investigational or for research. This laboratory is certified under the [place] (CLIA-88) as qualified to perform high complexity clinical laboratory testing.  The controls stained appropriately.     RADIOGRAPHIC STUDIES: I have personally reviewed the radiological images as listed and agreed with the findings in the report. US BIOPSY (KIDNEY)  Result Date:  02/01/2020 INDICATION: 69 year old male with a history of renal dysfunction referred for biopsy EXAM: IMAGE GUIDED RENAL BIOPSY MEDICATIONS: None. ANESTHESIA/SEDATION: Moderate (conscious) sedation was employed during this procedure. A total of Versed 1.0 mg and Fentanyl 50 mcg was administered intravenously. Moderate Sedation Time: 11 minutes. The patient's level of consciousness and vital signs were monitored continuously by radiology nursing throughout the procedure under my direct supervision. FLUOROSCOPY TIME:  Ultrasound COMPLICATIONS: None PROCEDURE: Informed written consent was obtained from the patient after a thorough discussion of the procedural risks, benefits and alternatives. All questions were addressed. Maximal Sterile Barrier Technique was utilized including caps, mask, sterile gowns, sterile gloves, sterile drape, hand hygiene and skin antiseptic. A timeout was performed prior to the initiation of the procedure. Patient was positioned prone position on the gantry table. Images were stored sent to PACs. Once the patient is prepped and draped in the usual sterile fashion, the skin and subcutaneous tissues overlying the left kidney were generously infiltrated 1% lidocaine for local anesthesia. Using ultrasound guidance, a 15 gauge guide needle was advanced into the lower cortex of the left kidney. Once we confirmed location of the needle tip, 2 separate 16 gauge core biopsy were achieved. Two Gel-Foam pledgets were infused with a small amount of saline. The needle was removed. Final images were stored. The patient tolerated the procedure well and  remained hemodynamically stable throughout. No complications were encountered and no significant blood loss encountered. IMPRESSION: Status post ultrasound-guided left renal biopsy, for medical renal purpose. Signed, Dulcy Fanny. Dellia Nims, RPVI [facility] Electronically Signed   By: Corrie Mckusick D.O.    On: 02/01/2020 11:00    ASSESSMENT:  1.  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 Calton Dach -Dr. Joylene Grapes at [facility]-SPEP 0.2 g, immunofixation with biclonal IgG with lambda specificity. -Free kappa light chain 16.7, lambda light chains 122.7, ratio 0.14. -Kidney biopsy on 02/04/2020 with AL amyloidosis, lambda immunophenotype, Congo red stain positive.  Immunofluorescence microscopy shows dense homogeneous glomerular mesangial staining with antisera specific for IgG 1-2+, IgM 2+, C1q 2+, lambda light chains 3+.  Waxy homogeneous staining noted along the blood vessels.  Congo red stain demonstrates positive apocrine birefringence within the glomerular mesangial regions and along blood vessel walls. -48 pound weight loss since summer 2021 with decreased appetite.  Positive for fatigue. -Reports occasional nausea, no vomiting.  Positive for occipital headaches. -Denies any tingling or numbness in the extremities.  He has cramps in the left arm which is new.  He had leg cramps for a long time.  2.  Social/family history: -He works for [employer] and retired 5 years ago. Never smoker. -Maternal aunt had cancer in male organs.  One maternal first cousin had appendiceal cancer, another maternal first cousin had cancer of the digestive system.  PLAN:  1.  AL amyloidosis, lambda immunophenotype: -We discussed the new diagnosis of immunoglobulin lambda light chain AL amyloidosis. -Affected people may have amyloidosis alone or in combination with other plasma cell dyscrasia like multiple myeloma or Waldenstrm's. -Physical examination-edge of liver palpable. -No neuropathy symptoms.  No symptoms of restrictive cardiomyopathy.  No macroglossia.  No skin purpura noted.  No bleeding diathesis reported. -We will check PT, PTT and factor X levels along with LDH and beta-2 microglobulin.  We will also check uric acid.  We will  repeat SPEP/immunofixation is initial report was resulted as biclonal.  We will also repeat free light chain assay. -Recommend NT proBNP and troponin T levels. -24-hour urine for total protein, UPEP, urine immunofixation and free light chains as baseline. -Cardiac MRI to evaluate for heart involvement. -Recommend bone marrow aspiration and biopsy. -Would hold off on abdominal fat pad biopsy at this time. -Recommend whole-body PET CT scan.  2.  Nausea: -He has intermittent nausea with no vomiting. -He had 48 pound weight loss due to decreased appetite. -We will start him on Compazine 10 mg every 6 hours as needed while awaiting further work-up.  3.  Posterior headaches: -He has history of Mnire's disease in the past. -But the headaches now are constant without dizziness. -We will give her tramadol prescription as needed while we are completing the work-up.   All questions were answered. The patient knows to call the clinic with any problems, questions or concerns.   Derek Jack, MD 02/21/20 2:54 PM  [place] [phone]   I, Milinda Antis, am acting as a scribe for Dr. Sanda Linger.  I, Derek Jack MD, have reviewed the above documentation for accuracy and completeness, and I agree with the above.

## 2020-02-21 NOTE — Patient Instructions (Signed)
[place] at [facility] Discharge Instructions  You were seen and examined today by Dr. Delton Coombes. Dr. Delton Coombes discussed your past medical history, family history of cancer and the events that led to you being here today.  You have been diagnosed with Amyloidosis, which is similar to the blood cancer Multiple Myeloma, but instead of impacting the blood, it impacts your organs. Amyloidosis is fairly rare. It can impact any organs including your kidneys, heart, bowels, your peripheral nerves (causing numbness and tingling), and your bone marrow. Amyloidosis is treatable, though it is often not curable, it is most often fatal when it involves the heart, but you do not present with any of the symptoms that would be associated with that.  Dr. Delton Coombes has recommended an extensive work-up that will include a cardiac MRI, a PET scan (specializes CT scan that illuminates cancer present in your body) as well as additional lab testing. You will also need a bone marrow biopsy. Dr. Delton Coombes discussed the potential recommendation for an abdominal fat pad biopsy, if suggested to be needed by the PET scan. We will also repeat the 24-hour urine testing, which will look beyond the protein that you are spilling through your kidneys.  We will see you back in the office following your bone marrow biopsy, MRI and PET scan to discuss the extent of your disease and treatment options.    Thank you for choosing [place] at [facility] to provide your oncology and hematology care.  To afford each patient quality time with our provider, please arrive at least 15 minutes before your scheduled appointment time.   If you have a lab appointment with the [facility] please come in thru the Main Entrance and check in at the main information desk.  You need to re-schedule your appointment should you arrive 10 or more minutes late.  We strive to give you quality time with  our providers, and arriving late affects you and other patients whose appointments are after yours.  Also, if you no show three or more times for appointments you may be dismissed from the clinic at the providers discretion.     Again, thank you for choosing [facility].  Our hope is that these requests will decrease the amount of time that you wait before being seen by our physicians.       _____________________________________________________________  Should you have questions after your visit to [facility], please contact our office at [phone] and follow the prompts.  Our office hours are 8:00 a.m. and 4:30 p.m. Monday - Friday.  Please note that voicemails left after 4:00 p.m. may not be returned until the following business day.  We are closed weekends and major holidays.  You do have access to a nurse 24-7, just call the main number to the clinic [phone] and do not press any options, hold on the line and a nurse will answer the phone.    For prescription refill requests, have your pharmacy contact our office and allow 72 hours.    Due to Covid, you will need to wear a mask upon entering the hospital. If you do not have a mask, a mask will be given to you at the Main Entrance upon arrival. For doctor visits, patients may have 1 support person age 26 or older with them. For treatment visits, patients can not have anyone with them due to social distancing guidelines and our immunocompromised population.

## 2020-02-22 ENCOUNTER — Telehealth: Payer: Self-pay | Admitting: *Deleted

## 2020-02-22 ENCOUNTER — Encounter (HOSPITAL_COMMUNITY): Payer: Self-pay | Admitting: Hematology

## 2020-02-22 DIAGNOSIS — E8581 Light chain (AL) amyloidosis: Secondary | ICD-10-CM | POA: Insufficient documentation

## 2020-02-22 LAB — FACTOR 10 ASSAY: Factor X Activity: 69 % — ABNORMAL LOW (ref 76–183)

## 2020-02-22 LAB — BETA 2 MICROGLOBULIN, SERUM: Beta-2 Microglobulin: 4.5 mg/L — ABNORMAL HIGH (ref 0.6–2.4)

## 2020-02-22 NOTE — Telephone Encounter (Signed)
Left message for patient to call and discuss the Cardiac MRI appointment scheduled 03/21/20 at 9:00 am at [facility]----arrival time is 8:30 am---1st floor admissions office.

## 2020-02-22 NOTE — Telephone Encounter (Signed)
Called and spoke with patient regarding new appointment date and time for the Cardiac MRI ordered by [facility]----Friday 02/29/20 at 7:00 am at [facility]----arrival time is 6:30 am 1st floor admissions office.  Patient voiced his understanding.

## 2020-02-25 ENCOUNTER — Ambulatory Visit (HOSPITAL_COMMUNITY)
Admission: RE | Admit: 2020-02-25 | Discharge: 2020-02-25 | Disposition: A | Discharge status: Home / Self Care | Payer: Medicare Other | Source: Ambulatory Visit | Attending: Hematology | Admitting: Hematology

## 2020-02-25 ENCOUNTER — Other Ambulatory Visit: Payer: Self-pay

## 2020-02-25 DIAGNOSIS — E8581 Light chain (AL) amyloidosis: Secondary | ICD-10-CM | POA: Diagnosis present

## 2020-02-25 DIAGNOSIS — I724 Aneurysm of artery of lower extremity: Secondary | ICD-10-CM | POA: Insufficient documentation

## 2020-02-25 MED ORDER — FLUDEOXYGLUCOSE F - 18 (FDG) INJECTION
13.3000 | Freq: Once | INTRAVENOUS | Status: AC | PRN
  Administered 2020-02-25: 13.3 via INTRAVENOUS

## 2020-02-26 ENCOUNTER — Inpatient Hospital Stay (HOSPITAL_BASED_OUTPATIENT_CLINIC_OR_DEPARTMENT_OTHER): Payer: Medicare Other | Admitting: Hematology

## 2020-02-26 ENCOUNTER — Telehealth (HOSPITAL_COMMUNITY): Payer: Self-pay | Admitting: Emergency Medicine

## 2020-02-26 ENCOUNTER — Other Ambulatory Visit: Payer: Self-pay

## 2020-02-26 VITALS — BP 143/87 | HR 78 | Temp 98.2°F | Resp 16

## 2020-02-26 DIAGNOSIS — E8581 Light chain (AL) amyloidosis: Secondary | ICD-10-CM

## 2020-02-26 LAB — CBC WITH DIFFERENTIAL/PLATELET
Abs Immature Granulocytes: 0.09 10*3/uL — ABNORMAL HIGH (ref 0.00–0.07)
Basophils Absolute: 0.1 10*3/uL (ref 0.0–0.1)
Basophils Relative: 1 %
Eosinophils Absolute: 0.8 10*3/uL — ABNORMAL HIGH (ref 0.0–0.5)
Eosinophils Relative: 6 %
HCT: 46.5 % (ref 39.0–52.0)
Hemoglobin: 16.2 g/dL (ref 13.0–17.0)
Immature Granulocytes: 1 %
Lymphocytes Relative: 21 %
Lymphs Abs: 2.8 10*3/uL (ref 0.7–4.0)
MCH: 32.2 pg (ref 26.0–34.0)
MCHC: 34.8 g/dL (ref 30.0–36.0)
MCV: 92.4 fL (ref 80.0–100.0)
Monocytes Absolute: 0.9 10*3/uL (ref 0.1–1.0)
Monocytes Relative: 7 %
Neutro Abs: 9.1 10*3/uL — ABNORMAL HIGH (ref 1.7–7.7)
Neutrophils Relative %: 64 %
Platelets: 226 10*3/uL (ref 150–400)
RBC: 5.03 MIL/uL (ref 4.22–5.81)
RDW: 17.2 % — ABNORMAL HIGH (ref 11.5–15.5)
WBC: 13.8 10*3/uL — ABNORMAL HIGH (ref 4.0–10.5)
nRBC: 0 % (ref 0.0–0.2)

## 2020-02-26 NOTE — Progress Notes (Signed)
Patient here today for bone marrow biopsy. Procedure explained and consent signed by all parties at 0751. Patient placed in prone position with both arms above head. Time out conducted at 0756 all parties agreed.

## 2020-02-26 NOTE — Progress Notes (Signed)
INDICATION:    Bone Marrow Biopsy and Aspiration Procedure Note   The patient was identified by name and date of birth, prior to start of the procedure and a timeout was performed.   An informed consent was obtained after discussing potential risks including bleeding, infection and pain.  The left posterior iliac crest was palpated, cleaned with ChloraPrep, and drapes applied.  1% lidocaine is infiltrated into the skin, subcutaneous tissue and periosteum.  Bone marrow was aspirated and smears made.  With the help of Jamshidi needle a core biopsy was obtained.  Pressure was applied to the biopsy site and bandage was placed over the biopsy site. Patient was made to lie on the back for 15 mins prior to discharge.  The procedure was tolerated well. COMPLICATIONS: None BLOOD LOSS: none Patient was discharged home in stable condition to return in 2 weeks to review results.  Patient was provided with post bone marrow biopsy instructions and instructed to call if there was any bleeding or worsening pain.  Specimens sent for flow cytometry, cytogenetics and additional studies.  Signed Deylan Canterbury, MD   

## 2020-02-26 NOTE — Progress Notes (Signed)
Patient here today for bone marrow biopsy. Procedure started at 0759. Patient tolerated procedure well with minimal pain and discomfort. Specimens collected and labeled appropriately. Procedure completed at [phone]. Dressing applied and patient reposition on back, sitting up, resting at 0814. Specimens taken to lab for processing. Patient stable and discharged home [phone].

## 2020-02-26 NOTE — Telephone Encounter (Signed)
Reaching out to patient to offer assistance regarding upcoming cardiac imaging study; pt verbalizes understanding of appt date/time, parking situation and where to check in, and verified current allergies; name and call back number provided for further questions should they arise Marchia Bond RN Navigator Cardiac Imaging [facility] [phone] office [phone] cell  Denies claustro, denies implants

## 2020-02-26 NOTE — Telephone Encounter (Signed)
error 

## 2020-02-27 ENCOUNTER — Inpatient Hospital Stay (HOSPITAL_COMMUNITY): Payer: Medicare Other

## 2020-02-27 ENCOUNTER — Other Ambulatory Visit: Payer: Self-pay

## 2020-02-27 DIAGNOSIS — E8581 Light chain (AL) amyloidosis: Secondary | ICD-10-CM

## 2020-02-27 LAB — PROTEIN ELECTROPHORESIS, SERUM
A/G Ratio: 0.8 (ref 0.7–1.7)
Albumin ELP: 2.2 g/dL — ABNORMAL LOW (ref 2.9–4.4)
Alpha-1-Globulin: 0.3 g/dL (ref 0.0–0.4)
Alpha-2-Globulin: 1.4 g/dL — ABNORMAL HIGH (ref 0.4–1.0)
Beta Globulin: 0.7 g/dL (ref 0.7–1.3)
Gamma Globulin: 0.6 g/dL (ref 0.4–1.8)
Globulin, Total: 2.9 g/dL (ref 2.2–3.9)
M-Spike, %: 0.1 g/dL — ABNORMAL HIGH
Total Protein ELP: 5.1 g/dL — ABNORMAL LOW (ref 6.0–8.5)

## 2020-02-27 LAB — BRAIN NATRIURETIC PEPTIDE: B Natriuretic Peptide: 172 pg/mL — ABNORMAL HIGH (ref 0.0–100.0)

## 2020-02-28 LAB — UPEP/UIFE/LIGHT CHAINS/TP, 24-HR UR
% BETA, Urine: 8.3 %
ALPHA 1 URINE: 8.2 %
Albumin, U: 72 %
Alpha 2, Urine: 7.1 %
Free Kappa Lt Chains,Ur: 94.65 mg/L (ref 0.63–113.79)
Free Kappa/Lambda Ratio: 0.1 — ABNORMAL LOW (ref 1.03–31.76)
Free Lambda Lt Chains,Ur: 941.55 mg/L — ABNORMAL HIGH (ref 0.47–11.77)
GAMMA GLOBULIN URINE: 4.4 %
M-SPIKE %, Urine: 2.1 % — ABNORMAL HIGH
M-Spike, Mg/24 Hr: 462 mg/24 hr — ABNORMAL HIGH
Total Protein, Urine-Ur/day: 22001 mg/24 hr — ABNORMAL HIGH (ref 30–150)
Total Protein, Urine: 1142.9 mg/dL
Total Volume: 1925

## 2020-02-29 ENCOUNTER — Other Ambulatory Visit: Payer: Self-pay

## 2020-02-29 ENCOUNTER — Ambulatory Visit (HOSPITAL_COMMUNITY)
Admission: RE | Admit: 2020-02-29 | Discharge: 2020-02-29 | Disposition: A | Discharge status: Home / Self Care | Payer: Medicare Other | Source: Ambulatory Visit | Attending: Hematology | Admitting: Hematology

## 2020-02-29 DIAGNOSIS — E8581 Light chain (AL) amyloidosis: Secondary | ICD-10-CM | POA: Diagnosis present

## 2020-02-29 LAB — KAPPA/LAMBDA LIGHT CHAINS

## 2020-02-29 LAB — SURGICAL PATHOLOGY

## 2020-02-29 LAB — TROPONIN T: Troponin T (Highly Sensitive): 19 ng/L (ref 0–22)

## 2020-02-29 MED ORDER — GADOBUTROL 1 MMOL/ML IV SOLN
10.0000 mL | Freq: Once | INTRAVENOUS | Status: AC | PRN
  Administered 2020-02-29: 10 mL via INTRAVENOUS

## 2020-03-02 LAB — IMMUNOFIXATION ELECTROPHORESIS
IgA: 90 mg/dL (ref 61–437)
IgG (Immunoglobin G), Serum: 566 mg/dL — ABNORMAL LOW (ref 603–1613)
IgM (Immunoglobulin M), Srm: 178 mg/dL — ABNORMAL HIGH (ref 20–172)
Total Protein ELP: 5 g/dL — ABNORMAL LOW (ref 6.0–8.5)

## 2020-03-03 ENCOUNTER — Encounter (HOSPITAL_COMMUNITY): Payer: Self-pay

## 2020-03-04 ENCOUNTER — Encounter (HOSPITAL_COMMUNITY): Payer: Self-pay | Admitting: Hematology

## 2020-03-06 ENCOUNTER — Other Ambulatory Visit: Payer: Self-pay

## 2020-03-06 ENCOUNTER — Inpatient Hospital Stay (HOSPITAL_COMMUNITY): Payer: Medicare Other | Attending: Hematology | Admitting: Hematology

## 2020-03-06 ENCOUNTER — Other Ambulatory Visit (HOSPITAL_COMMUNITY): Payer: Self-pay | Admitting: *Deleted

## 2020-03-06 ENCOUNTER — Inpatient Hospital Stay (HOSPITAL_COMMUNITY): Payer: Medicare Other

## 2020-03-06 ENCOUNTER — Encounter (HOSPITAL_COMMUNITY): Payer: Self-pay | Admitting: Hematology

## 2020-03-06 VITALS — BP 162/101 | HR 79 | Resp 16 | Wt 196.0 lb

## 2020-03-06 DIAGNOSIS — C9000 Multiple myeloma not having achieved remission: Secondary | ICD-10-CM | POA: Diagnosis not present

## 2020-03-06 DIAGNOSIS — Z79899 Other long term (current) drug therapy: Secondary | ICD-10-CM | POA: Insufficient documentation

## 2020-03-06 DIAGNOSIS — G4700 Insomnia, unspecified: Secondary | ICD-10-CM | POA: Diagnosis not present

## 2020-03-06 DIAGNOSIS — I739 Peripheral vascular disease, unspecified: Secondary | ICD-10-CM | POA: Diagnosis not present

## 2020-03-06 DIAGNOSIS — Z7982 Long term (current) use of aspirin: Secondary | ICD-10-CM | POA: Diagnosis not present

## 2020-03-06 DIAGNOSIS — Z8673 Personal history of transient ischemic attack (TIA), and cerebral infarction without residual deficits: Secondary | ICD-10-CM | POA: Insufficient documentation

## 2020-03-06 DIAGNOSIS — R809 Proteinuria, unspecified: Secondary | ICD-10-CM | POA: Insufficient documentation

## 2020-03-06 DIAGNOSIS — N189 Chronic kidney disease, unspecified: Secondary | ICD-10-CM | POA: Insufficient documentation

## 2020-03-06 DIAGNOSIS — I700 Atherosclerosis of aorta: Secondary | ICD-10-CM | POA: Insufficient documentation

## 2020-03-06 DIAGNOSIS — K219 Gastro-esophageal reflux disease without esophagitis: Secondary | ICD-10-CM | POA: Insufficient documentation

## 2020-03-06 DIAGNOSIS — R110 Nausea: Secondary | ICD-10-CM | POA: Diagnosis not present

## 2020-03-06 DIAGNOSIS — Z7189 Other specified counseling: Secondary | ICD-10-CM | POA: Diagnosis not present

## 2020-03-06 DIAGNOSIS — I2510 Atherosclerotic heart disease of native coronary artery without angina pectoris: Secondary | ICD-10-CM | POA: Diagnosis not present

## 2020-03-06 DIAGNOSIS — N179 Acute kidney failure, unspecified: Secondary | ICD-10-CM | POA: Insufficient documentation

## 2020-03-06 DIAGNOSIS — R519 Headache, unspecified: Secondary | ICD-10-CM | POA: Diagnosis not present

## 2020-03-06 DIAGNOSIS — E785 Hyperlipidemia, unspecified: Secondary | ICD-10-CM | POA: Insufficient documentation

## 2020-03-06 DIAGNOSIS — I129 Hypertensive chronic kidney disease with stage 1 through stage 4 chronic kidney disease, or unspecified chronic kidney disease: Secondary | ICD-10-CM | POA: Insufficient documentation

## 2020-03-06 DIAGNOSIS — Z5111 Encounter for antineoplastic chemotherapy: Secondary | ICD-10-CM | POA: Diagnosis present

## 2020-03-06 DIAGNOSIS — E8581 Light chain (AL) amyloidosis: Secondary | ICD-10-CM | POA: Insufficient documentation

## 2020-03-06 DIAGNOSIS — R634 Abnormal weight loss: Secondary | ICD-10-CM | POA: Insufficient documentation

## 2020-03-06 DIAGNOSIS — I252 Old myocardial infarction: Secondary | ICD-10-CM | POA: Diagnosis not present

## 2020-03-06 LAB — COMPREHENSIVE METABOLIC PANEL
ALT: 11 U/L (ref 0–44)
AST: 19 U/L (ref 15–41)
Albumin: 2.5 g/dL — ABNORMAL LOW (ref 3.5–5.0)
Alkaline Phosphatase: 144 U/L — ABNORMAL HIGH (ref 38–126)
Anion gap: 15 (ref 5–15)
BUN: 20 mg/dL (ref 8–23)
CO2: 19 mmol/L — ABNORMAL LOW (ref 22–32)
Calcium: 8 mg/dL — ABNORMAL LOW (ref 8.9–10.3)
Chloride: 102 mmol/L (ref 98–111)
Creatinine, Ser: 1.63 mg/dL — ABNORMAL HIGH (ref 0.61–1.24)
GFR, Estimated: 45 mL/min — ABNORMAL LOW (ref >60)
Glucose, Bld: 95 mg/dL (ref 70–99)
Potassium: 4.7 mmol/L (ref 3.5–5.1)
Sodium: 136 mmol/L (ref 135–145)
Total Bilirubin: 0.8 mg/dL (ref 0.3–1.2)
Total Protein: 5.8 g/dL — ABNORMAL LOW (ref 6.5–8.1)

## 2020-03-06 MED ORDER — PROCHLORPERAZINE MALEATE 10 MG PO TABS: 10.0000 mg | ORAL_TABLET | Freq: Four times a day (QID) | ORAL | 2 refills | Status: DC | PRN

## 2020-03-06 MED ORDER — TEMAZEPAM 15 MG PO CAPS: 15.0000 mg | ORAL_CAPSULE | Freq: Every evening | ORAL | 1 refills | Status: DC | PRN

## 2020-03-06 MED ORDER — HYDROCODONE-ACETAMINOPHEN 5-325 MG PO TABS: 1.0000 | ORAL_TABLET | Freq: Three times a day (TID) | ORAL | 0 refills | Status: DC | PRN

## 2020-03-06 NOTE — Patient Instructions (Signed)
Julian Miranda at [facility] Discharge Instructions  You were seen today by Dr. Delton Coombes. 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 Treatment will consist of Velcade and Darzalex Faspro injections given weekly and tablets called Revlimid taken 2 weeks on with 1 week off and Decadron taken weekly. You will be referred to the [facility] transplant center for an auto-transplant once you have started treatments. Dr. Delton Coombes will see you back in 1-2 weeks for labs and follow up.   Thank you for choosing [place] at [facility] to provide your oncology and hematology care.  To afford each patient quality time with our provider, please arrive at least 15 minutes before your scheduled appointment time.   If you have a lab appointment with the [facility] please come in thru the Main Entrance and check in at the main information desk  You need to re-schedule your appointment should you arrive 10 or more minutes late.  We strive to give you quality time with our providers, and arriving late affects you and other patients whose appointments are after yours.  Also, if you no show three or more times for appointments you may be dismissed from the clinic at the providers discretion.     Again, thank you for choosing [facility].  Our hope is that these requests will decrease the amount of time that you wait before being seen by our physicians.       _____________________________________________________________  Should you have questions after your visit to [facility], please contact our office at [phone] between the hours of 8:00 a.m. and 4:30 p.m.  Voicemails left after 4:00 p.m. will not be returned until the following business day.  For prescription refill requests,  have your pharmacy contact our office and allow 72 hours.    Cancer Center Support Programs:   > Cancer Support Group  2nd Tuesday of the month 1pm-2pm, Journey Room

## 2020-03-06 NOTE — Progress Notes (Signed)
START ON PATHWAY REGIMEN - Multiple Myeloma and Other Plasma Cell Dyscrasias     A cycle is every 21 days:     Bortezomib      Lenalidomide      Dexamethasone   **Always confirm dose/schedule in your pharmacy ordering system**  Patient Characteristics: Multiple Myeloma, Newly Diagnosed, Transplant Eligible, Standard Risk Disease Classification: Multiple Myeloma R-ISS Staging: II Therapeutic Status: Newly Diagnosed Is Patient Eligible for Transplant<= Transplant Eligible Risk Status: Standard Risk Intent of Therapy: Non-Curative / Palliative Intent, Discussed with Patient 

## 2020-03-06 NOTE — Progress Notes (Signed)
[address], [place] [address]   CLINIC:  Medical Oncology/Hematology  PCP:  Lonia Mad, MD No address on file  None  REASON FOR VISIT:  Follow-up for multiple myeloma  PRIOR THERAPY: None  CURRENT THERAPY: Under work-up  INTERVAL HISTORY:  Julian Miranda, a 68 y.o. male, returns for routine follow-up for his multiple myeloma. Julian Miranda was last seen on 02/21/2020.  Today he is accompanied by his wife and he reports feeling okay. He continues having nausea, posterior headaches, decreased energy and decreased appetite; he denies vomiting or diarrhea. He takes tramadol which helps alleviate the headache for 1 hour, but it then returns. He takes Compazine as needed.  He denies ever being diagnosed with heart failure or arrhythmias. He denies having diabetes.   REVIEW OF SYSTEMS:  Review of Systems  Constitutional: Positive for appetite change (50%) and fatigue (50%).  Respiratory: Positive for shortness of breath (w/ exertion).   Cardiovascular: Positive for leg swelling (R > L leg swelling).  Gastrointestinal: Positive for nausea. Negative for diarrhea and vomiting.  Musculoskeletal: Positive for myalgias (cramping in legs & L arm).  Neurological: Positive for dizziness and headaches (posterior headaches).  Psychiatric/Behavioral: Positive for sleep disturbance.  All other systems reviewed and are negative.   PAST MEDICAL/SURGICAL HISTORY:  Past Medical History:  Diagnosis Date  . CAD (coronary artery disease)   . GERD (gastroesophageal reflux disease)   . Hypertension   . Myocardial infarction ([place]) 1994  . Peripheral vascular disease ([place])   . Stroke [facility])    Past Surgical History:  Procedure Laterality Date  . BYPASS GRAFT POPLITEAL TO POPLITEAL Right 03/23/2018   Procedure: BYPASS GRAFT RIGHT ABOVE KNEE POPLITEAL TO BELOW KNEE POPLITEAL ARTERY  USING RIGHT GREAT SAPHENOUS VEIN;  Surgeon: Marty Heck, MD;   Location: [place];  Service: Vascular;  Laterality: Right;  . BYPASS GRAFT POPLITEAL TO POPLITEAL Left 04/09/2019   Procedure: BYPASS  ABOVE KNEE POPLITEAL TO BELOW KNEE POPLITEAL AND LIGATION OF LEFT POPITEAL ANEURYSM;  Surgeon: Marty Heck, MD;  Location: [place];  Service: Vascular;  Laterality: Left;  . COLONOSCOPY WITH ESOPHAGOGASTRODUODENOSCOPY (EGD)    . CORONARY ARTERY BYPASS GRAFT    . EYE SURGERY Bilateral    cataracts  . FEMORAL BYPASS Right 03/23/2018  . HERNIA REPAIR    . MASTOIDECTOMY    . ROTATOR CUFF REPAIR Right   . TONSILLECTOMY    . VEIN HARVEST Right 03/23/2018   Procedure: VEIN HARVEST RIGHT GREAT SAPHENOUS;  Surgeon: Marty Heck, MD;  Location: [place];  Service: Vascular;  Laterality: Right;  . VEIN HARVEST Left 04/09/2019   Procedure: Harvest Greater Saphenous Vein and  Revise Elisa Lateral Saphenous Vein ;  Surgeon: Marty Heck, MD;  Location: [place];  Service: Vascular;  Laterality: Left;    SOCIAL HISTORY:  Social History   Socioeconomic History  . Marital status: Married    Spouse name: Not on file  . Number of children: Not on file  . Years of education: Not on file  . Highest education level: Not on file  Occupational History  . Not on file  Tobacco Use  . Smoking status: Never Smoker  . Smokeless tobacco: Never Used  Vaping Use  . Vaping Use: Never used  Substance and Sexual Activity  . Alcohol use: Never  . Drug use: Never  . Sexual activity: Not on file  Other Topics Concern  . Not on file  Social History Narrative  . Not on file   Social Determinants of Health   Financial Resource Strain: Low Risk   . Difficulty of Paying Living Expenses: Not hard at all  Food Insecurity: No Food Insecurity  . Worried About Charity fundraiser in the Last Year: Never true  . Ran Out of Food in the Last Year: Never true  Transportation Needs: No Transportation Needs  . Lack of Transportation (Medical): No  . Lack of Transportation  (Non-Medical): No  Physical Activity: Insufficiently Active  . Days of Exercise per Week: 2 days  . Minutes of Exercise per Session: 20 min  Stress: Stress Concern Present  . Feeling of Stress : To some extent  Social Connections: Moderately Integrated  . Frequency of Communication with Friends and Family: More than three times a week  . Frequency of Social Gatherings with Friends and Family: Twice a week  . Attends Religious Services: More than 4 times per year  . Active Member of Clubs or Organizations: No  . Attends Archivist Meetings: Never  . Marital Status: Married  Human resources officer Violence: Not At Risk  . Fear of Current or Ex-Partner: No  . Emotionally Abused: No  . Physically Abused: No  . Sexually Abused: No    FAMILY HISTORY:  Family History  Problem Relation Age of Onset  . Heart disease Mother     CURRENT MEDICATIONS:  Current Outpatient Medications  Medication Sig Dispense Refill  . acetaminophen (TYLENOL) 500 MG tablet Take 500 mg by mouth 2 (two) times daily.     Marland Kitchen ALPRAZolam (XANAX) 0.25 MG tablet Take 0.25 mg by mouth in the morning, at noon, in the evening, and at bedtime.     Marland Kitchen aspirin 81 MG tablet Take 81 mg by mouth daily.    Marland Kitchen atorvastatin (LIPITOR) 80 MG tablet Take 80 mg by mouth at bedtime.     . carvedilol (COREG) 25 MG tablet Take 25 mg by mouth daily.     . diazepam (VALIUM) 2 MG tablet Take 2 mg by mouth daily.    . furosemide (LASIX) 20 MG tablet Take 20 mg by mouth every Monday, Wednesday, and Friday.     . icosapent Ethyl (VASCEPA) 1 g capsule Take 1 g by mouth 2 (two) times daily.    . Iron-Vitamin C (VITRON-C) 65-125 MG TABS Take 1 tablet by mouth daily.    Marland Kitchen lisinopril (ZESTRIL) 20 MG tablet Take 20 mg by mouth in the morning and at bedtime.     . meclizine (ANTIVERT) 25 MG tablet Take 25 mg by mouth 2 (two) times daily as needed for dizziness.     . melatonin 5 MG TABS Take 10 mg by mouth at bedtime.     . Multiple Minerals  (CALCIUM/MAGNESIUM/ZINC PO) Take 1 tablet by mouth at bedtime.    Marland Kitchen omeprazole (PRILOSEC) 20 MG capsule Take 20 mg by mouth 2 (two) times daily.     . potassium chloride (KLOR-CON) 10 MEQ tablet Take 10 mEq by mouth 2 (two) times daily.     . traMADol (ULTRAM) 50 MG tablet Take 1 tablet (50 mg total) by mouth every 8 (eight) hours as needed. 60 tablet 0  . vitamin B-12 (CYANOCOBALAMIN) 1000 MCG tablet Take 1,000 mcg by mouth daily.    Marland Kitchen zinc gluconate 50 MG tablet Take 50 mg by mouth daily.     . prochlorperazine (COMPAZINE) 10 MG tablet Take 1 tablet (10 mg total) by  mouth every 6 (six) hours as needed for nausea or vomiting. 60 tablet 2   No current facility-administered medications for this visit.    ALLERGIES:  Allergies  Allergen Reactions  . Other   . Vancomycin     Other reaction(s): Red Man Syndrome Noted intraoperatively on 10/04/2018. No associated hemodynamic or respiratory changes. Please give slowly.    PHYSICAL EXAM:  Performance status (ECOG): 1 - Symptomatic but completely ambulatory  Vitals:   03/06/20 1232  BP: (!) 162/101  Pulse: 79  Resp: 16  SpO2: 97%   Wt Readings from Last 3 Encounters:  03/06/20 196 lb (88.9 kg)  02/21/20 200 lb 8 oz (90.9 kg)  02/01/20 204 lb (92.5 kg)   Physical Exam Vitals reviewed.  Constitutional:      Appearance: Normal appearance.  Cardiovascular:     Rate and Rhythm: Normal rate and regular rhythm.     Pulses: Normal pulses.     Heart sounds: Murmur (mild systolic ejection) heard.   Pulmonary:     Effort: Pulmonary effort is normal.     Breath sounds: Normal breath sounds.  Musculoskeletal:     Right lower leg: Edema (1+) present.     Left lower leg: Edema (trace) present.  Neurological:     General: No focal deficit present.     Mental Status: He is alert and oriented to person, place, and time.  Psychiatric:        Mood and Affect: Mood normal.        Behavior: Behavior normal.     LABORATORY DATA:  I have  reviewed the labs as listed.  CBC Latest Ref Rng & Units 02/26/2020 02/21/2020 02/01/2020  WBC 4.0 - 10.5 K/uL 13.8(H) 13.6(H) 15.5(H)  Hemoglobin 13.0 - 17.0 g/dL 16.2 17.2(H) 16.6  Hematocrit 39 - 52 % 46.5 52.0 50.1  Platelets 150 - 400 K/uL 226 155 191   CMP Latest Ref Rng & Units 02/21/2020 04/10/2019 04/09/2019  Glucose 70 - 99 mg/dL 101(H) 130(H) -  BUN 8 - 23 mg/dL 24(H) 16 -  Creatinine 0.61 - 1.24 mg/dL 1.37(H) 0.94 1.00  Sodium 135 - 145 mmol/L 135 136 -  Potassium 3.5 - 5.1 mmol/L 4.5 4.5 -  Chloride 98 - 111 mmol/L 103 99 -  CO2 22 - 32 mmol/L 20(L) 27 -  Calcium 8.9 - 10.3 mg/dL 8.7(L) 8.8(L) -  Total Protein 6.5 - 8.1 g/dL 5.9(L) - -  Total Bilirubin 0.3 - 1.2 mg/dL 0.6 - -  Alkaline Phos 38 - 126 U/L 129(H) - -  AST 15 - 41 U/L 18 - -  ALT 0 - 44 U/L 12 - -      Component Value Date/Time   RBC 5.03 02/26/2020 0742   MCV 92.4 02/26/2020 0742   MCH 32.2 02/26/2020 0742   MCHC 34.8 02/26/2020 0742   RDW 17.2 (H) 02/26/2020 0742   LYMPHSABS 2.8 02/26/2020 0742   MONOABS 0.9 02/26/2020 0742   EOSABS 0.8 (H) 02/26/2020 0742   BASOSABS 0.1 02/26/2020 0742   Lab Results  Component Value Date   TOTALPROTELP 5.0 (L) 02/26/2020   TOTALPROTELP 5.1 (L) 02/26/2020   ALBUMINELP 2.2 (L) 02/26/2020   A1GS 0.3 02/26/2020   A2GS 1.4 (H) 02/26/2020   BETS 0.7 02/26/2020   GAMS 0.6 02/26/2020   MSPIKE 0.1 (H) 02/26/2020   SPEI Comment 02/26/2020   Lab Results  Component Value Date   KPAFRELGTCHN LIPG 02/26/2020   LAMBDASER NOT PERFORMED 02/26/2020  KAPLAMBRATIO 0.10 (L) 02/26/2020   Lab Results  Component Value Date   LDH 216 (H) 02/21/2020    Surgical pathology ([id number]) on 02/26/2020: Bone marrow aspirate and clot: slightly hypercellular bone marrow for age with plasmacytosis (23%).  DIAGNOSTIC IMAGING:  I have independently reviewed the scans and discussed with the patient. NM PET Image Initial (PI) Whole Body  Result Date: 02/26/2020 CLINICAL DATA:   Initial treatment strategy for renal amyloidosis. EXAM: NUCLEAR MEDICINE PET WHOLE BODY TECHNIQUE: 13.3 mCi F-18 FDG was injected intravenously. Full-ring PET imaging was performed from the head to foot after the radiotracer. CT data was obtained and used for attenuation correction and anatomic localization. Fasting blood glucose: 93 mg/dl COMPARISON:  CT scan 03/10/2018 FINDINGS: Mediastinal blood pool activity: SUV max 2.19 HEAD/NECK: No hypermetabolic activity in the scalp. No hypermetabolic cervical lymph nodes. Incidental CT findings: none CHEST: Small scattered mediastinal and hilar lymph nodes are mildly hypermetabolic. These are more likely inflammatory/reactive. Most of the nodes measure less than 8 mm. SUV max is 4.0. No worrisome pulmonary lesions. No chest wall mass, supraclavicular or axillary adenopathy. Incidental CT findings: Surgical changes from bypass surgery. Aortic and coronary artery calcifications are noted. ABDOMEN/PELVIS: No abnormal hypermetabolic activity within the liver, pancreas, adrenal glands, or spleen. No hypermetabolic lymph nodes in the abdomen or pelvis. Incidental CT findings: Severe/advanced atherosclerotic calcifications involving the aorta, iliac arteries and branch vessels. No focal aneurysm. Borderline splenomegaly but no hypermetabolism or lesions. SKELETON: No significant osseous findings. Incidental CT findings: none EXTREMITIES: No abnormal hypermetabolic activity in the lower extremities. 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 This is unchanged since 2019. IMPRESSION: 1. Scattered borderline mediastinal and hilar lymph nodes with low level hypermetabolism. This is most likely inflammatory/reactive. A follow-up chest CT in 4-6 months may be helpful to reassess. 2. No significant findings in the abdomen/pelvis. 3. 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 Electronically Signed   By: Marijo Sanes M.D.   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 Evaluate for cardiac amyloidosis. EXAM: CARDIAC MRI TECHNIQUE: The patient was scanned on a 1.5 Tesla GE magnet. A dedicated cardiac coil was used. Functional imaging was done using Fiesta sequences. 2,3, and 4 chamber views were done to assess for RWMA's. Modified Simpson's rule using a short axis stack was used to calculate an ejection fraction on a dedicated work Conservation officer, nature. The patient received 9 cc of Gadavist. After 10 minutes inversion recovery sequences were used to assess for infiltration and scar tissue. CONTRAST:  9 cc  of Gadavist FINDINGS: 1. Mildly dilated left ventricle with moderate basal septal hypertrophy and mild hypertrophy of the remaining myocardium and moderately decreased systolic function (LVEF = 38%). There is akinesis of the entire inferior wall and hypokinesis of the basal inferoseptal wall. There is transmural (75-100%) late gadolinium enhancement in the basal, mid and apical inferior walls. ECV (Extracellular volume 27%). ECV (Extracellular volume 27%). LVEDD: 60 mm LVESD: 51 mm LVEDV: 205 ml LVESV: 128 ml SV: 78 ml CO: 6.1 L/min Myocardial mass: 171 g 2. Normal right ventricular size, thickness and systolic function (RVEF = 49%). There are no regional wall motion abnormalities. 3. Moderately dilated left atrium (47 mm). Mildly dilated right atrium. 4. Normal size of the aortic root, ascending aorta and pulmonary  artery. 5. Mild mitral and aortic regurgitation. Trivial tricuspid regurgitation. 6. Normal pericardium. No pericardial effusion. Pericardial fat pad. IMPRESSION: 1. Mildly dilated left ventricle with moderate basal septal hypertrophy and mild hypertrophy of the remaining myocardium and moderately decreased systolic function (LVEF = 38%). There is  akinesis of the entire inferior wall and hypokinesis of the basal inferoseptal wall. There is transmural (75-100%) late gadolinium enhancement in the basal, mid and apical inferior walls. ECV (Extracellular volume 27%). 2. Normal right ventricular size, thickness and systolic function (RVEF = 49%). There are no regional wall motion abnormalities. 3. Moderately dilated left atrium (47 mm). Mildly dilated right atrium. 4. Normal size of the aortic root, ascending aorta and pulmonary artery. 5. Mild mitral and aortic regurgitation. Trivial tricuspid regurgitation. 6. Normal pericardium. No pericardial effusion. Pericardial fat pad. These findings are consistent with ischemic cardiomyopathy with an old transmural scar in the inferior walls and moderate LV dysfunction. There is no evidence for amyloid deposits in the myocardium. Electronically Signed   By: Ena Dawley   On: 03/03/2020 17:09     ASSESSMENT:  1.  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 Calton Dach -Dr. Joylene Grapes at [facility]-SPEP 0.2 g, immunofixation with biclonal IgG with lambda specificity. -Free kappa light chain 16.7, lambda light chains 122.7, ratio 0.14. -Kidney biopsy on 02/04/2020 with AL amyloidosis, lambda immunophenotype, Congo red stain positive.  Immunofluorescence microscopy shows dense homogeneous glomerular mesangial staining with antisera specific for IgG 1-2+, IgM 2+, C1q 2+, lambda light chains 3+.  Waxy homogeneous staining noted along the blood vessels.  Congo red stain demonstrates positive apocrine birefringence within the glomerular mesangial regions and along blood vessel walls. -48 pound weight loss since summer 2021 with decreased appetite.  Positive for fatigue. -Reports occasional nausea, no vomiting.  Positive for occipital headaches. -Denies any tingling or numbness in the extremities.  He has cramps in the left arm  which is new.  He had leg cramps for a long time. -Cardiac MRI on 02/29/2020 with EF 38%, no evidence of amyloid deposits in the myocardium. -PET scan on 02/26/2020 with scattered borderline mediastinal and hilar lymph nodes with low-level hypermetabolism most likely inflammatory or reactive.  No bone abnormalities.  No findings in the abdomen or pelvis. -Bone marrow biopsy on 02/26/2020 with slightly hypercellular bone marrow with 23% cells consistent with plasma cells, staining for lambda light chain.  No lymphoproliferative disorder.  Congo red stain was negative. -Bone marrow cytogenetics were normal.  FISH panel for multiple myeloma was negative. -M spike was 0.1 g, immunofixation shows IgG lambda.  LDH was elevated at 216.  Beta-2 microglobulin 4.5.  2.  Social/family history: -He works for [employer] and retired 5 years ago. Never smoker. -Maternal aunt had cancer in male organs.  One maternal first cousin had appendiceal cancer, another maternal first cousin had cancer of the digestive system.   PLAN:  1.  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 -We also reviewed results of the bone marrow biopsy, FISH panel and chromosome analysis. -Results are consistent with IgG lambda multiple myeloma with AL amyloid deposition in the kidney. -Today his creatinine is 1.63 with a calcium of 8.0, albumin 2.5. -We talked about normal prognosis of multiple myeloma and treatment with induction combination regimen, followed by autologous stem cell transplant. -I will  use triple drug regimen containing Velcade, Revlimid and dexamethasone.  Revlimid will be dose adjusted to his renal function at 10 mg 2 weeks on/1 week off.  We will treat with weekly Velcade and dexamethasone initially 20-40 mg weekly as tolerated. -We discussed side effects in detail.  We will likely start his  treatments next week.  2.  Nausea: -Take Compazine 10 mg twice daily.  3.  Posterior headaches: -He has history of Mnire's disease in the past.  He has headaches in the posterior part of the head and neck. -We will give him hydrocodone 5/325 every 8 hours as needed.  4.  Sleeping difficulty: -We will start him on temazepam 15 mg at bedtime.  Orders placed this encounter:  Orders Placed This Encounter  Procedures  . Comprehensive metabolic panel  . Kappa/lambda light chains     Derek Jack, MD [place] [phone]   I, Milinda Antis, am acting as a scribe for Dr. Sanda Linger.  I, Derek Jack MD, have reviewed the above documentation for accuracy and completeness, and I agree with the above.

## 2020-03-07 LAB — KAPPA/LAMBDA LIGHT CHAINS

## 2020-03-10 ENCOUNTER — Other Ambulatory Visit (HOSPITAL_COMMUNITY): Payer: Medicare Other

## 2020-03-10 ENCOUNTER — Other Ambulatory Visit (HOSPITAL_COMMUNITY): Payer: Self-pay

## 2020-03-14 ENCOUNTER — Encounter (HOSPITAL_COMMUNITY): Payer: Self-pay

## 2020-03-14 ENCOUNTER — Other Ambulatory Visit (HOSPITAL_COMMUNITY): Payer: Self-pay

## 2020-03-14 DIAGNOSIS — E8581 Light chain (AL) amyloidosis: Secondary | ICD-10-CM

## 2020-03-14 DIAGNOSIS — C9000 Multiple myeloma not having achieved remission: Secondary | ICD-10-CM

## 2020-03-18 NOTE — Progress Notes (Signed)
.   Pharmacist Chemotherapy Monitoring - Initial Assessment    Anticipated start date: 03/20/20   Regimen:  . Are orders appropriate based on the patient's diagnosis, regimen, and cycle? Yes . Does the plan date match the patient's scheduled date? Yes . Is the sequencing of drugs appropriate? Yes . Are the premedications appropriate for the patient's regimen? Yes . Prior Authorization for treatment is: Approved o If applicable, is the correct biosimilar selected based on the patient's insurance? not applicable  Organ Function and Labs: Marland Kitchen Are dose adjustments needed based on the patient's renal function, hepatic function, or hematologic function? No . Are appropriate labs ordered prior to the start of patient's treatment? Yes . Other organ system assessment, if indicated: N/A . The following baseline labs, if indicated, have been ordered: N/A  Dose Assessment: . Are the drug doses appropriate? Yes . Are the following correct: o Drug concentrations Yes o IV fluid compatible with drug Yes o Administration routes Yes o Timing of therapy Yes . If applicable, does the patient have documented access for treatment and/or plans for port-a-cath placement? not applicable . If applicable, have lifetime cumulative doses been properly documented and assessed? not applicable Lifetime Dose Tracking  No doses have been documented on this patient for the following tracked chemicals: Doxorubicin, Epirubicin, Idarubicin, Daunorubicin, Mitoxantrone, Bleomycin, Oxaliplatin, Carboplatin, Liposomal Doxorubicin  o   Toxicity Monitoring/Prevention: . The patient has the following take home antiemetics prescribed: Prochlorperazine . The patient has the following take home medications prescribed: N/A . Medication allergies and previous infusion related reactions, if applicable, have been reviewed and addressed. Yes . The patient's current medication list has been assessed for drug-drug interactions with their  chemotherapy regimen. no significant drug-drug interactions were identified on review.  Order Review: . Are the treatment plan orders signed? Yes . Is the patient scheduled to see a provider prior to their treatment? Yes  I verify that I have reviewed each item in the above checklist and answered each question accordingly.  Wynona Neat 03/18/2020 3:00 PM

## 2020-03-19 NOTE — Progress Notes (Addendum)
Julian Miranda, Julian Miranda [id number]   CLINIC:  Medical Oncology/Hematology  PCP:  Lonia Mad, MD No address on file  None  REASON FOR VISIT:  Follow-up for multiple myeloma  PRIOR THERAPY: None  CURRENT THERAPY: Under work-up  INTERVAL HISTORY:   [name], a 69 y.o. male, returns for routine follow-up for his multiple myeloma.  Today he is accompanied by his wife and he reports feeling okay.  He continues having nausea, posterior headaches, decreased energy and decreased appetite; he denies vomiting or diarrhea.  He continues to lose weight. They [facility] who suggested first line dara/cyclo/velcade/dex per ANDROMEDA trial. He is hoping to start treatment today.  REVIEW OF SYSTEMS:  Review of Systems  Constitutional: Positive for appetite change (50%) and fatigue (50%).  Respiratory: Positive for shortness of breath (w/ exertion).   Cardiovascular: Positive for leg swelling (R > L leg swelling).  Gastrointestinal: Positive for nausea. Negative for diarrhea and vomiting.  Musculoskeletal: Positive for myalgias (cramping in legs & L arm).  Neurological: Positive for dizziness and headaches (posterior headaches).  Psychiatric/Behavioral: Positive for sleep disturbance.  All other systems reviewed and are negative.   PAST MEDICAL/SURGICAL HISTORY:  Past Medical History:  Diagnosis Date  . CAD (coronary artery disease)   . GERD (gastroesophageal reflux disease)   . Hypertension   . Myocardial infarction ([place]) 1994  . Peripheral vascular disease ([place])   . Stroke [facility])    Past Surgical History:  Procedure Laterality Date  . BYPASS GRAFT POPLITEAL TO POPLITEAL Right 03/23/2018   Procedure: BYPASS GRAFT RIGHT ABOVE KNEE POPLITEAL TO BELOW KNEE POPLITEAL ARTERY  USING RIGHT GREAT SAPHENOUS VEIN;  Surgeon: Marty Heck, MD;  Location: [place];  Service: Vascular;  Laterality: Right;  . BYPASS GRAFT POPLITEAL TO  POPLITEAL Left 04/09/2019   Procedure: BYPASS  ABOVE KNEE POPLITEAL TO BELOW KNEE POPLITEAL AND LIGATION OF LEFT POPITEAL ANEURYSM;  Surgeon: Marty Heck, MD;  Location: [place];  Service: Vascular;  Laterality: Left;  . COLONOSCOPY WITH ESOPHAGOGASTRODUODENOSCOPY (EGD)    . CORONARY ARTERY BYPASS GRAFT    . EYE SURGERY Bilateral    cataracts  . FEMORAL BYPASS Right 03/23/2018  . HERNIA REPAIR    . MASTOIDECTOMY    . ROTATOR CUFF REPAIR Right   . TONSILLECTOMY    . VEIN HARVEST Right 03/23/2018   Procedure: VEIN HARVEST RIGHT GREAT SAPHENOUS;  Surgeon: Marty Heck, MD;  Location: [place];  Service: Vascular;  Laterality: Right;  . VEIN HARVEST Left 04/09/2019   Procedure: Harvest Greater Saphenous Vein and  Revise Elisa Lateral Saphenous Vein ;  Surgeon: Marty Heck, MD;  Location: [place];  Service: Vascular;  Laterality: Left;    SOCIAL HISTORY:  Social History   Socioeconomic History  . Marital status: Married    Spouse name: Not on file  . Number of children: Not on file  . Years of education: Not on file  . Highest education level: Not on file  Occupational History  . Not on file  Tobacco Use  . Smoking status: Never Smoker  . Smokeless tobacco: Never Used  Vaping Use  . Vaping Use: Never used  Substance and Sexual Activity  . Alcohol use: Never  . Drug use: Never  . Sexual activity: Not on file  Other Topics Concern  . Not on file  Social History Narrative  . Not on file   Social Determinants of  Health   Financial Resource Strain: Low Risk   . Difficulty of Paying Living Expenses: Not hard at all  Food Insecurity: No Food Insecurity  . Worried About Charity fundraiser in the Last Year: Never true  . Ran Out of Food in the Last Year: Never true  Transportation Needs: No Transportation Needs  . Lack of Transportation (Medical): No  . Lack of Transportation (Non-Medical): No  Physical Activity: Insufficiently Active  . Days of Exercise per  Week: 2 days  . Minutes of Exercise per Session: 20 min  Stress: Stress Concern Present  . Feeling of Stress : To some extent  Social Connections: Moderately Integrated  . Frequency of Communication with Friends and Family: More than three times a week  . Frequency of Social Gatherings with Friends and Family: Twice a week  . Attends Religious Services: More than 4 times per year  . Active Member of Clubs or Organizations: No  . Attends Archivist Meetings: Never  . Marital Status: Married  Human resources officer Violence: Not At Risk  . Fear of Current or Ex-Partner: No  . Emotionally Abused: No  . Physically Abused: No  . Sexually Abused: No    FAMILY HISTORY:  Family History  Problem Relation Age of Onset  . Heart disease Mother     CURRENT MEDICATIONS:  Current Outpatient Medications  Medication Sig Dispense Refill  . acetaminophen (TYLENOL) 500 MG tablet Take 500 mg by mouth 2 (two) times daily.     Marland Kitchen ALPRAZolam (XANAX) 0.25 MG tablet Take 0.25 mg by mouth in the morning, at noon, in the evening, and at bedtime.     Marland Kitchen aspirin 81 MG tablet Take 81 mg by mouth daily.    Marland Kitchen atorvastatin (LIPITOR) 80 MG tablet Take 80 mg by mouth at bedtime.     . carvedilol (COREG) 25 MG tablet Take 25 mg by mouth daily.     . diazepam (VALIUM) 2 MG tablet Take 2 mg by mouth daily.    . furosemide (LASIX) 20 MG tablet Take 20 mg by mouth every Monday, Wednesday, and Friday.     Marland Kitchen HYDROcodone-acetaminophen (NORCO) 5-325 MG tablet Take 1 tablet by mouth every 8 (eight) hours as needed for moderate pain. 60 tablet 0  . icosapent Ethyl (VASCEPA) 1 g capsule Take 1 g by mouth 2 (two) times daily.    . Iron-Vitamin C (VITRON-C) 65-125 MG TABS Take 1 tablet by mouth daily.    Marland Kitchen lisinopril (ZESTRIL) 20 MG tablet Take 20 mg by mouth in the morning and at bedtime.     . meclizine (ANTIVERT) 25 MG tablet Take 25 mg by mouth 2 (two) times daily as needed for dizziness.     . melatonin 5 MG TABS Take  10 mg by mouth at bedtime.     . Multiple Minerals (CALCIUM/MAGNESIUM/ZINC PO) Take 1 tablet by mouth at bedtime.    Marland Kitchen omeprazole (PRILOSEC) 20 MG capsule Take 20 mg by mouth 2 (two) times daily.     . potassium chloride (KLOR-CON) 10 MEQ tablet Take 10 mEq by mouth 2 (two) times daily.     . prochlorperazine (COMPAZINE) 10 MG tablet Take 1 tablet (10 mg total) by mouth every 6 (six) hours as needed for nausea or vomiting. 60 tablet 2  . temazepam (RESTORIL) 15 MG capsule Take 1 capsule (15 mg total) by mouth at bedtime as needed for sleep. 30 capsule 1  . traMADol (ULTRAM) 50 MG tablet  Take 1 tablet (50 mg total) by mouth every 8 (eight) hours as needed. 60 tablet 0  . vitamin B-12 (CYANOCOBALAMIN) 1000 MCG tablet Take 1,000 mcg by mouth daily.    Marland Kitchen zinc gluconate 50 MG tablet Take 50 mg by mouth daily.      No current facility-administered medications for this visit.    ALLERGIES:  Allergies  Allergen Reactions  . Other   . Vancomycin     Other reaction(s): Red Man Syndrome Noted intraoperatively on 10/04/2018. No associated hemodynamic or respiratory changes. Please give slowly.    PHYSICAL EXAM:  Performance status (ECOG): 1 - Symptomatic but completely ambulatory  There were no vitals filed for this visit. Wt Readings from Last 3 Encounters:  03/06/20 196 lb (88.9 kg)  02/21/20 200 lb 8 oz (90.9 kg)  02/01/20 204 lb (92.5 kg)   Physical Exam Vitals reviewed.  Constitutional:      Appearance: Normal appearance.  HENT:     Mouth/Throat:     Comments: No glossomegaly Cardiovascular:     Rate and Rhythm: Normal rate and regular rhythm.     Pulses: Normal pulses.     Heart sounds: Murmur (mild systolic ejection) heard.    Pulmonary:     Effort: Pulmonary effort is normal.     Breath sounds: Normal breath sounds.  Abdominal:     General: Abdomen is flat. There is no distension.     Palpations: Abdomen is soft. There is no mass.  Musculoskeletal:     Cervical back:  Normal range of motion and neck supple.     Right lower leg: Edema (1+, right greater than left) present.     Left lower leg: Edema (trace) present.  Skin:    Findings: Bruising present.  Neurological:     General: No focal deficit present.     Mental Status: He is alert and oriented to person, place, and time.  Psychiatric:        Mood and Affect: Mood normal.        Behavior: Behavior normal.     LABORATORY DATA:  I have reviewed the labs as listed.  CBC Latest Ref Rng & Units 02/26/2020 02/21/2020 02/01/2020  WBC 4.0 - 10.5 K/uL 13.8(H) 13.6(H) 15.5(H)  Hemoglobin 13.0 - 17.0 g/dL 16.2 17.2(H) 16.6  Hematocrit 39.0 - 52.0 % 46.5 52.0 50.1  Platelets 150 - 400 K/uL 226 155 191   CMP Latest Ref Rng & Units 03/06/2020 02/21/2020 04/10/2019  Glucose 70 - 99 mg/dL 95 101(H) 130(H)  BUN 8 - 23 mg/dL 20 24(H) 16  Creatinine 0.61 - 1.24 mg/dL 1.63(H) 1.37(H) 0.94  Sodium 135 - 145 mmol/L 136 135 136  Potassium 3.5 - 5.1 mmol/L 4.7 4.5 4.5  Chloride 98 - 111 mmol/L 102 103 99  CO2 22 - 32 mmol/L 19(L) 20(L) 27  Calcium 8.9 - 10.3 mg/dL 8.0(L) 8.7(L) 8.8(L)  Total Protein 6.5 - 8.1 g/dL 5.8(L) 5.9(L) -  Total Bilirubin 0.3 - 1.2 mg/dL 0.8 0.6 -  Alkaline Phos 38 - 126 U/L 144(H) 129(H) -  AST 15 - 41 U/L 19 18 -  ALT 0 - 44 U/L 11 12 -      Component Value Date/Time   RBC 5.03 02/26/2020 0742   MCV 92.4 02/26/2020 0742   MCH 32.2 02/26/2020 0742   MCHC 34.8 02/26/2020 0742   RDW 17.2 (H) 02/26/2020 0742   LYMPHSABS 2.8 02/26/2020 0742   MONOABS 0.9 02/26/2020 0742   EOSABS 0.8 (  H) 02/26/2020 0742   BASOSABS 0.1 02/26/2020 0742   Lab Results  Component Value Date   TOTALPROTELP 5.0 (L) 02/26/2020   TOTALPROTELP 5.1 (L) 02/26/2020   ALBUMINELP 2.2 (L) 02/26/2020   A1GS 0.3 02/26/2020   A2GS 1.4 (H) 02/26/2020   BETS 0.7 02/26/2020   GAMS 0.6 02/26/2020   MSPIKE 0.1 (H) 02/26/2020   SPEI Comment 02/26/2020   Lab Results  Component Value Date   KPAFRELGTCHN LIPG  03/06/2020   LAMBDASER NOT PERFORMED 03/06/2020   KAPLAMBRATIO 0.10 (L) 02/26/2020   Lab Results  Component Value Date   LDH 216 (H) 02/21/2020    Surgical pathology ([id number]) on 02/26/2020: Bone marrow aspirate and clot: slightly hypercellular bone marrow for age with plasmacytosis (23%).  DIAGNOSTIC IMAGING:  I have independently reviewed the scans and discussed with the patient. NM PET Image Initial (PI) Whole Body  Result Date: 02/26/2020 CLINICAL DATA:  Initial treatment strategy for renal amyloidosis. EXAM: NUCLEAR MEDICINE PET WHOLE BODY TECHNIQUE: 13.3 mCi F-18 FDG was injected intravenously. Full-ring PET imaging was performed from the head to foot after the radiotracer. CT data was obtained and used for attenuation correction and anatomic localization. Fasting blood glucose: 93 mg/dl COMPARISON:  CT scan 03/10/2018 FINDINGS: Mediastinal blood pool activity: SUV max 2.19 HEAD/NECK: No hypermetabolic activity in the scalp. No hypermetabolic cervical lymph nodes. Incidental CT findings: none CHEST: Small scattered mediastinal and hilar lymph nodes are mildly hypermetabolic. These are more likely inflammatory/reactive. Most of the nodes measure less than 8 mm. SUV max is 4.0. No worrisome pulmonary lesions. No chest wall mass, supraclavicular or axillary adenopathy. Incidental CT findings: Surgical changes from bypass surgery. Aortic and coronary artery calcifications are noted. ABDOMEN/PELVIS: No abnormal hypermetabolic activity within the liver, pancreas, adrenal glands, or spleen. No hypermetabolic lymph nodes in the abdomen or pelvis. Incidental CT findings: Severe/advanced atherosclerotic calcifications involving the aorta, iliac arteries and branch vessels. No focal aneurysm. Borderline splenomegaly but no hypermetabolism or lesions. SKELETON: No significant osseous findings. Incidental CT findings: none EXTREMITIES: No abnormal hypermetabolic activity in the lower extremities.  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 This is unchanged since 2019. IMPRESSION: 1. Scattered borderline mediastinal and hilar lymph nodes with low level hypermetabolism. This is most likely inflammatory/reactive. A follow-up chest CT in 4-6 months may be helpful to reassess. 2. No significant findings in the abdomen/pelvis. 3. 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 Electronically Signed   By: Marijo Sanes M.D.   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 Evaluate for cardiac amyloidosis. EXAM: CARDIAC MRI TECHNIQUE: The patient was scanned on a 1.5 Tesla GE magnet. A dedicated cardiac coil was used. Functional imaging was done using Fiesta sequences. 2,3, and 4 chamber views were done to assess for RWMA's. Modified Simpson's rule using a short axis stack was used to calculate an ejection fraction on a dedicated work Conservation officer, nature. The patient received 9 cc of Gadavist. After 10 minutes inversion recovery sequences were used to assess for infiltration and scar tissue. CONTRAST:  9 cc  of Gadavist FINDINGS: 1. Mildly dilated left ventricle with moderate basal septal hypertrophy and mild hypertrophy of the remaining myocardium and moderately decreased systolic function (LVEF = 38%). There is akinesis of the entire inferior wall and hypokinesis of the basal  inferoseptal wall. There is transmural (75-100%) late gadolinium enhancement in the basal, mid and apical inferior walls. ECV (Extracellular volume 27%). ECV (Extracellular volume 27%). LVEDD: 60 mm LVESD: 51 mm LVEDV: 205 ml LVESV: 128 ml SV: 78 ml CO: 6.1 L/min Myocardial mass: 171 g 2. Normal right ventricular size, thickness and systolic function (RVEF = 49%). There are no regional wall motion  abnormalities. 3. Moderately dilated left atrium (47 mm). Mildly dilated right atrium. 4. Normal size of the aortic root, ascending aorta and pulmonary artery. 5. Mild mitral and aortic regurgitation. Trivial tricuspid regurgitation. 6. Normal pericardium. No pericardial effusion. Pericardial fat pad. IMPRESSION: 1. Mildly dilated left ventricle with moderate basal septal hypertrophy and mild hypertrophy of the remaining myocardium and moderately decreased systolic function (LVEF = 38%). There is akinesis of the entire inferior wall and hypokinesis of the basal inferoseptal wall. There is transmural (75-100%) late gadolinium enhancement in the basal, mid and apical inferior walls. ECV (Extracellular volume 27%). 2. Normal right ventricular size, thickness and systolic function (RVEF = 49%). There are no regional wall motion abnormalities. 3. Moderately dilated left atrium (47 mm). Mildly dilated right atrium. 4. Normal size of the aortic root, ascending aorta and pulmonary artery. 5. Mild mitral and aortic regurgitation. Trivial tricuspid regurgitation. 6. Normal pericardium. No pericardial effusion. Pericardial fat pad. These findings are consistent with ischemic cardiomyopathy with an old transmural scar in the inferior walls and moderate LV dysfunction. There is no evidence for amyloid deposits in the myocardium. Electronically Signed   By: Ena Dawley   On: 03/03/2020 17:09     ASSESSMENT:  1.  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 Calton Dach -Dr. Joylene Grapes at [facility]-SPEP 0.2 g, immunofixation with biclonal IgG with lambda specificity. -Free kappa light chain 16.7, lambda light chains 122.7, ratio 0.14. -Kidney biopsy on 02/04/2020 with AL amyloidosis, lambda immunophenotype, Congo red stain positive.  Immunofluorescence microscopy shows dense homogeneous glomerular mesangial staining with antisera  specific for IgG 1-2+, IgM 2+, C1q 2+, lambda light chains 3+.  Waxy homogeneous staining noted along the blood vessels.  Congo red stain demonstrates positive apocrine birefringence within the glomerular mesangial regions and along blood vessel walls. -48 pound weight loss since summer 2021 with decreased appetite.  Positive for fatigue. -Reports occasional nausea, no vomiting.  Positive for occipital headaches. -Denies any tingling or numbness in the extremities.  He has cramps in the left arm which is new.  He had leg cramps for a long time. -Cardiac MRI on 02/29/2020 with EF 38%, no evidence of amyloid deposits in the myocardium. -PET scan on 02/26/2020 with scattered borderline mediastinal and hilar lymph nodes with low-level hypermetabolism most likely inflammatory or reactive.  No bone abnormalities.  No findings in the abdomen or pelvis. -Bone marrow biopsy on 02/26/2020 with slightly hypercellular bone marrow with 23% cells consistent with plasma cells, staining for lambda light chain.  No lymphoproliferative disorder.  Congo red stain was negative. -Bone marrow cytogenetics were normal.  FISH panel for multiple myeloma was negative. -M spike was 0.1 g, immunofixation shows IgG lambda.  LDH was elevated at 216.  Beta-2 microglobulin 4.5.  2.  Social/family history: -He works for [employer] and retired 5 years ago. Never smoker. -Maternal aunt had cancer in male organs.  One maternal first cousin had appendiceal cancer, another maternal first cousin had cancer of the digestive system.   PLAN:  1.  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 - Dr Raliegh Ip reviewed results of the bone marrow biopsy, FISH panel and chromosome analysis. -Results are consistent with IgG lambda multiple myeloma with AL amyloid deposition in the kidney. -Dr Raliegh Ip initially planned VRD regimen, but second  opinion from [facility] Dr Aris Lot suggestion was to consider first line dara/vel/cyclo/dex per ANDROMEDA trial. No dose adjustment needed today, will proceed with this today.   Briefly discussed about this plan today. Pt is willing to proceed ASAP. Dr Aris Lot has discussed the benefit of this treatment plan in detail with patient, side effects include but not limited to fatigue, nausea, vomiting, cytopenias, neuropathy, cardiac dysfunction etc. Would follow myeloma markers with each cycle, Standing orders placed. Would also obtain 24 urine with total protein concentration and UPE with IFE at least every other cycle, ordered UPEP, reflex IFE may have to be ordered. In [place], 53 percent of patients with renal involvement experienced a response defined as decrease in proteinuria by 30% or a drop in proteinuria below 0.5g/24 hours  Role of ASCT in amyloidosis is limited by historical studies which show high mortality in this population. This was mostly driven by AL Amyloid with cardiac involvement. Can address the role of consolidation with high dose chemotherapy and ASCT in Future visits.   High level proteinuria.   Have requested evaluation by nutritionist in clinic   Dr Aris Lot would consider placement of GI tube early, rather than late in the disease course as malnutrition is a leading cause of mortality  This is not guideline directed, but would consider if patient continues to experience weight loss secondary to protein dumping  Will convey this to Dr Raliegh Ip.  Possible GI involvement  Will refer to GI for expedited EGD with random biopsies to eval for amyloid deposition  Will require random biopsies of stomach with congo red staining for full evaluation.   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 No dose adjustment needed Will continue to monitor.  Chemo education by our pharmacist.  Orders placed this encounter:  No orders of the defined types were placed in this encounter.  Benay Pike  MD  I have spent 45 minutes in the care of this patient including H and P, review of outside records from Dr Aris Lot, coordination with nursing, new treatment plan, coordination of care and counseling.

## 2020-03-20 ENCOUNTER — Inpatient Hospital Stay (HOSPITAL_COMMUNITY): Payer: Medicare Other

## 2020-03-20 ENCOUNTER — Encounter (HOSPITAL_COMMUNITY): Payer: Self-pay | Admitting: Hematology and Oncology

## 2020-03-20 ENCOUNTER — Other Ambulatory Visit (HOSPITAL_COMMUNITY): Payer: Medicare Other

## 2020-03-20 ENCOUNTER — Inpatient Hospital Stay (HOSPITAL_BASED_OUTPATIENT_CLINIC_OR_DEPARTMENT_OTHER): Payer: Medicare Other | Admitting: Hematology and Oncology

## 2020-03-20 ENCOUNTER — Other Ambulatory Visit: Payer: Self-pay

## 2020-03-20 VITALS — BP 137/92 | HR 92 | Temp 97.8°F | Resp 16 | Wt 187.7 lb

## 2020-03-20 VITALS — BP 132/85 | HR 98 | Temp 97.0°F | Resp 16

## 2020-03-20 DIAGNOSIS — C9000 Multiple myeloma not having achieved remission: Secondary | ICD-10-CM | POA: Diagnosis not present

## 2020-03-20 DIAGNOSIS — Z5111 Encounter for antineoplastic chemotherapy: Secondary | ICD-10-CM | POA: Diagnosis not present

## 2020-03-20 DIAGNOSIS — E8581 Light chain (AL) amyloidosis: Secondary | ICD-10-CM

## 2020-03-20 LAB — COMPREHENSIVE METABOLIC PANEL
ALT: 13 U/L (ref 0–44)
AST: 16 U/L (ref 15–41)
Albumin: 2.4 g/dL — ABNORMAL LOW (ref 3.5–5.0)
Alkaline Phosphatase: 124 U/L (ref 38–126)
Anion gap: 8 (ref 5–15)
BUN: 33 mg/dL — ABNORMAL HIGH (ref 8–23)
CO2: 24 mmol/L (ref 22–32)
Calcium: 8.4 mg/dL — ABNORMAL LOW (ref 8.9–10.3)
Chloride: 100 mmol/L (ref 98–111)
Creatinine, Ser: 2.12 mg/dL — ABNORMAL HIGH (ref 0.61–1.24)
GFR, Estimated: 33 mL/min — ABNORMAL LOW (ref >60)
Glucose, Bld: 104 mg/dL — ABNORMAL HIGH (ref 70–99)
Potassium: 4.6 mmol/L (ref 3.5–5.1)
Sodium: 132 mmol/L — ABNORMAL LOW (ref 135–145)
Total Bilirubin: 0.5 mg/dL (ref 0.3–1.2)
Total Protein: 5.3 g/dL — ABNORMAL LOW (ref 6.5–8.1)

## 2020-03-20 LAB — CBC WITH DIFFERENTIAL/PLATELET
Abs Immature Granulocytes: 0.13 10*3/uL — ABNORMAL HIGH (ref 0.00–0.07)
Basophils Absolute: 0.2 10*3/uL — ABNORMAL HIGH (ref 0.0–0.1)
Basophils Relative: 1 %
Eosinophils Absolute: 0.8 10*3/uL — ABNORMAL HIGH (ref 0.0–0.5)
Eosinophils Relative: 5 %
HCT: 51.9 % (ref 39.0–52.0)
Hemoglobin: 17 g/dL (ref 13.0–17.0)
Immature Granulocytes: 1 %
Lymphocytes Relative: 18 %
Lymphs Abs: 2.9 10*3/uL (ref 0.7–4.0)
MCH: 30.4 pg (ref 26.0–34.0)
MCHC: 32.8 g/dL (ref 30.0–36.0)
MCV: 92.7 fL (ref 80.0–100.0)
Monocytes Absolute: 1.2 10*3/uL — ABNORMAL HIGH (ref 0.1–1.0)
Monocytes Relative: 8 %
Neutro Abs: 10.7 10*3/uL — ABNORMAL HIGH (ref 1.7–7.7)
Neutrophils Relative %: 67 %
Platelets: 170 10*3/uL (ref 150–400)
RBC: 5.6 MIL/uL (ref 4.22–5.81)
RDW: 17.7 % — ABNORMAL HIGH (ref 11.5–15.5)
WBC: 16 10*3/uL — ABNORMAL HIGH (ref 4.0–10.5)
nRBC: 0 % (ref 0.0–0.2)

## 2020-03-20 LAB — TYPE AND SCREEN
ABO/RH(D): O POS
Antibody Screen: NEGATIVE

## 2020-03-20 LAB — VITAMIN B12: Vitamin B-12: 834 pg/mL (ref 180–914)

## 2020-03-20 LAB — VITAMIN D 25 HYDROXY (VIT D DEFICIENCY, FRACTURES): Vit D, 25-Hydroxy: 13.41 ng/mL — ABNORMAL LOW (ref 30–100)

## 2020-03-20 MED ORDER — MONTELUKAST SODIUM 10 MG PO TABS
ORAL_TABLET | ORAL | Status: AC
  Filled 2020-03-20: qty 1

## 2020-03-20 MED ORDER — PALONOSETRON HCL INJECTION 0.25 MG/5ML
0.2500 mg | Freq: Once | INTRAVENOUS | Status: AC
  Administered 2020-03-20: 0.25 mg via INTRAVENOUS

## 2020-03-20 MED ORDER — DEXAMETHASONE 4 MG PO TABS: 40.0000 mg | ORAL_TABLET | Freq: Once | ORAL | Status: DC

## 2020-03-20 MED ORDER — DARATUMUMAB-HYALURONIDASE-FIHJ 1800-30000 MG-UT/15ML ~~LOC~~ SOLN
1800.0000 mg | Freq: Once | SUBCUTANEOUS | Status: AC
  Administered 2020-03-20: 1800 mg via SUBCUTANEOUS
  Filled 2020-03-20: qty 15

## 2020-03-20 MED ORDER — SODIUM CHLORIDE 0.9 % IV SOLN
500.0000 mg | Freq: Once | INTRAVENOUS | Status: AC
  Administered 2020-03-20: 500 mg via INTRAVENOUS
  Filled 2020-03-20: qty 25

## 2020-03-20 MED ORDER — BORTEZOMIB CHEMO SQ INJECTION 3.5 MG (2.5MG/ML)
1.3000 mg/m2 | Freq: Once | INTRAMUSCULAR | Status: AC
  Administered 2020-03-20: 2.75 mg via SUBCUTANEOUS
  Filled 2020-03-20: qty 1.1

## 2020-03-20 MED ORDER — MONTELUKAST SODIUM 10 MG PO TABS
10.0000 mg | ORAL_TABLET | Freq: Once | ORAL | Status: AC
  Administered 2020-03-20: 10 mg via ORAL

## 2020-03-20 MED ORDER — ACETAMINOPHEN 325 MG PO TABS
ORAL_TABLET | ORAL | Status: AC
  Filled 2020-03-20: qty 2

## 2020-03-20 MED ORDER — ACETAMINOPHEN 325 MG PO TABS
650.0000 mg | ORAL_TABLET | Freq: Once | ORAL | Status: AC
  Administered 2020-03-20: 650 mg via ORAL

## 2020-03-20 MED ORDER — DIPHENHYDRAMINE HCL 25 MG PO CAPS
50.0000 mg | ORAL_CAPSULE | Freq: Once | ORAL | Status: AC
  Administered 2020-03-20: 50 mg via ORAL

## 2020-03-20 MED ORDER — DIPHENHYDRAMINE HCL 25 MG PO CAPS
ORAL_CAPSULE | ORAL | Status: AC
  Filled 2020-03-20: qty 2

## 2020-03-20 MED ORDER — SODIUM CHLORIDE 0.9 % IV SOLN: Freq: Once | INTRAVENOUS | Status: AC

## 2020-03-20 MED ORDER — ACYCLOVIR 400 MG PO TABS: 400.0000 mg | ORAL_TABLET | Freq: Two times a day (BID) | ORAL | 5 refills | Status: DC

## 2020-03-20 MED ORDER — PALONOSETRON HCL INJECTION 0.25 MG/5ML
INTRAVENOUS | Status: AC
  Filled 2020-03-20: qty 5

## 2020-03-20 MED ORDER — SODIUM CHLORIDE 0.9 % IV SOLN
40.0000 mg | Freq: Once | INTRAVENOUS | Status: AC
  Administered 2020-03-20: 40 mg via INTRAVENOUS
  Filled 2020-03-20: qty 4

## 2020-03-20 MED ORDER — DEXAMETHASONE 4 MG PO TABS
ORAL_TABLET | ORAL | Status: AC
  Filled 2020-03-20: qty 5

## 2020-03-20 NOTE — Patient Instructions (Addendum)
Daratumumab (Darzalex Faspro)  About This Drug Daratumumab is used to treat cancer. It is given under the skin in your abdomen (subcutaneously).    Possible Side Effects  . You may have a reaction to the drug. Sometimes you may be given medication to stop or lessen these side effects. Your nurse will check you closely for these signs: fever or shaking chills, flushing, facial swelling, feeling dizzy, headache, trouble breathing, rash, itching, chest tightness, or chest pain. These reactions may happen after your infusion. If this happens, call 911 for emergency care.  . Decrease in the number of white blood cells and platelets. This may raise your risk of infection, and raise your risk of bleeding.  . Fever and chills  . Tiredness  . Feeling dizzy  . Trouble sleeping  . Cough and trouble breathing  . Upper respiratory infection  . Nausea and throwing up (vomiting)  . Loose bowel movements (diarrhea)  . Constipation (not able to move bowels)  . Muscle spasms  . Pain in the joints  . Back pain  . Swelling of your legs, ankles and/or feet  . Effects on the nerves are called peripheral neuropathy. You may feel numbness, tingling, or pain in your hands and feet. It may be hard for you to button your clothes, open jars, or walk as usual. The effect on the nerves may get worse with more doses of the drug. These effects get better in some people after the drug is stopped but it does not get better in all people.  Note: Each of the side effects above was reported in 20% or greater of patients treated with daratumumab. Not all possible side effects are included above.  Warnings and Precautions  . Severe decrease in the number of white blood cells and platelets    Severe allergic reaction  . This medication can affect the results of blood tests that match your blood type. Your blood type will be tested before treatment. Be sure to tell all healthcare providers you are taking  this medicine before receiving blood transfusions, even for 6 months after your last dose.  Important Information  . This drug may be present in the saliva, tears, sweat, urine, stool, vomit, semen, and vaginal secretions. Talk to your doctor and/or your nurse about the necessary precautions to take during this time.  Treating Side Effects  . Drink plenty of fluids (a minimum of eight glasses per day is recommended).  . If you throw up or have loose bowel movements, you should drink more fluids so that you do not become dehydrated (lack of water in the body from losing too much fluid).  . To help with nausea and vomiting, eat small, frequent meals instead of three large meals a day. Choose foods and drinks that are at room temperature. Ask your nurse or doctor about other helpful tips and medicine that is available to help stop or lessen these symptoms.  . If you have diarrhea, eat low-fiber foods that are high in protein and calories and avoid foods that can irritate your digestive tracts or lead to cramping.  . Ask your nurse or doctor about medicine that can lessen or stop your diarrhea or constipation.  . If you are not able to move your bowels, check with your doctor or nurse before you use enemas, laxatives, or suppositories.  . Manage tiredness by pacing your activities for the day.  . Be sure to include periods of rest between energy-draining activities.  . To  decrease the risk of infection, wash your hands regularly.  . Avoid close contact with people who have a cold, the flu, or other infections.  . Take your temperature as your doctor or nurse tells you, and whenever you feel like you may have a fever.  . To help decrease the risk of bleeding, use a soft toothbrush. Check with your nurse before using dental floss.  . Be very careful when using knives or tools.  . Use an electric shaver instead of a razor.  Marland Kitchen Keeping your pain under control is important to your  well-being. Please tell your doctor or nurse if you are experiencing pain.  . If you are dizzy, get up slowly after sitting or lying.  . If you are having trouble sleeping, talk to your nurse or doctor on tips to help you sleep better.  . If you have numbness and tingling in your hands and feet, be careful when cooking, walking, and handling sharp objects and hot liquids.  . Infusion reactions may rarely occur after your infusion. If this happens, call 911 for emergency care.  Food and Drug Interactions  . There are no known interactions of daratumumab with food.  . This drug may interact with other medicines. Tell your doctor and pharmacist about all the prescription and over-the-counter medicines and dietary supplements (vitamins, minerals, herbs and others) that you are taking at this time. Also, check with your doctor or pharmacist before starting any new prescription or over-the-counter medicines, or dietary supplements to make sure that there are no interactions.  When to Call the Doctor  Call your doctor or nurse if you have any of these symptoms and/or any new or unusual symptoms:  . Fever of 100.4 F (38 C) or higher  . Chills  . Pain in your chest  . Coughing up yellow, green, or bloody mucus.  . Wheezing or trouble breathing  . Tiredness that interferes with your daily activities  . Trouble falling or staying asleep  . Feeling dizzy or lightheaded  . Easy bleeding or bruising  . Nausea that stops you from eating or drinking and/or is not relieved by prescribed medicines  . Vomiting  . No bowel movement in 3 days or when you feel uncomfortable.  . Loose bowel movements (diarrhea) 4 times a day or loose bowel movements with lack of strength or a feeling of being dizzy  . Weight gain of 5 pounds in one week (fluid retention)  . Swelling of your legs, ankles and/or feet  . Pain that does not go away, or is not relieved by prescribed medicines  . Signs of  infusion reaction: fever or shaking chills, flushing, facial swelling, feeling dizzy, headache, trouble breathing, rash, itching, chest tightness, or chest pain. If this happens, call 911 for emergency care.  . Numbness, tingling, or pain in your hands and feet  . If you think you may be pregnant or may have impregnated your partner  Reproduction Warnings  . Pregnancy warning: This drug may have harmful effects on the unborn baby. Women of childbearing potential should use effective methods of birth control during your cancer treatment and for at least 3 months after treatment. Let your doctor know right away if you think you may be pregnant.  . Breastfeeding warning: It is not known if this drug passes into breast milk. For this reason, women should talk to their doctor about the risks and benefits of breastfeeding during treatment with this drug because this drug may  enter the breast milk and cause harm to a breastfeeding baby.  . Fertility warning: Human fertility studies have not been done with this drug. Talk with your doctor or nurse if you plan to have children. Ask for information on sperm or egg banking.    Cyclophosphamide (Generic Name) Other Names: Cytoxan, Neosar  About This Drug  Cyclophosphamide is a drug used to treat cancer. It is given in the vein (IV) or by mouth.  Possible Side Effects (More Common)  . Nausea and throwing up (vomiting). These symptoms may happen within a few hours after your treatment and may last up to 72 hours. Medicines are available to stop or lessen these side effects.  . Bone marrow depression. This is a decrease in the number of white blood cells, red blood cells, and platelets. This may raise your risk of infection, make you tired and weak (fatigue), and raise your risk of bleeding.  . Hair loss: You may notice hair getting thin. Some patients lose their hair. Hair loss is often complete scalp hair loss and can involve loss of eyebrows,  eyelashes, and pubic hair. You may notice this a few days or weeks after treatment has started. Most often hair loss is temporary; your hair should grow back when treatment is done.  . Decreased appetite (decreased hunger)  . Blurred vision  . Soreness of the mouth and throat. You may have red areas, white patches, or sores that hurt.  . Effects on the bladder. This drug may cause irritation and bleeding in the bladder. You may have blood in your urine. To help stop this, you will get extra fluids to help you pass more urine. You may get a drug called mesna, which helps to decrease irritation and bleeding. You may also get a medicine to help you pass more urine. You may have a catheter (tube) placed in your bladder so that your bladder will be washed with this drug.  Possible Side Effects (Less Common)  . Darkening of the skin or nails  . Metallic taste in the mouth  . Changes in lung tissue may happen with large amounts of this drug. These changes may not last forever, and your lung tissue may go back to normal. Sometimes these changes may not be seen for many years. You may get a cough or have trouble catching your breath.   Allergic Reactions  Serious allergic reactions including anaphylaxis are rare. While you are getting this drug in your vein (IV), tell your nurse right away if you have any of these symptoms of an allergic reaction:  . Trouble catching your breath  . Feeling like your tongue or throat are swelling  . Feeling your heart beat quickly or in a not normal way (palpitations)  . Feeling dizzy or lightheaded  . Flushing, itching, rash, and/or hives   Treating Side Effects  . Drink 6-8 cups of fluids each day unless your doctor has told you to limit your fluid intake due to some other health problem. A cup is 8 ounces of fluid. If you throw up or have loose bowel movements you should drink more fluids so that you do not become dehydrated (lack water in the body due  to losing too much fluid).  . Ask your doctor or nurse about medicine that is available to help stop or lessen nausea or throwing up.  . Mouth care is very important. Your mouth care should consist of routine, gentle cleaning of your teeth or dentures and  rinsing your mouth with a mixture of 1/2 teaspoon of salt in 8 ounces of water or  teaspoon of baking soda in 8 ounces of water. This should be done at least after each meal and at bedtime.  . If you have mouth sores, avoid mouthwash that has alcohol. Also avoid alcohol and smoking because they can bother your mouth and throat.  . Talk with your nurse about getting a wig before you lose your hair. Also, call the [facility] at 800-ACS-2345 to find out information about the " Look Good.Marland KitchenMarland KitchenFeel Better" program close to where you live. It is a free program where women undergoing chemotherapy learn about wigs, turbans and scarves as well as makeup techniques and skin and nail care.  Important Information  . Whenever you tell a doctor or nurse your health history, always tell them that you have received cyclophosphamide in the past.  . If you take this drug by mouth swallow the medicine whole. Do not chew, break or crush it.  . You can take the medicine with or without food. If you have nausea, take it with food. Do not take the pills at bedtime.  Food and Drug Interactions  There are no known interactions of cyclophosphamide with food. This drug may interact with other medicines. Tell your doctor and pharmacist about all the medicines and dietary supplements (vitamins, minerals, herbs and others) that you are taking at this time. The safety and use of dietary supplements and alternative diets are often not known. Using these might affect your cancer or interfere with your treatment. Until more is known, you should not use dietary supplements or alternative diets without your cancer doctor's help.   When to Call the Doctor  Call  your doctor or nurse right away if you have any of these symptoms:  . Fever of 100.4 F (38 C) or higher  . Chills  . Bleeding or bruising that is not normal  . Blurred vision or other changes in eyesight  . Pain when passing urine; blood in urine  . Pain in your lower back or side  . Wheezing or trouble breathing  . Swelling of legs, ankles, or feet  . Feeling dizzy or lightheaded  . Feeling confused or agitated  . Signs of liver problems: dark urine, pale bowel movements, bad stomach pain, feeling very tired and weak, unusual itching, or yellowing of the eyes or skin  . Unusual thirst or passing urine often  . Nausea that stops you from eating or drinking  . Throwing up/vomiting  Call your doctor or nurse as soon as possible if any of these symptoms happen:  . Pain in your mouth or throat that makes it hard to eat or drink  . Nausea not relieved by prescribed medicines   Sexual Problems and Reproductive Concerns  . Infertility warning: Sexual problems and reproduction concerns may happen. In both men and women, this drug may affect your ability to have children. This cannot be determined before your treatment. Talk with your doctor or nurse if you plan to have children. Ask for information on sperm or egg banking.  . In men, this drug may interfere with your ability to make sperm, but it should not change your ability to have sexual relations.  . In women, menstrual bleeding may become irregular or stop while you are getting this drug. Do not assume that you cannot become pregnant if you do not have a menstrual period.  . Women may go through  signs of menopause (change of life) like vaginal dryness or itching. Vaginal lubricants can be used to lessen vaginal dryness, itching, and pain during sexual relations.  . Genetic counseling is available for you to talk about the effects of this drug therapy on future pregnancies. Also, a genetic counselor can look at the  possible risk of problems in the unborn baby due to this medicine if an exposure happens during pregnancy.  . Pregnancy warning: This drug may have harmful effects on the unborn child, so effective methods of birth control should be used during your cancer treatment.  . Breast feeding warning: Women should not breast feed during treatment because this drug could enter the breast milk and badly harm a breast feeding baby.    Bortezomib (Velcade)  About This Drug  Bortezomib is used to treat cancer. It is given in the vein (IV) or by a shot under the skin (subcutaneously).  You will receive this injection under your skin.  Possible Side Effects  . Bone marrow suppression. Decrease in the number of white blood cells, red blood cells, and platelets. This may raise your risk of infection, make you tired and weak (fatigue), and raise your risk of bleeding.  . Nausea and vomiting (throwing up)  . Constipation (not able to move bowels)  . Diarrhea (loose bowel movements)  . Fever  . Tiredness  . Decreased appetite (decreased hunger)  . Effects on the nerves are called peripheral neuropathy. You may feel numbness, tingling, or pain in your hands and feet. It may be hard for you to button your clothes, open jars, or walk as usual. The effect on the nerves may get worse with more doses of the drug. These effects get better in some people after the drug is stopped but it does not get better in all people.  . Rash  Note: Each of the side effects above was reported in 20% or greater of patients treated with bortezomib. Not all possible side effects are included above.  Warnings and Precautions  . Severe peripheral neuropathy  . Low blood pressure  . Congestive heart failure - your heart has less ability to pump blood properly.  . Trouble breathing because of fluid build-up and/or inflammation in your lungs  . Nausea, vomiting, diarrhea and constipation which sometimes requires  treatment to help lessen these side effects. There is also an increased risk of developing a partial or complete blockage of your small and/or large intestine.  . Changes in your central nervous system can happen. The central nervous system is made up of your brain and spinal cord. You could feel extreme tiredness, agitation, confusion, have hallucinations (see or hear things that are not there), trouble understanding or speaking, loss of control of your bowels or bladder, eyesight changes, numbness or lack of strength to your arms, legs, face, or body, seizures or coma. If you start to have any of these symptoms let your doctor know right away.  . Tumor lysis syndrome: This drug may act on the cancer cells very quickly. This may affect how your kidneys work.  . Changes in your liver function Increased risk of a syndrome that affects your red blood cells, platelets and blood vessels in your kidneys, which can cause kidney failure and be life-threatening.  Important Information  . This drug may be present in the saliva, tears, sweat, urine, stool, vomit, semen, and vaginal secretions. Talk to your doctor and/or your nurse about the necessary precautions to take during this time.  Marland Kitchen  This drug may impair your ability to drive or use machinery. Use caution and tell your nurse or doctor if you feel dizzy, very sleepy, and/or experience low blood pressure.  Treating Side Effects  . Manage tiredness by pacing your activities for the day.  . Be sure to include periods of rest between energy-draining activities.  . To decrease the risk of infection, wash your hands regularly.  . Avoid close contact with people who have a cold, the flu, or other infections.  . Take your temperature as your doctor or nurse tells you, and whenever you feel like you may have a fever.  . To help decrease the risk of bleeding, use a soft toothbrush. Check with your nurse before using dental floss.  . Be very careful  when using knives or tools.  . Use an electric shaver instead of a razor.  . Ask your doctor or nurse about medicines that are available to help stop or lessen constipation.  . If you are not able to move your bowels, check with your doctor or nurse before you use enemas, laxatives, or suppositories.  . Drink plenty of fluids (a minimum of eight glasses per day is recommended).  . If you throw up or have loose bowel movements, you should drink more fluids so that you do not become dehydrated (lack of water in the body from losing too much fluid).  . If you have diarrhea, eat low-fiber foods that are high in protein and calories and avoid foods that can irritate your digestive tracts or lead to cramping.  . Ask your nurse or doctor about medicine that can lessen or stop your diarrhea.  . To help with nausea and vomiting, eat small, frequent meals instead of three large meals a day. Choose foods and drinks that are at room temperature. Ask your nurse or doctor about other helpful tips and medicine that is available to help stop or lessen these symptoms.  . To help with decreased appetite, eat foods high in calories and protein, such as meat, poultry, fish, dry beans, tofu, eggs, nuts, milk, yogurt, cheese, ice cream, pudding, and nutritional supplements.  . Consider using sauces and spices to increase taste. Daily exercise, with your doctor's approval, may increase your appetite.  . If you have numbness and tingling in your hands and feet, be careful when cooking, walking, and handling sharp objects and hot liquids.  . If you get a rash do not put anything on it unless your doctor or nurse says you may. Keep the area around the rash clean and dry. Ask your doctor for medicine if your rash bothers you.  Food and Drug Interactions  . This drug may interact with grapefruit and grapefruit juice. Talk to your doctor as this could make side effects worse.  . Check with your doctor or pharmacist  about all other prescription medicines and over-the-counter medicines and dietary supplements (vitamins, minerals, herbs and others) you are taking before starting this medicine as there are known drug interactions with bortezomib. Also, check with your doctor or pharmacist before starting any new prescription or over-the-counter medicines, or dietary supplements to make sure that there are no interactions.  . Avoid the use of St. John's Wort with bortezomib as this may lower the levels of the drug in your body, which can make it less effective.  When to Call the Doctor  Call your doctor or nurse if you have any of these symptoms and/or any new or unusual symptoms:  .  Fever of 100.4 F (38 C) or higher  . Chills  . Tiredness that interferes with your daily activities  . Feeling dizzy or lightheaded  . Feeling that your heart is beating in a fast or not normal way (palpitations)  . Cough  . Wheezing or trouble breathing  . Easy bleeding or bruising  . Confusion and/or agitation  . Hallucinations  . Trouble understanding or speaking  . Blurry vision or changes in your eyesight  . Numbness or lack of strength to your arms, legs, face, or body  . Symptoms of a seizure such as confusion, blacking out, passing out, loss of hearing or vision, blurred vision, unusual smells or tastes (such as burning rubber), trouble talking, tremors or shaking in parts or all of the body, repeated body movements, tense muscles that do not relax, and loss of control of urine and bowels. If you or your family member suspects you are having a seizure, call 911 right away.  . Nausea that stops you from eating or drinking and/or is not relieved by prescribed medicines  . Throwing up   . Lasting loss of appetite or rapid weight loss of five pounds in a week  . No bowel movement in 3 days or when you feel uncomfortable.  . Abdominal pain that does not go away  . Diarrhea, 4 times in one day or  diarrhea with lack of strength or a feeling of being dizzy  . Numbness, tingling, or pain your hands and feet  . Swelling of legs, ankles, and/or feet  . Weight gain of 5 pounds in one week (fluid retention)  . Decreased urine, or very dark urine  . New rash and/or itching  . Rash that is not relieved by prescribed medicines  . Signs of tumor lysis: Confusion or agitation, decreased urine, nausea/vomiting, diarrhea, muscle cramping, numbness and/or tingling, seizures.  . Signs of possible liver problems: dark urine, pale bowel movements, bad stomach pain, feeling very tired and weak, unusual itching, or yellowing of the eyes or skin  . If you think you are pregnant or may have impregnated your partner  Reproduction Warnings  . Pregnancy warning: This drug can have harmful effects on the unborn baby. Women of child bearing potential should use effective methods of birth control during your cancer treatment and for at least 7 months after treatment. Men with male partners of childbearing potential should use effective methods of birth control during your cancer treatment and for at least 4 months after your cancer treatment. Let your doctor know right away if you think you may be pregnant or may have impregnated your partner.  . Breastfeeding warning: Women should not breastfeed during treatment and for 2 months month after treatment because this drug could enter the breast milk and cause harm to a breastfeeding baby.  . Fertility warning: In men and women both, this drug may affect your ability to have children in the future. Talk with your doctor or nurse if you plan to have children. Ask for information on sperm or egg banking.   Lenalidomide (Revlimid)  About This Drug Lenalidomide is used to treat cancer. It is given orally (by mouth).  Possible Side Effects . Bone marrow suppression. This is a decrease in the number of white blood cells, red blood cells, and platelets. This  may raise your risk of infection, make you tired and weak (fatigue), and raise your risk of bleeding.  . Nausea  . Diarrhea (loose bowel movements)  .  Constipation (unable to move bowels)  . Inflammation of your stomach and/or intestines  . Pain in your abdomen or back pain  . Fever  . Tiredness and weakness  . Swelling of your legs, ankles and/or feet  . Decreased appetite (decreased hunger)  . Muscle cramps/spasms  . Pain in your joints  . Headache  . Feeling dizzy  . Tremor  . Trouble sleeping  . Nosebleed  . Upper respiratory infection, bronchitis  . Inflammation of the nasal passages and throat  . Trouble breathing  . Cough  . Rash and itching  Note: Each of the side effects above was reported in 15% or greater of patients treated with lenalidomide. Not all possible side effects are included above.  Warnings and Precautions . Blood clots and events such as stroke and heart attack. A blood clot in your leg may cause your leg to swell, appear red and warm, and/or cause pain. A blood clot in your lungs may cause trouble breathing, pain when breathing, and/or chest pain.  . Severe bone marrow suppression  . Changes in your liver function, which may cause liver failure and be life-threatening.  . Tumor lysis syndrome: This drug may act on the cancer cells very quickly. This may affect how your kidneys work and can be life-threatening.  . Changes in your thyroid function  . Severe allergic skin reaction which may be life-threatening. You may develop blisters on your skin that are filled with fluid or a severe red rash all over your body that may be painful.  . This drug may raise your risk of getting a second cancer.  . You may develop a syndrome called tumor flare reaction. You may have painful lymph nodes, enlarged spleen, fever, and a rash.  . This drug may make it more difficult to collect your stem cells if a stem cell transplant is part of your  treatment plan.  . There is a rare increased risk of death in patients with chronic lymphocytic leukemia and a risk of early death (dying sooner) in patient with mantle cell lymphoma.  . Allergic reactions, including anaphylaxis are rare but may happen in some patients. Signs of allergic reaction to this drug may be swelling of the face, feeling like your tongue or throat are swelling, trouble breathing, rash, itching, fever, chills, feeling dizzy, and/or feeling that your heart is beating in a fast or not normal way. If this happens, do not take another dose of this drug. You should get urgent medical treatment.  Note: Some of the side effects above are very rare. If you have concerns and/or questions, please discuss them with your medical team.  Important Information . You will need to sign up for a special program called [facility] when you start taking this drug. Your nurse will help you get started.  . Two negative pregnancy tests are required in women of childbearing potential prior to starting treatment. Routine pregnancy tests are required during treatment.  . Do not donate blood during your treatment and for 4 weeks after your treatment.  . Men should not donate sperm during your treatment and for 4 weeks after your treatment because this drug is present in semen and may badly harm a baby.   How to Take Your Medication . Swallow the medicine whole with water, with or without food. Do not chew, break, or open it.  . Take this medicine at about the same time each day  . Missed dose: If you miss a  dose, take it as soon as you think about it ONLY if it has been less than 12 hours since you normally take the missed dose. If it has been more than 12 hours, skip the missed dose and contact your physician. Take your next dose at the regular time. Do not take 2 doses at the same time and do not double up on the next dose.  . If you vomit a dose, take your next dose at the regular  time.  . Handling: Wash your hands after handling your medicine, your caretakers should not handle your medicine with bare hands and should wear latex gloves.  . If you get any of the content of a broken capsules on your skin, you should wash the area of the skin well with soap and water right away. Call your doctor if you get a skin reaction.  . This drug may be present in the saliva, tears, sweat, urine, stool, vomit, semen, and vaginal secretions. Talk to your doctor and/or your nurse about the necessary precautions to take during this time.  . Storage: Store this medicine in the original container at room temperature.  . Disposal of unused medicine: Do not flush any expired and/or unused medicine down the toilet or drain unless you are specifically instructed to do so on the medication label. Some facilities have take-back programs and/or other options. If you do not have a take-back program in your area, then please discuss with your nurse or your doctor how to dispose of unused medicine.  Treating Side Effects . Manage tiredness by pacing your activities for the day.  . Be sure to include periods of rest between energy-draining activities.  . If you are dizzy, get up slowly after sitting or lying.  . To decrease the risk of infection, wash your hands regularly.  . Avoid close contact with people who have a cold, the flu, or other infections.  . Take your temperature as your doctor or nurse tells you, and whenever you feel like you may have a fever.  . To help decrease the risk of bleeding, use a soft toothbrush. Check with your nurse before using dental floss.  . Be very careful when using knives or tools.  . Use an electric shaver instead of a razor.  . Ask your doctor or nurse about medicines that are available to help stop or lessen constipation and/or diarrhea.  . If you are not able to move your bowels, check with your doctor or nurse before you use enemas, laxatives, or  suppositories.  . Drink plenty of fluids (a minimum of eight glasses per day is recommended).  . Drink fluids that contribute calories (whole milk, juice, soft drinks, sweetened beverages, milkshakes, and nutritional supplements) instead of water.  . If you throw up or have loose bowel movements, you should drink more fluids so that you do not become dehydrated (lack of water in the body from losing too much fluid).  . If you have diarrhea, eat low-fiber foods that are high in protein and calories and avoid foods that can irritate your digestive tracts or lead to cramping.  . To help with nausea and vomiting, eat small, frequent meals instead of three large meals a day.  Choose foods and drinks that are at room temperature. Ask your nurse or doctor about other helpful tips and medicine that is available to help stop or lessen these symptoms.  . To help with decreased appetite, eat small, frequent meals. Eat foods high  in calories and protein, such as meat, poultry, fish, dry beans, tofu, eggs, nuts, milk, yogurt, cheese, ice cream, pudding, and nutritional supplements.  . Consider using sauces and spices to increase taste. Daily exercise, with your doctor's approval, may increase your appetite.  . If you get a rash, do not put anything on it unless your doctor or nurse says you may. Keep the area around the rash clean and dry. Ask your doctor for medicine if your rash bothers you.  Marland Kitchen Keeping your pain under control is important to your well-being. Please tell your doctor or nurse if you are experiencing pain.  . If you are having trouble sleeping, talk to your nurse or doctor on tips to help you sleep better.  . If you have a nosebleed, sit with your head tipped slightly forward. Apply pressure by lightly pinching the bridge of your nose between your thumb and forefinger. Call your doctor if you feel dizzy or faint or if the bleeding does not stop after 10 to 15 minutes.  . Moisturize your  skin several times a day.  . Avoid sun exposure and apply sunscreen routinely when outdoors.  Food and Drug Interactions  . There are no known interactions of lenalidomide with food.  . Check with your doctor or pharmacist about all other prescription medicines and over-the-counter medicines and dietary supplements (vitamins, minerals, herbs, and others) you are taking before starting this medicine as there are known drug interactions with lenalidomide. Also, check with your doctor or pharmacist before starting any new prescription or over-the-counter medicines, or dietary supplements to make sure that there are no interactions.  . There are known interactions of lenalidomide with blood-thinning medicine such as warfarin. Ask your doctor what precautions you should take.  When to Call the Doctor Call your doctor or nurse if you have any of these symptoms and/or any new or unusual symptoms: . Fever of 100.4 F (38 C) or higher  . Chills  . Tiredness that interferes with your daily activities  . Feeling dizzy or lightheaded  . Easy bleeding or bruising  . Your leg or arm is swollen, red, warm, and/or painful  . Headache that does not go away  . Nosebleed that does not stop bleeding after 10-15 minutes  . Painful lymph nodes  . Wheezing and/or trouble breathing  . Chest pain or symptoms of a heart attack. Most heart attacks involve pain in the center of the chest that lasts more than a few minutes. The pain may go away and come back. It can feel like pressure, squeezing, fullness, or pain. Sometimes pain is felt in one or both arms, the back, neck, jaw, or stomach. If any of these symptoms last 2 minutes, call 911.  Marland Kitchen Symptoms of a stroke such as sudden numbness or weakness of your face, arm, or leg, mostly on one side of your body; sudden confusion, trouble speaking or understanding; sudden trouble seeing in one or both eyes; sudden trouble walking, feeling dizzy, loss of balance  or coordination; or sudden, bad headache with no known cause. If you have any of these symptoms for 2 minutes, call 911.  . Signs of allergic reaction: swelling of the face, feeling like your tongue or throat are swelling, trouble breathing, rash, itching, fever, chills, feeling dizzy, and/or feeling that your heart is beating in a fast or not normal way. If this happens, call 911 for emergency care.  . Coughing up yellow, green, or bloody mucus  . Feeling  that your heart is beating in a fast or not normal way (palpitations)  . Nausea that stops you from eating or drinking and/or is not relieved by prescribed medicines  . Throwing up more than 3 times a day  . Loose bowel movements (diarrhea) 4 times a day or loose bowel movements with lack of strength or a feeling of being dizzy  . No bowel movement in 3 days or when you feel uncomfortable  . Trouble falling or staying asleep  . Pain in your abdomen that does not go away  . Weight gain of 5 pounds in one week (fluid retention)  . Swelling of your legs, ankles and/or feet  . Unexplained weight gain  . Lasting loss of appetite or rapid weight loss of five pounds in a week  . Pain that does not go away, or is not relieved by prescribed medicines  . Flu-like symptoms: fever, headache, muscle and joint aches, and fatigue (low energy, feeling weak)  . A new rash or itching that is not relieved by prescribed medicines  . Signs of possible liver problems: dark urine, pale bowel movements, bad stomach pain, feeling very tired and weak, unusual itching, or yellowing of the eyes or skin  . Signs of tumor lysis: confusion or agitation, decreased urine, nausea/vomiting, diarrhea, muscle cramping, numbness and/or tingling, seizures  . If you think you may be pregnant or may have impregnated your partner  Reproduction Warnings . Pregnancy warning: This drug can have harmful effects on the unborn baby. Women of childbearing potential must  commit to abstain from heterosexual intercourse or use 2 effective methods of birth control, one of which, must be a highly effective method of birth control, beginning at least 4 weeks before treatment starts, during your cancer treatment, including dose interruptions, and for at least 4 weeks after treatment. A highly effective method of birth control includes tubal ligation, intrauterine device (IUD), hormonal (birth control pills, injections, patch and/or implants) or a partner's vasectomy. Stop taking lenalidomide immediately and let your doctor know right away if you think you may be pregnant, miss your menstrual period, or experience unusual menstrual bleeding.  . Men with male partners of childbearing potential should use effective methods of birth control during your cancer treatment and for at least 4 weeks after your cancer treatment.   . Breastfeeding warning: Women should not breastfeed during treatment because this drug could enter the breast milk and cause harm to a breastfeeding baby.  . Fertility warning: Human fertility studies have not been done with this drug. Talk with your doctor or nurse if you plan to have children. Ask for information on sperm or egg banking.   Dexamethasone (Decadron)  About This Drug  Dexamethasone is used to treat cancer, to decrease inflammation and sometimes used before and after chemotherapy to prevent or treat nausea and/or vomiting. It is given in the vein (IV) or orally (by mouth).  Possible Side Effects  . Headache  . High blood pressure  . Abnormal heart beat  . Tiredness and weakness  . Changes in mood, which may include depression or a feeling of extreme well-being  . Trouble sleeping  . Increased sweating  . Increased appetite (increased hunger)  . Weight gain  . Increase risk of infections  . Pain in your abdomen  . Nausea  . Skin changes such as rash, dryness, redness  . Blood sugar levels may change  .  Electrolyte changes  . Swelling of your legs, ankles and/or  feet  . Changes in your liver function  . You may be at risk for cataracts, glaucoma or infections of the eye  . Muscle loss and / or weakness (lack of muscle strength)  . Increased risk of developing osteoporosis- your bones may become weak and brittle  Note: Not all possible side effects are included above.  Warnings and Precautions  . This drug may cause you to feel irritable, nervous or restless.  . Allergic reactions, including anaphylaxis are rare but may happen in some patients. Signs of allergic reaction to this drug may be swelling of the face, feeling like your tongue or throat are swelling, trouble breathing, rash, itching, fever, chills, feeling dizzy, and/or feeling that your heart is beating in a fast or not normal way. If this happens, do not take another dose of this drug. You should get urgent medical treatment.  . High blood pressure and changes in electrolytes, which can cause fluid build-up around your heart, lungs or elsewhere.  . Increased risk of developing a hole in your stomach, small, and/or large intestine if you have ulcers in the lining of your stomach and/or intestine, or have diverticulitis, ulcerative colitis and/or other diseases that affect the gastrointestinal tract.  . Effects on the endocrine glands including the pituitary, adrenals or thyroid during or after use of this medication.  . Changes in the tissue of the heart, that can cause your heart to have less ability to pump blood. You may be short of breath or our arms, hands, legs and feet may swell.  . Increased risk of heart attack.  . Severe depression and other psychiatric disorders such as mood changes.  . Burning, pain and itching around your anus may happen when this drug is given in the vein too rapidly (IV). It usually happens suddenly and resolves in less than 1 minute.  Important Information  . Talk to your doctor or your  nurse before stopping this medication, it should be stopped gradually. Depending on the dose and length of treatment, you could experience serious side effects if stopped abruptly (suddenly).  . Talk to your doctor before receiving any vaccinations during your treatment. Some vaccinations are not recommended while receiving dexamethasone.  How to Take Your Medication  . For Oral (by mouth): You can take the medicine with or without food. If you have nausea or upset stomach, take it with food.  . Missed dose: If you miss a dose, do not take 2 doses at the same time or extra doses. . If you vomit a dose, take your next dose at the regular time. Do not take 2 doses at the same time  . Handling: Wash your hands after handling your medicine, your caretakers should not handle your medicine with bare hands and should wear latex gloves.  . Storage: Store this medicine in the original container at room temperature. Protect from moisture and light. Discuss with your nurse or your doctor how to dispose of unused medicine.   Treating Side Effects  . Drink plenty of fluids (a minimum of eight glasses per day is recommended).  . To help with nausea and vomiting, eat small, frequent meals instead of three large meals a day. Choose foods and drinks that are at room temperature. Ask your nurse or doctor about other helpful tips and medicine that is available to help stop or lessen these symptoms.  . If you throw up, you should drink more fluids so that you do not become dehydrated (lack of  water in the body from losing too much fluid).  . Manage tiredness by pacing your activities for the day.  . Be sure to include periods of rest between energy-draining activities.  . To help with muscle weakness, get regular exercise. If you feel too tired to exercise vigorously, try taking a short walk.  . If you are having trouble sleeping, talk to your nurse or doctor on tips to help you sleep better.  . If you  are feeling depressed, talk to your nurse or doctor about it.  Marland Kitchen Keeping your pain under control is important to your well-being. Please tell your doctor or nurse if you are experiencing pain.  . If you have diabetes, keep good control of your blood sugar level. Tell your nurse or your doctor if your glucose levels are higher or lower than normal.  . To decrease the risk of infection, wash your hands regularly.  . Avoid close contact with people who have a cold, the flu, or other infections.  . Take your temperature as your doctor or nurse tells you, and whenever you feel like you may have a fever.  . If you get a rash do not put anything on it unless your doctor or nurse says you may. Keep the area around the rash clean and dry. Ask your doctor for medicine if your rash bothers you.  . Moisturize your skin several times day.  . Avoid sun exposure and apply sunscreen routinely when outdoors.  Food and Drug Interactions  . There are no known interactions of dexamethasone with food.  . Check with your doctor or pharmacist about all other prescription medicines and over-the-counter medicines and dietary supplements (vitamins, minerals, herbs and others) you are taking before starting this medicine as there are known drug interactions with dexamethasone. Also, check with your doctor or pharmacist before starting any new prescription or over-the-counter medicines, or dietary supplement to make sure that there are no interactions.  . There are known interactions of dexamethasone with other medicines and products like acetaminophen, aspirin, and ibuprofen. Ask your doctor what over-the-counter (OTC) medicines you can take.  When to Call the Doctor  Call your doctor or nurse if you have any of these symptoms and/or any new or unusual symptoms:  . Fever of 100.4 F (38 C) or higher  . Chills  . A headache that does not go away  . Trouble breathing  . Blurry vision or other changes in  eyesight  . Feel irritable, nervous or restless  . Trouble falling or staying asleep  . Severe mood changes such as depression or unusual thoughts and/or behaviors  . Thoughts of hurting yourself or others, and suicide  . Tiredness that interferes with your daily activities  . Feeling that your heart is beating in a fast, slow or not normal way  . Feeling dizzy or lightheaded  . Chest pain or symptoms of a heart attack. Most heart attacks involve pain in the center of the chest that lasts more than a few minutes. The pain may go away and come back, or it can be constant. It can feel like pressure, squeezing, fullness, or pain. Sometimes pain is felt in one or both arms, the back, neck, jaw, or stomach. If any of these symptoms last 2 minutes, call 911.  Marland Kitchen Heartburn or indigestion  . Nausea that stops you from eating or drinking and/or is not relieved by prescribed medicines  . Throwing up more than 3 times a day  .  Pain in your abdomen that does not go away  . Abnormal blood sugar  . Unusual thirst, passing urine often, headache, sweating, shakiness, irritability  . Swelling of legs, ankles, or feet  . Weight gain of 5 pounds in one week (fluid retention)  . Signs of possible liver problems: dark urine, pale bowel movements, bad stomach pain, feeling very tired and weak, unusual itching, or yellowing of the eyes or skin  . Severe muscle weakness  . A new rash or a rash that is not relieved by prescribed medicines  . Signs of allergic reaction: swelling of the face, feeling like your tongue or throat are swelling, trouble breathing, rash, itching, fever, chills, feeling dizzy, and/or feeling that your heart is beating in a fast or not normal way. If this happens, call 911 for emergency care.  . If you think you may be pregnant  Reproduction Warnings  . Pregnancy warning: It is not known if this drug may harm an unborn child. For this reason, be sure to talk with your  doctor if you are pregnant or planning to become pregnant while receiving this drug. Let your doctor know right away if you think you may be pregnant or may have impregnated your partner.  . Breastfeeding warning: It is not known if this drug passes into breast milk. For this reason, women should talk to their doctor about the risks and benefits of breastfeeding during treatment with this drug because this drug may enter the breast milk and cause harm to a breastfeeding baby.  . Fertility warning: Human fertility studies have not been done with this drug. Talk with your doctor or nurse if you plan to have children. Ask for information on sperm banking.    SELF CARE ACTIVITIES WHILE ON CHEMOTHERAPY/IMMUNOTHERAPY:  Hydration Increase your fluid intake 48 hours prior to treatment and drink at least 8 to 12 cups (64 ounces) of water/decaffeinated beverages per day after treatment. You can still have your cup of coffee or soda but these beverages do not count as part of your 8 to 12 cups that you need to drink daily. No alcohol intake.  Medications Continue taking your normal prescription medication as prescribed.  If you start any new herbal or new supplements please let us know first to make sure it is safe.  Mouth Care Have teeth cleaned professionally before starting treatment. Keep dentures and partial plates clean. Use soft toothbrush and do not use mouthwashes that contain alcohol. Biotene is a good mouthwash that is available at most pharmacies or may be ordered by calling [phone]. Use warm salt water gargles (1 teaspoon salt per 1 quart warm water) before and after meals and at bedtime. Or you may rinse with 2 tablespoons of three-percent hydrogen peroxide mixed in eight ounces of water. If you are still having problems with your mouth or sores in your mouth please call the clinic. If you need dental work, please let the doctor know before you go for your appointment so that we can  coordinate the best possible time for you in regards to your chemo regimen. You need to also let your dentist know that you are actively taking chemo. We may need to do labs prior to your dental appointment.  Skin Care Always use sunscreen that has not expired and with SPF (Sun Protection Factor) of 50 or higher. Wear hats to protect your head from the sun. Remember to use sunscreen on your hands, ears, face, & feet.  Use good  moisturizing lotions such as udder cream, eucerin, or even Vaseline. Some chemotherapies can cause dry skin, color changes in your skin and nails.    . Avoid long, hot showers or baths. . Use gentle, fragrance-free soaps and laundry detergent. . Use moisturizers, preferably creams or ointments rather than lotions because the thicker consistency is better at preventing skin dehydration. Apply the cream or ointment within 15 minutes of showering. Reapply moisturizer at night, and moisturize your hands every time after you wash them.   Infection Prevention Please wash your hands for at least 30 seconds using warm soapy water. Handwashing is the #1 way to prevent the spread of germs. Stay away from sick people or people who are getting over a cold. If you develop respiratory systems such as green/yellow mucus production or productive cough or persistent cough let us know and we will see if you need an antibiotic. It is a good idea to keep a pair of gloves on when going into grocery stores/[employer] to decrease your risk of coming into contact with germs on the carts, etc. Carry alcohol hand gel with you at all times and use it frequently if out in public. If your temperature reaches 100.5 or higher please call the clinic and let us know.  If it is after hours or on the weekend please go to the ER if your temperature is over 100.4.  Please have your own personal thermometer at home to use.    Sex and bodily fluids If you are going to have sex, a condom must be used to protect the person  that isn't taking immunotherapy. For a few days after treatment, immunotherapy can be excreted through your bodily fluids.  When using the toilet please close the lid and flush the toilet twice.  Do this for a few day after you have had immunotherapy.   Contraception It is not known for sure whether or not immunotherapy drugs can be passed on through semen or secretions from the vagina. Because of this some doctors advise people to use a barrier method if you have sex during treatment. This applies to vaginal, anal or oral sex.  Generally, doctors advise a barrier method only for the time you are actually having the treatment and for about a week after your treatment.  Advice like this can be worrying, but this does not mean that you have to avoid being intimate with your partner. You can still have close contact with your partner and continue to enjoy sex.  Animals If you have cats or birds we just ask that you not change the litter or change the cage.  Please have someone else do this for you while you are on immunotherapy.   Food Safety During and After Cancer Treatment Food safety is important for people both during and after cancer treatment. Cancer and cancer treatments, such as chemotherapy, radiation therapy, and stem cell/bone marrow transplantation, often weaken the immune system. This makes it harder for your body to protect itself from foodborne illness, also called food poisoning. Foodborne illness is caused by eating food that contains harmful bacteria, parasites, or viruses.  Foods to avoid Some foods have a higher risk of becoming tainted with bacteria. These include: Marland Kitchen Unwashed fresh fruit and vegetables, especially leafy vegetables that can hide dirt and other contaminants . Raw sprouts, such as alfalfa sprouts . Raw or undercooked beef, especially ground beef, or other raw or undercooked meat and poultry . Fatty, fried, or spicy foods immediately before or  after treatment.   These can sit heavy on your stomach and make you feel nauseous. . Raw or undercooked shellfish, such as oysters. . Sushi and sashimi, which often contain raw fish.  . Unpasteurized beverages, such as unpasteurized fruit juices, raw milk, raw yogurt, or cider . Undercooked eggs, such as soft boiled, over easy, and poached; raw, unpasteurized eggs; or foods made with raw egg, such as homemade raw cookie dough and homemade mayonnaise  Simple steps for food safety  Shop smart. . Do not buy food stored or displayed in an unclean area. . Do not buy bruised or damaged fruits or vegetables. . Do not buy cans that have cracks, dents, or bulges. . Pick up foods that can spoil at the end of your shopping trip and store them in a cooler on the way home.  Prepare and clean up foods carefully. . Rinse all fresh fruits and vegetables under running water, and dry them with a clean towel or paper towel. . Clean the top of cans before opening them. . After preparing food, wash your hands for 20 seconds with hot water and soap. Pay special attention to areas between fingers and under nails. . Clean your utensils and dishes with hot water and soap. Marland Kitchen Disinfect your kitchen and cutting boards using 1 teaspoon of liquid, unscented bleach mixed into 1 quart of water.    Dispose of old food. . Eat canned and packaged food before its expiration date (the "use by" or "best before" date). . Consume refrigerated leftovers within 3 to 4 days. After that time, throw out the food. Even if the food does not smell or look spoiled, it still may be unsafe. Some bacteria, such as Listeria, can grow even on foods stored in the refrigerator if they are kept for too long.  Take precautions when eating out. . At restaurants, avoid buffets and salad bars where food sits out for a long time and comes in contact with many people. Food can become contaminated when someone with a virus, often a norovirus, or another "bug" handles  it. . Put any leftover food in a "to-go" container yourself, rather than having the server do it. And, refrigerate leftovers as soon as you get home. . Choose restaurants that are clean and that are willing to prepare your food as you order it cooked.    SYMPTOMS TO REPORT AS SOON AS POSSIBLE AFTER TREATMENT:   FEVER GREATER THAN 100.4 F  CHILLS WITH OR WITHOUT FEVER  NAUSEA AND VOMITING THAT IS NOT CONTROLLED WITH YOUR NAUSEA MEDICATION  UNUSUAL SHORTNESS OF BREATH  UNUSUAL BRUISING OR BLEEDING  TENDERNESS IN MOUTH AND THROAT WITH OR WITHOUT PRESENCE OF ULCERS  URINARY PROBLEMS  BOWEL PROBLEMS  UNUSUAL RASH     Wear comfortable clothing and clothing appropriate for easy access to any Portacath or PICC line. Let us know if there is anything that we can do to make your therapy better!   What to do if you need assistance after hours or on the weekends: CALL [phone].  HOLD on the line, do not hang up.  You will hear multiple messages but at the end you will be connected with a nurse triage line.  They will contact the doctor if necessary.  Most of the time they will be able to assist you.  Do not call the hospital operator.    I have been informed and understand all of the instructions given to me and have received a copy. I  have been instructed to call the clinic [phone] or my family physician as soon as possible for continued medical care, if indicated. I do not have any more questions at this time but understand that I may call the [facility] or the Patient Navigator at [phone] during office hours should I have questions or need assistance in obtaining follow-up care.   [place] Discharge Instructions for Patients Receiving Chemotherapy  Today you received the following chemotherapy agents   To help prevent nausea and vomiting after your treatment, we encourage you to take your nausea medication    If you develop nausea and vomiting  that is not controlled by your nausea medication, call the clinic.   BELOW ARE SYMPTOMS THAT SHOULD BE REPORTED IMMEDIATELY:  *FEVER GREATER THAN 100.5 F  *CHILLS WITH OR WITHOUT FEVER  NAUSEA AND VOMITING THAT IS NOT CONTROLLED WITH YOUR NAUSEA MEDICATION  *UNUSUAL SHORTNESS OF BREATH  *UNUSUAL BRUISING OR BLEEDING  TENDERNESS IN MOUTH AND THROAT WITH OR WITHOUT PRESENCE OF ULCERS  *URINARY PROBLEMS  *BOWEL PROBLEMS  UNUSUAL RASH Items with * indicate a potential emergency and should be followed up as soon as possible.  Feel free to call the clinic should you have any questions or concerns. The clinic phone number is [phone].  Please show the [facility] at check-in to the Emergency Department and triage nurse.

## 2020-03-20 NOTE — Progress Notes (Signed)
Labs reviewed and creatinine 2.12.

## 2020-03-20 NOTE — Progress Notes (Signed)
Treatment changed to :  Darzalex Faspro, Cyclophosphamide IVPB, and Velcade per Dr Chryl Heck [place] study.  Dose of Cyclophosphamide will be done at flat dose of 500 mg IVPB per study.  Giving Dexamethasone 40 mg IVPB for today as premedication verses oral dexamethasone.  CrCl 39 ml/min  PA approved for new treatment plan per financial team.  Henreitta Leber, PharmD

## 2020-03-20 NOTE — Progress Notes (Signed)
Patient presents today for treatment and follow up visit with Dr. Chryl Heck. Treatment changed by Dr. Chryl Heck. Per [facility] [place] Patient to receive Velcade, Cytoxan, Darzalex Faspro today [place]. Phenotype and type and screen drawn today. MAR reviewed and updated. Patient denies any significant changes since his last visit. Patient given teaching packet of all medications today and consent obtained. Side effect management discussed with patient. After hour and clinic hour phone numbers given to patient. Home medications discussed with patient and sent to the pharmacy by [facility] RN. Patient requesting a port placed due to he is a very difficult stick. Lab stuck the patient several times for labs. Order placed and scheduling notified.   Treatment given today per MD orders. Tolerated infusion without adverse affects. Vital signs stable. Patient monitored for 2 hours after first Darzalex Faspro Injection.  No complaints at this time. Discharged from clinic ambulatory in stable condition. Alert and oriented x 3. F/U with [facility] as scheduled.

## 2020-03-21 ENCOUNTER — Other Ambulatory Visit (HOSPITAL_COMMUNITY): Payer: Medicare Other

## 2020-03-21 LAB — PRETREATMENT RBC PHENOTYPE

## 2020-03-24 ENCOUNTER — Telehealth (HOSPITAL_COMMUNITY): Payer: Self-pay

## 2020-03-24 ENCOUNTER — Other Ambulatory Visit: Payer: Self-pay | Admitting: Student

## 2020-03-24 NOTE — Telephone Encounter (Signed)
This nurse spoke with patients wife this morning.  Stated patient started his first treatment on 03/20/20, patient then became extremely constipated over the weekend.  States that patient took two Sennakot tablets and it was very effective.  Patient continued to go until his hemorrhoids flared up and he started having a lot of anal leakage.  The spouse stated that she started using OTC hemorrhoid cream and they look better this morning and she wants to see if there is anything additional that can be done to help his hemorrhoids.    This nurse spoke with V. Tanner, PA and was advised to suggest Sitz baths,Hydrocortisone and continue hemorrhoid cream until flare up resolves.  Made wife aware.  No further questions or concerns at this time.

## 2020-03-25 ENCOUNTER — Other Ambulatory Visit: Payer: Self-pay

## 2020-03-25 ENCOUNTER — Encounter (HOSPITAL_COMMUNITY): Payer: Self-pay

## 2020-03-25 ENCOUNTER — Ambulatory Visit (HOSPITAL_COMMUNITY)
Admission: RE | Admit: 2020-03-25 | Discharge: 2020-03-25 | Disposition: A | Discharge status: Home / Self Care | Payer: Medicare Other | Source: Ambulatory Visit | Attending: Hematology and Oncology | Admitting: Hematology and Oncology

## 2020-03-25 ENCOUNTER — Other Ambulatory Visit (HOSPITAL_COMMUNITY): Payer: Self-pay | Admitting: Hematology and Oncology

## 2020-03-25 DIAGNOSIS — C9000 Multiple myeloma not having achieved remission: Secondary | ICD-10-CM | POA: Diagnosis present

## 2020-03-25 DIAGNOSIS — Z7982 Long term (current) use of aspirin: Secondary | ICD-10-CM | POA: Insufficient documentation

## 2020-03-25 DIAGNOSIS — Z79899 Other long term (current) drug therapy: Secondary | ICD-10-CM | POA: Diagnosis not present

## 2020-03-25 HISTORY — PX: IR IMAGING GUIDED PORT INSERTION: IMG5740

## 2020-03-25 MED ORDER — MIDAZOLAM HCL 2 MG/2ML IJ SOLN
INTRAMUSCULAR | Status: AC
  Filled 2020-03-25: qty 2

## 2020-03-25 MED ORDER — LIDOCAINE-EPINEPHRINE 1 %-1:100000 IJ SOLN
INTRAMUSCULAR | Status: AC
  Filled 2020-03-25: qty 1

## 2020-03-25 MED ORDER — CEFAZOLIN SODIUM-DEXTROSE 2-4 GM/100ML-% IV SOLN: 2.0000 g | Freq: Once | INTRAVENOUS | Status: DC

## 2020-03-25 MED ORDER — FENTANYL CITRATE (PF) 100 MCG/2ML IJ SOLN
INTRAMUSCULAR | Status: AC | PRN
  Administered 2020-03-25: 50 ug via INTRAVENOUS

## 2020-03-25 MED ORDER — HEPARIN SOD (PORK) LOCK FLUSH 100 UNIT/ML IV SOLN
INTRAVENOUS | Status: AC
  Filled 2020-03-25: qty 5

## 2020-03-25 MED ORDER — LIDOCAINE-EPINEPHRINE 1 %-1:100000 IJ SOLN
INTRAMUSCULAR | Status: AC | PRN
  Administered 2020-03-25: 20 mL

## 2020-03-25 MED ORDER — CEFAZOLIN SODIUM-DEXTROSE 2-4 GM/100ML-% IV SOLN
INTRAVENOUS | Status: AC
  Administered 2020-03-25: 2 g via INTRAVENOUS
  Filled 2020-03-25: qty 100

## 2020-03-25 MED ORDER — MIDAZOLAM HCL 2 MG/2ML IJ SOLN
INTRAMUSCULAR | Status: AC | PRN
  Administered 2020-03-25: 1 mg via INTRAVENOUS

## 2020-03-25 MED ORDER — SODIUM CHLORIDE 0.9 % IV SOLN: INTRAVENOUS | Status: DC

## 2020-03-25 MED ORDER — FENTANYL CITRATE (PF) 100 MCG/2ML IJ SOLN
INTRAMUSCULAR | Status: AC
  Filled 2020-03-25: qty 2

## 2020-03-25 NOTE — Progress Notes (Signed)
03/25/20 24 hour call back.  Called Mr.Geralds and left a message on the answering machine with a call back number.

## 2020-03-25 NOTE — H&P (Signed)
Chief Complaint: Patient was seen in consultation today for [facility] a cath placement at the request of [place] [facility]  Referring Physician(s): Benay Pike  Supervising Physician: Dr Ruthann Cancer  Patient Status: [facility]  History of Present Illness: Julian Miranda is a 69 y.o. 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 CAD (coronary artery disease)   . GERD (gastroesophageal reflux disease)   . Hypertension   . Myocardial infarction ([place]) 1994  . Peripheral vascular disease ([place])   . Stroke [facility])     Past Surgical History:  Procedure Laterality Date  . BYPASS GRAFT POPLITEAL TO POPLITEAL Right 03/23/2018   Procedure: BYPASS GRAFT RIGHT ABOVE KNEE POPLITEAL TO BELOW KNEE POPLITEAL ARTERY  USING RIGHT GREAT SAPHENOUS VEIN;  Surgeon: Marty Heck, MD;  Location: [place];  Service: Vascular;  Laterality: Right;  . BYPASS GRAFT POPLITEAL TO POPLITEAL Left 04/09/2019   Procedure: BYPASS  ABOVE KNEE POPLITEAL TO BELOW KNEE POPLITEAL AND LIGATION OF LEFT POPITEAL ANEURYSM;  Surgeon: Marty Heck, MD;  Location: [place];  Service: Vascular;  Laterality: Left;  . COLONOSCOPY WITH ESOPHAGOGASTRODUODENOSCOPY (EGD)    . CORONARY ARTERY BYPASS GRAFT    . EYE SURGERY Bilateral    cataracts  . FEMORAL BYPASS Right 03/23/2018  . HERNIA REPAIR    . MASTOIDECTOMY    . ROTATOR CUFF REPAIR Right   . TONSILLECTOMY    . VEIN HARVEST Right 03/23/2018   Procedure: VEIN HARVEST RIGHT GREAT SAPHENOUS;  Surgeon: Marty Heck, MD;  Location: [place];  Service: Vascular;  Laterality: Right;  . VEIN HARVEST Left 04/09/2019   Procedure: Harvest Greater Saphenous Vein and  Revise Elisa Lateral Saphenous Vein ;  Surgeon: Marty Heck, MD;  Location: [place];   Service: Vascular;  Laterality: Left;    Allergies: Vancomycin  Medications: Prior to Admission medications   Medication Sig Start Date End Date Taking? Authorizing Provider  ALPRAZolam (XANAX) 0.25 MG tablet Take 0.25 mg by mouth in the morning, at noon, in the evening, and at bedtime.    Yes [provider]  amLODipine (NORVASC) 10 MG tablet Take 10 mg by mouth daily.   Yes [provider]  aspirin 81 MG tablet Take 81 mg by mouth daily.   Yes [provider]  atorvastatin (LIPITOR) 80 MG tablet Take 80 mg by mouth at bedtime.  02/06/18  Yes [provider]  carvedilol (COREG) 25 MG tablet Take 25 mg by mouth daily.    Yes [provider]  furosemide (LASIX) 20 MG tablet Take 20 mg by mouth every Monday, Wednesday, and Friday.  10/24/18  Yes [provider]  HYDROcodone-acetaminophen (NORCO) 5-325 MG tablet Take 1 tablet by mouth every 8 (eight) hours as needed for moderate pain. 03/06/20  Yes Derek Jack, MD  icosapent Ethyl (VASCEPA) 1 g capsule Take 1 g by mouth 2 (two) times daily.   Yes [provider]  Iron-Vitamin C (VITRON-C) 65-125 MG TABS Take 1 tablet by mouth daily.   Yes [provider]  lisinopril (ZESTRIL) 20 MG tablet Take 20 mg by mouth in the morning and at bedtime.    Yes [provider]  melatonin 5 MG TABS Take 10 mg by mouth at bedtime.  Yes [provider]  Multiple Minerals (CALCIUM/MAGNESIUM/ZINC PO) Take 1 tablet by mouth at bedtime.   Yes [provider]  omeprazole (PRILOSEC) 20 MG capsule Take 20 mg by mouth 2 (two) times daily.  01/02/18  Yes [provider]  potassium chloride (KLOR-CON) 10 MEQ tablet Take 10 mEq by mouth 2 (two) times daily.  10/24/18  Yes [provider]  vitamin B-12 (CYANOCOBALAMIN) 1000 MCG tablet Take 1,000 mcg by mouth daily.   Yes [provider]  zinc gluconate 50 MG tablet Take 50 mg by mouth daily.     Yes [provider]  acetaminophen (TYLENOL) 500 MG tablet Take 500 mg by mouth 2 (two) times daily.  10/08/18   [provider]  acyclovir (ZOVIRAX) 400 MG tablet Take 1 tablet (400 mg total) by mouth 2 (two) times daily. Patient not taking: Reported on 03/24/2020 03/20/20   Derek Jack, MD  diazepam (VALIUM) 2 MG tablet Take 2 mg by mouth daily as needed (dizziness). 11/07/19   [provider]  meclizine (ANTIVERT) 25 MG tablet Take 25 mg by mouth 2 (two) times daily as needed for dizziness.     [provider]  ondansetron (ZOFRAN) 4 MG tablet Take 4 mg by mouth every 8 (eight) hours as needed for nausea. 03/11/20   [provider]  prochlorperazine (COMPAZINE) 10 MG tablet Take 1 tablet (10 mg total) by mouth every 6 (six) hours as needed for nausea or vomiting. 03/06/20   Derek Jack, MD  temazepam (RESTORIL) 15 MG capsule Take 1 capsule (15 mg total) by mouth at bedtime as needed for sleep. 03/06/20   Derek Jack, MD  traMADol (ULTRAM) 50 MG tablet Take 1 tablet (50 mg total) by mouth every 8 (eight) hours as needed. Patient taking differently: Take 50 mg by mouth every 8 (eight) hours as needed for severe pain. 02/21/20   Derek Jack, MD     Family History  Problem Relation Age of Onset  . Heart disease Mother     Social History   Socioeconomic History  . Marital status: Married    Spouse name: Not on file  . Number of children: Not on file  . Years of education: Not on file  . Highest education level: Not on file  Occupational History  . Not on file  Tobacco Use  . Smoking status: Never Smoker  . Smokeless tobacco: Never Used  Vaping Use  . Vaping Use: Never used  Substance and Sexual Activity  . Alcohol use: Never  . Drug use: Never  . Sexual activity: Not on file  Other Topics Concern  . Not on file  Social History Narrative  . Not on file   Social Determinants of Health   Financial  Resource Strain: Low Risk   . Difficulty of Paying Living Expenses: Not hard at all  Food Insecurity: No Food Insecurity  . Worried About Charity fundraiser in the Last Year: Never true  . Ran Out of Food in the Last Year: Never true  Transportation Needs: No Transportation Needs  . Lack of Transportation (Medical): No  . Lack of Transportation (Non-Medical): No  Physical Activity: Insufficiently Active  . Days of Exercise per Week: 2 days  . Minutes of Exercise per Session: 20 min  Stress: Stress Concern Present  . Feeling of Stress : To some extent  Social Connections: Moderately Integrated  . Frequency of Communication with Friends and Family: More than three times a week  .  Frequency of Social Gatherings with Friends and Family: Twice a week  . Attends Religious Services: More than 4 times per year  . Active Member of Clubs or Organizations: No  . Attends Archivist Meetings: Never  . Marital Status: Married     Review of Systems: A 12 point ROS discussed and pertinent positives are indicated in the HPI above.  All other systems are negative.  Review of Systems  Constitutional: Positive for fatigue and unexpected weight change. Negative for fever.  Respiratory: Negative for cough and shortness of breath.   Cardiovascular: Negative for chest pain.  Gastrointestinal: Negative for abdominal pain.  Musculoskeletal: Negative for gait problem.  Psychiatric/Behavioral: Negative for behavioral problems and confusion.    Vital Signs: BP 137/87   Pulse 83   Resp 14   Ht 5' 8"  (1.727 m)   Wt 186 lb (84.4 kg)   SpO2 98%   BMI 28.28 kg/m   Physical Exam Vitals reviewed.  HENT:     Mouth/Throat:     Mouth: Mucous membranes are moist.  Cardiovascular:     Rate and Rhythm: Normal rate and regular rhythm.     Heart sounds: Murmur heard.    Pulmonary:     Effort: Pulmonary effort is normal.     Breath sounds: Normal breath sounds.  Abdominal:     Palpations:  Abdomen is soft.  Musculoskeletal:        General: Normal range of motion.  Skin:    General: Skin is warm.  Neurological:     Mental Status: He is alert and oriented to person, place, and time.  Psychiatric:        Behavior: Behavior normal.     Imaging: NM PET Image Initial (PI) Whole Body  Result Date: 02/26/2020 CLINICAL DATA:  Initial treatment strategy for renal amyloidosis. EXAM: NUCLEAR MEDICINE PET WHOLE BODY TECHNIQUE: 13.3 mCi F-18 FDG was injected intravenously. Full-ring PET imaging was performed from the head to foot after the radiotracer. CT data was obtained and used for attenuation correction and anatomic localization. Fasting blood glucose: 93 mg/dl COMPARISON:  CT scan 03/10/2018 FINDINGS: Mediastinal blood pool activity: SUV max 2.19 HEAD/NECK: No hypermetabolic activity in the scalp. No hypermetabolic cervical lymph nodes. Incidental CT findings: none CHEST: Small scattered mediastinal and hilar lymph nodes are mildly hypermetabolic. These are more likely inflammatory/reactive. Most of the nodes measure less than 8 mm. SUV max is 4.0. No worrisome pulmonary lesions. No chest wall mass, supraclavicular or axillary adenopathy. Incidental CT findings: Surgical changes from bypass surgery. Aortic and coronary artery calcifications are noted. ABDOMEN/PELVIS: No abnormal hypermetabolic activity within the liver, pancreas, adrenal glands, or spleen. No hypermetabolic lymph nodes in the abdomen or pelvis. Incidental CT findings: Severe/advanced atherosclerotic calcifications involving the aorta, iliac arteries and branch vessels. No focal aneurysm. Borderline splenomegaly but no hypermetabolism or lesions. SKELETON: No significant osseous findings. Incidental CT findings: none EXTREMITIES: No abnormal hypermetabolic activity in the lower extremities. 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 This is unchanged since 2019. IMPRESSION: 1. Scattered borderline  mediastinal and hilar lymph nodes with low level hypermetabolism. This is most likely inflammatory/reactive. A follow-up chest CT in 4-6 months may be helpful to reassess. 2. No significant findings in the abdomen/pelvis. 3. 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 Electronically Signed   By: Marijo Sanes M.D.   On: 02/26/2020 12:30   MR CARDIAC MORPHOLOGY W WO CONTRAST  Result Date: 03/03/2020 CLINICAL  DATA:  69 year old male with h/o hyperlipidemia, hypertension, CAD s/p CABG x 4, NSTEMI, AL amyloidosis on kidney biopsy. Evaluate for cardiac amyloidosis. EXAM: CARDIAC MRI TECHNIQUE: The patient was scanned on a 1.5 Tesla GE magnet. A dedicated cardiac coil was used. Functional imaging was done using Fiesta sequences. 2,3, and 4 chamber views were done to assess for RWMA's. Modified Simpson's rule using a short axis stack was used to calculate an ejection fraction on a dedicated work Conservation officer, nature. The patient received 9 cc of Gadavist. After 10 minutes inversion recovery sequences were used to assess for infiltration and scar tissue. CONTRAST:  9 cc  of Gadavist FINDINGS: 1. Mildly dilated left ventricle with moderate basal septal hypertrophy and mild hypertrophy of the remaining myocardium and moderately decreased systolic function (LVEF = 38%). There is akinesis of the entire inferior wall and hypokinesis of the basal inferoseptal wall. There is transmural (75-100%) late gadolinium enhancement in the basal, mid and apical inferior walls. ECV (Extracellular volume 27%). ECV (Extracellular volume 27%). LVEDD: 60 mm LVESD: 51 mm LVEDV: 205 ml LVESV: 128 ml SV: 78 ml CO: 6.1 L/min Myocardial mass: 171 g 2. Normal right ventricular size, thickness and systolic function (RVEF = 49%). There are no regional wall motion abnormalities. 3. Moderately dilated left atrium (47 mm). Mildly dilated right atrium. 4. Normal size of the aortic root, ascending aorta and  pulmonary artery. 5. Mild mitral and aortic regurgitation. Trivial tricuspid regurgitation. 6. Normal pericardium. No pericardial effusion. Pericardial fat pad. IMPRESSION: 1. Mildly dilated left ventricle with moderate basal septal hypertrophy and mild hypertrophy of the remaining myocardium and moderately decreased systolic function (LVEF = 38%). There is akinesis of the entire inferior wall and hypokinesis of the basal inferoseptal wall. There is transmural (75-100%) late gadolinium enhancement in the basal, mid and apical inferior walls. ECV (Extracellular volume 27%). 2. Normal right ventricular size, thickness and systolic function (RVEF = 49%). There are no regional wall motion abnormalities. 3. Moderately dilated left atrium (47 mm). Mildly dilated right atrium. 4. Normal size of the aortic root, ascending aorta and pulmonary artery. 5. Mild mitral and aortic regurgitation. Trivial tricuspid regurgitation. 6. Normal pericardium. No pericardial effusion. Pericardial fat pad. These findings are consistent with ischemic cardiomyopathy with an old transmural scar in the inferior walls and moderate LV dysfunction. There is no evidence for amyloid deposits in the myocardium. Electronically Signed   By: Ena Dawley   On: 03/03/2020 17:09    Labs:  CBC: Recent Labs    02/01/20 0605 02/21/20 1511 02/26/20 0742 03/20/20 0836  WBC 15.5* 13.6* 13.8* 16.0*  HGB 16.6 17.2* 16.2 17.0  HCT 50.1 52.0 46.5 51.9  PLT 191 155 226 170    COAGS: Recent Labs    04/09/19 0635 02/01/20 0605 02/21/20 1511  INR 1.1 0.9 1.0  APTT 31  --  29    BMP: Recent Labs    04/03/19 1409 04/09/19 1215 04/10/19 0341 02/21/20 1511 03/06/20 1350 03/20/20 0836  NA 138  --  136 135 136 132*  K 4.3  --  4.5 4.5 4.7 4.6  CL 104  --  99 103 102 100  CO2 24  --  27 20* 19* 24  GLUCOSE 96  --  130* 101* 95 104*  BUN 15  --  16 24* 20 33*  CALCIUM 9.3  --  8.8* 8.7* 8.0* 8.4*  CREATININE 1.15 1.00 0.94 1.37*  1.63* 2.12*  GFRNONAA >60 >60 >60 55* 45*  33*  GFRAA >60 >60 >60  --   --   --     LIVER FUNCTION TESTS: Recent Labs    02/21/20 1511 03/06/20 1350 03/20/20 0836  BILITOT 0.6 0.8 0.5  AST 18 19 16   ALT 12 11 13   ALKPHOS 129* 144* 124  PROT 5.9* 5.8* 5.3*  ALBUMIN 2.6* 2.5* 2.4*    TUMOR MARKERS: No results for input(s): AFPTM, CEA, CA199, CHROMGRNA in the last 8760 hours.  Assessment and Plan:  Multiple Myeloma; with AL amyloidosis of kidney Chemotherapy started last week Next session Thursday 12/23 Here today for [facility] a cath placement Risks and benefits of image guided port-a-catheter placement was discussed with the patient including, but not limited to bleeding, infection, pneumothorax, or fibrin sheath development and need for additional procedures.  All of the patient's questions were answered, patient is agreeable to proceed. Consent signed and in chart.   Thank you for this interesting consult.  I greatly enjoyed meeting Shanna Strength and look forward to participating in their care.  A copy of this report was sent to the requesting provider on this date.  Electronically Signed: Lavonia Drafts, PA-C 03/25/2020, 1:29 PM   I spent a total of  30 Minutes   in face to face in clinical consultation, greater than 50% of which was counseling/coordinating care for [facility] placement

## 2020-03-25 NOTE — Procedures (Signed)
Interventional Radiology Procedure Note ° °Procedure: Single Lumen Power Port Placement   ° °Access:  Right internal jugular vein ° °Findings: Catheter tip positioned at cavoatrial junction. Port is ready for immediate use.  ° °Complications: None ° °EBL: < 10 mL ° °Recommendations:  °- Ok to shower in 24 hours °- Do not submerge for 7 days °- Routine line care  ° ° °Mithran Strike, MD ° ° ° °

## 2020-03-25 NOTE — Discharge Instructions (Addendum)
Implanted Port Insertion, Care After   [facility] to shower in 24 hours - Do not submerge for 7 days - Routine line care   This sheet gives you information about how to care for yourself after your procedure. Your health care provider may also give you more specific instructions. If you have problems or questions, contact your health care provider. What can I expect after the procedure? After the procedure, it is common to have:  Discomfort at the port insertion site.  Bruising on the skin over the port. This should improve over 3-4 days. Follow these instructions at home: [facility] care  After your port is placed, you will get a manufacturer's information card. The card has information about your port. Keep this card with you at all times.  Take care of the port as told by your health care provider. Ask your health care provider if you or a family member can get training for taking care of the port at home. A home health care nurse may also take care of the port.  Make sure to remember what type of port you have. Incision care      Follow instructions from your health care provider about how to take care of your port insertion site. Make sure you: ? Wash your hands with soap and water before and after you change your bandage (dressing). If soap and water are not available, use hand sanitizer. ? Change your dressing as told by your health care provider. ? Leave stitches (sutures), skin glue, or adhesive strips in place. These skin closures may need to stay in place for 2 weeks or longer. If adhesive strip edges start to loosen and curl up, you may trim the loose edges. Do not remove adhesive strips completely unless your health care provider tells you to do that.  Check your port insertion site every day for signs of infection. Check for: ? Redness, swelling, or pain. ? Fluid or blood. ? Warmth. ? Pus or a bad smell. Activity  Return to your normal activities as told by your health care  provider. Ask your health care provider what activities are safe for you.  Do not lift anything that is heavier than 10 lb (4.5 kg), or the limit that you are told, until your health care provider says that it is safe. General instructions  Take over-the-counter and prescription medicines only as told by your health care provider.  Do not take baths, swim, or use a hot tub until your health care provider approves. Ask your health care provider if you may take showers. You may only be allowed to take sponge baths.  Do not drive for 24 hours if you were given a sedative during your procedure.  Wear a medical alert bracelet in case of an emergency. This will tell any health care providers that you have a port.  Keep all follow-up visits as told by your health care provider. This is important. Contact a health care provider if:  You cannot flush your port with saline as directed, or you cannot draw blood from the port.  You have a fever or chills.  You have redness, swelling, or pain around your port insertion site.  You have fluid or blood coming from your port insertion site.  Your port insertion site feels warm to the touch.  You have pus or a bad smell coming from the port insertion site. Get help right away if:  You have chest pain or shortness of breath.  You  have bleeding from your port that you cannot control. Summary  Take care of the port as told by your health care provider. Keep the manufacturer's information card with you at all times.  Change your dressing as told by your health care provider.  Contact a health care provider if you have a fever or chills or if you have redness, swelling, or pain around your port insertion site.  Keep all follow-up visits as told by your health care provider. This information is not intended to replace advice given to you by your health care provider. Make sure you discuss any questions you have with your health care  provider. Document Revised: 10/18/2017 Document Reviewed: 10/18/2017 Elsevier Patient Education  2020 [employer] Moderate Conscious Sedation, Adult Sedation is the use of medicines to promote relaxation and relieve discomfort and anxiety. Moderate conscious sedation is a type of sedation. Under moderate conscious sedation, you are less alert than normal, but you are still able to respond to instructions, touch, or both. Moderate conscious sedation is used during short medical and dental procedures. It is milder than deep sedation, which is a type of sedation under which you cannot be easily woken up. It is also milder than general anesthesia, which is the use of medicines to make you unconscious. Moderate conscious sedation allows you to return to your regular activities sooner. Tell a health care provider about:  Any allergies you have.  All medicines you are taking, including vitamins, herbs, eye drops, creams, and over-the-counter medicines.  Use of steroids (by mouth or creams).  Any problems you or family members have had with sedatives and anesthetic medicines.  Any blood disorders you have.  Any surgeries you have had.  Any medical conditions you have, such as sleep apnea.  Whether you are pregnant or may be pregnant.  Any use of cigarettes, alcohol, marijuana, or street drugs. What are the risks? Generally, this is a safe procedure. However, problems may occur, including:  Getting too much medicine (oversedation).  Nausea.  Allergic reaction to medicines.  Trouble breathing. If this happens, a breathing tube may be used to help with breathing. It will be removed when you are awake and breathing on your own.  Heart trouble.  Lung trouble. What happens before the procedure? Staying hydrated Follow instructions from your health care provider about hydration, which may include:  Up to 2 hours before the procedure - you may continue to drink clear liquids, such as  water, clear fruit juice, black coffee, and plain tea. Eating and drinking restrictions Follow instructions from your health care provider about eating and drinking, which may include:  8 hours before the procedure - stop eating heavy meals or foods such as meat, fried foods, or fatty foods.  6 hours before the procedure - stop eating light meals or foods, such as toast or cereal.  6 hours before the procedure - stop drinking milk or drinks that contain milk.  2 hours before the procedure - stop drinking clear liquids. Medicine Ask your health care provider about:  Changing or stopping your regular medicines. This is especially important if you are taking diabetes medicines or blood thinners.  Taking medicines such as aspirin and ibuprofen. These medicines can thin your blood. Do not take these medicines before your procedure if your health care provider instructs you not to.  Tests and exams  You will have a physical exam.  You may have blood tests done to show: ? How well your kidneys and liver  are working. ? How well your blood can clot. General instructions  Plan to have someone take you home from the hospital or clinic.  If you will be going home right after the procedure, plan to have someone with you for 24 hours. What happens during the procedure?  An IV tube will be inserted into one of your veins.  Medicine to help you relax (sedative) will be given through the IV tube.  The medical or dental procedure will be performed. What happens after the procedure?  Your blood pressure, heart rate, breathing rate, and blood oxygen level will be monitored often until the medicines you were given have worn off.  Do not drive for 24 hours. This information is not intended to replace advice given to you by your health care provider. Make sure you discuss any questions you have with your health care provider. Document Revised: 03/04/2017 Document Reviewed: 07/12/2015 Elsevier  Patient Education  2020 [employer].

## 2020-03-26 ENCOUNTER — Encounter (HOSPITAL_COMMUNITY): Payer: Self-pay

## 2020-03-27 ENCOUNTER — Inpatient Hospital Stay (HOSPITAL_COMMUNITY): Payer: Medicare Other

## 2020-03-27 ENCOUNTER — Other Ambulatory Visit (HOSPITAL_COMMUNITY): Payer: Medicare Other

## 2020-03-27 ENCOUNTER — Other Ambulatory Visit: Payer: Self-pay

## 2020-03-27 ENCOUNTER — Other Ambulatory Visit (HOSPITAL_COMMUNITY): Payer: Self-pay | Admitting: Hematology and Oncology

## 2020-03-27 VITALS — BP 129/75 | HR 82 | Temp 96.8°F | Resp 18

## 2020-03-27 DIAGNOSIS — Z5111 Encounter for antineoplastic chemotherapy: Secondary | ICD-10-CM | POA: Diagnosis not present

## 2020-03-27 DIAGNOSIS — C9000 Multiple myeloma not having achieved remission: Secondary | ICD-10-CM

## 2020-03-27 DIAGNOSIS — E8581 Light chain (AL) amyloidosis: Secondary | ICD-10-CM

## 2020-03-27 LAB — CBC WITH DIFFERENTIAL/PLATELET
Abs Immature Granulocytes: 0.12 10*3/uL — ABNORMAL HIGH (ref 0.00–0.07)
Basophils Absolute: 0 10*3/uL (ref 0.0–0.1)
Basophils Relative: 0 %
Eosinophils Absolute: 0.6 10*3/uL — ABNORMAL HIGH (ref 0.0–0.5)
Eosinophils Relative: 4 %
HCT: 46.4 % (ref 39.0–52.0)
Hemoglobin: 15.3 g/dL (ref 13.0–17.0)
Immature Granulocytes: 1 %
Lymphocytes Relative: 16 %
Lymphs Abs: 2.1 10*3/uL (ref 0.7–4.0)
MCH: 30.6 pg (ref 26.0–34.0)
MCHC: 33 g/dL (ref 30.0–36.0)
MCV: 92.8 fL (ref 80.0–100.0)
Monocytes Absolute: 0.9 10*3/uL (ref 0.1–1.0)
Monocytes Relative: 6 %
Neutro Abs: 10 10*3/uL — ABNORMAL HIGH (ref 1.7–7.7)
Neutrophils Relative %: 73 %
Platelets: 168 10*3/uL (ref 150–400)
RBC: 5 MIL/uL (ref 4.22–5.81)
RDW: 17.2 % — ABNORMAL HIGH (ref 11.5–15.5)
WBC: 13.7 10*3/uL — ABNORMAL HIGH (ref 4.0–10.5)
nRBC: 0 % (ref 0.0–0.2)

## 2020-03-27 LAB — COMPREHENSIVE METABOLIC PANEL
ALT: 12 U/L (ref 0–44)
AST: 16 U/L (ref 15–41)
Albumin: 2 g/dL — ABNORMAL LOW (ref 3.5–5.0)
Alkaline Phosphatase: 150 U/L — ABNORMAL HIGH (ref 38–126)
Anion gap: 9 (ref 5–15)
BUN: 32 mg/dL — ABNORMAL HIGH (ref 8–23)
CO2: 24 mmol/L (ref 22–32)
Calcium: 7.5 mg/dL — ABNORMAL LOW (ref 8.9–10.3)
Chloride: 102 mmol/L (ref 98–111)
Creatinine, Ser: 1.84 mg/dL — ABNORMAL HIGH (ref 0.61–1.24)
GFR, Estimated: 39 mL/min — ABNORMAL LOW (ref >60)
Glucose, Bld: 99 mg/dL (ref 70–99)
Potassium: 4.1 mmol/L (ref 3.5–5.1)
Sodium: 135 mmol/L (ref 135–145)
Total Bilirubin: 0.4 mg/dL (ref 0.3–1.2)
Total Protein: 4.7 g/dL — ABNORMAL LOW (ref 6.5–8.1)

## 2020-03-27 MED ORDER — BORTEZOMIB CHEMO SQ INJECTION 3.5 MG (2.5MG/ML)
1.3000 mg/m2 | Freq: Once | INTRAMUSCULAR | Status: AC
  Administered 2020-03-27: 2.75 mg via SUBCUTANEOUS
  Filled 2020-03-27: qty 1.1

## 2020-03-27 MED ORDER — SODIUM CHLORIDE 0.9% FLUSH
10.0000 mL | Freq: Once | INTRAVENOUS | Status: AC
  Administered 2020-03-27: 10 mL via INTRAVENOUS

## 2020-03-27 MED ORDER — DARATUMUMAB-HYALURONIDASE-FIHJ 1800-30000 MG-UT/15ML ~~LOC~~ SOLN
1800.0000 mg | Freq: Once | SUBCUTANEOUS | Status: AC
  Administered 2020-03-27: 1800 mg via SUBCUTANEOUS
  Filled 2020-03-27: qty 15

## 2020-03-27 MED ORDER — SODIUM CHLORIDE 0.9 % IV SOLN: Freq: Once | INTRAVENOUS | Status: AC

## 2020-03-27 MED ORDER — DEXAMETHASONE 4 MG PO TABS: 40.0000 mg | ORAL_TABLET | Freq: Once | ORAL | Status: DC

## 2020-03-27 MED ORDER — ACETAMINOPHEN 325 MG PO TABS
650.0000 mg | ORAL_TABLET | Freq: Once | ORAL | Status: AC
  Administered 2020-03-27: 650 mg via ORAL
  Filled 2020-03-27: qty 2

## 2020-03-27 MED ORDER — MONTELUKAST SODIUM 10 MG PO TABS
10.0000 mg | ORAL_TABLET | Freq: Once | ORAL | Status: AC
  Administered 2020-03-27: 10 mg via ORAL
  Filled 2020-03-27: qty 1

## 2020-03-27 MED ORDER — HEPARIN SOD (PORK) LOCK FLUSH 100 UNIT/ML IV SOLN
500.0000 [IU] | Freq: Once | INTRAVENOUS | Status: AC
  Administered 2020-03-27: 500 [IU] via INTRAVENOUS

## 2020-03-27 MED ORDER — PALONOSETRON HCL INJECTION 0.25 MG/5ML
0.2500 mg | Freq: Once | INTRAVENOUS | Status: AC
  Administered 2020-03-27: 0.25 mg via INTRAVENOUS
  Filled 2020-03-27: qty 5

## 2020-03-27 MED ORDER — DIPHENHYDRAMINE HCL 25 MG PO CAPS
50.0000 mg | ORAL_CAPSULE | Freq: Once | ORAL | Status: AC
  Administered 2020-03-27: 50 mg via ORAL
  Filled 2020-03-27: qty 2

## 2020-03-27 MED ORDER — SODIUM CHLORIDE 0.9 % IV SOLN
500.0000 mg | Freq: Once | INTRAVENOUS | Status: AC
  Administered 2020-03-27: 500 mg via INTRAVENOUS
  Filled 2020-03-27: qty 25

## 2020-03-27 MED ORDER — SODIUM CHLORIDE 0.9 % IV SOLN
40.0000 mg | Freq: Once | INTRAVENOUS | Status: AC
  Administered 2020-03-27: 40 mg via INTRAVENOUS
  Filled 2020-03-27: qty 4

## 2020-03-27 NOTE — Progress Notes (Signed)
Patient presents today for CyBorD/dara today.  Vital signs within parameters for treatment.  Labs pending.  Patients only complaint is constipation that was relieved with stool softeners.  No other complaints at this time.  Treatment given today per MD orders.  Tolerated infusion without adverse affects.  Vital signs stable.  No complaints at this time.  Discharge from clinic ambulatory in stable condition.  Alert and oriented X 3.  Follow up with [facility] as scheduled.

## 2020-03-27 NOTE — Progress Notes (Signed)
Serum Creatinine 1.84 today and oncologist notified.

## 2020-03-27 NOTE — Patient Instructions (Signed)
[facility] Discharge Instructions for Patients Receiving Chemotherapy  Today you received the following chemotherapy agents   To help prevent nausea and vomiting after your treatment, we encourage you to take your nausea medication   If you develop nausea and vomiting that is not controlled by your nausea medication, call the clinic.   BELOW ARE SYMPTOMS THAT SHOULD BE REPORTED IMMEDIATELY:  *FEVER GREATER THAN 100.5 F  *CHILLS WITH OR WITHOUT FEVER  NAUSEA AND VOMITING THAT IS NOT CONTROLLED WITH YOUR NAUSEA MEDICATION  *UNUSUAL SHORTNESS OF BREATH  *UNUSUAL BRUISING OR BLEEDING  TENDERNESS IN MOUTH AND THROAT WITH OR WITHOUT PRESENCE OF ULCERS  *URINARY PROBLEMS  *BOWEL PROBLEMS  UNUSUAL RASH Items with * indicate a potential emergency and should be followed up as soon as possible.  Feel free to call the clinic should you have any questions or concerns. The clinic phone number is [phone].  Please show the CHEMO ALERT CARD at check-in to the Emergency Department and triage nurse.   

## 2020-03-27 NOTE — Progress Notes (Signed)
OK to treat with today's lab values  T.O Dr  Thea Alken, PharmD

## 2020-03-27 NOTE — Progress Notes (Signed)
Patients port flushed without difficulty.  Good blood return noted with no bruising or swelling noted at site.  Transparent dressing applied.  Patient left accessed for chemotherapy treatment. 

## 2020-03-30 ENCOUNTER — Other Ambulatory Visit (HOSPITAL_COMMUNITY): Payer: Self-pay | Admitting: Hematology

## 2020-04-03 ENCOUNTER — Other Ambulatory Visit (HOSPITAL_COMMUNITY): Payer: Medicare Other

## 2020-04-03 ENCOUNTER — Other Ambulatory Visit: Payer: Self-pay

## 2020-04-03 ENCOUNTER — Inpatient Hospital Stay (HOSPITAL_BASED_OUTPATIENT_CLINIC_OR_DEPARTMENT_OTHER): Payer: Medicare Other | Admitting: Hematology

## 2020-04-03 ENCOUNTER — Inpatient Hospital Stay (HOSPITAL_COMMUNITY): Payer: Medicare Other

## 2020-04-03 VITALS — BP 134/84 | HR 81 | Temp 96.4°F | Resp 18 | Wt 188.2 lb

## 2020-04-03 VITALS — BP 143/85 | HR 79 | Resp 18

## 2020-04-03 DIAGNOSIS — E8581 Light chain (AL) amyloidosis: Secondary | ICD-10-CM

## 2020-04-03 DIAGNOSIS — Z5111 Encounter for antineoplastic chemotherapy: Secondary | ICD-10-CM | POA: Diagnosis not present

## 2020-04-03 DIAGNOSIS — C9000 Multiple myeloma not having achieved remission: Secondary | ICD-10-CM

## 2020-04-03 LAB — CBC WITH DIFFERENTIAL/PLATELET
Abs Immature Granulocytes: 0.06 10*3/uL (ref 0.00–0.07)
Basophils Absolute: 0 10*3/uL (ref 0.0–0.1)
Basophils Relative: 0 %
Eosinophils Absolute: 0.2 10*3/uL (ref 0.0–0.5)
Eosinophils Relative: 2 %
HCT: 43.3 % (ref 39.0–52.0)
Hemoglobin: 14.6 g/dL (ref 13.0–17.0)
Immature Granulocytes: 1 %
Lymphocytes Relative: 19 %
Lymphs Abs: 2 10*3/uL (ref 0.7–4.0)
MCH: 31.3 pg (ref 26.0–34.0)
MCHC: 33.7 g/dL (ref 30.0–36.0)
MCV: 92.7 fL (ref 80.0–100.0)
Monocytes Absolute: 0.6 10*3/uL (ref 0.1–1.0)
Monocytes Relative: 5 %
Neutro Abs: 8 10*3/uL — ABNORMAL HIGH (ref 1.7–7.7)
Neutrophils Relative %: 73 %
Platelets: 106 10*3/uL — ABNORMAL LOW (ref 150–400)
RBC: 4.67 MIL/uL (ref 4.22–5.81)
RDW: 17.5 % — ABNORMAL HIGH (ref 11.5–15.5)
WBC: 10.9 10*3/uL — ABNORMAL HIGH (ref 4.0–10.5)
nRBC: 0 % (ref 0.0–0.2)

## 2020-04-03 LAB — COMPREHENSIVE METABOLIC PANEL
ALT: 13 U/L (ref 0–44)
AST: 18 U/L (ref 15–41)
Albumin: 2.2 g/dL — ABNORMAL LOW (ref 3.5–5.0)
Alkaline Phosphatase: 116 U/L (ref 38–126)
Anion gap: 9 (ref 5–15)
BUN: 31 mg/dL — ABNORMAL HIGH (ref 8–23)
CO2: 26 mmol/L (ref 22–32)
Calcium: 8 mg/dL — ABNORMAL LOW (ref 8.9–10.3)
Chloride: 100 mmol/L (ref 98–111)
Creatinine, Ser: 1.55 mg/dL — ABNORMAL HIGH (ref 0.61–1.24)
GFR, Estimated: 48 mL/min — ABNORMAL LOW (ref >60)
Glucose, Bld: 99 mg/dL (ref 70–99)
Potassium: 4.2 mmol/L (ref 3.5–5.1)
Sodium: 135 mmol/L (ref 135–145)
Total Bilirubin: 0.6 mg/dL (ref 0.3–1.2)
Total Protein: 4.6 g/dL — ABNORMAL LOW (ref 6.5–8.1)

## 2020-04-03 MED ORDER — BORTEZOMIB CHEMO SQ INJECTION 3.5 MG (2.5MG/ML)
1.3000 mg/m2 | Freq: Once | INTRAMUSCULAR | Status: AC
  Administered 2020-04-03: 2.75 mg via SUBCUTANEOUS
  Filled 2020-04-03: qty 1.1

## 2020-04-03 MED ORDER — MONTELUKAST SODIUM 10 MG PO TABS
10.0000 mg | ORAL_TABLET | Freq: Once | ORAL | Status: AC
  Administered 2020-04-03: 10 mg via ORAL
  Filled 2020-04-03: qty 1

## 2020-04-03 MED ORDER — LIDOCAINE-PRILOCAINE 2.5-2.5 % EX CREA: 1.0000 "application " | TOPICAL_CREAM | CUTANEOUS | 0 refills | Status: AC | PRN

## 2020-04-03 MED ORDER — DARATUMUMAB-HYALURONIDASE-FIHJ 1800-30000 MG-UT/15ML ~~LOC~~ SOLN
1800.0000 mg | Freq: Once | SUBCUTANEOUS | Status: AC
  Administered 2020-04-03: 1800 mg via SUBCUTANEOUS
  Filled 2020-04-03: qty 15

## 2020-04-03 MED ORDER — DEXAMETHASONE 4 MG PO TABS: 40.0000 mg | ORAL_TABLET | Freq: Once | ORAL | Status: DC

## 2020-04-03 MED ORDER — SODIUM CHLORIDE 0.9 % IV SOLN
500.0000 mg | Freq: Once | INTRAVENOUS | Status: AC
  Administered 2020-04-03: 500 mg via INTRAVENOUS
  Filled 2020-04-03: qty 25

## 2020-04-03 MED ORDER — DIPHENHYDRAMINE HCL 25 MG PO CAPS
50.0000 mg | ORAL_CAPSULE | Freq: Once | ORAL | Status: AC
  Administered 2020-04-03: 50 mg via ORAL
  Filled 2020-04-03: qty 2

## 2020-04-03 MED ORDER — MECLIZINE HCL 25 MG PO TABS: 25.0000 mg | ORAL_TABLET | Freq: Two times a day (BID) | ORAL | 3 refills | Status: AC | PRN

## 2020-04-03 MED ORDER — SODIUM CHLORIDE 0.9 % IV SOLN
40.0000 mg | Freq: Once | INTRAVENOUS | Status: AC
  Administered 2020-04-03: 40 mg via INTRAVENOUS
  Filled 2020-04-03: qty 4

## 2020-04-03 MED ORDER — SODIUM CHLORIDE 0.9 % IV SOLN: INTRAVENOUS | Status: DC

## 2020-04-03 MED ORDER — ACETAMINOPHEN 325 MG PO TABS
650.0000 mg | ORAL_TABLET | Freq: Once | ORAL | Status: AC
  Administered 2020-04-03: 650 mg via ORAL
  Filled 2020-04-03: qty 2

## 2020-04-03 MED ORDER — PALONOSETRON HCL INJECTION 0.25 MG/5ML
0.2500 mg | Freq: Once | INTRAVENOUS | Status: AC
  Administered 2020-04-03: 0.25 mg via INTRAVENOUS
  Filled 2020-04-03: qty 5

## 2020-04-03 NOTE — Patient Instructions (Signed)
[facility] at [facility] Discharge Instructions  Labs drawn from portacath today   Thank you for choosing [facility] at [facility] to provide your oncology and hematology care.  To afford each patient quality time with our provider, please arrive at least 15 minutes before your scheduled appointment time.   If you have a lab appointment with the Cancer Center please come in thru the Main Entrance and check in at the main information desk.  You need to re-schedule your appointment should you arrive 10 or more minutes late.  We strive to give you quality time with our providers, and arriving late affects you and other patients whose appointments are after yours.  Also, if you no show three or more times for appointments you may be dismissed from the clinic at the providers discretion.     Again, thank you for choosing [facility].  Our hope is that these requests will decrease the amount of time that you wait before being seen by our physicians.       _____________________________________________________________  Should you have questions after your visit to [facility], please contact our office at [phone] and follow the prompts.  Our office hours are 8:00 a.m. and 4:30 p.m. Monday - Friday.  Please note that voicemails left after 4:00 p.m. may not be returned until the following business day.  We are closed weekends and major holidays.  You do have access to a nurse 24-7, just call the main number to the clinic [phone] and do not press any options, hold on the line and a nurse will answer the phone.    For prescription refill requests, have your pharmacy contact our office and allow 72 hours.    Due to Covid, you will need to wear a mask upon entering the hospital. If you do not have a mask, a mask will be given to you at the Main Entrance upon arrival. For doctor visits, patients may have 1 support person age 18  or older with them. For treatment visits, patients can not have anyone with them due to social distancing guidelines and our immunocompromised population.     

## 2020-04-03 NOTE — Progress Notes (Signed)
[place], [place] [address]   CLINIC:  Medical Oncology/Hematology  PCP:  Julian Mad, MD No address on file None   REASON FOR VISIT:  Follow-up for multiple myeloma  PRIOR THERAPY: None  NGS Results: Not done  CURRENT THERAPY: DaraCyBorD & Aloxi weekly  BRIEF ONCOLOGIC HISTORY:  Oncology History  Multiple myeloma ([place])  03/06/2020 Initial Diagnosis   Multiple myeloma ([place])   03/06/2020 Cancer Staging   Staging form: Plasma Cell Myeloma and Plasma Cell Disorders, AJCC 8th Edition - Clinical stage from 03/06/2020: RISS Stage II (Beta-2-microglobulin (mg/L): 4.5, Albumin (g/dL): 2.5, ISS: Stage II, High-risk cytogenetics: Absent, LDH: Elevated) - Signed by Julian Jack, MD on 03/06/2020   03/20/2020 - 03/20/2020 Chemotherapy   The patient had dexamethasone (DECADRON) tablet 40 mg, 40 mg, Oral,  Once, 0 of 4 cycles bortezomib SQ (VELCADE) chemo injection (2.64m/mL concentration) 2.75 mg, 1.3 mg/m2 = 2.75 mg, Subcutaneous,  Once, 0 of 4 cycles  for chemotherapy treatment.    03/20/2020 -  Chemotherapy   The patient had dexamethasone (DECADRON) tablet 40 mg, 40 mg, Oral, Once, 1 of 6 cycles dexamethasone (DECADRON) tablet 40 mg, 40 mg, Oral, Once, 1 of 19 cycles palonosetron (ALOXI) injection 0.25 mg, 0.25 mg, Intravenous,  Once, 1 of 6 cycles Administration: 0.25 mg (03/20/2020), 0.25 mg (03/27/2020) daratumumab-hyaluronidase-fihj (DARZALEX FASPRO) 1800-30000 MG-UT/15ML chemo SQ injection 1,800 mg, 1,800 mg, Subcutaneous,  Once, 1 of 24 cycles Administration: 1,800 mg (03/20/2020), 1,800 mg (03/27/2020) bortezomib SQ (VELCADE) chemo injection (2.587mmL concentration) 2.75 mg, 1.3 mg/m2 = 2.75 mg, Subcutaneous,  Once, 1 of 6 cycles Administration: 2.75 mg (03/20/2020), 2.75 mg (03/27/2020) cyclophosphamide (CYTOXAN) 500 mg in sodium chloride 0.9 % 250 mL chemo infusion, 500 mg (100 % of original dose 500 mg), Intravenous,  Once, 1  of 6 cycles Dose modification: 500 mg (original dose 500 mg, Cycle 1) Administration: 500 mg (03/20/2020), 500 mg (03/27/2020)  for chemotherapy treatment.      CANCER STAGING: Cancer Staging Multiple myeloma ([place]: Plasma Cell Myeloma and Plasma Cell Disorders, AJCC 8th Edition - Clinical stage from 03/06/2020: RISS Stage II (Beta-2-microglobulin (mg/L): 4.5, Albumin (g/dL): 2.5, ISS: Stage II, High-risk cytogenetics: Absent, LDH: Elevated) - Signed by Julian JackMD on 03/06/2020   INTERVAL HISTORY:  [name] [age].o. male, returns for routine follow-up and consideration for next cycle of chemotherapy. [name] last seen by Dr. PrArletha Miranda on 03/20/2020.  Due for day #15 of cycle #1 of DaraCyBorD and Aloxi today.   Today he is accompanied by his wife. Overall, he tells me he has been feeling good. He reports having 2 days of fatigue after his previous treatment and he continues having issues with sleep. He stopped taking melatonin and Restoril since they did not help with his sleep problems. He admits that his headaches and nausea have improved since starting chemo. He takes Compazine every 6 hours and Zofran as needed for nausea. His appetite is not great and his wife has to encourage him to eat, eating about 3 meals a day along with 2 cans of Boost daily. He takes Valium and meclizine when he has a dizzy spell due to his Meniere's. He reports that he blacked out in his kitchen on the evening of 12/25 and collapsed, but came to several seconds later.  He denies abdominal pain, though his legs have been more swollen recently, the right more than the left. He continues taking Lasix  M/W/F.  Overall, he feels ready for next cycle of chemo today.    REVIEW OF SYSTEMS:  Review of Systems  Constitutional: Positive for appetite change (50%) and fatigue (90%).  Cardiovascular: Positive for leg swelling (R > L).  Gastrointestinal: Positive for nausea  (improving). Negative for abdominal pain.  Neurological: Positive for dizziness (d/t Meniere's) and headaches (improving).  All other systems reviewed and are negative.   PAST MEDICAL/SURGICAL HISTORY:  Past Medical History:  Diagnosis Date  . CAD (coronary artery disease)   . GERD (gastroesophageal reflux disease)   . Hypertension   . Myocardial infarction ([place]) 1994  . Peripheral vascular disease ([place])   . Stroke [facility])    Past Surgical History:  Procedure Laterality Date  . BYPASS GRAFT POPLITEAL TO POPLITEAL Right 03/23/2018   Procedure: BYPASS GRAFT RIGHT ABOVE KNEE POPLITEAL TO BELOW KNEE POPLITEAL ARTERY  USING RIGHT GREAT SAPHENOUS VEIN;  Surgeon: Julian Heck, MD;  Location: [place];  Service: Vascular;  Laterality: Right;  . BYPASS GRAFT POPLITEAL TO POPLITEAL Left 04/09/2019   Procedure: BYPASS  ABOVE KNEE POPLITEAL TO BELOW KNEE POPLITEAL AND LIGATION OF LEFT POPITEAL ANEURYSM;  Surgeon: Julian Heck, MD;  Location: [place];  Service: Vascular;  Laterality: Left;  . COLONOSCOPY WITH ESOPHAGOGASTRODUODENOSCOPY (EGD)    . CORONARY ARTERY BYPASS GRAFT    . EYE SURGERY Bilateral    cataracts  . FEMORAL BYPASS Right 03/23/2018  . HERNIA REPAIR    . IR IMAGING GUIDED PORT INSERTION  03/25/2020  . MASTOIDECTOMY    . ROTATOR CUFF REPAIR Right   . TONSILLECTOMY    . VEIN HARVEST Right 03/23/2018   Procedure: VEIN HARVEST RIGHT GREAT SAPHENOUS;  Surgeon: Julian Heck, MD;  Location: [place];  Service: Vascular;  Laterality: Right;  . VEIN HARVEST Left 04/09/2019   Procedure: Harvest Greater Saphenous Vein and  Revise Julian Miranda Saphenous Vein ;  Surgeon: Julian Heck, MD;  Location: [place];  Service: Vascular;  Laterality: Left;    SOCIAL HISTORY:  Social History   Socioeconomic History  . Marital status: Married    Spouse name: Not on file  . Number of children: Not on file  . Years of education: Not on file  . Highest education level: Not on  file  Occupational History  . Not on file  Tobacco Use  . Smoking status: Never Smoker  . Smokeless tobacco: Never Used  Vaping Use  . Vaping Use: Never used  Substance and Sexual Activity  . Alcohol use: Never  . Drug use: Never  . Sexual activity: Not on file  Other Topics Concern  . Not on file  Social History Narrative  . Not on file   Social Determinants of Health   Financial Resource Strain: Low Risk   . Difficulty of Paying Living Expenses: Not hard at all  Food Insecurity: No Food Insecurity  . Worried About Charity fundraiser in the Last Year: Never true  . Ran Out of Food in the Last Year: Never true  Transportation Needs: No Transportation Needs  . Lack of Transportation (Medical): No  . Lack of Transportation (Non-Medical): No  Physical Activity: Insufficiently Active  . Days of Exercise per Week: 2 days  . Minutes of Exercise per Session: 20 min  Stress: Stress Concern Present  . Feeling of Stress : To some extent  Social Connections: Moderately Integrated  . Frequency of Communication with Friends and Family: More than three times a  week  . Frequency of Social Gatherings with Friends and Family: Twice a week  . Attends Religious Services: More than 4 times per year  . Active Member of Clubs or Organizations: No  . Attends Archivist Meetings: Never  . Marital Status: Married  Human resources officer Violence: Not At Risk  . Fear of Current or Ex-Partner: No  . Emotionally Abused: No  . Physically Abused: No  . Sexually Abused: No    FAMILY HISTORY:  Family History  Problem Relation Age of Onset  . Heart disease Mother     CURRENT MEDICATIONS:  Current Outpatient Medications  Medication Sig Dispense Refill  . acetaminophen (TYLENOL) 500 MG tablet Take 500 mg by mouth 2 (two) times daily.     Marland Kitchen acyclovir (ZOVIRAX) 400 MG tablet Take 1 tablet (400 mg total) by mouth 2 (two) times daily. 60 tablet 5  . ALPRAZolam (XANAX) 0.25 MG tablet Take 0.25  mg by mouth in the morning, at noon, in the evening, and at bedtime.     Marland Kitchen amLODipine (NORVASC) 10 MG tablet Take 10 mg by mouth daily.    Marland Kitchen aspirin 81 MG tablet Take 81 mg by mouth daily.    Marland Kitchen atorvastatin (LIPITOR) 80 MG tablet Take 80 mg by mouth at bedtime.     . carvedilol (COREG) 25 MG tablet Take 25 mg by mouth daily.     . diazepam (VALIUM) 2 MG tablet Take 2 mg by mouth daily as needed (dizziness).    . furosemide (LASIX) 20 MG tablet Take 20 mg by mouth every Monday, Wednesday, and Friday.     Marland Kitchen HYDROcodone-acetaminophen (NORCO) 5-325 MG tablet Take 1 tablet by mouth every 8 (eight) hours as needed for moderate pain. 60 tablet 0  . icosapent Ethyl (VASCEPA) 1 g capsule Take 1 g by mouth 2 (two) times daily.    . Iron-Vitamin C (VITRON-C) 65-125 MG TABS Take 1 tablet by mouth daily.    Marland Kitchen lidocaine-prilocaine (EMLA) cream Apply 1 application topically as needed. Apply to portacath as needed 30 g 0  . lisinopril (ZESTRIL) 20 MG tablet Take 20 mg by mouth in the morning and at bedtime.     . Multiple Minerals (CALCIUM/MAGNESIUM/ZINC PO) Take 1 tablet by mouth at bedtime.    Marland Kitchen omeprazole (PRILOSEC) 20 MG capsule Take 20 mg by mouth 2 (two) times daily.     . ondansetron (ZOFRAN) 4 MG tablet Take 4 mg by mouth every 8 (eight) hours as needed for nausea.    . potassium chloride (KLOR-CON) 10 MEQ tablet Take 10 mEq by mouth 2 (two) times daily.     . prochlorperazine (COMPAZINE) 10 MG tablet Take 1 tablet (10 mg total) by mouth every 6 (six) hours as needed for nausea or vomiting. 60 tablet 2  . traMADol (ULTRAM) 50 MG tablet Take 1 tablet (50 mg total) by mouth every 8 (eight) hours as needed for severe pain. 60 tablet 0  . vitamin B-12 (CYANOCOBALAMIN) 1000 MCG tablet Take 1,000 mcg by mouth daily.    Marland Kitchen zinc gluconate 50 MG tablet Take 50 mg by mouth daily.     . meclizine (ANTIVERT) 25 MG tablet Take 1 tablet (25 mg total) by mouth 2 (two) times daily as needed for dizziness. 30 tablet 3    No current facility-administered medications for this visit.    ALLERGIES:  Allergies  Allergen Reactions  . Vancomycin     Other reaction(s): Red Man Syndrome Noted intraoperatively  on 10/04/2018. No associated hemodynamic or respiratory changes. Please give slowly.    PHYSICAL EXAM:  Performance status (ECOG): 1 - Symptomatic but completely ambulatory  Vitals:   04/03/20 0755  BP: 134/84  Pulse: 81  Resp: 18  Temp: (!) 96.4 F (35.8 C)  SpO2: 97%   Wt Readings from Last 3 Encounters:  04/03/20 188 lb 3.2 oz (85.4 kg)  03/27/20 187 lb 8 oz (85 kg)  03/25/20 186 lb (84.4 kg)   Physical Exam Vitals reviewed.  Constitutional:      Appearance: Normal appearance.  Cardiovascular:     Rate and Rhythm: Normal rate and regular rhythm.     Pulses: Normal pulses.     Heart sounds: Normal heart sounds.  Pulmonary:     Effort: Pulmonary effort is normal.     Breath sounds: Normal breath sounds.  Chest:     Comments: Port-a-Cath in R chest Abdominal:     Palpations: Abdomen is soft. There is no mass.     Tenderness: There is no abdominal tenderness.  Musculoskeletal:     Right lower leg: Edema (2+) present.     Left lower leg: Edema (1+) present.  Neurological:     General: No focal deficit present.     Mental Status: He is alert and oriented to person, place, and time.  Psychiatric:        Mood and Affect: Mood normal.        Behavior: Behavior normal.     LABORATORY DATA:  I have reviewed the labs as listed.  CBC Latest Ref Rng & Units 04/03/2020 03/27/2020 03/20/2020  WBC 4.0 - 10.5 K/uL 10.9(H) 13.7(H) 16.0(H)  Hemoglobin 13.0 - 17.0 g/dL 14.6 15.3 17.0  Hematocrit 39.0 - 52.0 % 43.3 46.4 51.9  Platelets 150 - 400 K/uL 106(L) 168 170   CMP Latest Ref Rng & Units 04/03/2020 03/27/2020 03/20/2020  Glucose 70 - 99 mg/dL 99 99 104(H)  BUN 8 - 23 mg/dL 31(H) 32(H) 33(H)  Creatinine 0.61 - 1.24 mg/dL 1.55(H) 1.84(H) 2.12(H)  Sodium 135 - 145 mmol/L 135 135  132(L)  Potassium 3.5 - 5.1 mmol/L 4.2 4.1 4.6  Chloride 98 - 111 mmol/L 100 102 100  CO2 22 - 32 mmol/L _0 Calcium 8.9 - 10.3 mg/dL 8.0(L) 7.5(L) 8.4(L)  Total Protein 6.5 - 8.1 g/dL 4.6(L) 4.7(L) 5.3(L)  Total Bilirubin 0.3 - 1.2 mg/dL 0.6 0.4 0.5  Alkaline Phos 38 - 126 U/L 116 150(H) 124  AST 15 - 41 U/L _1 ALT 0 - 44 U/L _2 Lab Results  Component Value Date   TOTALPROTELP 5.0 (L) 02/26/2020   TOTALPROTELP 5.1 (L) 02/26/2020   ALBUMINELP 2.2 (L) 02/26/2020   A1GS 0.3 02/26/2020   A2GS 1.4 (H) 02/26/2020   BETS 0.7 02/26/2020   GAMS 0.6 02/26/2020   MSPIKE 0.1 (H) 02/26/2020   SPEI Comment 02/26/2020    Lab Results  Component Value Date   KPAFRELGTCHN LIPG 03/06/2020   LAMBDASER NOT PERFORMED 03/06/2020   KAPLAMBRATIO 0.10 (L) 02/26/2020    DIAGNOSTIC IMAGING:  I have independently reviewed the scans and discussed with the patient. IR IMAGING GUIDED PORT INSERTION  Result Date: 03/25/2020 INDICATION: 42-year-old male with history of multiple myeloma requiring central venous access for chemotherapy. EXAM: IMPLANTED PORT A CATH PLACEMENT WITH ULTRASOUND AND FLUOROSCOPIC GUIDANCE COMPARISON:  None. MEDICATIONS: Ancef 2 gm IV; The antibiotic was administered within an appropriate time interval prior to  skin puncture. ANESTHESIA/SEDATION: Moderate (conscious) sedation was employed during this procedure. A total of Versed 1 mg and Fentanyl 50 mcg was administered intravenously. Moderate Sedation Time: 20 minutes. The patient's level of consciousness and vital signs were monitored continuously by radiology nursing throughout the procedure under my direct supervision. CONTRAST:  None FLUOROSCOPY TIME:  0 minutes, 6 seconds (2 mGy) COMPLICATIONS: None immediate. PROCEDURE: The procedure, risks, benefits, and alternatives were explained to the patient. Questions regarding the procedure were encouraged and answered. The patient understands and consents to the  procedure. The right neck and chest were prepped with chlorhexidine in a sterile fashion, and a sterile drape was applied covering the operative field. Maximum barrier sterile technique with sterile gowns and gloves were used for the procedure. A timeout was performed prior to the initiation of the procedure. Ultrasound was used to examine the jugular vein which was compressible and free of internal echoes. A skin marker was used to demarcate the planned venotomy and port pocket incision sites. Local anesthesia was provided to these sites and the subcutaneous tunnel track with 1% lidocaine with 1:100,000 epinephrine. A small incision was created at the jugular access site and blunt dissection was performed of the subcutaneous tissues. Under real time ultrasound guidance, the jugular vein was accessed with a 21 ga micropuncture needle and an 0.018" wire was inserted to the superior vena cava. A 5 Fr micopuncture set was then used, through which a 0.035" Rosen wire was passed under fluoroscopic guidance into the inferior vena cava. An 8 Fr dilator was then placed over the wire. A subcutaneous port pocket was then created along the upper chest wall utilizing a combination of sharp and blunt dissection. The pocket was irrigated with sterile saline, packed with gauze, and observed for hemorrhage. A single lumen "ISP" sized power injectable port was chosen for placement. The 8 Fr catheter was tunneled from the port pocket site to the venotomy incision. The port was placed in the pocket. The external catheter was trimmed to appropriate length. The dilator was exchanged for an 8 Fr peel-away sheath under fluoroscopic guidance. The catheter was then placed through the sheath and the sheath was removed. Final catheter positioning was confirmed and documented with a fluoroscopic spot radiograph. The port was accessed with a Huber needle, aspirated, and flushed with heparinized saline. The deep dermal layer of the port pocket  incision was closed with interrupted 3-0 Vicryl suture. The skin was opposed with a running subcuticular 4-0 Monocryl suture. Dermabond was then placed over the port pocket and neck incisions. The patient tolerated the procedure well without immediate post procedural complication. FINDINGS: After catheter placement, the tip lies within the cavoatrial junction. The catheter aspirates and flushes normally and is ready for immediate use. IMPRESSION: Successful placement of a power injectable Port-A-Cath via the right internal jugular vein. The catheter is ready for immediate use. Ruthann Cancer, MD [facility] Radiology Electronically Signed   By: Ruthann Cancer MD   On: 03/25/2020 16:04     ASSESSMENT:  1. 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 Joylene Grapes at [facility]-SPEP 0.2 g, immunofixation with biclonal IgG with lambda specificity. -Free kappa light chain 16.7, lambda light chains 122.7, ratio 0.14. -Kidney biopsy on 02/04/2020 with AL amyloidosis, lambda immunophenotype, Congo red stain positive. Immunofluorescence microscopy shows dense homogeneous glomerular mesangial staining with antisera specific for IgG 1-2+, IgM 2+, C1q 2+, lambda light  chains 3+. Waxy homogeneous staining noted along the blood vessels. Congo red stain demonstrates positive apocrine birefringence within the glomerular mesangial regions and along blood vessel walls. -48 pound weight loss since summer 2021 with decreased appetite. Positive for fatigue. -Reports occasional nausea, no vomiting. Positive for occipital headaches. -Denies any tingling or numbness in the extremities. He has cramps in the left arm which is new. He had leg cramps for a long time. -Cardiac MRI on 02/29/2020 with EF 38%, no evidence of amyloid deposits in the myocardium. -PET scan on 02/26/2020  with scattered borderline mediastinal and hilar lymph nodes with low-level hypermetabolism most likely inflammatory or reactive.  No bone abnormalities.  No findings in the abdomen or pelvis. -Bone marrow biopsy on 02/26/2020 with slightly hypercellular bone marrow with 23% cells consistent with plasma cells, staining for lambda light chain.  No lymphoproliferative disorder.  Congo red stain was negative. -Bone marrow cytogenetics were normal.  FISH panel for multiple myeloma was negative. -M spike was 0.1 g, immunofixation shows IgG lambda.  LDH was elevated at 216.  Beta-2 microglobulin 4.5. -Dara-CyBorD started on 03/20/2020.  2. Social/family history: -He works for [employer] and retired 5 years ago. Never smoker. -Maternal aunt had cancer in male organs. One maternal first cousin had appendiceal cancer, another maternal first cousin had cancer of the digestive system.   PLAN:  1.  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  He has tolerated first 2 weekly treatments reasonably well.  He had some fatigue. -Reviewed his labs which showed improvement in his creatinine to 1.55.  Albumin is still low at 2.2.  CBC showed slightly decreased platelet count of 106, treatment related. -We will proceed with his next treatment today.  Reevaluate in 1 week.  2. Nausea: -Compazine 10 mg as needed.  Overall nausea improved since treatment started.  3. Posterior headaches: -He has a history of Mnire's disease in the past.  Headaches have improved since treatment started.  4.  Sleeping difficulty: -We tried Restoril 15 mg which did not help. -We will start him on Belsomra 10 mg at bedtime.  Will titrate as needed.   Orders placed this encounter:  No orders of the defined types were placed in this encounter.    Julian Jack, MD [place] [phone]   I, Milinda Antis, am acting as a scribe for Dr. Sanda Linger.  I, Julian Jack MD, have reviewed the above documentation for accuracy and completeness, and I agree with the above.

## 2020-04-03 NOTE — Progress Notes (Signed)
Julian Miranda presents today for D15C1 CyBorD. Pt denies any new changes or symptoms since last treatment. Lab results, including Cr 1.55, and vitals have been reviewed and are stable and within parameters for treatment. Patient has been assessed by Dr. Ellin Saba who has approved proceeding with treatment today as planned.  Infusions and injections tolerated without incident or complaint. VSS upon completion of treatment. Port flushed and deaccessed per protocol, see MAR and IV flowsheet for details. Discharged in satisfactory condition with follow up instructions.

## 2020-04-03 NOTE — Patient Instructions (Signed)
[facility] Discharge Instructions for Patients Receiving Chemotherapy   Beginning January 23rd 2017 lab work for the [facility] will be done in the  Main lab at [facility] on 1st floor. If you have a lab appointment with the Cancer Center please come in thru the  Main Entrance and check in at the main information desk   Today you received the following chemotherapy agents Cytoxan, Velcade, and Daratumumab  To help prevent nausea and vomiting after your treatment, we encourage you to take your nausea medication    If you develop nausea and vomiting, or diarrhea that is not controlled by your medication, call the clinic.  The clinic phone number is [phone]. Office hours are Monday-Friday 8:30am-5:00pm.  BELOW ARE SYMPTOMS THAT SHOULD BE REPORTED IMMEDIATELY:  *FEVER GREATER THAN 101.0 F  *CHILLS WITH OR WITHOUT FEVER  NAUSEA AND VOMITING THAT IS NOT CONTROLLED WITH YOUR NAUSEA MEDICATION  *UNUSUAL SHORTNESS OF BREATH  *UNUSUAL BRUISING OR BLEEDING  TENDERNESS IN MOUTH AND THROAT WITH OR WITHOUT PRESENCE OF ULCERS  *URINARY PROBLEMS  *BOWEL PROBLEMS  UNUSUAL RASH Items with * indicate a potential emergency and should be followed up as soon as possible. If you have an emergency after office hours please contact your primary care physician or go to the nearest emergency department.  Please call the clinic during office hours if you have any questions or concerns.   You may also contact the Patient Navigator at [phone] should you have any questions or need assistance in obtaining follow up care.      Resources For Cancer Patients and their Caregivers ? American Cancer Society: Can assist with transportation, wigs, general needs, runs Look Good Feel Better.        (548)306-9059 ? Cancer Care: Provides financial assistance, online support groups, medication/co-pay assistance.  1-800-813-HOPE 805-311-3394) ? [facility] Assists [place] cancer patients and their families through emotional , educational and financial support.  913-787-2375 ? [place] DSS Where to apply for food stamps, Medicaid and utility assistance. 2284527450 ? [facility]: Transportation to medical appointments. 252 226 4335 ? Social Security Administration: May apply for disability if have a Stage IV cancer. 5874372653 [phone] ? [employer], Disability and Transit Services: Assists with nutrition, care and transit needs. [phone]

## 2020-04-03 NOTE — Patient Instructions (Addendum)
[facility] at [facility] Discharge Instructions  You were seen today by Dr. Ellin Saba. He went over your recent results. You received your treatment today. Take Lasix daily if your feet become more swollen, otherwise take it on Monday, Wednesday and Friday. You will be prescribed Ambien (zolpidem) to take at bedtime to help you sleep. Dr. Ellin Saba will see you back in 1 week for labs and follow up.   Thank you for choosing [facility] at [facility] to provide your oncology and hematology care.  To afford each patient quality time with our provider, please arrive at least 15 minutes before your scheduled appointment time.   If you have a lab appointment with the Cancer Center please come in thru the Main Entrance and check in at the main information desk  You need to re-schedule your appointment should you arrive 10 or more minutes late.  We strive to give you quality time with our providers, and arriving late affects you and other patients whose appointments are after yours.  Also, if you no show three or more times for appointments you may be dismissed from the clinic at the providers discretion.     Again, thank you for choosing [facility].  Our hope is that these requests will decrease the amount of time that you wait before being seen by our physicians.       _____________________________________________________________  Should you have questions after your visit to [facility], please contact our office at [phone] between the hours of 8:00 a.m. and 4:30 p.m.  Voicemails left after 4:00 p.m. will not be returned until the following business day.  For prescription refill requests, have your pharmacy contact our office and allow 72 hours.    Cancer Center Support Programs:   > Cancer Support Group  2nd Tuesday of the month 1pm-2pm, Journey Room

## 2020-04-04 LAB — IGG, IGA, IGM
IgA: 45 mg/dL — ABNORMAL LOW (ref 61–437)
IgG (Immunoglobin G), Serum: 375 mg/dL — ABNORMAL LOW (ref 603–1613)
IgM (Immunoglobulin M), Srm: 104 mg/dL (ref 20–172)

## 2020-04-07 LAB — PROTEIN ELECTROPHORESIS, SERUM
A/G Ratio: 1 (ref 0.7–1.7)
Albumin ELP: 2.2 g/dL — ABNORMAL LOW (ref 2.9–4.4)
Alpha-1-Globulin: 0.2 g/dL (ref 0.0–0.4)
Alpha-2-Globulin: 1.1 g/dL — ABNORMAL HIGH (ref 0.4–1.0)
Beta Globulin: 0.5 g/dL — ABNORMAL LOW (ref 0.7–1.3)
Gamma Globulin: 0.4 g/dL (ref 0.4–1.8)
Globulin, Total: 2.2 g/dL (ref 2.2–3.9)
M-Spike, %: 0.1 g/dL — ABNORMAL HIGH
Total Protein ELP: 4.4 g/dL — ABNORMAL LOW (ref 6.0–8.5)

## 2020-04-07 MED ORDER — BELSOMRA 10 MG PO TABS: 10.0000 mg | ORAL_TABLET | Freq: Every evening | ORAL | 0 refills | Status: DC | PRN

## 2020-04-10 ENCOUNTER — Inpatient Hospital Stay (HOSPITAL_COMMUNITY): Payer: Medicare Other | Attending: Hematology | Admitting: Hematology

## 2020-04-10 ENCOUNTER — Inpatient Hospital Stay (HOSPITAL_COMMUNITY): Payer: Medicare Other

## 2020-04-10 ENCOUNTER — Other Ambulatory Visit: Payer: Self-pay

## 2020-04-10 VITALS — BP 134/78 | HR 81 | Temp 97.0°F | Resp 18

## 2020-04-10 VITALS — BP 141/86 | HR 83 | Temp 97.0°F | Resp 18 | Wt 187.0 lb

## 2020-04-10 DIAGNOSIS — C9000 Multiple myeloma not having achieved remission: Secondary | ICD-10-CM

## 2020-04-10 DIAGNOSIS — M7989 Other specified soft tissue disorders: Secondary | ICD-10-CM | POA: Insufficient documentation

## 2020-04-10 DIAGNOSIS — I2510 Atherosclerotic heart disease of native coronary artery without angina pectoris: Secondary | ICD-10-CM | POA: Diagnosis not present

## 2020-04-10 DIAGNOSIS — Z8673 Personal history of transient ischemic attack (TIA), and cerebral infarction without residual deficits: Secondary | ICD-10-CM | POA: Insufficient documentation

## 2020-04-10 DIAGNOSIS — R110 Nausea: Secondary | ICD-10-CM | POA: Diagnosis not present

## 2020-04-10 DIAGNOSIS — Z5111 Encounter for antineoplastic chemotherapy: Secondary | ICD-10-CM | POA: Insufficient documentation

## 2020-04-10 DIAGNOSIS — I252 Old myocardial infarction: Secondary | ICD-10-CM | POA: Insufficient documentation

## 2020-04-10 DIAGNOSIS — I739 Peripheral vascular disease, unspecified: Secondary | ICD-10-CM | POA: Diagnosis not present

## 2020-04-10 DIAGNOSIS — Z5112 Encounter for antineoplastic immunotherapy: Secondary | ICD-10-CM | POA: Diagnosis not present

## 2020-04-10 DIAGNOSIS — R519 Headache, unspecified: Secondary | ICD-10-CM | POA: Diagnosis not present

## 2020-04-10 DIAGNOSIS — K219 Gastro-esophageal reflux disease without esophagitis: Secondary | ICD-10-CM | POA: Insufficient documentation

## 2020-04-10 DIAGNOSIS — Z79899 Other long term (current) drug therapy: Secondary | ICD-10-CM | POA: Insufficient documentation

## 2020-04-10 LAB — COMPREHENSIVE METABOLIC PANEL
ALT: 11 U/L (ref 0–44)
AST: 13 U/L — ABNORMAL LOW (ref 15–41)
Albumin: 2.1 g/dL — ABNORMAL LOW (ref 3.5–5.0)
Alkaline Phosphatase: 112 U/L (ref 38–126)
Anion gap: 10 (ref 5–15)
BUN: 25 mg/dL — ABNORMAL HIGH (ref 8–23)
CO2: 24 mmol/L (ref 22–32)
Calcium: 8.2 mg/dL — ABNORMAL LOW (ref 8.9–10.3)
Chloride: 102 mmol/L (ref 98–111)
Creatinine, Ser: 1.57 mg/dL — ABNORMAL HIGH (ref 0.61–1.24)
GFR, Estimated: 47 mL/min — ABNORMAL LOW (ref >60)
Glucose, Bld: 90 mg/dL (ref 70–99)
Potassium: 4.4 mmol/L (ref 3.5–5.1)
Sodium: 136 mmol/L (ref 135–145)
Total Bilirubin: 0.7 mg/dL (ref 0.3–1.2)
Total Protein: 4.6 g/dL — ABNORMAL LOW (ref 6.5–8.1)

## 2020-04-10 LAB — CBC WITH DIFFERENTIAL/PLATELET
Abs Immature Granulocytes: 0.08 10*3/uL — ABNORMAL HIGH (ref 0.00–0.07)
Basophils Absolute: 0 10*3/uL (ref 0.0–0.1)
Basophils Relative: 0 %
Eosinophils Absolute: 0.1 10*3/uL (ref 0.0–0.5)
Eosinophils Relative: 1 %
HCT: 42.7 % (ref 39.0–52.0)
Hemoglobin: 14.2 g/dL (ref 13.0–17.0)
Immature Granulocytes: 1 %
Lymphocytes Relative: 16 %
Lymphs Abs: 1.7 10*3/uL (ref 0.7–4.0)
MCH: 31.1 pg (ref 26.0–34.0)
MCHC: 33.3 g/dL (ref 30.0–36.0)
MCV: 93.4 fL (ref 80.0–100.0)
Monocytes Absolute: 0.7 10*3/uL (ref 0.1–1.0)
Monocytes Relative: 6 %
Neutro Abs: 8 10*3/uL — ABNORMAL HIGH (ref 1.7–7.7)
Neutrophils Relative %: 76 %
Platelets: 106 10*3/uL — ABNORMAL LOW (ref 150–400)
RBC: 4.57 MIL/uL (ref 4.22–5.81)
RDW: 18.2 % — ABNORMAL HIGH (ref 11.5–15.5)
WBC: 10.6 10*3/uL — ABNORMAL HIGH (ref 4.0–10.5)
nRBC: 0 % (ref 0.0–0.2)

## 2020-04-10 MED ORDER — DIPHENHYDRAMINE HCL 25 MG PO CAPS
50.0000 mg | ORAL_CAPSULE | Freq: Once | ORAL | Status: AC
  Administered 2020-04-10: 50 mg via ORAL

## 2020-04-10 MED ORDER — HEPARIN SOD (PORK) LOCK FLUSH 100 UNIT/ML IV SOLN
500.0000 [IU] | Freq: Once | INTRAVENOUS | Status: AC
  Administered 2020-04-10: 500 [IU] via INTRAVENOUS

## 2020-04-10 MED ORDER — DIPHENHYDRAMINE HCL 25 MG PO CAPS
ORAL_CAPSULE | ORAL | Status: AC
  Filled 2020-04-10: qty 2

## 2020-04-10 MED ORDER — ACETAMINOPHEN 325 MG PO TABS
ORAL_TABLET | ORAL | Status: AC
  Filled 2020-04-10: qty 2

## 2020-04-10 MED ORDER — DEXAMETHASONE 4 MG PO TABS: 40.0000 mg | ORAL_TABLET | Freq: Once | ORAL | Status: DC

## 2020-04-10 MED ORDER — SODIUM CHLORIDE 0.9 % IV SOLN
500.0000 mg | Freq: Once | INTRAVENOUS | Status: AC
Start: 2020-04-10 — End: 2020-04-10
  Administered 2020-04-10: 500 mg via INTRAVENOUS
  Filled 2020-04-10: qty 25

## 2020-04-10 MED ORDER — DARATUMUMAB-HYALURONIDASE-FIHJ 1800-30000 MG-UT/15ML ~~LOC~~ SOLN
1800.0000 mg | Freq: Once | SUBCUTANEOUS | Status: AC
  Administered 2020-04-10: 1800 mg via SUBCUTANEOUS
  Filled 2020-04-10: qty 15

## 2020-04-10 MED ORDER — SODIUM CHLORIDE 0.9 % IV SOLN
40.0000 mg | Freq: Once | INTRAVENOUS | Status: AC
  Administered 2020-04-10: 40 mg via INTRAVENOUS
  Filled 2020-04-10: qty 4

## 2020-04-10 MED ORDER — PALONOSETRON HCL INJECTION 0.25 MG/5ML
0.2500 mg | Freq: Once | INTRAVENOUS | Status: AC
  Administered 2020-04-10: 0.25 mg via INTRAVENOUS

## 2020-04-10 MED ORDER — PALONOSETRON HCL INJECTION 0.25 MG/5ML
INTRAVENOUS | Status: AC
  Filled 2020-04-10: qty 5

## 2020-04-10 MED ORDER — ACETAMINOPHEN 325 MG PO TABS
650.0000 mg | ORAL_TABLET | Freq: Once | ORAL | Status: AC
  Administered 2020-04-10: 650 mg via ORAL

## 2020-04-10 MED ORDER — ESZOPICLONE 1 MG PO TABS: 1.0000 mg | ORAL_TABLET | Freq: Every evening | ORAL | 0 refills | Status: DC | PRN

## 2020-04-10 MED ORDER — SODIUM CHLORIDE 0.9 % IV SOLN: INTRAVENOUS | Status: DC

## 2020-04-10 MED ORDER — BORTEZOMIB CHEMO SQ INJECTION 3.5 MG (2.5MG/ML)
1.3000 mg/m2 | Freq: Once | INTRAMUSCULAR | Status: AC
  Administered 2020-04-10: 2.75 mg via SUBCUTANEOUS
  Filled 2020-04-10: qty 1.1

## 2020-04-10 MED ORDER — SODIUM CHLORIDE 0.9% FLUSH
10.0000 mL | Freq: Once | INTRAVENOUS | Status: AC
  Administered 2020-04-10: 10 mL via INTRAVENOUS

## 2020-04-10 NOTE — Progress Notes (Signed)
Patients port flushed without difficulty.  Good blood return noted with no bruising or swelling noted at site.  Transparent dressing applied.  Patient remains accessed for chemotherapy treatment.  

## 2020-04-10 NOTE — Progress Notes (Signed)
[address] [place], [place] [address]   CLINIC:  Medical Oncology/Hematology  PCP:  Lonia Mad, MD No address on file None   REASON FOR VISIT:  Follow-up for multiple myeloma  PRIOR THERAPY: None  NGS Results: Not done  CURRENT THERAPY: DaraCyBorD & Aloxi weekly  BRIEF ONCOLOGIC HISTORY:  Oncology History  Multiple myeloma ([place])  03/06/2020 Initial Diagnosis   Multiple myeloma ([place])   03/06/2020 Cancer Staging   Staging form: Plasma Cell Myeloma and Plasma Cell Disorders, AJCC 8th Edition - Clinical stage from 03/06/2020: RISS Stage II (Beta-2-microglobulin (mg/L): 4.5, Albumin (g/dL): 2.5, ISS: Stage II, High-risk cytogenetics: Absent, LDH: Elevated) - Signed by Derek Jack, MD on 03/06/2020   03/20/2020 - 03/20/2020 Chemotherapy   The patient had dexamethasone (DECADRON) tablet 40 mg, 40 mg, Oral,  Once, 0 of 4 cycles bortezomib SQ (VELCADE) chemo injection (2.1m/mL concentration) 2.75 mg, 1.3 mg/m2 = 2.75 mg, Subcutaneous,  Once, 0 of 4 cycles  for chemotherapy treatment.    03/20/2020 -  Chemotherapy   The patient had dexamethasone (DECADRON) tablet 40 mg, 40 mg, Oral, Once, 1 of 6 cycles dexamethasone (DECADRON) tablet 40 mg, 40 mg, Oral, Once, 1 of 19 cycles palonosetron (ALOXI) injection 0.25 mg, 0.25 mg, Intravenous,  Once, 1 of 6 cycles Administration: 0.25 mg (03/20/2020), 0.25 mg (03/27/2020), 0.25 mg (04/03/2020) daratumumab-hyaluronidase-fihj (DARZALEX FASPRO) 1800-30000 MG-UT/15ML chemo SQ injection 1,800 mg, 1,800 mg, Subcutaneous,  Once, 1 of 24 cycles Administration: 1,800 mg (03/20/2020), 1,800 mg (03/27/2020), 1,800 mg (04/03/2020) bortezomib SQ (VELCADE) chemo injection (2.570mmL concentration) 2.75 mg, 1.3 mg/m2 = 2.75 mg, Subcutaneous,  Once, 1 of 6 cycles Administration: 2.75 mg (03/20/2020), 2.75 mg (03/27/2020), 2.75 mg (04/03/2020) cyclophosphamide (CYTOXAN) 500 mg in sodium chloride 0.9 % 250 mL chemo  infusion, 500 mg (100 % of original dose 500 mg), Intravenous,  Once, 1 of 6 cycles Dose modification: 500 mg (original dose 500 mg, Cycle 1) Administration: 500 mg (03/20/2020), 500 mg (03/27/2020), 500 mg (04/03/2020)  for chemotherapy treatment.      CANCER STAGING: Cancer Staging Multiple [place] ([place]: Plasma Cell Myeloma and Plasma Cell Disorders, AJCC 8th Edition - Clinical stage from 03/06/2020: RISS Stage II (Beta-2-microglobulin (mg/L): 4.5, Albumin (g/dL): 2.5, ISS: Stage II, High-risk cytogenetics: Absent, LDH: Elevated) - Signed by KaDerek JackMD on 03/06/2020   INTERVAL HISTORY:  [name] 70  o. male, returns for routine follow-up and consideration for next cycle of chemotherapy. TiAbdullaas last seen on 04/03/2020.  Due for day #22 of cycle #1 of DaraCyBorD and Aloxi today.   Today he is accompanied by his wife. Overall, he tells me he has been feeling okay. He denies having any nosebleeds, hematochezia, hematuria, lightheadedness, CP or fainting spells. He has not started taking Belsomra since it is too expensive and not covered by his insurance. He reports having fatigue the day after getting his treatments, but otherwise he is tolerating it well. His wife reports that he had 2 episodes of daytime sleeping on 1/5 which is new. His appetite is okay and he is forcing himself to eat, but denies N/V; he continues taking Compazine. He drinks 1-2 cans of Boost, 1 high-calorie and 1 high-protein. He denies having numbness.  He will see the nutritionist on 1/7.  Overall, he feels ready for next cycle of chemo today.    REVIEW OF SYSTEMS:  Review of Systems  Constitutional: Positive for appetite change (71%) and fatigue (71%).  HENT:  Negative for nosebleeds.   Cardiovascular: Positive for leg swelling (R > L leg swelling). Negative for chest pain.  Gastrointestinal: Negative for blood in stool, nausea and vomiting.  Genitourinary: Negative  for hematuria.   Neurological: Negative for dizziness, light-headedness and numbness.  Hematological: Bruises/bleeds easily (bruising around port).  All other systems reviewed and are negative.   PAST MEDICAL/SURGICAL HISTORY:  Past Medical History:  Diagnosis Date  . CAD (coronary artery disease)   . GERD (gastroesophageal reflux disease)   . Hypertension   . Myocardial infarction ([place]) 1994  . Peripheral vascular disease ([place])   . Stroke [facility])    Past Surgical History:  Procedure Laterality Date  . BYPASS GRAFT POPLITEAL TO POPLITEAL Right 03/23/2018   Procedure: BYPASS GRAFT RIGHT ABOVE KNEE POPLITEAL TO BELOW KNEE POPLITEAL ARTERY  USING RIGHT GREAT SAPHENOUS VEIN;  Surgeon: Marty Heck, MD;  Location: [place];  Service: Vascular;  Laterality: Right;  . BYPASS GRAFT POPLITEAL TO POPLITEAL Left 04/09/2019   Procedure: BYPASS  ABOVE KNEE POPLITEAL TO BELOW KNEE POPLITEAL AND LIGATION OF LEFT POPITEAL ANEURYSM;  Surgeon: Marty Heck, MD;  Location: [place];  Service: Vascular;  Laterality: Left;  . COLONOSCOPY WITH ESOPHAGOGASTRODUODENOSCOPY (EGD)    . CORONARY ARTERY BYPASS GRAFT    . EYE SURGERY Bilateral    cataracts  . FEMORAL BYPASS Right 03/23/2018  . HERNIA REPAIR    . IR IMAGING GUIDED PORT INSERTION  03/25/2020  . MASTOIDECTOMY    . ROTATOR CUFF REPAIR Right   . TONSILLECTOMY    . VEIN HARVEST Right 03/23/2018   Procedure: VEIN HARVEST RIGHT GREAT SAPHENOUS;  Surgeon: Marty Heck, MD;  Location: [place];  Service: Vascular;  Laterality: Right;  . VEIN HARVEST Left 04/09/2019   Procedure: Harvest Greater Saphenous Vein and  Revise Elisa Lateral Saphenous Vein ;  Surgeon: Marty Heck, MD;  Location: [place];  Service: Vascular;  Laterality: Left;    SOCIAL HISTORY:  Social History   Socioeconomic History  . Marital status: Married    Spouse name: Not on file  . Number of children: Not on file  . Years of education: Not on file  . Highest  education level: Not on file  Occupational History  . Not on file  Tobacco Use  . Smoking status: Never Smoker  . Smokeless tobacco: Never Used  Vaping Use  . Vaping Use: Never used  Substance and Sexual Activity  . Alcohol use: Never  . Drug use: Never  . Sexual activity: Not on file  Other Topics Concern  . Not on file  Social History Narrative  . Not on file   Social Determinants of Health   Financial Resource Strain: Low Risk   . Difficulty of Paying Living Expenses: Not hard at all  Food Insecurity: No Food Insecurity  . Worried About Charity fundraiser in the Last Year: Never true  . Ran Out of Food in the Last Year: Never true  Transportation Needs: No Transportation Needs  . Lack of Transportation (Medical): No  . Lack of Transportation (Non-Medical): No  Physical Activity: Insufficiently Active  . Days of Exercise per Week: 2 days  . Minutes of Exercise per Session: 20 min  Stress: Stress Concern Present  . Feeling of Stress : To some extent  Social Connections: Moderately Integrated  . Frequency of Communication with Friends and Family: More than three times a week  . Frequency of Social Gatherings with Friends and Family:  Twice a week  . Attends Religious Services: More than 4 times per year  . Active Member of Clubs or Organizations: No  . Attends Archivist Meetings: Never  . Marital Status: Married  Human resources officer Violence: Not At Risk  . Fear of Current or Ex-Partner: No  . Emotionally Abused: No  . Physically Abused: No  . Sexually Abused: No    FAMILY HISTORY:  Family History  Problem Relation Age of Onset  . Heart disease Mother     CURRENT MEDICATIONS:  Current Outpatient Medications  Medication Sig Dispense Refill  . acetaminophen (TYLENOL) 500 MG tablet Take 500 mg by mouth 2 (two) times daily.     Marland Kitchen acyclovir (ZOVIRAX) 400 MG tablet Take 1 tablet (400 mg total) by mouth 2 (two) times daily. 60 tablet 5  . ALPRAZolam (XANAX)  0.25 MG tablet Take 0.25 mg by mouth in the morning, at noon, in the evening, and at bedtime.     Marland Kitchen amLODipine (NORVASC) 10 MG tablet Take 10 mg by mouth daily.    Marland Kitchen aspirin 81 MG tablet Take 81 mg by mouth daily.    Marland Kitchen atorvastatin (LIPITOR) 80 MG tablet Take 80 mg by mouth at bedtime.     . carvedilol (COREG) 25 MG tablet Take 25 mg by mouth daily.     . diazepam (VALIUM) 2 MG tablet Take 2 mg by mouth daily as needed (dizziness).    . furosemide (LASIX) 20 MG tablet Take 20 mg by mouth every Monday, Wednesday, and Friday.     Marland Kitchen HYDROcodone-acetaminophen (NORCO) 5-325 MG tablet Take 1 tablet by mouth every 8 (eight) hours as needed for moderate pain. 60 tablet 0  . icosapent Ethyl (VASCEPA) 1 g capsule Take 1 g by mouth 2 (two) times daily.    . Iron-Vitamin C (VITRON-C) 65-125 MG TABS Take 1 tablet by mouth daily.    Marland Kitchen lidocaine-prilocaine (EMLA) cream Apply 1 application topically as needed. Apply to portacath as needed 30 g 0  . lisinopril (ZESTRIL) 20 MG tablet Take 20 mg by mouth in the morning and at bedtime.     . meclizine (ANTIVERT) 25 MG tablet Take 1 tablet (25 mg total) by mouth 2 (two) times daily as needed for dizziness. 30 tablet 3  . Multiple Minerals (CALCIUM/MAGNESIUM/ZINC PO) Take 1 tablet by mouth at bedtime.    Marland Kitchen omeprazole (PRILOSEC) 20 MG capsule Take 20 mg by mouth 2 (two) times daily.     . ondansetron (ZOFRAN) 4 MG tablet Take 4 mg by mouth every 8 (eight) hours as needed for nausea.    . potassium chloride (KLOR-CON) 10 MEQ tablet Take 10 mEq by mouth 2 (two) times daily.     . prochlorperazine (COMPAZINE) 10 MG tablet Take 1 tablet (10 mg total) by mouth every 6 (six) hours as needed for nausea or vomiting. 60 tablet 2  . sildenafil (REVATIO) 20 MG tablet Take 20 mg by mouth daily.    . Suvorexant (BELSOMRA) 10 MG TABS Take 10 mg by mouth at bedtime as needed for up to 10 days. 10 tablet 0  . traMADol (ULTRAM) 50 MG tablet Take 1 tablet (50 mg total) by mouth every 8  (eight) hours as needed for severe pain. 60 tablet 0  . vitamin B-12 (CYANOCOBALAMIN) 1000 MCG tablet Take 1,000 mcg by mouth daily.    Marland Kitchen zinc gluconate 50 MG tablet Take 50 mg by mouth daily.      No current  facility-administered medications for this visit.    ALLERGIES:  Allergies  Allergen Reactions  . Vancomycin     Other reaction(s): Red Man Syndrome Noted intraoperatively on 10/04/2018. No associated hemodynamic or respiratory changes. Please give slowly.    PHYSICAL EXAM:  Performance status (ECOG): 1 - Symptomatic but completely ambulatory  Vitals:   04/10/20 0805  BP: (!) 141/86  Pulse: 83  Resp: 18  Temp: (!) 97 F (36.1 C)  SpO2: 97%   Wt Readings from Last 3 Encounters:  04/10/20 187 lb (84.8 kg)  04/03/20 188 lb 3.2 oz (85.4 kg)  03/27/20 187 lb 8 oz (85 kg)   Physical Exam Vitals reviewed.  Constitutional:      Appearance: Normal appearance.  Cardiovascular:     Rate and Rhythm: Normal rate and regular rhythm.     Pulses: Normal pulses.     Heart sounds: Normal heart sounds.  Pulmonary:     Effort: Pulmonary effort is normal.     Breath sounds: Normal breath sounds.  Chest:     Comments: Port-a-Cath in R chest Musculoskeletal:     Right lower leg: Edema (2+) present.     Left lower leg: Edema (1+) present.  Lymphadenopathy:     Cervical: No cervical adenopathy.  Neurological:     General: No focal deficit present.     Mental Status: He is alert and oriented to person, place, and time.  Psychiatric:        Mood and Affect: Mood normal.        Behavior: Behavior normal.     LABORATORY DATA:  I have reviewed the labs as listed.  CBC Latest Ref Rng & Units 04/10/2020 04/03/2020 03/27/2020  WBC 4.0 - 10.5 K/uL 10.6(H) 10.9(H) 13.7(H)  Hemoglobin 13.0 - 17.0 g/dL 14.2 14.6 15.3  Hematocrit 39.0 - 52.0 % 42.7 43.3 46.4  Platelets 150 - 400 K/uL 106(L) 106(L) 168   CMP Latest Ref Rng & Units 04/10/2020 04/03/2020 03/27/2020  Glucose 70 - 99 mg/dL 90  99 99  BUN 8 - 23 mg/dL 25(H) 31(H) 32(H)  Creatinine 0.61 - 1.24 mg/dL 1.57(H) 1.55(H) 1.84(H)  Sodium 135 - 145 mmol/L 136 135 135  Potassium 3.5 - 5.1 mmol/L 4.4 4.2 4.1  Chloride 98 - 111 mmol/L 102 100 102  CO2 22 - 32 mmol/L 24 26 24   Calcium 8.9 - 10.3 mg/dL 8.2(L) 8.0(L) 7.5(L)  Total Protein 6.5 - 8.1 g/dL 4.6(L) 4.6(L) 4.7(L)  Total Bilirubin 0.3 - 1.2 mg/dL 0.7 0.6 0.4  Alkaline Phos 38 - 126 U/L 112 116 150(H)  AST 15 - 41 U/L 13(L) 18 16  ALT 0 - 44 U/L 11 13 12    Lab Results  Component Value Date   TOTALPROTELP 4.4 (L) 04/03/2020   ALBUMINELP 2.2 (L) 04/03/2020   A1GS 0.2 04/03/2020   A2GS 1.1 (H) 04/03/2020   BETS 0.5 (L) 04/03/2020   GAMS 0.4 04/03/2020   MSPIKE 0.1 (H) 04/03/2020   SPEI Comment 04/03/2020    Lab Results  Component Value Date   KPAFRELGTCHN LIPG 03/06/2020   LAMBDASER NOT PERFORMED 03/06/2020   KAPLAMBRATIO 0.10 (L) 02/26/2020    DIAGNOSTIC IMAGING:  I have independently reviewed the scans and discussed with the patient. IR IMAGING GUIDED PORT INSERTION  Result Date: 03/25/2020 INDICATION: 29-year-old male with history of multiple myeloma requiring central venous access for chemotherapy. EXAM: IMPLANTED PORT A CATH PLACEMENT WITH ULTRASOUND AND FLUOROSCOPIC GUIDANCE COMPARISON:  None. MEDICATIONS: Ancef 2 gm IV; The  antibiotic was administered within an appropriate time interval prior to skin puncture. ANESTHESIA/SEDATION: Moderate (conscious) sedation was employed during this procedure. A total of Versed 1 mg and Fentanyl 50 mcg was administered intravenously. Moderate Sedation Time: 20 minutes. The patient's level of consciousness and vital signs were monitored continuously by radiology nursing throughout the procedure under my direct supervision. CONTRAST:  None FLUOROSCOPY TIME:  0 minutes, 6 seconds (2 mGy) COMPLICATIONS: None immediate. PROCEDURE: The procedure, risks, benefits, and alternatives were explained to the patient. Questions  regarding the procedure were encouraged and answered. The patient understands and consents to the procedure. The right neck and chest were prepped with chlorhexidine in a sterile fashion, and a sterile drape was applied covering the operative field. Maximum barrier sterile technique with sterile gowns and gloves were used for the procedure. A timeout was performed prior to the initiation of the procedure. Ultrasound was used to examine the jugular vein which was compressible and free of internal echoes. A skin marker was used to demarcate the planned venotomy and port pocket incision sites. Local anesthesia was provided to these sites and the subcutaneous tunnel track with 1% lidocaine with 1:100,000 epinephrine. A small incision was created at the jugular access site and blunt dissection was performed of the subcutaneous tissues. Under real time ultrasound guidance, the jugular vein was accessed with a 21 ga micropuncture needle and an 0.018" wire was inserted to the superior vena cava. A 5 Fr micopuncture set was then used, through which a 0.035" Rosen wire was passed under fluoroscopic guidance into the inferior vena cava. An 8 Fr dilator was then placed over the wire. A subcutaneous port pocket was then created along the upper chest wall utilizing a combination of sharp and blunt dissection. The pocket was irrigated with sterile saline, packed with gauze, and observed for hemorrhage. A single lumen "ISP" sized power injectable port was chosen for placement. The 8 Fr catheter was tunneled from the port pocket site to the venotomy incision. The port was placed in the pocket. The external catheter was trimmed to appropriate length. The dilator was exchanged for an 8 Fr peel-away sheath under fluoroscopic guidance. The catheter was then placed through the sheath and the sheath was removed. Final catheter positioning was confirmed and documented with a fluoroscopic spot radiograph. The port was accessed with a Huber  needle, aspirated, and flushed with heparinized saline. The deep dermal layer of the port pocket incision was closed with interrupted 3-0 Vicryl suture. The skin was opposed with a running subcuticular 4-0 Monocryl suture. Dermabond was then placed over the port pocket and neck incisions. The patient tolerated the procedure well without immediate post procedural complication. FINDINGS: After catheter placement, the tip lies within the cavoatrial junction. The catheter aspirates and flushes normally and is ready for immediate use. IMPRESSION: Successful placement of a power injectable Port-A-Cath via the right internal jugular vein. The catheter is ready for immediate use. 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 Joylene Grapes at [facility]-SPEP 0.2 g, immunofixation with biclonal IgG with lambda specificity. -Free kappa light chain 16.7, lambda light chains 122.7, ratio 0.14. -Kidney biopsy on 02/04/2020 with AL amyloidosis, lambda immunophenotype, Congo red stain positive. Immunofluorescence microscopy shows dense homogeneous glomerular mesangial staining with antisera specific for  IgG 1-2+, IgM 2+, C1q 2+, lambda light chains 3+. Waxy homogeneous staining noted along the blood vessels. Congo red stain demonstrates positive apocrine birefringence within the glomerular mesangial regions and along blood vessel walls. -48 pound weight loss since summer 2021 with decreased appetite. Positive for fatigue. -Reports occasional nausea, no vomiting. Positive for occipital headaches. -Denies any tingling or numbness in the extremities. He has cramps in the left arm which is new. He had leg cramps for a long time. -Cardiac MRI on  02/29/2020 with EF 38%, no evidence of amyloid deposits in the myocardium. -PET scan on 02/26/2020 with scattered borderline mediastinal and hilar lymph nodes with low-level hypermetabolism most likely inflammatory or reactive. No bone abnormalities. No findings in the abdomen or pelvis. -Bone marrow biopsy on 02/26/2020 with slightly hypercellular bone marrow with 23% cells consistent with plasma cells, staining for lambda light chain. No lymphoproliferative disorder. Congo red stain was negative. -Bone marrow cytogenetics were normal. FISH panel for multiple myeloma was negative. -M spike was 0.1 g, immunofixation shows IgG lambda. LDH was elevated at 216. Beta-2 microglobulin 4.5. -Dara-CyBorD started on 03/20/2020.  2. Social/family history: -He works for [employer] and retired 5 years ago. Never smoker. -Maternal aunt had cancer in male organs. One maternal first cousin had appendiceal cancer, another maternal first cousin had cancer of the digestive system.   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 -Reviewed labs from today which showed creatinine 1.57.  Calcium 8.2.  Albumin is 2.1.  CBC shows platelet count 106 normal white count. -He will proceed with day 22 of treatment today.  Recommend increased protein intake. -He will come back next week for cycle 2-day 1.  We will plan on repeating myeloma labs at that time. -We will also check 24-hour urine for total protein, UPEP and immunofixation. -I plan to see him back in 3 weeks for follow-up.  2. Nausea: -He is taking Compazine 10 mg once daily.  Nausea improved since treatment.  3. Posterior headaches: -Headaches improved since start of therapy.  4. Sleeping difficulty: -Restoril did not help.  Belsomra is too expensive. -We will start on Lunesta 1 mg at bedtime and see if it helps.   Orders placed this  encounter:  Orders Placed This Encounter  Procedures  . Immunofixation electrophoresis  . Kappa/lambda light chains  . Protein electrophoresis, serum  . 24 hr Ur UPEP/UIFE/Light Chains/TP  . CBC with Differential/Platelet  . Comprehensive metabolic panel  . Magnesium     Derek Jack, MD [place] [phone]   I, Milinda Antis, am acting as a scribe for Dr. Sanda Linger.  I, Derek Jack MD, have reviewed the above documentation for accuracy and completeness, and I agree with the above.

## 2020-04-10 NOTE — Progress Notes (Signed)
1010 Labs and portacath site reviewed with and pt seen by Dr. Ellin Saba and pt approved for Darzalex and Velcade injections and Cytoxan infusion using portacath  today per MD    Lerry Liner tolerated chemo infusion and injections well without complaints or incident. VSS upon discharge. Pt discharged self ambulatory in satisfactory condition

## 2020-04-10 NOTE — Patient Instructions (Addendum)
[facility] at [facility] Discharge Instructions  You were seen today by Dr. Ellin Saba. He went over your recent results. You received your treatment today; next week, your treatment becomes every month. Start eating protein at every meal of the day. Dr. Ellin Saba will see you back in 3 weeks for labs and follow up.   Thank you for choosing  [facility] at [facility] to provide your oncology and hematology care.  To afford each patient quality time with our provider, please arrive at least 15 minutes before your scheduled appointment time.   If you have a lab appointment with the Cancer Center please come in thru the Main Entrance and check in at the main information desk  You need to re-schedule your appointment should you arrive 10 or more minutes late.  We strive to give you quality time with our providers, and arriving late affects you and other patients whose appointments are after yours.  Also, if you no show three or more times for appointments you may be dismissed from the clinic at the providers discretion.      Again, thank you for choosing [facility].  Our hope is that these requests will decrease the amount of time that you wait before being seen by our physicians.       _____________________________________________________________  Should you have questions after your visit to [facility], please contact our office at [phone] between the hours of 8:00 a.m. and 4:30 p.m.  Voicemails left after 4:00 p.m. will not be returned until the following business day.  For prescription refill requests, have your pharmacy contact our office and allow 72 hours.    Cancer Center Support Programs:   > Cancer Support Group  2nd Tuesday of the month 1pm-2pm, Journey Room

## 2020-04-10 NOTE — Progress Notes (Signed)
Patient assessed and labs reviewed by Dr. Katragadda. Okay to proceed with treatment. Primary RN and Pharmacy aware. 

## 2020-04-10 NOTE — Patient Instructions (Signed)
[facility] Discharge Instructions for Patients Receiving Chemotherapy   Beginning January 23rd 2017 lab work for the [facility] will be done in the  Main lab at [facility] on 1st floor. If you have a lab appointment with the Cancer Center please come in thru the  Main Entrance and check in at the main information desk   Today you received the following chemotherapy agents Velcade,Darzalex and Cytoxan. Follow-up as scheduled  To help prevent nausea and vomiting after your treatment, we encourage you to take your nausea medication   If you develop nausea and vomiting, or diarrhea that is not controlled by your medication, call the clinic.  The clinic phone number is [phone]. Office hours are Monday-Friday 8:30am-5:00pm.  BELOW ARE SYMPTOMS THAT SHOULD BE REPORTED IMMEDIATELY:  *FEVER GREATER THAN 101.0 F  *CHILLS WITH OR WITHOUT FEVER  NAUSEA AND VOMITING THAT IS NOT CONTROLLED WITH YOUR NAUSEA MEDICATION  *UNUSUAL SHORTNESS OF BREATH  *UNUSUAL BRUISING OR BLEEDING  TENDERNESS IN MOUTH AND THROAT WITH OR WITHOUT PRESENCE OF ULCERS  *URINARY PROBLEMS  *BOWEL PROBLEMS  UNUSUAL RASH Items with * indicate a potential emergency and should be followed up as soon as possible. If you have an emergency after office hours please contact your primary care physician or go to the nearest emergency department.  Please call the clinic during office hours if you have any questions or concerns.   You may also contact the Patient Navigator at [phone] should you have any questions or need assistance in obtaining follow up care.      Resources For Cancer Patients and their Caregivers ? American Cancer Society: Can assist with transportation, wigs, general needs, runs Look Good Feel Better.        838-514-5778 ? Cancer Care: Provides financial assistance, online support groups, medication/co-pay assistance.  1-800-813-HOPE (401)418-6405) ? [facility] Assists [place] cancer patients and their families through emotional , educational and financial support.  904-563-3262 ? [place] DSS Where to apply for food stamps, Medicaid and utility assistance. 253-850-5781 ? [facility]: Transportation to medical appointments. 314-017-2922 ? Social Security Administration: May apply for disability if have a Stage IV cancer. 2360495761 337-469-0316 ? [employer], Disability and Transit Services: Assists with nutrition, care and transit needs. 906-032-1312

## 2020-04-11 ENCOUNTER — Inpatient Hospital Stay (HOSPITAL_COMMUNITY): Payer: Medicare Other

## 2020-04-11 ENCOUNTER — Encounter (HOSPITAL_COMMUNITY): Payer: Self-pay | Admitting: Hematology

## 2020-04-11 LAB — KAPPA/LAMBDA LIGHT CHAINS
Kappa free light chain: 10 mg/L (ref 3.3–19.4)
Kappa, lambda light chain ratio: 0.22 — ABNORMAL LOW (ref 0.26–1.65)
Lambda free light chains: 44.7 mg/L — ABNORMAL HIGH (ref 5.7–26.3)

## 2020-04-11 LAB — IGG, IGA, IGM
IgA: 38 mg/dL — ABNORMAL LOW (ref 61–437)
IgG (Immunoglobin G), Serum: 343 mg/dL — ABNORMAL LOW (ref 603–1613)
IgM (Immunoglobulin M), Srm: 93 mg/dL (ref 20–172)

## 2020-04-11 LAB — PROTEIN ELECTROPHORESIS, SERUM
A/G Ratio: 0.9 (ref 0.7–1.7)
Albumin ELP: 2 g/dL — ABNORMAL LOW (ref 2.9–4.4)
Alpha-1-Globulin: 0.2 g/dL (ref 0.0–0.4)
Alpha-2-Globulin: 1.2 g/dL — ABNORMAL HIGH (ref 0.4–1.0)
Beta Globulin: 0.5 g/dL — ABNORMAL LOW (ref 0.7–1.3)
Gamma Globulin: 0.4 g/dL (ref 0.4–1.8)
Globulin, Total: 2.3 g/dL (ref 2.2–3.9)
M-Spike, %: 0.1 g/dL — ABNORMAL HIGH
Total Protein ELP: 4.3 g/dL — ABNORMAL LOW (ref 6.0–8.5)

## 2020-04-11 NOTE — Progress Notes (Addendum)
Nutrition Assessment   Reason for Assessment: Referral from [place], Abigail Butts regarding weight loss and treatment not being given at [place]:  70 year old male with multiple myeloma.  Patient receiving daracyborD.  Past medical history of CAD, GERD, HTN, MI, PVD, stroke.  Patient seen by [facility] RD on 03/11/20. Notes reviewed.   Met with patient and wife in clinic.  Patient reports that his appetite is decreased but still trying to eat.  Breakfast maybe eggs, bacon/sausage or pastry and coffee. Lunch is apples slices with peanut butter or yogurt and granola or sandwich. Supper last night was pizza (large slice) with different meats on it.  Drinking boost high protein (240 calories and 20 g protein) 1 a day and 1 original boost shake.  Drinks water, juice, gingerale and coffee. Had issues with constipation but have resolved. Some issues with nausea and has taken medication this am.  Reports that he had hiccups starting last night during the night and this am prior to coming to appointment.  Wife reports hiccups happened last time patient had treatment (started during the night and lasted most of next day).  Has not shared this with MD.      Medications: reviewed   Labs: reviewed   Anthropometrics:   Height: 68 inches Weight: 187 lb on 1/6, stable for last few weeks UBW: 222 lb 09/18/19 BMI: 28  16% weight loss in the last 7 months   Estimated Energy Needs  Kcals: 2100-2400 Protein: 90-112 g Fluid: 2 L   NUTRITION DIAGNOSIS: Inadequate oral intake related to cancer and cancer related treatment side effects as evidenced by poor appetite and 16% weight loss in the last 7 months   INTERVENTION:  Reviewed ways to add calories and protein to diet.   Discussed strategies to help with constipation and handout provided Recipes booklet provided as well Sample of ensure complete and [employer] shakes given for patient to try (higher calorie) Message sent to MD regarding hiccups  patient was having.  Have stopped this am. Contact information provided   MONITORING, EVALUATION, GOAL: weight trends, intake   Next Visit: Feb 4 via phone  Kaye Luoma B. Zenia Resides, [place], [place] Registered Dietitian [phone] (mobile)

## 2020-04-17 ENCOUNTER — Other Ambulatory Visit: Payer: Self-pay

## 2020-04-17 ENCOUNTER — Inpatient Hospital Stay (HOSPITAL_COMMUNITY): Payer: Medicare Other

## 2020-04-17 VITALS — BP 147/89 | HR 91 | Temp 97.1°F | Resp 18

## 2020-04-17 DIAGNOSIS — E8581 Light chain (AL) amyloidosis: Secondary | ICD-10-CM

## 2020-04-17 DIAGNOSIS — C9000 Multiple myeloma not having achieved remission: Secondary | ICD-10-CM

## 2020-04-17 DIAGNOSIS — Z5111 Encounter for antineoplastic chemotherapy: Secondary | ICD-10-CM | POA: Diagnosis not present

## 2020-04-17 LAB — COMPREHENSIVE METABOLIC PANEL
ALT: 13 U/L (ref 0–44)
AST: 13 U/L — ABNORMAL LOW (ref 15–41)
Albumin: 2.2 g/dL — ABNORMAL LOW (ref 3.5–5.0)
Alkaline Phosphatase: 115 U/L (ref 38–126)
Anion gap: 6 (ref 5–15)
BUN: 25 mg/dL — ABNORMAL HIGH (ref 8–23)
CO2: 28 mmol/L (ref 22–32)
Calcium: 7.9 mg/dL — ABNORMAL LOW (ref 8.9–10.3)
Chloride: 101 mmol/L (ref 98–111)
Creatinine, Ser: 1.48 mg/dL — ABNORMAL HIGH (ref 0.61–1.24)
GFR, Estimated: 51 mL/min — ABNORMAL LOW (ref >60)
Glucose, Bld: 99 mg/dL (ref 70–99)
Potassium: 3.8 mmol/L (ref 3.5–5.1)
Sodium: 135 mmol/L (ref 135–145)
Total Bilirubin: 0.5 mg/dL (ref 0.3–1.2)
Total Protein: 4.6 g/dL — ABNORMAL LOW (ref 6.5–8.1)

## 2020-04-17 LAB — CBC WITH DIFFERENTIAL/PLATELET
Abs Immature Granulocytes: 0.08 10*3/uL — ABNORMAL HIGH (ref 0.00–0.07)
Basophils Absolute: 0 10*3/uL (ref 0.0–0.1)
Basophils Relative: 0 %
Eosinophils Absolute: 0.1 10*3/uL (ref 0.0–0.5)
Eosinophils Relative: 1 %
HCT: 43 % (ref 39.0–52.0)
Hemoglobin: 14.4 g/dL (ref 13.0–17.0)
Immature Granulocytes: 1 %
Lymphocytes Relative: 17 %
Lymphs Abs: 1.8 10*3/uL (ref 0.7–4.0)
MCH: 31.1 pg (ref 26.0–34.0)
MCHC: 33.5 g/dL (ref 30.0–36.0)
MCV: 92.9 fL (ref 80.0–100.0)
Monocytes Absolute: 0.6 10*3/uL (ref 0.1–1.0)
Monocytes Relative: 6 %
Neutro Abs: 7.9 10*3/uL — ABNORMAL HIGH (ref 1.7–7.7)
Neutrophils Relative %: 75 %
Platelets: 98 10*3/uL — ABNORMAL LOW (ref 150–400)
RBC: 4.63 MIL/uL (ref 4.22–5.81)
RDW: 18.4 % — ABNORMAL HIGH (ref 11.5–15.5)
WBC: 10.5 10*3/uL (ref 4.0–10.5)
nRBC: 0 % (ref 0.0–0.2)

## 2020-04-17 MED ORDER — ACETAMINOPHEN 325 MG PO TABS
650.0000 mg | ORAL_TABLET | Freq: Once | ORAL | Status: AC
  Administered 2020-04-17: 650 mg via ORAL
  Filled 2020-04-17: qty 2

## 2020-04-17 MED ORDER — SODIUM CHLORIDE 0.9 % IV SOLN: Freq: Once | INTRAVENOUS | Status: AC

## 2020-04-17 MED ORDER — SODIUM CHLORIDE 0.9% FLUSH: 10.0000 mL | Freq: Once | INTRAVENOUS | Status: DC

## 2020-04-17 MED ORDER — SODIUM CHLORIDE 0.9 % IV SOLN
500.0000 mg | Freq: Once | INTRAVENOUS | Status: AC
Start: 2020-04-17 — End: 2020-04-17
  Administered 2020-04-17: 500 mg via INTRAVENOUS
  Filled 2020-04-17: qty 25

## 2020-04-17 MED ORDER — BORTEZOMIB CHEMO SQ INJECTION 3.5 MG (2.5MG/ML)
1.3000 mg/m2 | Freq: Once | INTRAMUSCULAR | Status: AC
  Administered 2020-04-17: 2.75 mg via SUBCUTANEOUS
  Filled 2020-04-17: qty 1.1

## 2020-04-17 MED ORDER — DIPHENHYDRAMINE HCL 25 MG PO CAPS
50.0000 mg | ORAL_CAPSULE | Freq: Once | ORAL | Status: AC
Start: 2020-04-17 — End: 2020-04-17
  Administered 2020-04-17: 50 mg via ORAL
  Filled 2020-04-17: qty 2

## 2020-04-17 MED ORDER — SODIUM CHLORIDE 0.9 % IV SOLN
40.0000 mg | Freq: Once | INTRAVENOUS | Status: AC
  Administered 2020-04-17: 40 mg via INTRAVENOUS
  Filled 2020-04-17: qty 4

## 2020-04-17 MED ORDER — HEPARIN SOD (PORK) LOCK FLUSH 100 UNIT/ML IV SOLN
500.0000 [IU] | Freq: Once | INTRAVENOUS | Status: AC
  Administered 2020-04-17: 500 [IU] via INTRAVENOUS

## 2020-04-17 MED ORDER — PALONOSETRON HCL INJECTION 0.25 MG/5ML
0.2500 mg | Freq: Once | INTRAVENOUS | Status: AC
  Administered 2020-04-17: 0.25 mg via INTRAVENOUS
  Filled 2020-04-17: qty 5

## 2020-04-17 MED ORDER — DARATUMUMAB-HYALURONIDASE-FIHJ 1800-30000 MG-UT/15ML ~~LOC~~ SOLN
1800.0000 mg | Freq: Once | SUBCUTANEOUS | Status: AC
  Administered 2020-04-17: 1800 mg via SUBCUTANEOUS
  Filled 2020-04-17: qty 15

## 2020-04-17 MED ORDER — SODIUM CHLORIDE 0.9 % IV SOLN: Freq: Once | INTRAVENOUS | Status: DC

## 2020-04-17 NOTE — Progress Notes (Signed)
Platelets 98 today with verbal order ok to treat Dr. Delton Coombes.   Patient tolerated chemotherapy and injections with no complaints voiced.  Side effects with management reviewed with understanding verbalized.  Port site clean and dry with no bruising or swelling noted at site.  Good blood return noted before and after administration of chemotherapy.  Band aid applied.  Patient left in satisfactory condition with VSS and no s/s of distress noted.

## 2020-04-18 LAB — PROTEIN ELECTROPHORESIS, SERUM
A/G Ratio: 0.9 (ref 0.7–1.7)
Albumin ELP: 2.1 g/dL — ABNORMAL LOW (ref 2.9–4.4)
Alpha-1-Globulin: 0.2 g/dL (ref 0.0–0.4)
Alpha-2-Globulin: 1.2 g/dL — ABNORMAL HIGH (ref 0.4–1.0)
Beta Globulin: 0.5 g/dL — ABNORMAL LOW (ref 0.7–1.3)
Gamma Globulin: 0.3 g/dL — ABNORMAL LOW (ref 0.4–1.8)
Globulin, Total: 2.3 g/dL (ref 2.2–3.9)
M-Spike, %: 0.1 g/dL — ABNORMAL HIGH
Total Protein ELP: 4.4 g/dL — ABNORMAL LOW (ref 6.0–8.5)

## 2020-04-18 LAB — KAPPA/LAMBDA LIGHT CHAINS
Kappa free light chain: 10.4 mg/L (ref 3.3–19.4)
Kappa, lambda light chain ratio: 0.24 — ABNORMAL LOW (ref 0.26–1.65)
Lambda free light chains: 42.6 mg/L — ABNORMAL HIGH (ref 5.7–26.3)

## 2020-04-21 LAB — IMMUNOFIXATION ELECTROPHORESIS
IgA: 36 mg/dL — ABNORMAL LOW (ref 61–437)
IgG (Immunoglobin G), Serum: 366 mg/dL — ABNORMAL LOW (ref 603–1613)
IgM (Immunoglobulin M), Srm: 97 mg/dL (ref 20–172)
Total Protein ELP: 4.4 g/dL — ABNORMAL LOW (ref 6.0–8.5)

## 2020-04-23 LAB — UPEP/UIFE/LIGHT CHAINS/TP, 24-HR UR
% BETA, Urine: 9.1 %
ALPHA 1 URINE: 5.8 %
Albumin, U: 76.4 %
Alpha 2, Urine: 6.7 %
Free Kappa Lt Chains,Ur: 75.18 mg/L (ref 0.63–113.79)
Free Kappa/Lambda Ratio: 0.36 — ABNORMAL LOW (ref 1.03–31.76)
Free Lambda Lt Chains,Ur: 209.82 mg/L — ABNORMAL HIGH (ref 0.47–11.77)
GAMMA GLOBULIN URINE: 2.1 %
Total Protein, Urine-Ur/day: 19426 mg/(24.h) — ABNORMAL HIGH (ref 30–150)
Total Protein, Urine: 1094.4 mg/dL
Total Volume: 1775

## 2020-04-24 ENCOUNTER — Encounter (HOSPITAL_COMMUNITY): Payer: Self-pay

## 2020-04-24 ENCOUNTER — Inpatient Hospital Stay (HOSPITAL_COMMUNITY): Payer: Medicare Other

## 2020-04-24 ENCOUNTER — Other Ambulatory Visit (HOSPITAL_COMMUNITY): Payer: Self-pay | Admitting: Hematology

## 2020-04-24 ENCOUNTER — Other Ambulatory Visit: Payer: Self-pay

## 2020-04-24 VITALS — BP 150/85 | HR 88 | Temp 97.2°F | Resp 18

## 2020-04-24 DIAGNOSIS — C9000 Multiple myeloma not having achieved remission: Secondary | ICD-10-CM

## 2020-04-24 DIAGNOSIS — Z5111 Encounter for antineoplastic chemotherapy: Secondary | ICD-10-CM | POA: Diagnosis not present

## 2020-04-24 LAB — COMPREHENSIVE METABOLIC PANEL
ALT: 13 U/L (ref 0–44)
AST: 13 U/L — ABNORMAL LOW (ref 15–41)
Albumin: 2 g/dL — ABNORMAL LOW (ref 3.5–5.0)
Alkaline Phosphatase: 98 U/L (ref 38–126)
Anion gap: 8 (ref 5–15)
BUN: 21 mg/dL (ref 8–23)
CO2: 27 mmol/L (ref 22–32)
Calcium: 8 mg/dL — ABNORMAL LOW (ref 8.9–10.3)
Chloride: 103 mmol/L (ref 98–111)
Creatinine, Ser: 1.31 mg/dL — ABNORMAL HIGH (ref 0.61–1.24)
GFR, Estimated: 59 mL/min — ABNORMAL LOW (ref >60)
Glucose, Bld: 89 mg/dL (ref 70–99)
Potassium: 3.9 mmol/L (ref 3.5–5.1)
Sodium: 138 mmol/L (ref 135–145)
Total Bilirubin: 0.3 mg/dL (ref 0.3–1.2)
Total Protein: 4.4 g/dL — ABNORMAL LOW (ref 6.5–8.1)

## 2020-04-24 LAB — CBC WITH DIFFERENTIAL/PLATELET
Abs Immature Granulocytes: 0.13 10*3/uL — ABNORMAL HIGH (ref 0.00–0.07)
Basophils Absolute: 0.1 10*3/uL (ref 0.0–0.1)
Basophils Relative: 0 %
Eosinophils Absolute: 0.1 10*3/uL (ref 0.0–0.5)
Eosinophils Relative: 1 %
HCT: 40 % (ref 39.0–52.0)
Hemoglobin: 13.1 g/dL (ref 13.0–17.0)
Immature Granulocytes: 1 %
Lymphocytes Relative: 17 %
Lymphs Abs: 2.2 10*3/uL (ref 0.7–4.0)
MCH: 30.7 pg (ref 26.0–34.0)
MCHC: 32.8 g/dL (ref 30.0–36.0)
MCV: 93.7 fL (ref 80.0–100.0)
Monocytes Absolute: 0.7 10*3/uL (ref 0.1–1.0)
Monocytes Relative: 6 %
Neutro Abs: 9.4 10*3/uL — ABNORMAL HIGH (ref 1.7–7.7)
Neutrophils Relative %: 75 %
Platelets: 105 10*3/uL — ABNORMAL LOW (ref 150–400)
RBC: 4.27 MIL/uL (ref 4.22–5.81)
RDW: 19.1 % — ABNORMAL HIGH (ref 11.5–15.5)
WBC: 12.6 10*3/uL — ABNORMAL HIGH (ref 4.0–10.5)
nRBC: 0 % (ref 0.0–0.2)

## 2020-04-24 LAB — MAGNESIUM: Magnesium: 1.8 mg/dL (ref 1.7–2.4)

## 2020-04-24 MED ORDER — CHLORPROMAZINE HCL 25 MG PO TABS: 25.0000 mg | ORAL_TABLET | Freq: Four times a day (QID) | ORAL | 2 refills | Status: AC | PRN

## 2020-04-24 MED ORDER — HEPARIN SOD (PORK) LOCK FLUSH 100 UNIT/ML IV SOLN
500.0000 [IU] | Freq: Once | INTRAVENOUS | Status: AC
  Administered 2020-04-24: 500 [IU] via INTRAVENOUS

## 2020-04-24 MED ORDER — SODIUM CHLORIDE 0.9 % IV SOLN
40.0000 mg | Freq: Once | INTRAVENOUS | Status: AC
  Administered 2020-04-24: 40 mg via INTRAVENOUS
  Filled 2020-04-24: qty 4

## 2020-04-24 MED ORDER — PALONOSETRON HCL INJECTION 0.25 MG/5ML
0.2500 mg | Freq: Once | INTRAVENOUS | Status: AC
  Administered 2020-04-24: 0.25 mg via INTRAVENOUS
  Filled 2020-04-24: qty 5

## 2020-04-24 MED ORDER — SODIUM CHLORIDE 0.9 % IV SOLN
500.0000 mg | Freq: Once | INTRAVENOUS | Status: AC
  Administered 2020-04-24: 500 mg via INTRAVENOUS
  Filled 2020-04-24: qty 25

## 2020-04-24 MED ORDER — ZOLPIDEM TARTRATE 5 MG PO TABS: 5.0000 mg | ORAL_TABLET | Freq: Every evening | ORAL | 0 refills | Status: DC | PRN

## 2020-04-24 MED ORDER — SODIUM CHLORIDE 0.9 % IV SOLN: Freq: Once | INTRAVENOUS | Status: AC

## 2020-04-24 MED ORDER — BORTEZOMIB CHEMO SQ INJECTION 3.5 MG (2.5MG/ML)
1.3000 mg/m2 | Freq: Once | INTRAMUSCULAR | Status: AC
  Administered 2020-04-24: 2.75 mg via SUBCUTANEOUS
  Filled 2020-04-24: qty 1.1

## 2020-04-24 MED ORDER — SODIUM CHLORIDE 0.9% FLUSH
10.0000 mL | Freq: Once | INTRAVENOUS | Status: AC
  Administered 2020-04-24: 10 mL via INTRAVENOUS

## 2020-04-24 MED ORDER — DARATUMUMAB-HYALURONIDASE-FIHJ 1800-30000 MG-UT/15ML ~~LOC~~ SOLN
1800.0000 mg | Freq: Once | SUBCUTANEOUS | Status: AC
  Administered 2020-04-24: 1800 mg via SUBCUTANEOUS
  Filled 2020-04-24: qty 15

## 2020-04-24 MED ORDER — DIPHENHYDRAMINE HCL 25 MG PO CAPS
50.0000 mg | ORAL_CAPSULE | Freq: Once | ORAL | Status: AC
  Administered 2020-04-24: 50 mg via ORAL
  Filled 2020-04-24: qty 2

## 2020-04-24 MED ORDER — ACETAMINOPHEN 325 MG PO TABS
650.0000 mg | ORAL_TABLET | Freq: Once | ORAL | Status: AC
  Administered 2020-04-24: 650 mg via ORAL
  Filled 2020-04-24: qty 2

## 2020-04-24 NOTE — Progress Notes (Signed)
Patient tolerated chemotherapy with no complaints voiced.  Side effects with management reviewed with understanding verbalized.  Port site clean and dry with no bruising or swelling noted at site.  Good blood return noted before and after administration of chemotherapy.  Band aid applied.  Patient tolerated Velcade and Daratumumab injections with no complaints voiced.  Lab work reviewed.  See MAR for details.  Injection sites clean and dry with no bruising or swelling noted.  Band aids applied.  VSS.  Patient left in satisfactory condition with no s/s of distress noted.

## 2020-05-01 ENCOUNTER — Inpatient Hospital Stay (HOSPITAL_COMMUNITY): Payer: Medicare Other

## 2020-05-01 ENCOUNTER — Other Ambulatory Visit: Payer: Self-pay

## 2020-05-01 ENCOUNTER — Inpatient Hospital Stay (HOSPITAL_BASED_OUTPATIENT_CLINIC_OR_DEPARTMENT_OTHER): Payer: Medicare Other | Admitting: Hematology

## 2020-05-01 ENCOUNTER — Other Ambulatory Visit (HOSPITAL_COMMUNITY): Payer: Self-pay

## 2020-05-01 VITALS — BP 123/77 | HR 100 | Temp 97.2°F | Resp 18 | Wt 186.8 lb

## 2020-05-01 VITALS — BP 136/82 | HR 93 | Temp 96.9°F | Resp 18

## 2020-05-01 DIAGNOSIS — Z5111 Encounter for antineoplastic chemotherapy: Secondary | ICD-10-CM | POA: Diagnosis not present

## 2020-05-01 DIAGNOSIS — E8581 Light chain (AL) amyloidosis: Secondary | ICD-10-CM

## 2020-05-01 DIAGNOSIS — C9000 Multiple myeloma not having achieved remission: Secondary | ICD-10-CM | POA: Diagnosis not present

## 2020-05-01 LAB — COMPREHENSIVE METABOLIC PANEL
ALT: 15 U/L (ref 0–44)
AST: 15 U/L (ref 15–41)
Albumin: 2 g/dL — ABNORMAL LOW (ref 3.5–5.0)
Alkaline Phosphatase: 101 U/L (ref 38–126)
Anion gap: 6 (ref 5–15)
BUN: 23 mg/dL (ref 8–23)
CO2: 27 mmol/L (ref 22–32)
Calcium: 7.8 mg/dL — ABNORMAL LOW (ref 8.9–10.3)
Chloride: 99 mmol/L (ref 98–111)
Creatinine, Ser: 1.39 mg/dL — ABNORMAL HIGH (ref 0.61–1.24)
GFR, Estimated: 55 mL/min — ABNORMAL LOW (ref >60)
Glucose, Bld: 96 mg/dL (ref 70–99)
Potassium: 4 mmol/L (ref 3.5–5.1)
Sodium: 132 mmol/L — ABNORMAL LOW (ref 135–145)
Total Bilirubin: 0.6 mg/dL (ref 0.3–1.2)
Total Protein: 4.7 g/dL — ABNORMAL LOW (ref 6.5–8.1)

## 2020-05-01 LAB — CBC WITH DIFFERENTIAL/PLATELET
Abs Immature Granulocytes: 0.13 10*3/uL — ABNORMAL HIGH (ref 0.00–0.07)
Basophils Absolute: 0 10*3/uL (ref 0.0–0.1)
Basophils Relative: 0 %
Eosinophils Absolute: 0.1 10*3/uL (ref 0.0–0.5)
Eosinophils Relative: 1 %
HCT: 40.6 % (ref 39.0–52.0)
Hemoglobin: 13.7 g/dL (ref 13.0–17.0)
Immature Granulocytes: 1 %
Lymphocytes Relative: 19 %
Lymphs Abs: 2.3 10*3/uL (ref 0.7–4.0)
MCH: 31.4 pg (ref 26.0–34.0)
MCHC: 33.7 g/dL (ref 30.0–36.0)
MCV: 93.1 fL (ref 80.0–100.0)
Monocytes Absolute: 0.8 10*3/uL (ref 0.1–1.0)
Monocytes Relative: 7 %
Neutro Abs: 9 10*3/uL — ABNORMAL HIGH (ref 1.7–7.7)
Neutrophils Relative %: 72 %
Platelets: 109 10*3/uL — ABNORMAL LOW (ref 150–400)
RBC: 4.36 MIL/uL (ref 4.22–5.81)
RDW: 19.3 % — ABNORMAL HIGH (ref 11.5–15.5)
WBC: 12.4 10*3/uL — ABNORMAL HIGH (ref 4.0–10.5)
nRBC: 0 % (ref 0.0–0.2)

## 2020-05-01 LAB — MAGNESIUM: Magnesium: 1.7 mg/dL (ref 1.7–2.4)

## 2020-05-01 MED ORDER — BORTEZOMIB CHEMO SQ INJECTION 3.5 MG (2.5MG/ML)
1.3000 mg/m2 | Freq: Once | INTRAMUSCULAR | Status: AC
  Administered 2020-05-01: 2.75 mg via SUBCUTANEOUS
  Filled 2020-05-01: qty 1.1

## 2020-05-01 MED ORDER — ZOLPIDEM TARTRATE 10 MG PO TABS: 10.0000 mg | ORAL_TABLET | Freq: Every evening | ORAL | 0 refills | Status: DC | PRN

## 2020-05-01 MED ORDER — SODIUM CHLORIDE 0.9 % IV SOLN
40.0000 mg | Freq: Once | INTRAVENOUS | Status: AC
  Administered 2020-05-01: 40 mg via INTRAVENOUS
  Filled 2020-05-01: qty 4

## 2020-05-01 MED ORDER — DARATUMUMAB-HYALURONIDASE-FIHJ 1800-30000 MG-UT/15ML ~~LOC~~ SOLN
1800.0000 mg | Freq: Once | SUBCUTANEOUS | Status: AC
Start: 2020-05-01 — End: 2020-05-01
  Administered 2020-05-01: 1800 mg via SUBCUTANEOUS
  Filled 2020-05-01: qty 15

## 2020-05-01 MED ORDER — MEGESTROL ACETATE 400 MG/10ML PO SUSP: 400.0000 mg | Freq: Two times a day (BID) | ORAL | 2 refills | Status: AC

## 2020-05-01 MED ORDER — PALONOSETRON HCL INJECTION 0.25 MG/5ML
0.2500 mg | Freq: Once | INTRAVENOUS | Status: AC
  Administered 2020-05-01: 0.25 mg via INTRAVENOUS
  Filled 2020-05-01: qty 5

## 2020-05-01 MED ORDER — SODIUM CHLORIDE 0.9 % IV SOLN
500.0000 mg | Freq: Once | INTRAVENOUS | Status: AC
  Administered 2020-05-01: 500 mg via INTRAVENOUS
  Filled 2020-05-01: qty 25

## 2020-05-01 MED ORDER — ACETAMINOPHEN 325 MG PO TABS
650.0000 mg | ORAL_TABLET | Freq: Once | ORAL | Status: AC
  Administered 2020-05-01: 650 mg via ORAL
  Filled 2020-05-01: qty 2

## 2020-05-01 MED ORDER — DIPHENHYDRAMINE HCL 25 MG PO CAPS
50.0000 mg | ORAL_CAPSULE | Freq: Once | ORAL | Status: AC
  Administered 2020-05-01: 50 mg via ORAL
  Filled 2020-05-01: qty 2

## 2020-05-01 MED ORDER — SODIUM CHLORIDE 0.9 % IV SOLN: Freq: Once | INTRAVENOUS | Status: AC

## 2020-05-01 MED ORDER — TRAMADOL HCL 50 MG PO TABS
ORAL_TABLET | ORAL | 0 refills | Status: DC
Start: 2020-05-01 — End: 2020-05-29

## 2020-05-01 NOTE — Progress Notes (Signed)
Patient presents today for Dara and Velcade injections and Cytoxan infusion.  Vital signs and labs within parameters for treatment.  Patient complains today of weakness mostly in his legs, low energy levels and decreased appetite.  Instructed the patient to express these concerns to Dr. Delton Coombes during his office visit.  Message received from [facility], LPN/Dr. Delton Coombes patient okay for treatment.   Treatment given today per MD orders.  Tolerated infusion without adverse affects.  Vital signs stable.  No complaints at this time.  Discharge from clinic ambulatory in stable condition.  Alert and oriented X 3.  Follow up with [facility] as scheduled.

## 2020-05-01 NOTE — Patient Instructions (Signed)
[facility] at [facility] Discharge Instructions  Labs drawn from portacath today   Thank you for choosing [facility] at [facility] to provide your oncology and hematology care.  To afford each patient quality time with our provider, please arrive at least 15 minutes before your scheduled appointment time.   If you have a lab appointment with the Cancer Center please come in thru the Main Entrance and check in at the main information desk.  You need to re-schedule your appointment should you arrive 10 or more minutes late.  We strive to give you quality time with our providers, and arriving late affects you and other patients whose appointments are after yours.  Also, if you no show three or more times for appointments you may be dismissed from the clinic at the providers discretion.     Again, thank you for choosing [facility].  Our hope is that these requests will decrease the amount of time that you wait before being seen by our physicians.       _____________________________________________________________  Should you have questions after your visit to [facility], please contact our office at [phone] and follow the prompts.  Our office hours are 8:00 a.m. and 4:30 p.m. Monday - Friday.  Please note that voicemails left after 4:00 p.m. may not be returned until the following business day.  We are closed weekends and major holidays.  You do have access to a nurse 24-7, just call the main number to the clinic [phone] and do not press any options, hold on the line and a nurse will answer the phone.    For prescription refill requests, have your pharmacy contact our office and allow 72 hours.    Due to Covid, you will need to wear a mask upon entering the hospital. If you do not have a mask, a mask will be given to you at the Main Entrance upon arrival. For doctor visits, patients may have 1 support person age 18  or older with them. For treatment visits, patients can not have anyone with them due to social distancing guidelines and our immunocompromised population.     

## 2020-05-01 NOTE — Progress Notes (Signed)
Patient was assessed by Dr. Katragadda and labs have been reviewed.  Patient is okay to proceed with treatment today. Primary RN and pharmacy aware.   

## 2020-05-01 NOTE — Patient Instructions (Addendum)
[place] at [facility] Discharge Instructions  You were seen today by Dr. Delton Coombes. He went over your recent results. You received your treatment today; continue getting your weekly treatment. Your Ambien will be increased to 10 mg at bedtime. You will be prescribed Megace to improve your appetite and take 2 teaspoons twice daily. Continue eating protein-dense meals daily and supplement with high-protein Boost/Ensure or protein shakes to improve your blood albumin level. Continue staying active and do exercises while sitting to strengthen your legs. Dr. Delton Coombes will see you back in 3 weeks for labs and follow up.   Thank you for choosing [place] at [facility] to provide your oncology and hematology care.  To afford each patient quality time with our provider, please arrive at least 15 minutes before your scheduled appointment time.   If you have a lab appointment with the [place] please come in thru the Main Entrance and check in at the main information desk  You need to re-schedule your appointment should you arrive 10 or more minutes late.  We strive to give you quality time with our providers, and arriving late affects you and other patients whose appointments are after yours.  Also, if you no show three or more times for appointments you may be dismissed from the clinic at the providers discretion.     Again, thank you for choosing [facility].  Our hope is that these requests will decrease the amount of time that you wait before being seen by our physicians.       _____________________________________________________________  Should you have questions after your visit to [facility], please contact our office at [phone] between the hours of 8:00 a.m. and 4:30 p.m.  Voicemails left after 4:00 p.m. will not be returned until the following business day.  For prescription refill requests, have your  pharmacy contact our office and allow 72 hours.    Cancer Center Support Programs:   > Cancer Support Group  2nd Tuesday of the month 1pm-2pm, Journey Room

## 2020-05-01 NOTE — Patient Instructions (Signed)

## 2020-05-01 NOTE — Progress Notes (Signed)
[address], [place] [address]   CLINIC:  Medical Oncology/Hematology  PCP:  Lonia Mad, MD No address on file None   REASON FOR VISIT:  Follow-up for multiple myeloma  PRIOR THERAPY: None  NGS Results: Not done  CURRENT THERAPY: DaraCyBorD & Aloxi weekly  BRIEF ONCOLOGIC HISTORY:  Oncology History  Multiple myeloma ([place])  03/06/2020 Initial Diagnosis   Multiple myeloma ([place])   03/06/2020 Cancer Staging   Staging form: Plasma Cell Myeloma and Plasma Cell Disorders, AJCC 8th Edition - Clinical stage from 03/06/2020: RISS Stage II (Beta-2-microglobulin (mg/L): 4.5, Albumin (g/dL): 2.5, ISS: Stage II, High-risk cytogenetics: Absent, LDH: Elevated) - Signed by Derek Jack, MD on 03/06/2020   03/20/2020 - 03/20/2020 Chemotherapy   The patient had dexamethasone (DECADRON) tablet 40 mg, 40 mg, Oral,  Once, 0 of 4 cycles bortezomib SQ (VELCADE) chemo injection (2.47m/mL concentration) 2.75 mg, 1.3 mg/m2 = 2.75 mg, Subcutaneous,  Once, 0 of 4 cycles  for chemotherapy treatment.    03/20/2020 -  Chemotherapy    Patient is on Treatment Plan: PRIMARY AMYLOIDOSIS DARACYBORD (DARATUMUMAB SQ + CYCLOPHOSPHAMIDE PO + BORTEZOMIB SQ + DEXAMETHASONE PO/IV) Q28D X 6 CYCLES / DARATUMUMAB SQ Q28D        CANCER STAGING: Cancer Staging Multiple myeloma ([facility] Staging form: Plasma Cell Myeloma and Plasma Cell Disorders, AJCC 8th Edition - Clinical stage from 03/06/2020: RISS Stage II (Beta-2-microglobulin (mg/L): 4.5, Albumin (g/dL): 2.5, ISS: Stage II, High-risk cytogenetics: Absent, LDH: Elevated) - Signed by KDerek Jack MD on 03/06/2020   INTERVAL HISTORY:  Mr. TMahir Prabhakar a 70y.o. male, returns for routine follow-up and consideration for next cycle of chemotherapy. TAadiwas last seen on 04/03/2020.  Due for day #15 of cycle #2 of DaraCyBorD and Aloxi today.   Today he is accompanied by his wife. Overall, he tells me he has  been feeling okay. He notes that Ambien 5 mg is helping him sleep for 4 hours until he gets up to urinate, then he has issues falling asleep. He is taking Compazine as needed for the nausea, which is less frequent. He complains of having an aching feeling in his legs immediately above the knees, on the anterior, medial and lateral aspects; the pain is worse when walking and climbing stairs. He notes that the tramadol helps with the pain better than the Norco. He reports having cold sensation in his hands occasionally. His appetite is severely decreased since prior to starting chemo and staying stable; he is drinking 1 can of high protein and high calorie Boost daily but cannot tolerate 2 cans. He eats at least 2 meals per day along with snacking. He denies having any falls recently.  Overall, he feels ready for next cycle of chemo today.    REVIEW OF SYSTEMS:  Review of Systems  Constitutional: Positive for appetite change (25%) and fatigue (50%).  Musculoskeletal: Positive for arthralgias (6/10 bilat legs above knees).  Neurological: Positive for extremity weakness (fatigue in legs) and numbness (cold sensation).  All other systems reviewed and are negative.   PAST MEDICAL/SURGICAL HISTORY:  Past Medical History:  Diagnosis Date  . CAD (coronary artery disease)   . GERD (gastroesophageal reflux disease)   . Hypertension   . Myocardial infarction ([facility] 1994  . Peripheral vascular disease ([facility]   . Stroke ([facility]    Past Surgical History:  Procedure Laterality Date  . BYPASS GRAFT POPLITEAL TO POPLITEAL Right 03/23/2018   Procedure: BYPASS GRAFT RIGHT  ABOVE KNEE POPLITEAL TO BELOW KNEE POPLITEAL ARTERY  USING RIGHT GREAT SAPHENOUS VEIN;  Surgeon: Marty Heck, MD;  Location: [facility];  Service: Vascular;  Laterality: Right;  . BYPASS GRAFT POPLITEAL TO POPLITEAL Left 04/09/2019   Procedure: BYPASS  ABOVE KNEE POPLITEAL TO BELOW KNEE POPLITEAL AND LIGATION OF LEFT POPITEAL ANEURYSM;   Surgeon: Marty Heck, MD;  Location: [facility];  Service: Vascular;  Laterality: Left;  . COLONOSCOPY WITH ESOPHAGOGASTRODUODENOSCOPY (EGD)    . CORONARY ARTERY BYPASS GRAFT    . EYE SURGERY Bilateral    cataracts  . FEMORAL BYPASS Right 03/23/2018  . HERNIA REPAIR    . IR IMAGING GUIDED PORT INSERTION  03/25/2020  . MASTOIDECTOMY    . ROTATOR CUFF REPAIR Right   . TONSILLECTOMY    . VEIN HARVEST Right 03/23/2018   Procedure: VEIN HARVEST RIGHT GREAT SAPHENOUS;  Surgeon: Marty Heck, MD;  Location: [facility];  Service: Vascular;  Laterality: Right;  . VEIN HARVEST Left 04/09/2019   Procedure: Harvest Greater Saphenous Vein and  Revise Elisa Lateral Saphenous Vein ;  Surgeon: Marty Heck, MD;  Location: [facility];  Service: Vascular;  Laterality: Left;    SOCIAL HISTORY:  Social History   Socioeconomic History  . Marital status: Married    Spouse name: Not on file  . Number of children: Not on file  . Years of education: Not on file  . Highest education level: Not on file  Occupational History  . Not on file  Tobacco Use  . Smoking status: Never Smoker  . Smokeless tobacco: Never Used  Vaping Use  . Vaping Use: Never used  Substance and Sexual Activity  . Alcohol use: Never  . Drug use: Never  . Sexual activity: Not on file  Other Topics Concern  . Not on file  Social History Narrative  . Not on file   Social Determinants of Health   Financial Resource Strain: Low Risk   . Difficulty of Paying Living Expenses: Not hard at all  Food Insecurity: No Food Insecurity  . Worried About Charity fundraiser in the Last Year: Never true  . Ran Out of Food in the Last Year: Never true  Transportation Needs: No Transportation Needs  . Lack of Transportation (Medical): No  . Lack of Transportation (Non-Medical): No  Physical Activity: Insufficiently Active  . Days of Exercise per Week: 2 days  . Minutes of Exercise per Session: 20 min  Stress: Stress Concern  Present  . Feeling of Stress : To some extent  Social Connections: Moderately Integrated  . Frequency of Communication with Friends and Family: More than three times a week  . Frequency of Social Gatherings with Friends and Family: Twice a week  . Attends Religious Services: More than 4 times per year  . Active Member of Clubs or Organizations: No  . Attends Archivist Meetings: Never  . Marital Status: Married  Human resources officer Violence: Not At Risk  . Fear of Current or Ex-Partner: No  . Emotionally Abused: No  . Physically Abused: No  . Sexually Abused: No    FAMILY HISTORY:  Family History  Problem Relation Age of Onset  . Heart disease Mother     CURRENT MEDICATIONS:  Current Outpatient Medications  Medication Sig Dispense Refill  . acetaminophen (TYLENOL) 500 MG tablet Take 500 mg by mouth 2 (two) times daily.     Marland Kitchen acyclovir (ZOVIRAX) 400 MG tablet Take 1 tablet (400  mg total) by mouth 2 (two) times daily. 60 tablet 5  . ALPRAZolam (XANAX) 0.25 MG tablet Take 0.25 mg by mouth in the morning, at noon, in the evening, and at bedtime.     Marland Kitchen amLODipine (NORVASC) 10 MG tablet Take 10 mg by mouth daily.    Marland Kitchen aspirin 81 MG tablet Take 81 mg by mouth daily.    Marland Kitchen atorvastatin (LIPITOR) 80 MG tablet Take 80 mg by mouth at bedtime.     . carvedilol (COREG) 25 MG tablet Take 25 mg by mouth daily.     . chlorproMAZINE (THORAZINE) 25 MG tablet Take 1 tablet (25 mg total) by mouth 4 (four) times daily as needed. For hiccups 60 tablet 2  . diazepam (VALIUM) 2 MG tablet Take 2 mg by mouth daily as needed (dizziness).    . furosemide (LASIX) 20 MG tablet Take 20 mg by mouth every Monday, Wednesday, and Friday.     Marland Kitchen HYDROcodone-acetaminophen (NORCO) 5-325 MG tablet Take 1 tablet by mouth every 8 (eight) hours as needed for moderate pain. 60 tablet 0  . icosapent Ethyl (VASCEPA) 1 g capsule Take 1 g by mouth 2 (two) times daily.    . Iron-Vitamin C (VITRON-C) 65-125 MG TABS Take 1  tablet by mouth daily.    Marland Kitchen lidocaine-prilocaine (EMLA) cream Apply 1 application topically as needed. Apply to portacath as needed 30 g 0  . lisinopril (ZESTRIL) 20 MG tablet Take 20 mg by mouth in the morning and at bedtime.     . meclizine (ANTIVERT) 25 MG tablet Take 1 tablet (25 mg total) by mouth 2 (two) times daily as needed for dizziness. 30 tablet 3  . megestrol (MEGACE) 400 MG/10ML suspension Take 10 mLs (400 mg total) by mouth 2 (two) times daily. 480 mL 2  . Multiple Minerals (CALCIUM/MAGNESIUM/ZINC PO) Take 1 tablet by mouth at bedtime.    Marland Kitchen omeprazole (PRILOSEC) 20 MG capsule Take 20 mg by mouth 2 (two) times daily.     . ondansetron (ZOFRAN) 4 MG tablet Take 4 mg by mouth every 8 (eight) hours as needed for nausea.    . potassium chloride (KLOR-CON) 10 MEQ tablet Take 10 mEq by mouth 2 (two) times daily.     . prochlorperazine (COMPAZINE) 10 MG tablet Take 1 tablet (10 mg total) by mouth every 6 (six) hours as needed for nausea or vomiting. 60 tablet 2  . sildenafil (REVATIO) 20 MG tablet Take 20 mg by mouth daily.    . vitamin B-12 (CYANOCOBALAMIN) 1000 MCG tablet Take 1,000 mcg by mouth daily.    Marland Kitchen zinc gluconate 50 MG tablet Take 50 mg by mouth daily.     . traMADol (ULTRAM) 50 MG tablet TAKE ONE TABLET BY MOUTH EVERY 8 HOURS AS NEEDED FOR SEVERE PAIN 60 tablet 0  . zolpidem (AMBIEN) 10 MG tablet Take 1 tablet (10 mg total) by mouth at bedtime as needed for sleep. 20 tablet 0   No current facility-administered medications for this visit.    ALLERGIES:  Allergies  Allergen Reactions  . Vancomycin     Other reaction(s): Red Man Syndrome Noted intraoperatively on 10/04/2018. No associated hemodynamic or respiratory changes. Please give slowly.    PHYSICAL EXAM:  Performance status (ECOG): 1 - Symptomatic but completely ambulatory  Vitals:   05/01/20 0921  BP: 123/77  Pulse: 100  Resp: 18  Temp: (!) 97.2 F (36.2 C)  SpO2: 98%   Wt Readings from Last 3 Encounters:  05/01/20 186 lb 12.8 oz (84.7 kg)  05/01/20 186 lb 12.8 oz (84.7 kg)  04/24/20 188 lb 6.4 oz (85.5 kg)   Physical Exam Vitals reviewed.  Constitutional:      Appearance: Normal appearance.  Cardiovascular:     Rate and Rhythm: Normal rate and regular rhythm.     Pulses: Normal pulses.     Heart sounds: Normal heart sounds.  Pulmonary:     Effort: Pulmonary effort is normal.     Breath sounds: Normal breath sounds.  Chest:     Comments: Port-a-Cath in R chest Musculoskeletal:     Right upper leg: Tenderness (TTP above knees) present.     Left upper leg: Tenderness (TTP above knees) present.     Right lower leg: No swelling or tenderness. Edema (2+) present.     Left lower leg: No swelling or tenderness. Edema (1+) present.  Neurological:     General: No focal deficit present.     Mental Status: He is alert and oriented to person, place, and time.  Psychiatric:        Mood and Affect: Mood normal.        Behavior: Behavior normal.     LABORATORY DATA:  I have reviewed the labs as listed.  CBC Latest Ref Rng & Units 05/01/2020 04/24/2020 04/17/2020  WBC 4.0 - 10.5 K/uL 12.4(H) 12.6(H) 10.5  Hemoglobin 13.0 - 17.0 g/dL 13.7 13.1 14.4  Hematocrit 39.0 - 52.0 % 40.6 40.0 43.0  Platelets 150 - 400 K/uL 109(L) 105(L) 98(L)   CMP Latest Ref Rng & Units 05/01/2020 04/24/2020 04/17/2020  Glucose 70 - 99 mg/dL 96 89 99  BUN 8 - 23 mg/dL 23 21 25(H)  Creatinine 0.61 - 1.24 mg/dL 1.39(H) 1.31(H) 1.48(H)  Sodium 135 - 145 mmol/L 132(L) 138 135  Potassium 3.5 - 5.1 mmol/L 4.0 3.9 3.8  Chloride 98 - 111 mmol/L 99 103 101  CO2 22 - 32 mmol/L _0 Calcium 8.9 - 10.3 mg/dL 7.8(L) 8.0(L) 7.9(L)  Total Protein 6.5 - 8.1 g/dL 4.7(L) 4.4(L) 4.6(L)  Total Bilirubin 0.3 - 1.2 mg/dL 0.6 0.3 0.5  Alkaline Phos 38 - 126 U/L 101 98 115  AST 15 - 41 U/L 15 13(L) 13(L)  ALT 0 - 44 U/L _1 DIAGNOSTIC IMAGING:  I have independently reviewed the scans and discussed with the patient. No  results found.   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 Joylene Grapes at [facility]-SPEP 0.2 g, immunofixation with biclonal IgG with lambda specificity. -Free kappa light chain 16.7, lambda light chains 122.7, ratio 0.14. -Kidney biopsy on 02/04/2020 with AL amyloidosis, lambda immunophenotype, Congo red stain positive. Immunofluorescence microscopy shows dense homogeneous glomerular mesangial staining with antisera specific for IgG 1-2+, IgM 2+, C1q 2+, lambda light chains 3+. Waxy homogeneous staining noted along the blood vessels. Congo red stain demonstrates positive apocrine birefringence within the glomerular mesangial regions and along blood vessel walls. -48 pound weight loss since summer 2021 with decreased appetite. Positive for fatigue. -Reports occasional nausea, no vomiting. Positive for occipital headaches. -Denies any tingling or numbness in the extremities. He has cramps in the left arm which is new. He had leg cramps for a long time. -Cardiac MRI on 02/29/2020 with EF 38%, no evidence of amyloid deposits in the myocardium. -PET scan on 02/26/2020 with scattered borderline mediastinal and hilar lymph nodes with low-level hypermetabolism  most likely inflammatory or reactive. No bone abnormalities. No findings in the abdomen or pelvis. -Bone marrow biopsy on 02/26/2020 with slightly hypercellular bone marrow with 23% cells consistent with plasma cells, staining for lambda light chain. No lymphoproliferative disorder. Congo red stain was negative. -Bone marrow cytogenetics were normal. FISH panel for multiple myeloma was negative. -M spike was 0.1 g, immunofixation shows IgG lambda. LDH was elevated at 216. Beta-2 microglobulin 4.5. -Dara-CyBorD started on 03/20/2020.  2. Social/family history: -He works for [employer] and retired 5  years ago. Never smoker. -Maternal aunt had cancer in male organs. One maternal first cousin had appendiceal cancer, another maternal first cousin had cancer of the digestive system.   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 -24-hour urine showed total protein of 19 g, improved from 21 g prior to start of therapy. -Urine lambda light chains also improved. -He is tolerating treatment reasonably well.  He reported pains about the knees in the muscles.  Pain is worse on walking and climbing stairs.  Could be coming from Velcade versus dexamethasone.  We will closely monitor.  If there is any worsening will consider decreasing dose of Velcade. -He will use tramadol 50 mg every 8 hours as needed.  Refill was sent. -Continue with cycle 2 day 15 of treatment today. -RTC 3 weeks for follow-up.  2. Nausea: -Nausea also resolved with therapy.  Take Compazine as needed.  3. Posterior headaches: -Headaches have completely resolved.  4. Sleeping difficulty: -Ambien 5 mg tablet helped for 1 day. -We will increase Ambien to 10 mg tablet daily.  5.  Loss of appetite: -Lost 2 pounds in the last 1 month.  He drinks Ensure 1 can/day.  Eating 3 meals per day. -We will start him on Megace 400 mg twice daily.   Orders placed this encounter:  No orders of the defined types were placed in this encounter.    Derek Jack, MD [place] [phone]   I, Milinda Antis, am acting as a scribe for Dr. Sanda Linger.  I, Derek Jack MD, have reviewed the above documentation for accuracy and completeness, and I agree with the above.

## 2020-05-09 ENCOUNTER — Inpatient Hospital Stay (HOSPITAL_COMMUNITY): Payer: Medicare Other

## 2020-05-09 ENCOUNTER — Other Ambulatory Visit: Payer: Self-pay

## 2020-05-09 ENCOUNTER — Inpatient Hospital Stay (HOSPITAL_COMMUNITY): Payer: Medicare Other | Attending: Hematology

## 2020-05-09 VITALS — BP 148/86 | HR 102 | Temp 97.1°F | Resp 18

## 2020-05-09 DIAGNOSIS — G4700 Insomnia, unspecified: Secondary | ICD-10-CM | POA: Insufficient documentation

## 2020-05-09 DIAGNOSIS — C9000 Multiple myeloma not having achieved remission: Secondary | ICD-10-CM | POA: Insufficient documentation

## 2020-05-09 DIAGNOSIS — I10 Essential (primary) hypertension: Secondary | ICD-10-CM | POA: Diagnosis not present

## 2020-05-09 DIAGNOSIS — M7989 Other specified soft tissue disorders: Secondary | ICD-10-CM | POA: Diagnosis not present

## 2020-05-09 DIAGNOSIS — Z79899 Other long term (current) drug therapy: Secondary | ICD-10-CM | POA: Diagnosis not present

## 2020-05-09 DIAGNOSIS — Z809 Family history of malignant neoplasm, unspecified: Secondary | ICD-10-CM | POA: Insufficient documentation

## 2020-05-09 DIAGNOSIS — R51 Headache with orthostatic component, not elsewhere classified: Secondary | ICD-10-CM | POA: Diagnosis not present

## 2020-05-09 DIAGNOSIS — I2510 Atherosclerotic heart disease of native coronary artery without angina pectoris: Secondary | ICD-10-CM | POA: Insufficient documentation

## 2020-05-09 DIAGNOSIS — R630 Anorexia: Secondary | ICD-10-CM | POA: Diagnosis not present

## 2020-05-09 DIAGNOSIS — Z8673 Personal history of transient ischemic attack (TIA), and cerebral infarction without residual deficits: Secondary | ICD-10-CM | POA: Diagnosis not present

## 2020-05-09 DIAGNOSIS — K219 Gastro-esophageal reflux disease without esophagitis: Secondary | ICD-10-CM | POA: Diagnosis not present

## 2020-05-09 DIAGNOSIS — Z5111 Encounter for antineoplastic chemotherapy: Secondary | ICD-10-CM | POA: Insufficient documentation

## 2020-05-09 DIAGNOSIS — I252 Old myocardial infarction: Secondary | ICD-10-CM | POA: Diagnosis not present

## 2020-05-09 DIAGNOSIS — I7389 Other specified peripheral vascular diseases: Secondary | ICD-10-CM | POA: Diagnosis not present

## 2020-05-09 DIAGNOSIS — R110 Nausea: Secondary | ICD-10-CM | POA: Insufficient documentation

## 2020-05-09 LAB — CBC WITH DIFFERENTIAL/PLATELET
Abs Immature Granulocytes: 0.16 10*3/uL — ABNORMAL HIGH (ref 0.00–0.07)
Basophils Absolute: 0.1 10*3/uL (ref 0.0–0.1)
Basophils Relative: 0 %
Eosinophils Absolute: 0.3 10*3/uL (ref 0.0–0.5)
Eosinophils Relative: 2 %
HCT: 36.9 % — ABNORMAL LOW (ref 39.0–52.0)
Hemoglobin: 12.4 g/dL — ABNORMAL LOW (ref 13.0–17.0)
Immature Granulocytes: 1 %
Lymphocytes Relative: 26 %
Lymphs Abs: 3.6 10*3/uL (ref 0.7–4.0)
MCH: 31.7 pg (ref 26.0–34.0)
MCHC: 33.6 g/dL (ref 30.0–36.0)
MCV: 94.4 fL (ref 80.0–100.0)
Monocytes Absolute: 1 10*3/uL (ref 0.1–1.0)
Monocytes Relative: 7 %
Neutro Abs: 9.1 10*3/uL — ABNORMAL HIGH (ref 1.7–7.7)
Neutrophils Relative %: 64 %
Platelets: 140 10*3/uL — ABNORMAL LOW (ref 150–400)
RBC: 3.91 MIL/uL — ABNORMAL LOW (ref 4.22–5.81)
RDW: 20.4 % — ABNORMAL HIGH (ref 11.5–15.5)
WBC: 14.2 10*3/uL — ABNORMAL HIGH (ref 4.0–10.5)
nRBC: 0 % (ref 0.0–0.2)

## 2020-05-09 LAB — MAGNESIUM: Magnesium: 1.8 mg/dL (ref 1.7–2.4)

## 2020-05-09 LAB — COMPREHENSIVE METABOLIC PANEL
ALT: 13 U/L (ref 0–44)
AST: 14 U/L — ABNORMAL LOW (ref 15–41)
Albumin: 2.1 g/dL — ABNORMAL LOW (ref 3.5–5.0)
Alkaline Phosphatase: 82 U/L (ref 38–126)
Anion gap: 6 (ref 5–15)
BUN: 24 mg/dL — ABNORMAL HIGH (ref 8–23)
CO2: 24 mmol/L (ref 22–32)
Calcium: 7.9 mg/dL — ABNORMAL LOW (ref 8.9–10.3)
Chloride: 105 mmol/L (ref 98–111)
Creatinine, Ser: 1.31 mg/dL — ABNORMAL HIGH (ref 0.61–1.24)
GFR, Estimated: 59 mL/min — ABNORMAL LOW (ref >60)
Glucose, Bld: 90 mg/dL (ref 70–99)
Potassium: 4 mmol/L (ref 3.5–5.1)
Sodium: 135 mmol/L (ref 135–145)
Total Bilirubin: 0.4 mg/dL (ref 0.3–1.2)
Total Protein: 4.7 g/dL — ABNORMAL LOW (ref 6.5–8.1)

## 2020-05-09 MED ORDER — PALONOSETRON HCL INJECTION 0.25 MG/5ML
0.2500 mg | Freq: Once | INTRAVENOUS | Status: AC
  Administered 2020-05-09: 0.25 mg via INTRAVENOUS
  Filled 2020-05-09: qty 5

## 2020-05-09 MED ORDER — BORTEZOMIB CHEMO SQ INJECTION 3.5 MG (2.5MG/ML)
1.3000 mg/m2 | Freq: Once | INTRAMUSCULAR | Status: AC
  Administered 2020-05-09: 2.75 mg via SUBCUTANEOUS
  Filled 2020-05-09: qty 1.1

## 2020-05-09 MED ORDER — DIPHENHYDRAMINE HCL 25 MG PO CAPS
25.0000 mg | ORAL_CAPSULE | Freq: Once | ORAL | Status: AC
  Administered 2020-05-09: 25 mg via ORAL
  Filled 2020-05-09: qty 1

## 2020-05-09 MED ORDER — LIDOCAINE VISCOUS HCL 2 % MT SOLN: 15.0000 mL | OROMUCOSAL | 3 refills | Status: AC | PRN

## 2020-05-09 MED ORDER — SODIUM CHLORIDE 0.9 % IV SOLN: Freq: Once | INTRAVENOUS | Status: AC

## 2020-05-09 MED ORDER — HEPARIN SOD (PORK) LOCK FLUSH 100 UNIT/ML IV SOLN
500.0000 [IU] | Freq: Once | INTRAVENOUS | Status: AC
  Administered 2020-05-09: 500 [IU] via INTRAVENOUS

## 2020-05-09 MED ORDER — SODIUM CHLORIDE 0.9 % IV SOLN
20.0000 mg | Freq: Once | INTRAVENOUS | Status: AC
  Administered 2020-05-09: 20 mg via INTRAVENOUS
  Filled 2020-05-09: qty 20

## 2020-05-09 MED ORDER — DARATUMUMAB-HYALURONIDASE-FIHJ 1800-30000 MG-UT/15ML ~~LOC~~ SOLN
1800.0000 mg | Freq: Once | SUBCUTANEOUS | Status: AC
  Administered 2020-05-09: 1800 mg via SUBCUTANEOUS
  Filled 2020-05-09: qty 15

## 2020-05-09 MED ORDER — ACETAMINOPHEN 325 MG PO TABS
650.0000 mg | ORAL_TABLET | Freq: Once | ORAL | Status: AC
  Administered 2020-05-09: 650 mg via ORAL
  Filled 2020-05-09: qty 2

## 2020-05-09 MED ORDER — SODIUM CHLORIDE 0.9 % IV SOLN
500.0000 mg | Freq: Once | INTRAVENOUS | Status: AC
  Administered 2020-05-09: 500 mg via INTRAVENOUS
  Filled 2020-05-09: qty 25

## 2020-05-09 MED ORDER — SODIUM CHLORIDE 0.9 % IV SOLN
40.0000 mg | Freq: Once | INTRAVENOUS | Status: DC
  Filled 2020-05-09: qty 4

## 2020-05-09 MED ORDER — SODIUM CHLORIDE 0.9% FLUSH
10.0000 mL | Freq: Once | INTRAVENOUS | Status: AC
  Administered 2020-05-09: 10 mL via INTRAVENOUS

## 2020-05-09 NOTE — Progress Notes (Signed)
Patients port flushed without difficulty.  Good blood return noted with no bruising or swelling noted at site.  Labs collected.  Transparent dressing applied.  Patient remains accessed for chemotherapy treatment.

## 2020-05-09 NOTE — Progress Notes (Signed)
Decrease diphenhydramine to 25 mg orally today due to fall and shuffling of feet.  T.O. Dr Alphonzo Severance Aurora Mask, PharmD

## 2020-05-09 NOTE — Progress Notes (Signed)
Patient here today for Day 22 Cycle 2 of chemotherapy.  Patient has a weight gain of 12 lbs for today's treatment.  Weight rechecked on a different scale with a 1 lb difference.  Patient stated he has a mouth sore on the inside of his bottom lip.  Using peroxide at home for relief.  Fell Sunday night and needed to call for assistance to get up from the floor.  Lower leg swelling increased since last visit from last week per wife.  Hand swelling that has improved over the week.  Earlier in the week he could not get his wedding ring on but the swelling is better today.  The patient stated his legs feel "heavy" and he is shuffling his feet and using a walker at home.  The patient also stated he could not lay flat Monday or Thursday night due to SOB.  He is taking Lasix 20 mg daily.   Dr. Delton Coombes made aware and pharmacy for today's treatment.  No s/s of distress noted.   Spoke with Dr. Delton Coombes.  Patient is to start his Lasix 40mg  for leg swelling and after improvement he can decrease to Lasix 20mg  daily, start the Megace to once a day for improved appetite, and Lidocaine 1% gel for mouth ulcer telephone order Dr. Delton Coombes.    Reviewed repeat Vital signs with verbal ok to treat Dr. Delton Coombes.   Decrease premed to Dexamethasone  20 mg IV for today's treatment telephone order Dr. Delton Coombes.   Patient tolerated chemotherapy with no complaints voiced.  Side effects with management reviewed with understanding verbalized.  Port site clean and dry with no bruising or swelling noted at site.  Good blood return noted before and after administration of chemotherapy.  Band aid applied.  Patient left in satisfactory condition with VSS and no s/s of distress noted.   Patient tolerated Velcade and Darzalex injection with no complaints voiced.  Lab work reviewed.  See MAR for details.  Injection site clean and dry with no bruising or swelling noted.  Band aid applied.  VSS.  Patient left in satisfactory condition  with no s/s of distress noted.

## 2020-05-09 NOTE — Progress Notes (Signed)
Nutrition Follow-up:  Patient with multiple myeloma and receiving darcyborD.    Met with patient and wife during infusion.  Wife reports that appetite has increased, started megace on 1/27.  Reports that typically eats a mixture of cereals each morning and drinks the whole milk.  Ate 2 hot dogs recently with all the fixins, chicken and rice, ground beef with corn and spanish rice.  Has eaten a almost a whole pumpkin pie in the last week. Reports that mouthsores are improving.  Drinking 1 boost high protein shake a day then next day switches to high calorie shake. Patient did not like Dillard [place] or ensure complete shake  Medications: reviewed  Labs: reviewed  Anthropometrics:   Weight increased 198 lb today from 186 lb on 1/27 (increased swelling).  Nursing documentation reviewed.    NUTRITION DIAGNOSIS: Inadequate oral intake improving   INTERVENTION:  Encouraged patient to continue eating high calorie, high protein Continue oral nutrition supplement shakes daily     MONITORING, EVALUATION, GOAL: weight trends, intake   NEXT VISIT: Feb 25 phone call unless in clinic  Luxe Cuadros B. Zenia Resides, [place], [place] Registered Dietitian [phone] (mobile)

## 2020-05-12 LAB — PROTEIN ELECTROPHORESIS, SERUM
A/G Ratio: 0.9 (ref 0.7–1.7)
Albumin ELP: 2 g/dL — ABNORMAL LOW (ref 2.9–4.4)
Alpha-1-Globulin: 0.2 g/dL (ref 0.0–0.4)
Alpha-2-Globulin: 1.1 g/dL — ABNORMAL HIGH (ref 0.4–1.0)
Beta Globulin: 0.5 g/dL — ABNORMAL LOW (ref 0.7–1.3)
Gamma Globulin: 0.3 g/dL — ABNORMAL LOW (ref 0.4–1.8)
Globulin, Total: 2.2 g/dL (ref 2.2–3.9)
M-Spike, %: 0.1 g/dL — ABNORMAL HIGH
Total Protein ELP: 4.2 g/dL — ABNORMAL LOW (ref 6.0–8.5)

## 2020-05-12 LAB — KAPPA/LAMBDA LIGHT CHAINS
Kappa free light chain: 11.4 mg/L (ref 3.3–19.4)
Kappa, lambda light chain ratio: 0.3 (ref 0.26–1.65)
Lambda free light chains: 37.5 mg/L — ABNORMAL HIGH (ref 5.7–26.3)

## 2020-05-13 LAB — IMMUNOFIXATION ELECTROPHORESIS
IgA: 29 mg/dL — ABNORMAL LOW (ref 61–437)
IgG (Immunoglobin G), Serum: 325 mg/dL — ABNORMAL LOW (ref 603–1613)
IgM (Immunoglobulin M), Srm: 95 mg/dL (ref 20–172)
Total Protein ELP: 4.3 g/dL — ABNORMAL LOW (ref 6.0–8.5)

## 2020-05-16 ENCOUNTER — Inpatient Hospital Stay (HOSPITAL_COMMUNITY): Payer: Medicare Other

## 2020-05-16 ENCOUNTER — Other Ambulatory Visit (HOSPITAL_COMMUNITY): Payer: Self-pay

## 2020-05-16 ENCOUNTER — Other Ambulatory Visit: Payer: Self-pay

## 2020-05-16 VITALS — BP 158/92 | HR 105 | Temp 97.0°F | Resp 18

## 2020-05-16 DIAGNOSIS — C9000 Multiple myeloma not having achieved remission: Secondary | ICD-10-CM

## 2020-05-16 DIAGNOSIS — E8581 Light chain (AL) amyloidosis: Secondary | ICD-10-CM

## 2020-05-16 DIAGNOSIS — Z5111 Encounter for antineoplastic chemotherapy: Secondary | ICD-10-CM | POA: Diagnosis not present

## 2020-05-16 LAB — CBC WITH DIFFERENTIAL/PLATELET
Abs Immature Granulocytes: 0.11 10*3/uL — ABNORMAL HIGH (ref 0.00–0.07)
Basophils Absolute: 0 10*3/uL (ref 0.0–0.1)
Basophils Relative: 0 %
Eosinophils Absolute: 0.3 10*3/uL (ref 0.0–0.5)
Eosinophils Relative: 2 %
HCT: 34.7 % — ABNORMAL LOW (ref 39.0–52.0)
Hemoglobin: 11.5 g/dL — ABNORMAL LOW (ref 13.0–17.0)
Immature Granulocytes: 1 %
Lymphocytes Relative: 23 %
Lymphs Abs: 3.1 10*3/uL (ref 0.7–4.0)
MCH: 31.5 pg (ref 26.0–34.0)
MCHC: 33.1 g/dL (ref 30.0–36.0)
MCV: 95.1 fL (ref 80.0–100.0)
Monocytes Absolute: 0.7 10*3/uL (ref 0.1–1.0)
Monocytes Relative: 6 %
Neutro Abs: 9.2 10*3/uL — ABNORMAL HIGH (ref 1.7–7.7)
Neutrophils Relative %: 68 %
Platelets: 146 10*3/uL — ABNORMAL LOW (ref 150–400)
RBC: 3.65 MIL/uL — ABNORMAL LOW (ref 4.22–5.81)
RDW: 20.5 % — ABNORMAL HIGH (ref 11.5–15.5)
WBC: 13.5 10*3/uL — ABNORMAL HIGH (ref 4.0–10.5)
nRBC: 0 % (ref 0.0–0.2)

## 2020-05-16 LAB — COMPREHENSIVE METABOLIC PANEL
ALT: 13 U/L (ref 0–44)
AST: 12 U/L — ABNORMAL LOW (ref 15–41)
Albumin: 2.1 g/dL — ABNORMAL LOW (ref 3.5–5.0)
Alkaline Phosphatase: 82 U/L (ref 38–126)
Anion gap: 4 — ABNORMAL LOW (ref 5–15)
BUN: 22 mg/dL (ref 8–23)
CO2: 25 mmol/L (ref 22–32)
Calcium: 7.9 mg/dL — ABNORMAL LOW (ref 8.9–10.3)
Chloride: 108 mmol/L (ref 98–111)
Creatinine, Ser: 1.2 mg/dL (ref 0.61–1.24)
GFR, Estimated: >60 mL/min (ref >60)
Glucose, Bld: 92 mg/dL (ref 70–99)
Potassium: 4 mmol/L (ref 3.5–5.1)
Sodium: 137 mmol/L (ref 135–145)
Total Bilirubin: 0.6 mg/dL (ref 0.3–1.2)
Total Protein: 4.7 g/dL — ABNORMAL LOW (ref 6.5–8.1)

## 2020-05-16 LAB — MAGNESIUM: Magnesium: 1.6 mg/dL — ABNORMAL LOW (ref 1.7–2.4)

## 2020-05-16 MED ORDER — DIPHENHYDRAMINE HCL 25 MG PO CAPS
25.0000 mg | ORAL_CAPSULE | Freq: Once | ORAL | Status: AC
  Administered 2020-05-16: 25 mg via ORAL
  Filled 2020-05-16: qty 1

## 2020-05-16 MED ORDER — SODIUM CHLORIDE 0.9 % IV SOLN: Freq: Once | INTRAVENOUS | Status: AC

## 2020-05-16 MED ORDER — PALONOSETRON HCL INJECTION 0.25 MG/5ML
0.2500 mg | Freq: Once | INTRAVENOUS | Status: AC
  Administered 2020-05-16: 0.25 mg via INTRAVENOUS
  Filled 2020-05-16: qty 5

## 2020-05-16 MED ORDER — SODIUM CHLORIDE 0.9 % IV SOLN
20.0000 mg | Freq: Once | INTRAVENOUS | Status: AC
  Administered 2020-05-16: 20 mg via INTRAVENOUS
  Filled 2020-05-16: qty 20

## 2020-05-16 MED ORDER — BORTEZOMIB CHEMO SQ INJECTION 3.5 MG (2.5MG/ML)
1.3000 mg/m2 | Freq: Once | INTRAMUSCULAR | Status: AC
  Administered 2020-05-16: 2.75 mg via SUBCUTANEOUS
  Filled 2020-05-16: qty 1.1

## 2020-05-16 MED ORDER — ACETAMINOPHEN 325 MG PO TABS
650.0000 mg | ORAL_TABLET | Freq: Once | ORAL | Status: AC
  Administered 2020-05-16: 650 mg via ORAL
  Filled 2020-05-16: qty 2

## 2020-05-16 MED ORDER — HEPARIN SOD (PORK) LOCK FLUSH 100 UNIT/ML IV SOLN
500.0000 [IU] | Freq: Once | INTRAVENOUS | Status: AC
  Administered 2020-05-16: 500 [IU] via INTRAVENOUS

## 2020-05-16 MED ORDER — DARATUMUMAB-HYALURONIDASE-FIHJ 1800-30000 MG-UT/15ML ~~LOC~~ SOLN
1800.0000 mg | Freq: Once | SUBCUTANEOUS | Status: AC
  Administered 2020-05-16: 1800 mg via SUBCUTANEOUS
  Filled 2020-05-16: qty 15

## 2020-05-16 MED ORDER — CYCLOPHOSPHAMIDE CHEMO INJECTION 1 GM
500.0000 mg | Freq: Once | INTRAMUSCULAR | Status: AC
  Administered 2020-05-16: 500 mg via INTRAVENOUS
  Filled 2020-05-16: qty 25

## 2020-05-16 NOTE — Progress Notes (Signed)
Lowering diphenhydramine to 25 mg po today due to age and risk of fall.  T.O. Dr Alphonzo Severance Aurora Mask, PharmD

## 2020-05-16 NOTE — Progress Notes (Signed)
To treatment room for labs and chemotherapy.  Per the wife the patients lower leg swelling has improved. No pitting edema noted with exam.  No swelling noted with hands.   He still feels like his legs are "heavy" and shuffling his feet.  Continues to have SOB with lying flat at night prn.  Continues the megace one tab daily and taking Lasix 40 mg in the mornings as directed last week by Dr. Delton Coombes.  Mouth sores improved.  No s/s of distress noted.    Patient to receive Dexamethasone 20 mg IV for premeds due to leg swelling verbal order Dr. Delton Coombes.     Dr. Delton Coombes aware of HR 100 to 102 with order ok to proceed with treatment.    Patient tolerated chemotherapy with no complaints voiced.  Side effects with management reviewed with understanding verbalized.  Port site clean and dry with no bruising or swelling noted at site.  Good blood return noted before and after administration of chemotherapy.  Band aid applied.  Patient left in satisfactory condition with VSS and no s/s of distress noted.   Patient tolerated Velcade and Daratumumab injection with no complaints voiced.  Lab work reviewed.  See MAR for details.  Injection site clean and dry with no bruising or swelling noted.  Band aid applied.  VSS.  Patient left in satisfactory condition with no s/s of distress noted.

## 2020-05-19 LAB — UPEP/UIFE/LIGHT CHAINS/TP, 24-HR UR
% BETA, Urine: 8 %
ALPHA 1 URINE: 7 %
Albumin, U: 78 %
Alpha 2, Urine: 6.1 %
Free Kappa Lt Chains,Ur: 89.77 mg/L (ref 0.63–113.79)
Free Kappa/Lambda Ratio: 0.39 — ABNORMAL LOW (ref 1.03–31.76)
Free Lambda Lt Chains,Ur: 230.39 mg/L — ABNORMAL HIGH (ref 0.47–11.77)
GAMMA GLOBULIN URINE: 0.8 %
Total Protein, Urine-Ur/day: 16221 mg/24 hr — ABNORMAL HIGH (ref 30–150)
Total Protein, Urine: 1179.7 mg/dL
Total Volume: 1375

## 2020-05-19 LAB — UPEP/TP, 24-HR URINE
Albumin, U: 76.8 %
Alpha 1, Urine: 8.1 %
Alpha 2, Urine: 6.4 %
Beta, Urine: 8.2 %
Gamma Globulin, Urine: 0.4 %
Total Protein, Urine-Ur/day: 16533 mg/24 hr — ABNORMAL HIGH (ref 30–150)
Total Protein, Urine: 1202.4 mg/dL
Total Volume: 1375

## 2020-05-22 ENCOUNTER — Inpatient Hospital Stay (HOSPITAL_BASED_OUTPATIENT_CLINIC_OR_DEPARTMENT_OTHER): Payer: Medicare Other | Admitting: Hematology

## 2020-05-22 ENCOUNTER — Inpatient Hospital Stay (HOSPITAL_COMMUNITY): Payer: Medicare Other

## 2020-05-22 ENCOUNTER — Other Ambulatory Visit: Payer: Self-pay

## 2020-05-22 VITALS — BP 131/68 | HR 92 | Temp 97.3°F | Resp 18

## 2020-05-22 VITALS — BP 140/81 | HR 94 | Temp 96.8°F | Resp 18 | Wt 187.3 lb

## 2020-05-22 DIAGNOSIS — E8581 Light chain (AL) amyloidosis: Secondary | ICD-10-CM

## 2020-05-22 DIAGNOSIS — C9000 Multiple myeloma not having achieved remission: Secondary | ICD-10-CM

## 2020-05-22 DIAGNOSIS — Z5111 Encounter for antineoplastic chemotherapy: Secondary | ICD-10-CM | POA: Diagnosis not present

## 2020-05-22 LAB — COMPREHENSIVE METABOLIC PANEL
ALT: 14 U/L (ref 0–44)
AST: 14 U/L — ABNORMAL LOW (ref 15–41)
Albumin: 2.2 g/dL — ABNORMAL LOW (ref 3.5–5.0)
Alkaline Phosphatase: 76 U/L (ref 38–126)
Anion gap: 6 (ref 5–15)
BUN: 26 mg/dL — ABNORMAL HIGH (ref 8–23)
CO2: 25 mmol/L (ref 22–32)
Calcium: 8 mg/dL — ABNORMAL LOW (ref 8.9–10.3)
Chloride: 105 mmol/L (ref 98–111)
Creatinine, Ser: 1.33 mg/dL — ABNORMAL HIGH (ref 0.61–1.24)
GFR, Estimated: 58 mL/min — ABNORMAL LOW (ref >60)
Glucose, Bld: 85 mg/dL (ref 70–99)
Potassium: 4 mmol/L (ref 3.5–5.1)
Sodium: 136 mmol/L (ref 135–145)
Total Bilirubin: 0.7 mg/dL (ref 0.3–1.2)
Total Protein: 4.6 g/dL — ABNORMAL LOW (ref 6.5–8.1)

## 2020-05-22 LAB — CBC WITH DIFFERENTIAL/PLATELET
Abs Immature Granulocytes: 0.1 10*3/uL — ABNORMAL HIGH (ref 0.00–0.07)
Basophils Absolute: 0.1 10*3/uL (ref 0.0–0.1)
Basophils Relative: 0 %
Eosinophils Absolute: 0.3 10*3/uL (ref 0.0–0.5)
Eosinophils Relative: 2 %
HCT: 34.9 % — ABNORMAL LOW (ref 39.0–52.0)
Hemoglobin: 11.7 g/dL — ABNORMAL LOW (ref 13.0–17.0)
Immature Granulocytes: 1 %
Lymphocytes Relative: 20 %
Lymphs Abs: 2.6 10*3/uL (ref 0.7–4.0)
MCH: 32.1 pg (ref 26.0–34.0)
MCHC: 33.5 g/dL (ref 30.0–36.0)
MCV: 95.6 fL (ref 80.0–100.0)
Monocytes Absolute: 0.9 10*3/uL (ref 0.1–1.0)
Monocytes Relative: 7 %
Neutro Abs: 9.1 10*3/uL — ABNORMAL HIGH (ref 1.7–7.7)
Neutrophils Relative %: 70 %
Platelets: 136 10*3/uL — ABNORMAL LOW (ref 150–400)
RBC: 3.65 MIL/uL — ABNORMAL LOW (ref 4.22–5.81)
RDW: 20.7 % — ABNORMAL HIGH (ref 11.5–15.5)
WBC: 12.9 10*3/uL — ABNORMAL HIGH (ref 4.0–10.5)
nRBC: 0 % (ref 0.0–0.2)

## 2020-05-22 LAB — MAGNESIUM: Magnesium: 1.6 mg/dL — ABNORMAL LOW (ref 1.7–2.4)

## 2020-05-22 MED ORDER — SODIUM CHLORIDE 0.9 % IV SOLN: Freq: Once | INTRAVENOUS | Status: AC

## 2020-05-22 MED ORDER — PALONOSETRON HCL INJECTION 0.25 MG/5ML
0.2500 mg | Freq: Once | INTRAVENOUS | Status: AC
  Administered 2020-05-22: 0.25 mg via INTRAVENOUS

## 2020-05-22 MED ORDER — BORTEZOMIB CHEMO SQ INJECTION 3.5 MG (2.5MG/ML)
1.3000 mg/m2 | Freq: Once | INTRAMUSCULAR | Status: AC
  Administered 2020-05-22: 2.75 mg via SUBCUTANEOUS
  Filled 2020-05-22: qty 1.1

## 2020-05-22 MED ORDER — SODIUM CHLORIDE 0.9% FLUSH
10.0000 mL | Freq: Once | INTRAVENOUS | Status: AC
  Administered 2020-05-22: 10 mL via INTRAVENOUS

## 2020-05-22 MED ORDER — PALONOSETRON HCL INJECTION 0.25 MG/5ML
INTRAVENOUS | Status: AC
  Filled 2020-05-22: qty 5

## 2020-05-22 MED ORDER — SODIUM CHLORIDE 0.9 % IV SOLN
500.0000 mg | Freq: Once | INTRAVENOUS | Status: AC
  Administered 2020-05-22: 500 mg via INTRAVENOUS
  Filled 2020-05-22: qty 25

## 2020-05-22 MED ORDER — SODIUM CHLORIDE 0.9 % IV SOLN
20.0000 mg | Freq: Once | INTRAVENOUS | Status: AC
  Administered 2020-05-22: 20 mg via INTRAVENOUS
  Filled 2020-05-22: qty 20

## 2020-05-22 MED ORDER — HEPARIN SOD (PORK) LOCK FLUSH 100 UNIT/ML IV SOLN
500.0000 [IU] | Freq: Once | INTRAVENOUS | Status: AC
  Administered 2020-05-22: 500 [IU] via INTRAVENOUS

## 2020-05-22 NOTE — Progress Notes (Signed)
Patient was assessed by Dr. Katragadda and labs have been reviewed.  Patient is okay to proceed with treatment today. Primary RN and pharmacy aware.   

## 2020-05-22 NOTE — Progress Notes (Signed)
 [facility] [address] [place], [place] [address]   CLINIC:  Medical Oncology/Hematology  PCP:  Isernia, James, MD No address on file None   REASON FOR VISIT:  Follow-up for multiple myeloma  PRIOR THERAPY: None  NGS Results: Not done  CURRENT THERAPY: DaraCyBorD & Aloxi weekly  BRIEF ONCOLOGIC HISTORY:  Oncology History  Multiple myeloma (HCC)  03/06/2020 Initial Diagnosis   Multiple myeloma (HCC)   03/06/2020 Cancer Staging   Staging form: Plasma Cell Myeloma and Plasma Cell Disorders, AJCC 8th Edition - Clinical stage from 03/06/2020: RISS Stage II (Beta-2-microglobulin (mg/L): 4.5, Albumin (g/dL): 2.5, ISS: Stage II, High-risk cytogenetics: Absent, LDH: Elevated) - Signed by , , MD on 03/06/2020   03/20/2020 - 03/20/2020 Chemotherapy   The patient had dexamethasone (DECADRON) tablet 40 mg, 40 mg, Oral,  Once, 0 of 4 cycles bortezomib SQ (VELCADE) chemo injection (2.5mg/mL concentration) 2.75 mg, 1.3 mg/m2 = 2.75 mg, Subcutaneous,  Once, 0 of 4 cycles  for chemotherapy treatment.    03/20/2020 -  Chemotherapy    Patient is on Treatment Plan: PRIMARY AMYLOIDOSIS DARACYBORD (DARATUMUMAB SQ + CYCLOPHOSPHAMIDE PO + BORTEZOMIB SQ + DEXAMETHASONE PO/IV) Q28D X 6 CYCLES / DARATUMUMAB SQ Q28D        CANCER STAGING: Cancer Staging Multiple myeloma (HCC) Staging form: Plasma Cell Myeloma and Plasma Cell Disorders, AJCC 8th Edition - Clinical stage from 03/06/2020: RISS Stage II (Beta-2-microglobulin (mg/L): 4.5, Albumin (g/dL): 2.5, ISS: Stage II, High-risk cytogenetics: Absent, LDH: Elevated) - Signed by , , MD on 03/06/2020   INTERVAL HISTORY:  [name], a 69 y.o. male, returns for routine follow-up and consideration for next cycle of chemotherapy. Julian Miranda was last seen on 05/01/2020.  Due for day #8 of cycle #3 of DaraCyBorD and Aloxi today.   Today he is accompanied by his wife. Overall, he tells me he has  been feeling okay. He reports that his leg pain has resolved after taking Lasix and he is no longer requiring tramadol, though he continues having weakness in both legs. His appetite is better since he started taking Megace BID and is eating more protein and drinking protein shakes. He denies having nausea or headaches and his oral ulcers have resolved. He has not had any more falls, though he continues having difficulty climbing up steps. He stopped taking Ambien. He denies having any F/C or numbness or tingling.  He is not as active at home as he would like; he has to ambulate with a walker even at home. He is scheduled to see Dr. Peoples later this spring.  Overall, he feels ready for next cycle of chemo today.    REVIEW OF SYSTEMS:  Review of Systems  Constitutional: Positive for appetite change (75%) and fatigue (25%). Negative for chills and fever.  HENT:   Negative for mouth sores.   Gastrointestinal: Negative for nausea.  Musculoskeletal: Positive for gait problem (d/t BLE weakness). Negative for arthralgias.  Neurological: Positive for extremity weakness (bilat legs) and gait problem (d/t BLE weakness). Negative for headaches and numbness.  Psychiatric/Behavioral: Positive for sleep disturbance (stopped Ambien).  All other systems reviewed and are negative.   PAST MEDICAL/SURGICAL HISTORY:  Past Medical History:  Diagnosis Date  . CAD (coronary artery disease)   . GERD (gastroesophageal reflux disease)   . Hypertension   . Myocardial infarction (HCC) 1994  . Peripheral vascular disease (HCC)   . Stroke (HCC)    Past Surgical History:  Procedure Laterality Date  .   BYPASS GRAFT POPLITEAL TO POPLITEAL Right 03/23/2018   Procedure: BYPASS GRAFT RIGHT ABOVE KNEE POPLITEAL TO BELOW KNEE POPLITEAL ARTERY  USING RIGHT GREAT SAPHENOUS VEIN;  Surgeon: Marty Heck, MD;  Location: [place] [facility];  Service: Vascular;  Laterality: Right;  . BYPASS GRAFT POPLITEAL TO POPLITEAL Left 04/09/2019    Procedure: BYPASS  ABOVE KNEE POPLITEAL TO BELOW KNEE POPLITEAL AND LIGATION OF LEFT POPITEAL ANEURYSM;  Surgeon: Marty Heck, MD;  Location: [place];  Service: Vascular;  Laterality: Left;  . COLONOSCOPY WITH ESOPHAGOGASTRODUODENOSCOPY (EGD)    . CORONARY ARTERY BYPASS GRAFT    . EYE SURGERY Bilateral    cataracts  . FEMORAL BYPASS Right 03/23/2018  . HERNIA REPAIR    . IR IMAGING GUIDED PORT INSERTION  03/25/2020  . MASTOIDECTOMY    . ROTATOR CUFF REPAIR Right   . TONSILLECTOMY    . VEIN HARVEST Right 03/23/2018   Procedure: VEIN HARVEST RIGHT GREAT SAPHENOUS;  Surgeon: Marty Heck, MD;  Location: [place];  Service: Vascular;  Laterality: Right;  . VEIN HARVEST Left 04/09/2019   Procedure: Harvest Greater Saphenous Vein and  Revise Elisa Lateral Saphenous Vein ;  Surgeon: Marty Heck, MD;  Location: [place];  Service: Vascular;  Laterality: Left;    SOCIAL HISTORY:  Social History   Socioeconomic History  . Marital status: Married    Spouse name: Not on file  . Number of children: Not on file  . Years of education: Not on file  . Highest education level: Not on file  Occupational History  . Not on file  Tobacco Use  . Smoking status: Never Smoker  . Smokeless tobacco: Never Used  Vaping Use  . Vaping Use: Never used  Substance and Sexual Activity  . Alcohol use: Never  . Drug use: Never  . Sexual activity: Not on file  Other Topics Concern  . Not on file  Social History Narrative  . Not on file   Social Determinants of Health   Financial Resource Strain: Low Risk   . Difficulty of Paying Living Expenses: Not hard at all  Food Insecurity: No Food Insecurity  . Worried About Charity fundraiser in the Last Year: Never true  . Ran Out of Food in the Last Year: Never true  Transportation Needs: No Transportation Needs  . Lack of Transportation (Medical): No  . Lack of Transportation (Non-Medical): No  Physical Activity: Insufficiently Active  .  Days of Exercise per Week: 2 days  . Minutes of Exercise per Session: 20 min  Stress: Stress Concern Present  . Feeling of Stress : To some extent  Social Connections: Moderately Integrated  . Frequency of Communication with Friends and Family: More than three times a week  . Frequency of Social Gatherings with Friends and Family: Twice a week  . Attends Religious Services: More than 4 times per year  . Active Member of Clubs or Organizations: No  . Attends Archivist Meetings: Never  . Marital Status: Married  Human resources officer Violence: Not At Risk  . Fear of Current or Ex-Partner: No  . Emotionally Abused: No  . Physically Abused: No  . Sexually Abused: No    FAMILY HISTORY:  Family History  Problem Relation Age of Onset  . Heart disease Mother     CURRENT MEDICATIONS:  Current Outpatient Medications  Medication Sig Dispense Refill  . acetaminophen (TYLENOL) 500 MG tablet Take 500 mg by mouth 2 (two) times daily.     Marland Kitchen  acyclovir (ZOVIRAX) 400 MG tablet Take 1 tablet (400 mg total) by mouth 2 (two) times daily. 60 tablet 5  . ALPRAZolam (XANAX) 0.25 MG tablet Take 0.25 mg by mouth in the morning, at noon, in the evening, and at bedtime.     . amLODipine (NORVASC) 10 MG tablet Take 10 mg by mouth daily.    . aspirin 81 MG tablet Take 81 mg by mouth daily.    . atorvastatin (LIPITOR) 80 MG tablet Take 80 mg by mouth at bedtime.     . carvedilol (COREG) 25 MG tablet Take 25 mg by mouth daily.     . chlorproMAZINE (THORAZINE) 25 MG tablet Take 1 tablet (25 mg total) by mouth 4 (four) times daily as needed. For hiccups 60 tablet 2  . diazepam (VALIUM) 2 MG tablet Take 2 mg by mouth daily as needed (dizziness).    . furosemide (LASIX) 20 MG tablet Take 20 mg by mouth 2 (two) times daily.    . HYDROcodone-acetaminophen (NORCO) 5-325 MG tablet Take 1 tablet by mouth every 8 (eight) hours as needed for moderate pain. 60 tablet 0  . icosapent Ethyl (VASCEPA) 1 g capsule Take 1  g by mouth 2 (two) times daily.    . Iron-Vitamin C (VITRON-C) 65-125 MG TABS Take 1 tablet by mouth daily.    . lidocaine (XYLOCAINE) 2 % solution Use as directed 15 mLs in the mouth or throat as needed for mouth pain. 15 mL 3  . lidocaine-prilocaine (EMLA) cream Apply 1 application topically as needed. Apply to portacath as needed 30 g 0  . lisinopril (ZESTRIL) 20 MG tablet Take 20 mg by mouth in the morning and at bedtime.     . meclizine (ANTIVERT) 25 MG tablet Take 1 tablet (25 mg total) by mouth 2 (two) times daily as needed for dizziness. 30 tablet 3  . megestrol (MEGACE) 400 MG/10ML suspension Take 10 mLs (400 mg total) by mouth 2 (two) times daily. 480 mL 2  . Multiple Minerals (CALCIUM/MAGNESIUM/ZINC PO) Take 1 tablet by mouth at bedtime.    . omeprazole (PRILOSEC) 20 MG capsule Take 20 mg by mouth 2 (two) times daily.     . ondansetron (ZOFRAN) 4 MG tablet Take 4 mg by mouth every 8 (eight) hours as needed for nausea.    . potassium chloride (KLOR-CON) 10 MEQ tablet Take 10 mEq by mouth 2 (two) times daily.     . prochlorperazine (COMPAZINE) 10 MG tablet Take 1 tablet (10 mg total) by mouth every 6 (six) hours as needed for nausea or vomiting. 60 tablet 2  . sildenafil (REVATIO) 20 MG tablet Take 20 mg by mouth daily.    . traMADol (ULTRAM) 50 MG tablet TAKE ONE TABLET BY MOUTH EVERY 8 HOURS AS NEEDED FOR SEVERE PAIN 60 tablet 0  . vitamin B-12 (CYANOCOBALAMIN) 1000 MCG tablet Take 1,000 mcg by mouth daily.    . zinc gluconate 50 MG tablet Take 50 mg by mouth daily.     . zolpidem (AMBIEN) 10 MG tablet Take 1 tablet (10 mg total) by mouth at bedtime as needed for sleep. 20 tablet 0   No current facility-administered medications for this visit.    ALLERGIES:  Allergies  Allergen Reactions  . Vancomycin     Other reaction(s): Red Man Syndrome Noted intraoperatively on 10/04/2018. No associated hemodynamic or respiratory changes. Please give slowly.    PHYSICAL EXAM:  Performance  status (ECOG): 1 - Symptomatic but completely ambulatory    Vitals:   05/22/20 0829  BP: 140/81  Pulse: 94  Resp: 18  Temp: (!) 96.8 F (36 C)  SpO2: 98%   Wt Readings from Last 3 Encounters:  05/22/20 187 lb 4.8 oz (85 kg)  05/09/20 198 lb 12.8 oz (90.2 kg)  05/01/20 186 lb 12.8 oz (84.7 kg)   Physical Exam Vitals reviewed.  Constitutional:      Appearance: Normal appearance.  Cardiovascular:     Rate and Rhythm: Normal rate and regular rhythm.     Pulses: Normal pulses.     Heart sounds: Normal heart sounds.  Pulmonary:     Effort: Pulmonary effort is normal.     Breath sounds: Normal breath sounds.  Chest:     Comments: Port-a-Cath in R chest Musculoskeletal:     Right lower leg: Edema (1+) present.     Left lower leg: Edema (1+) present.  Neurological:     General: No focal deficit present.     Mental Status: He is alert and oriented to person, place, and time.     Motor: Weakness (4/5 weakness in bilat hip flexion) present.  Psychiatric:        Mood and Affect: Mood normal.        Behavior: Behavior normal.     LABORATORY DATA:  I have reviewed the labs as listed.  CBC Latest Ref Rng & Units 05/22/2020 05/16/2020 05/09/2020  WBC 4.0 - 10.5 K/uL 12.9(H) 13.5(H) 14.2(H)  Hemoglobin 13.0 - 17.0 g/dL 11.7(L) 11.5(L) 12.4(L)  Hematocrit 39.0 - 52.0 % 34.9(L) 34.7(L) 36.9(L)  Platelets 150 - 400 K/uL 136(L) 146(L) 140(L)   CMP Latest Ref Rng & Units 05/22/2020 05/16/2020 05/09/2020  Glucose 70 - 99 mg/dL 85 92 90  BUN 8 - 23 mg/dL 26(H) 22 24(H)  Creatinine 0.61 - 1.24 mg/dL 1.33(H) 1.20 1.31(H)  Sodium 135 - 145 mmol/L 136 137 135  Potassium 3.5 - 5.1 mmol/L 4.0 4.0 4.0  Chloride 98 - 111 mmol/L 105 108 105  CO2 22 - 32 mmol/L _0 Calcium 8.9 - 10.3 mg/dL 8.0(L) 7.9(L) 7.9(L)  Total Protein 6.5 - 8.1 g/dL 4.6(L) 4.7(L) 4.7(L)  Total Bilirubin 0.3 - 1.2 mg/dL 0.7 0.6 0.4  Alkaline Phos 38 - 126 U/L 76 82 82  AST 15 - 41 U/L 14(L) 12(L) 14(L)  ALT 0 - 44 U/L  _1 Lab Results  Component Value Date   TOTALPROTELP 4.3 (L) 05/09/2020   TOTALPROTELP 4.2 (L) 05/09/2020   ALBUMINELP 2.0 (L) 05/09/2020   A1GS 0.2 05/09/2020   A2GS 1.1 (H) 05/09/2020   BETS 0.5 (L) 05/09/2020   GAMS 0.3 (L) 05/09/2020   MSPIKE 0.1 (H) 05/09/2020   SPEI Comment 05/09/2020    Lab Results  Component Value Date   KPAFRELGTCHN 11.4 05/09/2020   LAMBDASER 37.5 (H) 05/09/2020   KAPLAMBRATIO 0.39 (L) 05/16/2020    DIAGNOSTIC IMAGING:  I have independently reviewed the scans and discussed with the patient. No results found.   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 Joylene Grapes at [facility]-SPEP 0.2 g, immunofixation with biclonal IgG with lambda specificity. -Free kappa light chain 16.7, lambda light chains 122.7, ratio 0.14. -Kidney biopsy on 02/04/2020 with AL amyloidosis, lambda immunophenotype, Congo red stain positive. Immunofluorescence microscopy shows dense homogeneous glomerular mesangial staining with antisera specific for IgG 1-2+, IgM 2+, C1q 2+, lambda light chains 3+. Waxy homogeneous staining  noted along the blood vessels. Congo red stain demonstrates positive apocrine birefringence within the glomerular mesangial regions and along blood vessel walls. -48 pound weight loss since summer 2021 with decreased appetite. Positive for fatigue. -Reports occasional nausea, no vomiting. Positive for occipital headaches. -Denies any tingling or numbness in the extremities. He has cramps in the left arm which is new. He had leg cramps for a long time. -Cardiac MRI on 02/29/2020 with EF 38%, no evidence of amyloid deposits in the myocardium. -PET scan on 02/26/2020 with scattered borderline mediastinal and hilar lymph nodes with low-level hypermetabolism most likely inflammatory or reactive. No bone abnormalities. No findings in the  abdomen or pelvis. -Bone marrow biopsy on 02/26/2020 with slightly hypercellular bone marrow with 23% cells consistent with plasma cells, staining for lambda light chain. No lymphoproliferative disorder. Congo red stain was negative. -Bone marrow cytogenetics were normal. FISH panel for multiple myeloma was negative. -M spike was 0.1 g, immunofixation shows IgG lambda. LDH was elevated at 216. Beta-2 microglobulin 4.5. -Dara-CyBorD started on 03/20/2020.  2. Social/family history: -He works for [employer] and retired 5 years ago. Never smoker. -Maternal aunt had cancer in male organs. One maternal first cousin had appendiceal cancer, another maternal first cousin had cancer of the digestive system.   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  However he feels weak in the legs when he climbs up stairs.  Question if it is steroid induced proximal myopathy.  He does not have any proximal myopathy in the upper extremities. -Reviewed myeloma labs from 05/09/2020.  Lambda light chain continues to improve at 37.5.  SPEP is stable at 0.1 g.  Immunofixation shows IgG lambda and IgG kappa (likely from Darzalex). -24-hour urine shows proteinuria improved to 16.2 g from 19.2 g. -Overall he is responding well.  Albumin is staying at 2.2.  CKD also stable at 1.3. -Continue with the current regimen.  RTC 3 weeks for follow-up.  2. Nausea: -Continue Compazine as needed.  3. Posterior headaches: -Headaches have completely resolved.  4. Sleeping difficulty: -He had a fall when he was taking Ambien.  We have discontinued sleeping medication at this time.  5.  Loss of appetite: -Continue Megace 400 mg twice daily.  He is eating slightly better.  6.  Lower extremity swelling: -We have started him on Lasix 20 mg daily.  Leg swelling has improved.   Orders placed this encounter:  No orders  of the defined types were placed in this encounter.    Julian Jack, MD [place] [phone]   I, Milinda Antis, am acting as a scribe for Dr. Sanda Linger.  I, Julian Jack MD, have reviewed the above documentation for accuracy and completeness, and I agree with the above.

## 2020-05-22 NOTE — Patient Instructions (Signed)
[place] at [facility] Discharge Instructions  You were seen today by Dr. Delton Coombes. He went over your recent results. You received your treatment today; continue getting your treatment every week. Dr. Delton Coombes will see you back in 3 weeks for labs and follow up.   Thank you for choosing [place] at [facility] to provide your oncology and hematology care.  To afford each patient quality time with our provider, please arrive at least 15 minutes before your scheduled appointment time.   If you have a lab appointment with the [facility] please come in thru the Main Entrance and check in at the main information desk  You need to re-schedule your appointment should you arrive 10 or more minutes late.  We strive to give you quality time with our providers, and arriving late affects you and other patients whose appointments are after yours.  Also, if you no show three or more times for appointments you may be dismissed from the clinic at the providers discretion.     Again, thank you for choosing [facility].  Our hope is that these requests will decrease the amount of time that you wait before being seen by our physicians.       _____________________________________________________________  Should you have questions after your visit to [facility], please contact our office at [phone] between the hours of 8:00 a.m. and 4:30 p.m.  Voicemails left after 4:00 p.m. will not be returned until the following business day.  For prescription refill requests, have your pharmacy contact our office and allow 72 hours.    Cancer Center Support Programs:   > Cancer Support Group  2nd Tuesday of the month 1pm-2pm, Journey Room

## 2020-05-22 NOTE — Patient Instructions (Signed)
[facility] Discharge Instructions for Patients Receiving Chemotherapy  Today you received the following chemotherapy agents   To help prevent nausea and vomiting after your treatment, we encourage you to take your nausea medication   If you develop nausea and vomiting that is not controlled by your nausea medication, call the clinic.   BELOW ARE SYMPTOMS THAT SHOULD BE REPORTED IMMEDIATELY:  *FEVER GREATER THAN 100.5 F  *CHILLS WITH OR WITHOUT FEVER  NAUSEA AND VOMITING THAT IS NOT CONTROLLED WITH YOUR NAUSEA MEDICATION  *UNUSUAL SHORTNESS OF BREATH  *UNUSUAL BRUISING OR BLEEDING  TENDERNESS IN MOUTH AND THROAT WITH OR WITHOUT PRESENCE OF ULCERS  *URINARY PROBLEMS  *BOWEL PROBLEMS  UNUSUAL RASH Items with * indicate a potential emergency and should be followed up as soon as possible.  Feel free to call the clinic should you have any questions or concerns. The clinic phone number is [phone].  Please show the CHEMO ALERT CARD at check-in to the Emergency Department and triage nurse.   

## 2020-05-22 NOTE — Progress Notes (Signed)
Pt here for D8C3 of velcade and cytoxan. Okay for treatment today per Dr Raliegh Ip. Tolerated treatment well today without incidence.  Discharged ambulatory in stable condition. Vital signs stable prior to discharge.

## 2020-05-29 ENCOUNTER — Encounter (HOSPITAL_COMMUNITY): Payer: Self-pay

## 2020-05-29 ENCOUNTER — Other Ambulatory Visit: Payer: Self-pay

## 2020-05-29 ENCOUNTER — Other Ambulatory Visit (HOSPITAL_COMMUNITY): Payer: Self-pay | Admitting: Hematology

## 2020-05-29 ENCOUNTER — Inpatient Hospital Stay (HOSPITAL_COMMUNITY): Payer: Medicare Other

## 2020-05-29 VITALS — BP 142/87 | HR 94 | Temp 97.3°F | Resp 18

## 2020-05-29 DIAGNOSIS — E8581 Light chain (AL) amyloidosis: Secondary | ICD-10-CM

## 2020-05-29 DIAGNOSIS — C9000 Multiple myeloma not having achieved remission: Secondary | ICD-10-CM

## 2020-05-29 DIAGNOSIS — Z5111 Encounter for antineoplastic chemotherapy: Secondary | ICD-10-CM | POA: Diagnosis not present

## 2020-05-29 LAB — COMPREHENSIVE METABOLIC PANEL
ALT: 13 U/L (ref 0–44)
AST: 13 U/L — ABNORMAL LOW (ref 15–41)
Albumin: 2.2 g/dL — ABNORMAL LOW (ref 3.5–5.0)
Alkaline Phosphatase: 84 U/L (ref 38–126)
Anion gap: 8 (ref 5–15)
BUN: 23 mg/dL (ref 8–23)
CO2: 22 mmol/L (ref 22–32)
Calcium: 8.2 mg/dL — ABNORMAL LOW (ref 8.9–10.3)
Chloride: 107 mmol/L (ref 98–111)
Creatinine, Ser: 1.3 mg/dL — ABNORMAL HIGH (ref 0.61–1.24)
GFR, Estimated: 59 mL/min — ABNORMAL LOW (ref >60)
Glucose, Bld: 88 mg/dL (ref 70–99)
Potassium: 3.9 mmol/L (ref 3.5–5.1)
Sodium: 137 mmol/L (ref 135–145)
Total Bilirubin: 0.5 mg/dL (ref 0.3–1.2)
Total Protein: 4.7 g/dL — ABNORMAL LOW (ref 6.5–8.1)

## 2020-05-29 LAB — CBC WITH DIFFERENTIAL/PLATELET
Abs Immature Granulocytes: 0.13 10*3/uL — ABNORMAL HIGH (ref 0.00–0.07)
Basophils Absolute: 0.1 10*3/uL (ref 0.0–0.1)
Basophils Relative: 0 %
Eosinophils Absolute: 0.3 10*3/uL (ref 0.0–0.5)
Eosinophils Relative: 2 %
HCT: 34.8 % — ABNORMAL LOW (ref 39.0–52.0)
Hemoglobin: 11.5 g/dL — ABNORMAL LOW (ref 13.0–17.0)
Immature Granulocytes: 1 %
Lymphocytes Relative: 22 %
Lymphs Abs: 3 10*3/uL (ref 0.7–4.0)
MCH: 31.8 pg (ref 26.0–34.0)
MCHC: 33 g/dL (ref 30.0–36.0)
MCV: 96.1 fL (ref 80.0–100.0)
Monocytes Absolute: 0.8 10*3/uL (ref 0.1–1.0)
Monocytes Relative: 6 %
Neutro Abs: 9 10*3/uL — ABNORMAL HIGH (ref 1.7–7.7)
Neutrophils Relative %: 69 %
Platelets: 177 10*3/uL (ref 150–400)
RBC: 3.62 MIL/uL — ABNORMAL LOW (ref 4.22–5.81)
RDW: 20.4 % — ABNORMAL HIGH (ref 11.5–15.5)
WBC: 13.2 10*3/uL — ABNORMAL HIGH (ref 4.0–10.5)
nRBC: 0 % (ref 0.0–0.2)

## 2020-05-29 LAB — MAGNESIUM: Magnesium: 1.7 mg/dL (ref 1.7–2.4)

## 2020-05-29 MED ORDER — DIPHENHYDRAMINE HCL 25 MG PO CAPS: 50.0000 mg | ORAL_CAPSULE | Freq: Once | ORAL | Status: DC

## 2020-05-29 MED ORDER — ACETAMINOPHEN 325 MG PO TABS
650.0000 mg | ORAL_TABLET | Freq: Once | ORAL | Status: AC
  Administered 2020-05-29: 650 mg via ORAL
  Filled 2020-05-29: qty 2

## 2020-05-29 MED ORDER — PALONOSETRON HCL INJECTION 0.25 MG/5ML
0.2500 mg | Freq: Once | INTRAVENOUS | Status: AC
  Administered 2020-05-29: 0.25 mg via INTRAVENOUS
  Filled 2020-05-29: qty 5

## 2020-05-29 MED ORDER — HEPARIN SOD (PORK) LOCK FLUSH 100 UNIT/ML IV SOLN
500.0000 [IU] | Freq: Once | INTRAVENOUS | Status: AC
  Administered 2020-05-29: 500 [IU] via INTRAVENOUS

## 2020-05-29 MED ORDER — SODIUM CHLORIDE 0.9% FLUSH
10.0000 mL | INTRAVENOUS | Status: DC | PRN
  Administered 2020-05-29: 10 mL via INTRAVENOUS

## 2020-05-29 MED ORDER — HYDROCODONE-ACETAMINOPHEN 5-325 MG PO TABS: 1.0000 | ORAL_TABLET | Freq: Three times a day (TID) | ORAL | 0 refills | Status: DC | PRN

## 2020-05-29 MED ORDER — SODIUM CHLORIDE 0.9 % IV SOLN: Freq: Once | INTRAVENOUS | Status: AC

## 2020-05-29 MED ORDER — TRAMADOL HCL 50 MG PO TABS: ORAL_TABLET | ORAL | 0 refills | Status: DC

## 2020-05-29 MED ORDER — SODIUM CHLORIDE 0.9 % IV SOLN
500.0000 mg | Freq: Once | INTRAVENOUS | Status: AC
  Administered 2020-05-29: 500 mg via INTRAVENOUS
  Filled 2020-05-29: qty 25

## 2020-05-29 MED ORDER — DIPHENHYDRAMINE HCL 25 MG PO CAPS
25.0000 mg | ORAL_CAPSULE | Freq: Once | ORAL | Status: AC
  Administered 2020-05-29: 25 mg via ORAL
  Filled 2020-05-29: qty 1

## 2020-05-29 MED ORDER — SODIUM CHLORIDE 0.9 % IV SOLN
20.0000 mg | Freq: Once | INTRAVENOUS | Status: AC
  Administered 2020-05-29: 20 mg via INTRAVENOUS
  Filled 2020-05-29: qty 20

## 2020-05-29 MED ORDER — BORTEZOMIB CHEMO SQ INJECTION 3.5 MG (2.5MG/ML)
1.3000 mg/m2 | Freq: Once | INTRAMUSCULAR | Status: AC
  Administered 2020-05-29: 2.75 mg via SUBCUTANEOUS
  Filled 2020-05-29: qty 1.1

## 2020-05-29 MED ORDER — DARATUMUMAB-HYALURONIDASE-FIHJ 1800-30000 MG-UT/15ML ~~LOC~~ SOLN
1800.0000 mg | Freq: Once | SUBCUTANEOUS | Status: AC
  Administered 2020-05-29: 1800 mg via SUBCUTANEOUS
  Filled 2020-05-29: qty 15

## 2020-05-29 NOTE — Progress Notes (Signed)
Pt here for D15C3 of cytoxan, dara, and velcade.  Vital signs stable for treatment today.  Tolerated treatment well today without incidence.  Discharged ambulatory in stable condition.  Vital signs stable prior to discharge.

## 2020-05-29 NOTE — Patient Instructions (Signed)
[facility] Discharge Instructions for Patients Receiving Chemotherapy  Today you received the following chemotherapy agents   To help prevent nausea and vomiting after your treatment, we encourage you to take your nausea medication   If you develop nausea and vomiting that is not controlled by your nausea medication, call the clinic.   BELOW ARE SYMPTOMS THAT SHOULD BE REPORTED IMMEDIATELY:  *FEVER GREATER THAN 100.5 F  *CHILLS WITH OR WITHOUT FEVER  NAUSEA AND VOMITING THAT IS NOT CONTROLLED WITH YOUR NAUSEA MEDICATION  *UNUSUAL SHORTNESS OF BREATH  *UNUSUAL BRUISING OR BLEEDING  TENDERNESS IN MOUTH AND THROAT WITH OR WITHOUT PRESENCE OF ULCERS  *URINARY PROBLEMS  *BOWEL PROBLEMS  UNUSUAL RASH Items with * indicate a potential emergency and should be followed up as soon as possible.  Feel free to call the clinic should you have any questions or concerns. The clinic phone number is [phone].  Please show the CHEMO ALERT CARD at check-in to the Emergency Department and triage nurse.   

## 2020-05-30 ENCOUNTER — Ambulatory Visit (HOSPITAL_COMMUNITY): Payer: Medicare Other

## 2020-05-30 NOTE — Progress Notes (Signed)
Nutrition Follow-up:   Patient with multiple myeloma and receiving darcyborD.    Met with patient and wife during infusion.  Wife reports that appetite has increased and patient is eating well.  Typically has mixture of cereal for breakfast with whole milk. Lunch is chicken salad sandwich or similar.  Supper is meat and vegetables and starch.  Has been snacking on nuts, popcorn, fritos, peanut butter crackers.  Drinks 1 shake a day along with water and gingerale.      Medications: reviewed  Labs: reviewed  Anthropometrics:   Weight 187 lb stable Wife reports that fluid in lower extremities has almost gone    NUTRITION DIAGNOSIS: Inadequate oral intake improving   INTERVENTION:  Patient to continue eating high calorie, high protein foods for weight maintenance.  Continue oral nutrition supplement shakes daily    MONITORING, EVALUATION, GOAL: weight trends, intake   NEXT VISIT: March 18 phone call  Sharesa Kemp B. Zenia Resides, [address], [place] Registered Dietitian [phone] (mobile)

## 2020-06-05 ENCOUNTER — Other Ambulatory Visit: Payer: Self-pay

## 2020-06-05 ENCOUNTER — Inpatient Hospital Stay (HOSPITAL_COMMUNITY): Payer: Medicare Other | Attending: Hematology

## 2020-06-05 ENCOUNTER — Inpatient Hospital Stay (HOSPITAL_COMMUNITY): Payer: Medicare Other

## 2020-06-05 ENCOUNTER — Encounter (HOSPITAL_COMMUNITY): Payer: Self-pay

## 2020-06-05 VITALS — BP 129/72 | HR 90 | Temp 99.0°F | Resp 18

## 2020-06-05 DIAGNOSIS — I10 Essential (primary) hypertension: Secondary | ICD-10-CM | POA: Diagnosis not present

## 2020-06-05 DIAGNOSIS — Z8673 Personal history of transient ischemic attack (TIA), and cerebral infarction without residual deficits: Secondary | ICD-10-CM | POA: Diagnosis not present

## 2020-06-05 DIAGNOSIS — M7989 Other specified soft tissue disorders: Secondary | ICD-10-CM | POA: Insufficient documentation

## 2020-06-05 DIAGNOSIS — R519 Headache, unspecified: Secondary | ICD-10-CM | POA: Diagnosis not present

## 2020-06-05 DIAGNOSIS — R252 Cramp and spasm: Secondary | ICD-10-CM | POA: Insufficient documentation

## 2020-06-05 DIAGNOSIS — Z7982 Long term (current) use of aspirin: Secondary | ICD-10-CM | POA: Insufficient documentation

## 2020-06-05 DIAGNOSIS — R110 Nausea: Secondary | ICD-10-CM | POA: Diagnosis not present

## 2020-06-05 DIAGNOSIS — R630 Anorexia: Secondary | ICD-10-CM | POA: Diagnosis not present

## 2020-06-05 DIAGNOSIS — R531 Weakness: Secondary | ICD-10-CM | POA: Insufficient documentation

## 2020-06-05 DIAGNOSIS — I739 Peripheral vascular disease, unspecified: Secondary | ICD-10-CM | POA: Diagnosis not present

## 2020-06-05 DIAGNOSIS — R634 Abnormal weight loss: Secondary | ICD-10-CM | POA: Diagnosis not present

## 2020-06-05 DIAGNOSIS — I2510 Atherosclerotic heart disease of native coronary artery without angina pectoris: Secondary | ICD-10-CM | POA: Insufficient documentation

## 2020-06-05 DIAGNOSIS — C9000 Multiple myeloma not having achieved remission: Secondary | ICD-10-CM

## 2020-06-05 DIAGNOSIS — Z5111 Encounter for antineoplastic chemotherapy: Secondary | ICD-10-CM | POA: Diagnosis not present

## 2020-06-05 DIAGNOSIS — K219 Gastro-esophageal reflux disease without esophagitis: Secondary | ICD-10-CM | POA: Insufficient documentation

## 2020-06-05 DIAGNOSIS — Z808 Family history of malignant neoplasm of other organs or systems: Secondary | ICD-10-CM | POA: Diagnosis not present

## 2020-06-05 DIAGNOSIS — I252 Old myocardial infarction: Secondary | ICD-10-CM | POA: Insufficient documentation

## 2020-06-05 DIAGNOSIS — Z79899 Other long term (current) drug therapy: Secondary | ICD-10-CM | POA: Insufficient documentation

## 2020-06-05 DIAGNOSIS — E8581 Light chain (AL) amyloidosis: Secondary | ICD-10-CM

## 2020-06-05 DIAGNOSIS — R7402 Elevation of levels of lactic acid dehydrogenase (LDH): Secondary | ICD-10-CM | POA: Insufficient documentation

## 2020-06-05 LAB — CBC WITH DIFFERENTIAL/PLATELET
Abs Immature Granulocytes: 0.13 10*3/uL — ABNORMAL HIGH (ref 0.00–0.07)
Basophils Absolute: 0.1 10*3/uL (ref 0.0–0.1)
Basophils Relative: 1 %
Eosinophils Absolute: 0.2 10*3/uL (ref 0.0–0.5)
Eosinophils Relative: 2 %
HCT: 34.1 % — ABNORMAL LOW (ref 39.0–52.0)
Hemoglobin: 11.6 g/dL — ABNORMAL LOW (ref 13.0–17.0)
Immature Granulocytes: 1 %
Lymphocytes Relative: 19 %
Lymphs Abs: 2.3 10*3/uL (ref 0.7–4.0)
MCH: 33.1 pg (ref 26.0–34.0)
MCHC: 34 g/dL (ref 30.0–36.0)
MCV: 97.4 fL (ref 80.0–100.0)
Monocytes Absolute: 0.8 10*3/uL (ref 0.1–1.0)
Monocytes Relative: 7 %
Neutro Abs: 8.3 10*3/uL — ABNORMAL HIGH (ref 1.7–7.7)
Neutrophils Relative %: 70 %
Platelets: 168 10*3/uL (ref 150–400)
RBC: 3.5 MIL/uL — ABNORMAL LOW (ref 4.22–5.81)
RDW: 20.9 % — ABNORMAL HIGH (ref 11.5–15.5)
WBC: 11.7 10*3/uL — ABNORMAL HIGH (ref 4.0–10.5)
nRBC: 0 % (ref 0.0–0.2)

## 2020-06-05 LAB — COMPREHENSIVE METABOLIC PANEL
ALT: 13 U/L (ref 0–44)
AST: 13 U/L — ABNORMAL LOW (ref 15–41)
Albumin: 2 g/dL — ABNORMAL LOW (ref 3.5–5.0)
Alkaline Phosphatase: 77 U/L (ref 38–126)
Anion gap: 10 (ref 5–15)
BUN: 22 mg/dL (ref 8–23)
CO2: 22 mmol/L (ref 22–32)
Calcium: 7.7 mg/dL — ABNORMAL LOW (ref 8.9–10.3)
Chloride: 104 mmol/L (ref 98–111)
Creatinine, Ser: 1.32 mg/dL — ABNORMAL HIGH (ref 0.61–1.24)
GFR, Estimated: 58 mL/min — ABNORMAL LOW (ref >60)
Glucose, Bld: 77 mg/dL (ref 70–99)
Potassium: 3.8 mmol/L (ref 3.5–5.1)
Sodium: 136 mmol/L (ref 135–145)
Total Bilirubin: 0.8 mg/dL (ref 0.3–1.2)
Total Protein: 4.4 g/dL — ABNORMAL LOW (ref 6.5–8.1)

## 2020-06-05 LAB — VITAMIN B12: Vitamin B-12: 1070 pg/mL — ABNORMAL HIGH (ref 180–914)

## 2020-06-05 LAB — VITAMIN D 25 HYDROXY (VIT D DEFICIENCY, FRACTURES): Vit D, 25-Hydroxy: 13.37 ng/mL — ABNORMAL LOW (ref 30–100)

## 2020-06-05 MED ORDER — SODIUM CHLORIDE 0.9 % IV SOLN: Freq: Once | INTRAVENOUS | Status: DC

## 2020-06-05 MED ORDER — HEPARIN SOD (PORK) LOCK FLUSH 100 UNIT/ML IV SOLN
500.0000 [IU] | Freq: Once | INTRAVENOUS | Status: AC
  Administered 2020-06-05: 500 [IU] via INTRAVENOUS

## 2020-06-05 MED ORDER — PALONOSETRON HCL INJECTION 0.25 MG/5ML
INTRAVENOUS | Status: AC
  Filled 2020-06-05: qty 5

## 2020-06-05 MED ORDER — SODIUM CHLORIDE 0.9% FLUSH
10.0000 mL | Freq: Once | INTRAVENOUS | Status: AC
  Administered 2020-06-05: 10 mL

## 2020-06-05 MED ORDER — SODIUM CHLORIDE 0.9 % IV SOLN
20.0000 mg | Freq: Once | INTRAVENOUS | Status: AC
  Administered 2020-06-05: 20 mg via INTRAVENOUS
  Filled 2020-06-05: qty 20

## 2020-06-05 MED ORDER — BORTEZOMIB CHEMO SQ INJECTION 3.5 MG (2.5MG/ML)
1.3000 mg/m2 | Freq: Once | INTRAMUSCULAR | Status: AC
  Administered 2020-06-05: 2.75 mg via SUBCUTANEOUS
  Filled 2020-06-05: qty 1.1

## 2020-06-05 MED ORDER — SODIUM CHLORIDE 0.9 % IV SOLN
500.0000 mg | Freq: Once | INTRAVENOUS | Status: AC
  Administered 2020-06-05: 500 mg via INTRAVENOUS
  Filled 2020-06-05: qty 25

## 2020-06-05 MED ORDER — PALONOSETRON HCL INJECTION 0.25 MG/5ML
0.2500 mg | Freq: Once | INTRAVENOUS | Status: AC
  Administered 2020-06-05: 0.25 mg via INTRAVENOUS

## 2020-06-05 MED ORDER — SODIUM CHLORIDE 0.9 % IV SOLN: Freq: Once | INTRAVENOUS | Status: AC

## 2020-06-05 NOTE — Patient Instructions (Signed)
[place] Discharge Instructions for Patients Receiving Chemotherapy  Today you received the following chemotherapy agents Velcade injectionand Cytoxan.   To help prevent nausea and vomiting after your treatment, we encourage you to take your nausea medication.   If you develop nausea and vomiting that is not controlled by your nausea medication, call the clinic.   BELOW ARE SYMPTOMS THAT SHOULD BE REPORTED IMMEDIATELY:  *FEVER GREATER THAN 100.5 F  *CHILLS WITH OR WITHOUT FEVER  NAUSEA AND VOMITING THAT IS NOT CONTROLLED WITH YOUR NAUSEA MEDICATION  *UNUSUAL SHORTNESS OF BREATH  *UNUSUAL BRUISING OR BLEEDING  TENDERNESS IN MOUTH AND THROAT WITH OR WITHOUT PRESENCE OF ULCERS  *URINARY PROBLEMS  *BOWEL PROBLEMS  UNUSUAL RASH Items with * indicate a potential emergency and should be followed up as soon as possible.  Feel free to call the clinic should you have any questions or concerns. The clinic phone number is [phone].  Please show the [place] at check-in to the Emergency Department and triage nurse.

## 2020-06-05 NOTE — Progress Notes (Signed)
Patient presents today for Cytoxan and Velcade injection. MAR reviewed and updated. Patient has no complaints today. Labs are within parameters for treatment. Vital signs stable.   Treatment given today per MD orders. Tolerated infusion without adverse affects. Vital signs stable. No complaints at this time. Discharged from clinic ambulatory in stable condition. Alert and oriented x 3. F/U with [facility] as scheduled.

## 2020-06-06 LAB — KAPPA/LAMBDA LIGHT CHAINS
Kappa free light chain: 9.4 mg/L (ref 3.3–19.4)
Kappa, lambda light chain ratio: 0.27 (ref 0.26–1.65)
Lambda free light chains: 35.2 mg/L — ABNORMAL HIGH (ref 5.7–26.3)

## 2020-06-06 MED ORDER — PEGFILGRASTIM-CBQV 6 MG/0.6ML ~~LOC~~ SOSY
PREFILLED_SYRINGE | SUBCUTANEOUS | Status: AC
  Filled 2020-06-06: qty 0.6

## 2020-06-12 ENCOUNTER — Inpatient Hospital Stay (HOSPITAL_COMMUNITY): Payer: Medicare Other

## 2020-06-12 ENCOUNTER — Other Ambulatory Visit: Payer: Self-pay

## 2020-06-12 ENCOUNTER — Inpatient Hospital Stay (HOSPITAL_BASED_OUTPATIENT_CLINIC_OR_DEPARTMENT_OTHER): Payer: Medicare Other | Admitting: Hematology

## 2020-06-12 VITALS — BP 112/68 | HR 88 | Temp 97.9°F | Resp 18

## 2020-06-12 VITALS — BP 131/87 | HR 89 | Temp 96.9°F | Resp 20 | Wt 187.0 lb

## 2020-06-12 DIAGNOSIS — C9000 Multiple myeloma not having achieved remission: Secondary | ICD-10-CM

## 2020-06-12 DIAGNOSIS — E8581 Light chain (AL) amyloidosis: Secondary | ICD-10-CM

## 2020-06-12 DIAGNOSIS — Z5111 Encounter for antineoplastic chemotherapy: Secondary | ICD-10-CM | POA: Diagnosis not present

## 2020-06-12 LAB — COMPREHENSIVE METABOLIC PANEL
ALT: 13 U/L (ref 0–44)
AST: 14 U/L — ABNORMAL LOW (ref 15–41)
Albumin: 2.1 g/dL — ABNORMAL LOW (ref 3.5–5.0)
Alkaline Phosphatase: 67 U/L (ref 38–126)
Anion gap: 8 (ref 5–15)
BUN: 16 mg/dL (ref 8–23)
CO2: 24 mmol/L (ref 22–32)
Calcium: 8.2 mg/dL — ABNORMAL LOW (ref 8.9–10.3)
Chloride: 105 mmol/L (ref 98–111)
Creatinine, Ser: 1.15 mg/dL (ref 0.61–1.24)
GFR, Estimated: >60 mL/min (ref >60)
Glucose, Bld: 87 mg/dL (ref 70–99)
Potassium: 4.2 mmol/L (ref 3.5–5.1)
Sodium: 137 mmol/L (ref 135–145)
Total Bilirubin: 0.8 mg/dL (ref 0.3–1.2)
Total Protein: 4.5 g/dL — ABNORMAL LOW (ref 6.5–8.1)

## 2020-06-12 LAB — CBC WITH DIFFERENTIAL/PLATELET
Abs Immature Granulocytes: 0.23 10*3/uL — ABNORMAL HIGH (ref 0.00–0.07)
Basophils Absolute: 0.1 10*3/uL (ref 0.0–0.1)
Basophils Relative: 1 %
Eosinophils Absolute: 0.2 10*3/uL (ref 0.0–0.5)
Eosinophils Relative: 2 %
HCT: 35.3 % — ABNORMAL LOW (ref 39.0–52.0)
Hemoglobin: 12.1 g/dL — ABNORMAL LOW (ref 13.0–17.0)
Immature Granulocytes: 2 %
Lymphocytes Relative: 14 %
Lymphs Abs: 1.9 10*3/uL (ref 0.7–4.0)
MCH: 33.8 pg (ref 26.0–34.0)
MCHC: 34.3 g/dL (ref 30.0–36.0)
MCV: 98.6 fL (ref 80.0–100.0)
Monocytes Absolute: 0.9 10*3/uL (ref 0.1–1.0)
Monocytes Relative: 7 %
Neutro Abs: 10.2 10*3/uL — ABNORMAL HIGH (ref 1.7–7.7)
Neutrophils Relative %: 74 %
Platelets: 192 10*3/uL (ref 150–400)
RBC: 3.58 MIL/uL — ABNORMAL LOW (ref 4.22–5.81)
RDW: 20.7 % — ABNORMAL HIGH (ref 11.5–15.5)
WBC: 13.6 10*3/uL — ABNORMAL HIGH (ref 4.0–10.5)
nRBC: 0 % (ref 0.0–0.2)

## 2020-06-12 MED ORDER — ERGOCALCIFEROL 1.25 MG (50000 UT) PO CAPS: 50000.0000 [IU] | ORAL_CAPSULE | ORAL | 3 refills | Status: AC

## 2020-06-12 MED ORDER — SODIUM CHLORIDE 0.9 % IV SOLN
20.0000 mg | Freq: Once | INTRAVENOUS | Status: AC
  Administered 2020-06-12: 20 mg via INTRAVENOUS
  Filled 2020-06-12: qty 20

## 2020-06-12 MED ORDER — PALONOSETRON HCL INJECTION 0.25 MG/5ML
0.2500 mg | Freq: Once | INTRAVENOUS | Status: AC
  Administered 2020-06-12: 0.25 mg via INTRAVENOUS

## 2020-06-12 MED ORDER — BORTEZOMIB CHEMO SQ INJECTION 3.5 MG (2.5MG/ML)
1.3000 mg/m2 | Freq: Once | INTRAMUSCULAR | Status: AC
  Administered 2020-06-12: 2.75 mg via SUBCUTANEOUS
  Filled 2020-06-12: qty 1.1

## 2020-06-12 MED ORDER — DARATUMUMAB-HYALURONIDASE-FIHJ 1800-30000 MG-UT/15ML ~~LOC~~ SOLN
1800.0000 mg | Freq: Once | SUBCUTANEOUS | Status: AC
  Administered 2020-06-12: 1800 mg via SUBCUTANEOUS
  Filled 2020-06-12: qty 15

## 2020-06-12 MED ORDER — DIPHENHYDRAMINE HCL 25 MG PO CAPS
ORAL_CAPSULE | ORAL | Status: AC
  Filled 2020-06-12: qty 2

## 2020-06-12 MED ORDER — SODIUM CHLORIDE 0.9 % IV SOLN: Freq: Once | INTRAVENOUS | Status: AC

## 2020-06-12 MED ORDER — PALONOSETRON HCL INJECTION 0.25 MG/5ML
INTRAVENOUS | Status: AC
  Filled 2020-06-12: qty 5

## 2020-06-12 MED ORDER — DIPHENHYDRAMINE HCL 25 MG PO CAPS
50.0000 mg | ORAL_CAPSULE | Freq: Once | ORAL | Status: AC
  Administered 2020-06-12: 50 mg via ORAL

## 2020-06-12 MED ORDER — SODIUM CHLORIDE 0.9 % IV SOLN
500.0000 mg | Freq: Once | INTRAVENOUS | Status: AC
  Administered 2020-06-12: 500 mg via INTRAVENOUS
  Filled 2020-06-12: qty 25

## 2020-06-12 MED ORDER — ACETAMINOPHEN 325 MG PO TABS
650.0000 mg | ORAL_TABLET | Freq: Once | ORAL | Status: AC
  Administered 2020-06-12: 650 mg via ORAL

## 2020-06-12 MED ORDER — ACETAMINOPHEN 325 MG PO TABS
ORAL_TABLET | ORAL | Status: AC
  Filled 2020-06-12: qty 2

## 2020-06-12 MED ORDER — DEXAMETHASONE 4 MG PO TABS
ORAL_TABLET | ORAL | Status: AC
  Filled 2020-06-12: qty 5

## 2020-06-12 MED ORDER — HEPARIN SOD (PORK) LOCK FLUSH 100 UNIT/ML IV SOLN
500.0000 [IU] | Freq: Once | INTRAVENOUS | Status: AC
  Administered 2020-06-12: 500 [IU] via INTRAVENOUS

## 2020-06-12 MED ORDER — SODIUM CHLORIDE 0.9% FLUSH
10.0000 mL | Freq: Once | INTRAVENOUS | Status: AC
  Administered 2020-06-12: 10 mL

## 2020-06-12 NOTE — Progress Notes (Signed)
Julian Miranda, [place] [id number]   CLINIC:  Medical Oncology/Hematology  PCP:  Lonia Mad, MD No address on file None   REASON FOR VISIT:  Follow-up for multiple myeloma  PRIOR THERAPY: None  NGS Results: Not done  CURRENT THERAPY: DaraCyBorD & Aloxi weekly  BRIEF ONCOLOGIC HISTORY:  Oncology History  Multiple myeloma ([place])  03/06/2020 Initial Diagnosis   Multiple myeloma ([place])   03/06/2020 Cancer Staging   Staging form: Plasma Cell Myeloma and Plasma Cell Disorders, AJCC 8th Edition - Clinical stage from 03/06/2020: RISS Stage II (Beta-2-microglobulin (mg/L): 4.5, Albumin (g/dL): 2.5, ISS: Stage II, High-risk cytogenetics: Absent, LDH: Elevated) - Signed by Derek Jack, MD on 03/06/2020   03/20/2020 - 03/20/2020 Chemotherapy   The patient had dexamethasone (DECADRON) tablet 40 mg, 40 mg, Oral,  Once, 0 of 4 cycles bortezomib SQ (VELCADE) chemo injection (2.67m/mL concentration) 2.75 mg, 1.3 mg/m2 = 2.75 mg, Subcutaneous,  Once, 0 of 4 cycles  for chemotherapy treatment.    03/20/2020 -  Chemotherapy    Patient is on Treatment Plan: PRIMARY AMYLOIDOSIS DARACYBORD (DARATUMUMAB SQ + CYCLOPHOSPHAMIDE PO + BORTEZOMIB SQ + DEXAMETHASONE PO/IV) Q28D X 6 CYCLES / DARATUMUMAB SQ Q28D        CANCER STAGING: Cancer Staging Multiple myeloma ([place] Staging form: Plasma Cell Myeloma and Plasma Cell Disorders, AJCC 8th Edition - Clinical stage from 03/06/2020: RISS Stage II (Beta-2-microglobulin (mg/L): 4.5, Albumin (g/dL): 2.5, ISS: Stage II, High-risk cytogenetics: Absent, LDH: Elevated) - Signed by KDerek Jack MD on 03/06/2020   INTERVAL HISTORY:  Julian Miranda a 70y.o. male, returns for routine follow-up and consideration for next cycle of immunotherapy. THunnerwas last seen on 05/22/2020.  Due for cycle #4 of DaraCyBorD and Aloxi today.   Today he is accompanied by his wife. Overall, he tells me he has been  feeling okay. He continues taking Lasix BID and Megace 1 tsp for appetite. His nausea is well controlled with daily Compazine. He denies having any headaches or falls recently, though he notes having weakness in his legs on the day of and day after treatment. His sleep has improved without meds. He denies having numbness or tingling, though his feet feel cold especially at night, which was there prior to treatment. He is able to walk and walked from the parking lot to the cancer center, though he is held by his wife due to some leg weakness. The weakness is most pronounced when he walks on the stairs.   He will follow-up with Dr. LQuentin Oreon 03/15. He will see his cardiologist on 03/25.  Overall, he feels ready for next cycle of immunotherapy today.    REVIEW OF SYSTEMS:  Review of Systems  Constitutional: Positive for appetite change and fatigue. Negative for unexpected weight change.  Cardiovascular: Positive for leg swelling.  Gastrointestinal: Positive for nausea (on Compazine).  Musculoskeletal: Positive for gait problem (d/t weakness).  Neurological: Positive for extremity weakness (leg weakness) and gait problem (d/t weakness). Negative for headaches and numbness.  Psychiatric/Behavioral: Positive for sleep disturbance (improving).  All other systems reviewed and are negative.   PAST MEDICAL/SURGICAL HISTORY:  Past Medical History:  Diagnosis Date   CAD (coronary artery disease)    GERD (gastroesophageal reflux disease)    Hypertension    Myocardial infarction ([facility] 1994   [place]    [facility]    Past Surgical History:  Procedure Laterality Date   BYPASS GRAFT POPLITEAL TO  POPLITEAL Right 03/23/2018   Procedure: BYPASS GRAFT RIGHT ABOVE KNEE POPLITEAL TO BELOW KNEE POPLITEAL ARTERY  USING RIGHT GREAT SAPHENOUS VEIN;  Surgeon: Marty Heck, MD;  Location: [facility] OR;  Service: Vascular;  Laterality: Right;   BYPASS GRAFT POPLITEAL TO  POPLITEAL Left 04/09/2019   Procedure: BYPASS  ABOVE KNEE POPLITEAL TO BELOW KNEE POPLITEAL AND LIGATION OF LEFT POPITEAL ANEURYSM;  Surgeon: Marty Heck, MD;  Location: [facility] OR;  Service: Vascular;  Laterality: Left;   COLONOSCOPY WITH ESOPHAGOGASTRODUODENOSCOPY (EGD)     CORONARY ARTERY BYPASS GRAFT     EYE SURGERY Bilateral    cataracts   FEMORAL BYPASS Right 03/23/2018   HERNIA REPAIR     IR IMAGING GUIDED PORT INSERTION  03/25/2020   MASTOIDECTOMY     ROTATOR CUFF REPAIR Right    TONSILLECTOMY     VEIN HARVEST Right 03/23/2018   Procedure: VEIN HARVEST RIGHT GREAT SAPHENOUS;  Surgeon: Marty Heck, MD;  Location: [facility] OR;  Service: Vascular;  Laterality: Right;   VEIN HARVEST Left 04/09/2019   Procedure: Harvest Greater Saphenous Vein and  Revise Elisa Lateral Saphenous Vein ;  Surgeon: Marty Heck, MD;  Location: [facility] OR;  Service: Vascular;  Laterality: Left;    SOCIAL HISTORY:  Social History   Socioeconomic History   Marital status: Married    Spouse name: Not on file   Number of children: Not on file   Years of education: Not on file   Highest education level: Not on file  Occupational History   Not on file  Tobacco Use   Smoking status: Never Smoker   Smokeless tobacco: Never Used  Vaping Use   Vaping Use: Never used  Substance and Sexual Activity   Alcohol use: Never   Drug use: Never   Sexual activity: Not on file  Other Topics Concern   Not on file  Social History Narrative   Not on file   Social Determinants of Health   Financial Resource Strain: Low Risk    Difficulty of Paying Living Expenses: Not hard at all  Food Insecurity: No Food Insecurity   Worried About Estate manager/land agent of Food in the Last Year: Never true   Arboriculturist in the Last Year: Never true  Transportation Needs: No Transportation Needs   Lack of Transportation (Medical): No   Lack of Transportation (Non-Medical): No  Physical Activity:  Insufficiently Active   Days of Exercise per Week: 2 days   Minutes of Exercise per Session: 20 min  Stress: Stress Concern Present   Feeling of Stress : To some extent  Social Connections: Moderately Integrated   Frequency of Communication with Friends and Family: More than three times a week   Frequency of Social Gatherings with Friends and Family: Twice a week   Attends Religious Services: More than 4 times per year   Active Member of [employer] or Organizations: No   Attends Music therapist: Never   Marital Status: Married  Human resources officer Violence: Not At Risk   Fear of Current or Ex-Partner: No   Emotionally Abused: No   Physically Abused: No   Sexually Abused: No    FAMILY HISTORY:  Family History  Problem Relation Age of Onset   Heart disease Mother     CURRENT MEDICATIONS:  Current Outpatient Medications  Medication Sig Dispense Refill   ergocalciferol (VITAMIN D2) 1.25 MG (50000 UT) capsule Take 1 capsule (50,000 Units total) by mouth once a week.  8 capsule 3   acetaminophen (TYLENOL) 500 MG tablet Take 500 mg by mouth 2 (two) times daily.      acyclovir (ZOVIRAX) 400 MG tablet Take 1 tablet (400 mg total) by mouth 2 (two) times daily. 60 tablet 5   ALPRAZolam (XANAX) 0.25 MG tablet Take 0.25 mg by mouth in the morning, at noon, in the evening, and at bedtime.      amLODipine (NORVASC) 10 MG tablet Take 10 mg by mouth daily.     aspirin 81 MG tablet Take 81 mg by mouth daily.     atorvastatin (LIPITOR) 80 MG tablet Take 80 mg by mouth at bedtime.      carvedilol (COREG) 25 MG tablet Take 25 mg by mouth daily.      chlorproMAZINE (THORAZINE) 25 MG tablet Take 1 tablet (25 mg total) by mouth 4 (four) times daily as needed. For hiccups 60 tablet 2   diazepam (VALIUM) 2 MG tablet Take 2 mg by mouth daily as needed (dizziness).     furosemide (LASIX) 20 MG tablet Take 20 mg by mouth 2 (two) times daily.     HYDROcodone-acetaminophen  (NORCO) 5-325 MG tablet Take 1 tablet by mouth every 8 (eight) hours as needed for moderate pain. 60 tablet 0   icosapent Ethyl (VASCEPA) 1 g capsule Take 1 g by mouth 2 (two) times daily.     Iron-Vitamin C (VITRON-C) 65-125 MG TABS Take 1 tablet by mouth daily.     lidocaine (XYLOCAINE) 2 % solution Use as directed 15 mLs in the mouth or throat as needed for mouth pain. 15 mL 3   lidocaine-prilocaine (EMLA) cream Apply 1 application topically as needed. Apply to portacath as needed 30 g 0   lisinopril (ZESTRIL) 20 MG tablet Take 20 mg by mouth in the morning and at bedtime.      meclizine (ANTIVERT) 25 MG tablet Take 1 tablet (25 mg total) by mouth 2 (two) times daily as needed for dizziness. 30 tablet 3   megestrol (MEGACE) 400 MG/10ML suspension Take 10 mLs (400 mg total) by mouth 2 (two) times daily. 480 mL 2   Multiple Minerals (CALCIUM/MAGNESIUM/ZINC PO) Take 1 tablet by mouth at bedtime.     omeprazole (PRILOSEC) 20 MG capsule Take 20 mg by mouth 2 (two) times daily.      ondansetron (ZOFRAN) 4 MG tablet Take 4 mg by mouth every 8 (eight) hours as needed for nausea.     potassium chloride (KLOR-CON) 10 MEQ tablet Take 10 mEq by mouth 2 (two) times daily.      prochlorperazine (COMPAZINE) 10 MG tablet Take 1 tablet (10 mg total) by mouth every 6 (six) hours as needed for nausea or vomiting. 60 tablet 2   scopolamine (TRANSDERM-SCOP) 1 MG/3DAYS Transderm-Scop 1 mg over 3 days transdermal patch  Apply 1 patch by transdermal route.     sildenafil (REVATIO) 20 MG tablet Take 20 mg by mouth daily.     traMADol (ULTRAM) 50 MG tablet TAKE ONE TABLET BY MOUTH EVERY 8 HOURS AS NEEDED FOR SEVERE PAIN 60 tablet 0   vitamin B-12 (CYANOCOBALAMIN) 1000 MCG tablet Take 1,000 mcg by mouth daily.     zinc gluconate 50 MG tablet Take 50 mg by mouth daily.      zolpidem (AMBIEN) 10 MG tablet Take 1 tablet (10 mg total) by mouth at bedtime as needed for sleep. 20 tablet 0   No current  facility-administered medications for this visit.  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 No associated hemodynamic or respiratory changes. Please give slowly.    PHYSICAL EXAM:  Performance status (ECOG): 1 - Symptomatic but completely ambulatory  Vitals:   06/12/20 0930  BP: 131/87  Pulse: 89  Resp: 20  Temp: (!) 96.9 F (36.1 C)  SpO2: 97%   Wt Readings from Last 3 Encounters:  06/12/20 187 lb (84.8 kg)  06/05/20 187 lb 2.7 oz (84.9 kg)  05/29/20 187 lb (84.8 kg)   Physical Exam Vitals reviewed.  Constitutional:      Appearance: Normal appearance.  Cardiovascular:     Rate and Rhythm: Normal rate and regular rhythm.     Pulses: Normal pulses.     Heart sounds: Normal heart sounds.  Pulmonary:     Effort: Pulmonary effort is normal.     Breath sounds: Normal breath sounds.  Chest:  Breasts:     Right: No supraclavicular adenopathy.     Left: No supraclavicular adenopathy.      Comments: Port-a-Cath in R chest Abdominal:     Palpations: Abdomen is soft. There is no hepatomegaly, splenomegaly or mass.     Tenderness: There is no abdominal tenderness.     Hernia: No hernia is present.  Musculoskeletal:     Right lower leg: Edema (1+) present.     Left lower leg: Edema (1+) present.  Lymphadenopathy:     Cervical: No cervical adenopathy.     Upper Body:     Right upper body: No supraclavicular adenopathy.     Left upper body: No supraclavicular adenopathy.  Neurological:     General: No focal deficit present.     Mental Status: He is alert and oriented to person, place, and time.  Psychiatric:        Mood and Affect: Mood normal.        Behavior: Behavior normal.     LABORATORY DATA:  I have reviewed the labs as listed.  CBC Latest Ref Rng & Units 06/12/2020 06/05/2020 05/29/2020  WBC 4.0 - 10.5 K/uL 13.6(H) 11.7(H) 13.2(H)  Hemoglobin 13.0 - 17.0 g/dL 12.1(L)  11.6(L) 11.5(L)  Hematocrit 39.0 - 52.0 % 35.3(L) 34.1(L) 34.8(L)  Platelets 150 - 400 K/uL 192 168 177   CMP Latest Ref Rng & Units 06/12/2020 06/05/2020 05/29/2020  Glucose 70 - 99 mg/dL 87 77 88  BUN 8 - 23 mg/dL 16 22 23   Creatinine 0.61 - 1.24 mg/dL 1.15 1.32(H) 1.30(H)  Sodium 135 - 145 mmol/L 137 136 137  Potassium 3.5 - 5.1 mmol/L 4.2 3.8 3.9  Chloride 98 - 111 mmol/L 105 104 107  CO2 22 - 32 mmol/L 24 22 22   Calcium 8.9 - 10.3 mg/dL 8.2(L) 7.7(L) 8.2(L)  Total Protein 6.5 - 8.1 g/dL 4.5(L) 4.4(L) 4.7(L)  Total Bilirubin 0.3 - 1.2 mg/dL 0.8 0.8 0.5  Alkaline Phos 38 - 126 U/L 67 77 84  AST 15 - 41 U/L 14(L) 13(L) 13(L)  ALT 0 - 44 U/L 13 13 13    Lab Results  Component Value Date   VD25OH 13.37 (L) 06/05/2020   VD25OH 13.41 (L) 03/20/2020   Lab Results  Component Value Date   TOTALPROTELP 4.3 (L) 05/09/2020   TOTALPROTELP 4.2 (L) 05/09/2020   ALBUMINELP 2.0 (L) 05/09/2020   A1GS 0.2 05/09/2020   A2GS 1.1 (H) 05/09/2020   BETS 0.5 (L) 05/09/2020   GAMS 0.3 (L) 05/09/2020  MSPIKE 0.1 (H) 05/09/2020   SPEI Comment 05/09/2020    Lab Results  Component Value Date   KPAFRELGTCHN 9.4 06/05/2020   LAMBDASER 35.2 (H) 06/05/2020   KAPLAMBRATIO 0.27 06/05/2020    DIAGNOSTIC IMAGING:  I have independently reviewed the scans and discussed with the patient. No results found.   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 Joylene Grapes at [facility]-SPEP 0.2 g, immunofixation with biclonal IgG with lambda specificity. -Free kappa light chain 16.7, lambda light chains 122.7, ratio 0.14. -Kidney biopsy on 02/04/2020 with AL amyloidosis, lambda immunophenotype, Congo red stain positive. Immunofluorescence microscopy shows dense homogeneous glomerular mesangial staining with antisera specific for IgG 1-2+, IgM 2+, C1q 2+, lambda light chains 3+. Waxy homogeneous  staining noted along the blood vessels. Congo red stain demonstrates positive apocrine birefringence within the glomerular mesangial regions and along blood vessel walls. -48 pound weight loss since summer 2021 with decreased appetite. Positive for fatigue. -Reports occasional nausea, no vomiting. Positive for occipital headaches. -Denies any tingling or numbness in the extremities. He has cramps in the left arm which is new. He had leg cramps for a long time. -Cardiac MRI on 02/29/2020 with EF 38%, no evidence of amyloid deposits in the myocardium. -PET scan on 02/26/2020 with scattered borderline mediastinal and hilar lymph nodes with low-level hypermetabolism most likely inflammatory or reactive. No bone abnormalities. No findings in the abdomen or pelvis. -Bone marrow biopsy on 02/26/2020 with slightly hypercellular bone marrow with 23% cells consistent with plasma cells, staining for lambda light chain. No lymphoproliferative disorder. Congo red stain was negative. -Bone marrow cytogenetics were normal. FISH panel for multiple myeloma was negative. -M spike was 0.1 g, immunofixation shows IgG lambda. LDH was elevated at 216. Beta-2 microglobulin 4.5. -Dara-CyBorD started on 03/20/2020.  2. Social/family history: -He works for [employer] and retired 5 years ago. Never smoker. -Maternal aunt had cancer in male organs. One maternal first cousin had appendiceal cancer, another maternal first cousin had cancer of the digestive system.   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 -He has a response based on close to 30% decrease in proteinuria from 22 g to 16 g.  24-hour urine from today is pending. -His latest free lambda light chains improved to 35. -His SPEP from today is pending. -No major toxicity from treatments noted.  No further falls.  He is occasionally weak  on his feet. -Reviewed his labs today.  Creatinine improved to 1.15.  Hemoglobin also 12.1.  He will proceed with his cycle 4 today.  RTC in 3 weeks. -He has follow-up with Dr. Aris Lot on 06/17/2020.  He is also following up with his cardiologist Dr. Alroy Dust in [place].   2. Nausea: -Continue Compazine as needed.  3. Posterior headaches: -Does not report any headaches at this time.  They have resolved after treatment initiated.  4. Sleeping difficulty: -Lately he has been sleeping better.  He is off of sleep aids.  5. Loss of appetite: -He is taking Megace 400 mg once daily and eating better.  6.  Lower extremity swelling: -Continue Lasix 20 mg twice daily.  He has 1+ edema.   Orders placed this encounter:  Orders Placed This Encounter  Procedures   24 hr Ur UPEP/UIFE/Light Chains/TP   Protein electrophoresis, serum     Derek Jack, MD Dene[facility] Lever  [place] [phone]   I, Milinda Antis, am acting as a scribe for Dr. Sanda Linger.  I, Derek Jack MD, have reviewed the above documentation for accuracy and completeness, and I agree with the above.

## 2020-06-12 NOTE — Patient Instructions (Signed)
[facility] Discharge Instructions for Patients Receiving Chemotherapy  Today you received the following chemotherapy agents Velcade, Daratumumab [place] and Cytoxan.   To help prevent nausea and vomiting after your treatment, we encourage you to take your nausea medication    If you develop nausea and vomiting that is not controlled by your nausea medication, call the clinic.   BELOW ARE SYMPTOMS THAT SHOULD BE REPORTED IMMEDIATELY:  *FEVER GREATER THAN 100.5 F  *CHILLS WITH OR WITHOUT FEVER  NAUSEA AND VOMITING THAT IS NOT CONTROLLED WITH YOUR NAUSEA MEDICATION  *UNUSUAL SHORTNESS OF BREATH  *UNUSUAL BRUISING OR BLEEDING  TENDERNESS IN MOUTH AND THROAT WITH OR WITHOUT PRESENCE OF ULCERS  *URINARY PROBLEMS  *BOWEL PROBLEMS  UNUSUAL RASH Items with * indicate a potential emergency and should be followed up as soon as possible.  Feel free to call the clinic should you have any questions or concerns. The clinic phone number is [phone].  Please show the [place] at check-in to the Emergency Department and triage nurse.

## 2020-06-12 NOTE — Progress Notes (Signed)
Patient was assessed by Dr. Katragadda and labs have been reviewed.  Patient is okay to proceed with treatment today. Primary RN and pharmacy aware.   

## 2020-06-12 NOTE — Progress Notes (Signed)
Patient presents today for treatment and follow up visit with Dr. Delton Coombes. Labs reviewed. Vital signs within parameters for treatment. MAR reviewed and updated.   Message received from [place] LPN/ Dr. Dannielle Burn to proceed with treatment. Labs reviewed.   Treatment given today per MD orders. Tolerated infusion without adverse affects. Vital signs stable. No complaints at this time. Discharged from clinic ambulatory in stable condition. Alert and oriented x 3. F/U with [facility] as scheduled.

## 2020-06-12 NOTE — Patient Instructions (Signed)
[place] at [facility] Discharge Instructions  You were seen today by Dr. Delton Coombes. He went over your recent results. You received your treatment today; continue getting your treatment every week. You will be prescribed vitamin D 50,000 units to take once a week; stop taking the daily vitamin D. Dr. Delton Coombes will see you back in 3 weeks for labs and follow up.   Thank you for choosing [place] at [facility] to provide your oncology and hematology care.  To afford each patient quality time with our provider, please arrive at least 15 minutes before your scheduled appointment time.   If you have a lab appointment with the [facility] please come in thru the Main Entrance and check in at the main information desk  You need to re-schedule your appointment should you arrive 10 or more minutes late.  We strive to give you quality time with our providers, and arriving late affects you and other patients whose appointments are after yours.  Also, if you no show three or more times for appointments you may be dismissed from the clinic at the providers discretion.     Again, thank you for choosing [facility].  Our hope is that these requests will decrease the amount of time that you wait before being seen by our physicians.       _____________________________________________________________  Should you have questions after your visit to [facility], please contact our office at [phone] between the hours of 8:00 a.m. and 4:30 p.m.  Voicemails left after 4:00 p.m. will not be returned until the following business day.  For prescription refill requests, have your pharmacy contact our office and allow 72 hours.    Cancer Center Support Programs:   > Cancer Support Group  2nd Tuesday of the month 1pm-2pm, Journey Room

## 2020-06-16 LAB — PROTEIN ELECTROPHORESIS, SERUM
A/G Ratio: 1 (ref 0.7–1.7)
Albumin ELP: 2.1 g/dL — ABNORMAL LOW (ref 2.9–4.4)
Alpha-1-Globulin: 0.2 g/dL (ref 0.0–0.4)
Alpha-2-Globulin: 1.4 g/dL — ABNORMAL HIGH (ref 0.4–1.0)
Beta Globulin: 0.4 g/dL — ABNORMAL LOW (ref 0.7–1.3)
Gamma Globulin: 0.2 g/dL — ABNORMAL LOW (ref 0.4–1.8)
Globulin, Total: 2.2 g/dL (ref 2.2–3.9)
M-Spike, %: 0.1 g/dL — ABNORMAL HIGH
Total Protein ELP: 4.3 g/dL — ABNORMAL LOW (ref 6.0–8.5)

## 2020-06-16 LAB — UPEP/TP, 24-HR URINE
Albumin, U: 74.9 %
Alpha 1, Urine: 7.9 %
Alpha 2, Urine: 6.9 %
Beta, Urine: 9 %
Gamma Globulin, Urine: 1.3 %
Total Protein, Urine-Ur/day: 17449 mg/24 hr — ABNORMAL HIGH (ref 30–150)
Total Protein, Urine: 1057.5 mg/dL
Total Volume: 1650

## 2020-06-19 ENCOUNTER — Other Ambulatory Visit: Payer: Self-pay

## 2020-06-19 ENCOUNTER — Other Ambulatory Visit (HOSPITAL_COMMUNITY): Payer: Self-pay

## 2020-06-19 ENCOUNTER — Inpatient Hospital Stay (HOSPITAL_COMMUNITY): Payer: Medicare Other

## 2020-06-19 VITALS — BP 122/77 | HR 86 | Temp 98.2°F | Resp 18

## 2020-06-19 DIAGNOSIS — Z5111 Encounter for antineoplastic chemotherapy: Secondary | ICD-10-CM | POA: Diagnosis not present

## 2020-06-19 DIAGNOSIS — C9000 Multiple myeloma not having achieved remission: Secondary | ICD-10-CM

## 2020-06-19 LAB — COMPREHENSIVE METABOLIC PANEL
ALT: 14 U/L (ref 0–44)
AST: 15 U/L (ref 15–41)
Albumin: 2.2 g/dL — ABNORMAL LOW (ref 3.5–5.0)
Alkaline Phosphatase: 72 U/L (ref 38–126)
Anion gap: 8 (ref 5–15)
BUN: 24 mg/dL — ABNORMAL HIGH (ref 8–23)
CO2: 24 mmol/L (ref 22–32)
Calcium: 8 mg/dL — ABNORMAL LOW (ref 8.9–10.3)
Chloride: 104 mmol/L (ref 98–111)
Creatinine, Ser: 1.27 mg/dL — ABNORMAL HIGH (ref 0.61–1.24)
GFR, Estimated: >60 mL/min (ref >60)
Glucose, Bld: 89 mg/dL (ref 70–99)
Potassium: 3.7 mmol/L (ref 3.5–5.1)
Sodium: 136 mmol/L (ref 135–145)
Total Bilirubin: 0.8 mg/dL (ref 0.3–1.2)
Total Protein: 5 g/dL — ABNORMAL LOW (ref 6.5–8.1)

## 2020-06-19 LAB — CBC WITH DIFFERENTIAL/PLATELET
Abs Immature Granulocytes: 0.15 10*3/uL — ABNORMAL HIGH (ref 0.00–0.07)
Basophils Absolute: 0.1 10*3/uL (ref 0.0–0.1)
Basophils Relative: 1 %
Eosinophils Absolute: 0.3 10*3/uL (ref 0.0–0.5)
Eosinophils Relative: 2 %
HCT: 32.6 % — ABNORMAL LOW (ref 39.0–52.0)
Hemoglobin: 11.3 g/dL — ABNORMAL LOW (ref 13.0–17.0)
Immature Granulocytes: 1 %
Lymphocytes Relative: 16 %
Lymphs Abs: 2 10*3/uL (ref 0.7–4.0)
MCH: 34.5 pg — ABNORMAL HIGH (ref 26.0–34.0)
MCHC: 34.7 g/dL (ref 30.0–36.0)
MCV: 99.4 fL (ref 80.0–100.0)
Monocytes Absolute: 0.9 10*3/uL (ref 0.1–1.0)
Monocytes Relative: 7 %
Neutro Abs: 9.1 10*3/uL — ABNORMAL HIGH (ref 1.7–7.7)
Neutrophils Relative %: 73 %
Platelets: 187 10*3/uL (ref 150–400)
RBC: 3.28 MIL/uL — ABNORMAL LOW (ref 4.22–5.81)
RDW: 19.6 % — ABNORMAL HIGH (ref 11.5–15.5)
WBC: 12.5 10*3/uL — ABNORMAL HIGH (ref 4.0–10.5)
nRBC: 0 % (ref 0.0–0.2)

## 2020-06-19 LAB — MAGNESIUM: Magnesium: 1.5 mg/dL — ABNORMAL LOW (ref 1.7–2.4)

## 2020-06-19 MED ORDER — SODIUM CHLORIDE 0.9 % IV SOLN
500.0000 mg | Freq: Once | INTRAVENOUS | Status: AC
Start: 2020-06-19 — End: 2020-06-19
  Administered 2020-06-19: 500 mg via INTRAVENOUS
  Filled 2020-06-19: qty 25

## 2020-06-19 MED ORDER — SODIUM CHLORIDE 0.9% FLUSH
10.0000 mL | Freq: Once | INTRAVENOUS | Status: AC
  Administered 2020-06-19: 10 mL via INTRAVENOUS

## 2020-06-19 MED ORDER — BORTEZOMIB CHEMO SQ INJECTION 3.5 MG (2.5MG/ML)
1.3000 mg/m2 | Freq: Once | INTRAMUSCULAR | Status: AC
  Administered 2020-06-19: 2.75 mg via SUBCUTANEOUS
  Filled 2020-06-19: qty 1.1

## 2020-06-19 MED ORDER — SODIUM CHLORIDE 0.9 % IV SOLN
20.0000 mg | Freq: Once | INTRAVENOUS | Status: AC
  Administered 2020-06-19: 20 mg via INTRAVENOUS
  Filled 2020-06-19: qty 20

## 2020-06-19 MED ORDER — SODIUM CHLORIDE 0.9 % IV SOLN: Freq: Once | INTRAVENOUS | Status: AC

## 2020-06-19 MED ORDER — PALONOSETRON HCL INJECTION 0.25 MG/5ML
INTRAVENOUS | Status: AC
  Filled 2020-06-19: qty 5

## 2020-06-19 MED ORDER — PALONOSETRON HCL INJECTION 0.25 MG/5ML
0.2500 mg | Freq: Once | INTRAVENOUS | Status: AC
  Administered 2020-06-19: 0.25 mg via INTRAVENOUS

## 2020-06-19 MED ORDER — MAGNESIUM SULFATE 2 GM/50ML IV SOLN
2.0000 g | Freq: Once | INTRAVENOUS | Status: AC
  Administered 2020-06-19: 2 g via INTRAVENOUS

## 2020-06-19 MED ORDER — MAGNESIUM SULFATE 2 GM/50ML IV SOLN
INTRAVENOUS | Status: AC
  Filled 2020-06-19: qty 50

## 2020-06-19 MED ORDER — HEPARIN SOD (PORK) LOCK FLUSH 100 UNIT/ML IV SOLN
500.0000 [IU] | Freq: Once | INTRAVENOUS | Status: AC
  Administered 2020-06-19: 500 [IU] via INTRAVENOUS

## 2020-06-19 MED ORDER — MAGNESIUM OXIDE 400 (241.3 MG) MG PO TABS: 400.0000 mg | ORAL_TABLET | Freq: Every day | ORAL | 0 refills | Status: DC

## 2020-06-19 NOTE — Progress Notes (Signed)
Patients port flushed without difficulty.  Good blood return noted with no bruising or swelling noted at site.  Patient stable during access and blood draw. 

## 2020-06-19 NOTE — Patient Instructions (Signed)
Cyclophosphamide Infusion What is this medicine? CYCLOPHOSPHAMIDE (sye kloe FOSS fa mide) is a chemotherapy drug. It slows the growth of cancer cells. This medicine is used to treat many types of cancer like lymphoma, myeloma, leukemia, breast cancer, and ovarian cancer, to name a few. This medicine may be used for other purposes; ask your health care provider or pharmacist if you have questions. COMMON BRAND NAME(S): Cytoxan, Neosar What should I tell my health care provider before I take this medicine? They need to know if you have any of these conditions:  heart disease  history of irregular heartbeat  infection  kidney disease  liver disease  low blood counts, like white cells, platelets, or red blood cells  on hemodialysis  recent or ongoing radiation therapy  scarring or thickening of the lungs  trouble passing urine  an unusual or allergic reaction to cyclophosphamide, other medicines, foods, dyes, or preservatives  pregnant or trying to get pregnant  breast-feeding How should I use this medicine? This drug is usually given as an injection into a vein or muscle or by infusion into a vein. It is administered in a hospital or clinic by a specially trained health care professional. Talk to your pediatrician regarding the use of this medicine in children. Special care may be needed. Overdosage: If you think you have taken too much of this medicine contact a poison control center or emergency room at once. NOTE: This medicine is only for you. Do not share this medicine with others. What if I miss a dose? It is important not to miss your dose. Call your doctor or health care professional if you are unable to keep an appointment. What may interact with this medicine?  amphotericin B  azathioprine  certain antivirals for HIV or hepatitis  certain medicines for blood pressure, heart disease, irregular heart beat  certain medicines that treat or prevent blood clots like  warfarin  certain other medicines for cancer  cyclosporine  etanercept  indomethacin  medicines that relax muscles for surgery  medicines to increase blood counts  metronidazole This list may not describe all possible interactions. Give your health care provider a list of all the medicines, herbs, non-prescription drugs, or dietary supplements you use. Also tell them if you smoke, drink alcohol, or use illegal drugs. Some items may interact with your medicine. What should I watch for while using this medicine? Your condition will be monitored carefully while you are receiving this medicine. You may need blood work done while you are taking this medicine. Drink water or other fluids as directed. Urinate often, even at night. Some products may contain alcohol. Ask your health care professional if this medicine contains alcohol. Be sure to tell all health care professionals you are taking this medicine. Certain medicines, like metronidazole and disulfiram, can cause an unpleasant reaction when taken with alcohol. The reaction includes flushing, headache, nausea, vomiting, sweating, and increased thirst. The reaction can last from 30 minutes to several hours. Do not become pregnant while taking this medicine or for 1 year after stopping it. Women should inform their health care professional if they wish to become pregnant or think they might be pregnant. Men should not father a child while taking this medicine and for 4 months after stopping it. There is potential for serious side effects to an unborn child. Talk to your health care professional for more information. Do not breast-feed an infant while taking this medicine or for 1 week after stopping it. This medicine has  caused ovarian failure in some women. This medicine may make it more difficult to get pregnant. Talk to your health care professional if you are concerned about your fertility. This medicine has caused decreased sperm counts in  some men. This may make it more difficult to father a child. Talk to your health care professional if you are concerned about your fertility. Call your health care professional for advice if you get a fever, chills, or sore throat, or other symptoms of a cold or flu. Do not treat yourself. This medicine decreases your body's ability to fight infections. Try to avoid being around people who are sick. Avoid taking medicines that contain aspirin, acetaminophen, ibuprofen, naproxen, or ketoprofen unless instructed by your health care professional. These medicines may hide a fever. Talk to your health care professional about your risk of cancer. You may be more at risk for certain types of cancer if you take this medicine. If you are going to need surgery or other procedure, tell your health care professional that you are using this medicine. Be careful brushing or flossing your teeth or using a toothpick because you may get an infection or bleed more easily. If you have any dental work done, tell your dentist you are receiving this medicine. What side effects may I notice from receiving this medicine? Side effects that you should report to your doctor or health care professional as soon as possible:  allergic reactions like skin rash, itching or hives, swelling of the face, lips, or tongue  breathing problems  nausea, vomiting  signs and symptoms of bleeding such as bloody or black, tarry stools; red or dark brown urine; spitting up blood or brown material that looks like coffee grounds; red spots on the skin; unusual bruising or bleeding from the eyes, gums, or nose  signs and symptoms of heart failure like fast, irregular heartbeat, sudden weight gain; swelling of the ankles, feet, hands  signs and symptoms of infection like fever; chills; cough; sore throat; pain or trouble passing urine  signs and symptoms of kidney injury like trouble passing urine or change in the amount of urine  signs and  symptoms of liver injury like dark yellow or brown urine; general ill feeling or flu-like symptoms; light-colored stools; loss of appetite; nausea; right upper belly pain; unusually weak or tired; yellowing of the eyes or skin Side effects that usually do not require medical attention (report to your doctor or health care professional if they continue or are bothersome):  confusion  decreased hearing  diarrhea  facial flushing  hair loss  headache  loss of appetite  missed menstrual periods  signs and symptoms of low red blood cells or anemia such as unusually weak or tired; feeling faint or lightheaded; falls  skin discoloration This list may not describe all possible side effects. Call your doctor for medical advice about side effects. You may report side effects to FDA at 1-800-FDA-1088. Where should I keep my medicine? This drug is given in a hospital or clinic and will not be stored at home. NOTE: This sheet is a summary. It may not cover all possible information. If you have questions about this medicine, talk to your doctor, pharmacist, or health care provider.  2021 [employer]vier/[employer] Standard (2018-12-25 09:53:29)  

## 2020-06-19 NOTE — Progress Notes (Signed)
Sent magnesium oxide 400 mg once daily per Dr. Delton Coombes request.

## 2020-06-19 NOTE — Progress Notes (Signed)
Patient presents for Cytoxan infusion and Velcade injection.  Vital signs within parameters for treatment.  Labs pending.  Patients only complaint is scattered purple bruising to the left arm.  Dr. Delton Coombes notified.  Labs within parameters for treatment.  Magnesium noted to be 1.5.  Dr. Delton Coombes notified.  Patient also getting 2 grams of magnesium sulfate IV.  Cytoxan, Magnesium Sulfate infusion and Velcade injection given today per MD orders.  Stable during infusion and injection without adverse affects.  Injection site WNL.  Vital signs stable.  No complaints at this time.  Discharge from clinic ambulatory in stable condition.  Alert and oriented X 3.  Follow up with [facility] as scheduled.

## 2020-06-20 ENCOUNTER — Ambulatory Visit (HOSPITAL_COMMUNITY): Payer: Medicare Other

## 2020-06-20 NOTE — Progress Notes (Signed)
Nutrition Follow-up:  Patient with multiple myeloma and receiving DarcyboD  Spoke with patient and wife via phone this afternoon. Reports his appetite is good and he is eating well, recalls bowl of cereal with whole milk and a Boost so far today. This week he has eaten steak, pork loin, spaghetti, drinks 4-5 (16oz) bottles of water, 3 Boost, ginger ale, and coffee. He reports decreased appetite yesterday after treatment, recalls peanut butter crackers, baked chicken, mac/chz, Boost, potato chips, and candy.   Medications: reviewed  Labs: reviewed  Anthropometrics:   Weight 183 lbs decreased 5 lbs from 187 lbs on 2/25  NUTRITION DIAGNOSIS: Inadequate oral intake improving  INTERVENTION:  Patient to continue eating high calorie, high protein foods for weight maintenance  Encouraged patient to increase Boost supplement to 4 times daily    MONITORING, EVALUATION, GOAL: weight trends, intake   NEXT VISIT:  Friday, April 1 via telephone    

## 2020-06-26 ENCOUNTER — Inpatient Hospital Stay (HOSPITAL_COMMUNITY): Payer: Medicare Other

## 2020-06-26 ENCOUNTER — Other Ambulatory Visit: Payer: Self-pay

## 2020-06-26 VITALS — BP 138/78 | HR 87 | Temp 96.8°F | Resp 18 | Wt 187.0 lb

## 2020-06-26 VITALS — BP 123/72 | HR 79 | Temp 97.2°F | Resp 18

## 2020-06-26 DIAGNOSIS — C9000 Multiple myeloma not having achieved remission: Secondary | ICD-10-CM

## 2020-06-26 DIAGNOSIS — Z5111 Encounter for antineoplastic chemotherapy: Secondary | ICD-10-CM | POA: Diagnosis not present

## 2020-06-26 DIAGNOSIS — E7800 Pure hypercholesterolemia, unspecified: Secondary | ICD-10-CM

## 2020-06-26 LAB — CBC WITH DIFFERENTIAL/PLATELET
Abs Immature Granulocytes: 0.16 10*3/uL — ABNORMAL HIGH (ref 0.00–0.07)
Basophils Absolute: 0 10*3/uL (ref 0.0–0.1)
Basophils Relative: 0 %
Eosinophils Absolute: 0.4 10*3/uL (ref 0.0–0.5)
Eosinophils Relative: 3 %
HCT: 31.8 % — ABNORMAL LOW (ref 39.0–52.0)
Hemoglobin: 10.6 g/dL — ABNORMAL LOW (ref 13.0–17.0)
Immature Granulocytes: 1 %
Lymphocytes Relative: 15 %
Lymphs Abs: 1.7 10*3/uL (ref 0.7–4.0)
MCH: 34.1 pg — ABNORMAL HIGH (ref 26.0–34.0)
MCHC: 33.3 g/dL (ref 30.0–36.0)
MCV: 102.3 fL — ABNORMAL HIGH (ref 80.0–100.0)
Monocytes Absolute: 0.8 10*3/uL (ref 0.1–1.0)
Monocytes Relative: 7 %
Neutro Abs: 8.7 10*3/uL — ABNORMAL HIGH (ref 1.7–7.7)
Neutrophils Relative %: 74 %
Platelets: 185 10*3/uL (ref 150–400)
RBC: 3.11 MIL/uL — ABNORMAL LOW (ref 4.22–5.81)
RDW: 19.6 % — ABNORMAL HIGH (ref 11.5–15.5)
WBC: 11.7 10*3/uL — ABNORMAL HIGH (ref 4.0–10.5)
nRBC: 0 % (ref 0.0–0.2)

## 2020-06-26 LAB — VITAMIN D 25 HYDROXY (VIT D DEFICIENCY, FRACTURES): Vit D, 25-Hydroxy: 61.45 ng/mL (ref 30–100)

## 2020-06-26 LAB — COMPREHENSIVE METABOLIC PANEL
ALT: 13 U/L (ref 0–44)
AST: 13 U/L — ABNORMAL LOW (ref 15–41)
Albumin: 2.1 g/dL — ABNORMAL LOW (ref 3.5–5.0)
Alkaline Phosphatase: 91 U/L (ref 38–126)
Anion gap: 7 (ref 5–15)
BUN: 33 mg/dL — ABNORMAL HIGH (ref 8–23)
CO2: 27 mmol/L (ref 22–32)
Calcium: 7.9 mg/dL — ABNORMAL LOW (ref 8.9–10.3)
Chloride: 104 mmol/L (ref 98–111)
Creatinine, Ser: 1.46 mg/dL — ABNORMAL HIGH (ref 0.61–1.24)
GFR, Estimated: 51 mL/min — ABNORMAL LOW (ref >60)
Glucose, Bld: 93 mg/dL (ref 70–99)
Potassium: 4.1 mmol/L (ref 3.5–5.1)
Sodium: 138 mmol/L (ref 135–145)
Total Bilirubin: 0.6 mg/dL (ref 0.3–1.2)
Total Protein: 4.4 g/dL — ABNORMAL LOW (ref 6.5–8.1)

## 2020-06-26 LAB — LIPID PANEL
Cholesterol: 345 mg/dL — ABNORMAL HIGH (ref 0–200)
HDL: 29 mg/dL — ABNORMAL LOW (ref >40)
LDL Cholesterol: UNDETERMINED mg/dL (ref 0–99)
Total CHOL/HDL Ratio: 11.9 RATIO
Triglycerides: 1144 mg/dL — ABNORMAL HIGH (ref <150)
VLDL: UNDETERMINED mg/dL (ref 0–40)

## 2020-06-26 LAB — MAGNESIUM: Magnesium: 1.7 mg/dL (ref 1.7–2.4)

## 2020-06-26 LAB — LDL CHOLESTEROL, DIRECT: Direct LDL: 54.9 mg/dL (ref 0–99)

## 2020-06-26 MED ORDER — ACETAMINOPHEN 325 MG PO TABS
ORAL_TABLET | ORAL | Status: AC
  Filled 2020-06-26: qty 2

## 2020-06-26 MED ORDER — CYCLOPHOSPHAMIDE CHEMO INJECTION 1 GM
500.0000 mg | Freq: Once | INTRAMUSCULAR | Status: AC
Start: 2020-06-26 — End: 2020-06-26
  Administered 2020-06-26: 500 mg via INTRAVENOUS
  Filled 2020-06-26: qty 25

## 2020-06-26 MED ORDER — ACETAMINOPHEN 325 MG PO TABS
650.0000 mg | ORAL_TABLET | Freq: Once | ORAL | Status: AC
  Administered 2020-06-26: 650 mg via ORAL

## 2020-06-26 MED ORDER — PALONOSETRON HCL INJECTION 0.25 MG/5ML
INTRAVENOUS | Status: AC
  Filled 2020-06-26: qty 5

## 2020-06-26 MED ORDER — DARATUMUMAB-HYALURONIDASE-FIHJ 1800-30000 MG-UT/15ML ~~LOC~~ SOLN
1800.0000 mg | Freq: Once | SUBCUTANEOUS | Status: AC
  Administered 2020-06-26: 1800 mg via SUBCUTANEOUS
  Filled 2020-06-26: qty 15

## 2020-06-26 MED ORDER — PALONOSETRON HCL INJECTION 0.25 MG/5ML
0.2500 mg | Freq: Once | INTRAVENOUS | Status: AC
  Administered 2020-06-26: 0.25 mg via INTRAVENOUS

## 2020-06-26 MED ORDER — SODIUM CHLORIDE 0.9 % IV SOLN: Freq: Once | INTRAVENOUS | Status: AC

## 2020-06-26 MED ORDER — DIPHENHYDRAMINE HCL 25 MG PO CAPS
50.0000 mg | ORAL_CAPSULE | Freq: Once | ORAL | Status: AC
  Administered 2020-06-26: 50 mg via ORAL

## 2020-06-26 MED ORDER — SODIUM CHLORIDE 0.9 % IV SOLN
20.0000 mg | Freq: Once | INTRAVENOUS | Status: AC
  Administered 2020-06-26: 20 mg via INTRAVENOUS
  Filled 2020-06-26: qty 20

## 2020-06-26 MED ORDER — DIPHENHYDRAMINE HCL 25 MG PO CAPS
ORAL_CAPSULE | ORAL | Status: AC
  Filled 2020-06-26: qty 2

## 2020-06-26 MED ORDER — BORTEZOMIB CHEMO SQ INJECTION 3.5 MG (2.5MG/ML)
1.3000 mg/m2 | Freq: Once | INTRAMUSCULAR | Status: AC
  Administered 2020-06-26: 2.75 mg via SUBCUTANEOUS
  Filled 2020-06-26: qty 1.1

## 2020-06-26 MED ORDER — HEPARIN SOD (PORK) LOCK FLUSH 100 UNIT/ML IV SOLN
500.0000 [IU] | Freq: Once | INTRAVENOUS | Status: AC
  Administered 2020-06-26: 500 [IU] via INTRAVENOUS

## 2020-06-26 MED ORDER — SODIUM CHLORIDE 0.9% FLUSH
10.0000 mL | Freq: Once | INTRAVENOUS | Status: AC
  Administered 2020-06-26: 10 mL via INTRAVENOUS

## 2020-06-26 NOTE — Patient Instructions (Signed)
[facility] Discharge Instructions for Patients Receiving Chemotherapy  Today you received the following chemotherapy agents Velcade/Daratumumab/Cytoxan  To help prevent nausea and vomiting after your treatment, we encourage you to take your nausea medication    If you develop nausea and vomiting that is not controlled by your nausea medication, call the clinic.   BELOW ARE SYMPTOMS THAT SHOULD BE REPORTED IMMEDIATELY:  *FEVER GREATER THAN 100.5 F  *CHILLS WITH OR WITHOUT FEVER  NAUSEA AND VOMITING THAT IS NOT CONTROLLED WITH YOUR NAUSEA MEDICATION  *UNUSUAL SHORTNESS OF BREATH  *UNUSUAL BRUISING OR BLEEDING  TENDERNESS IN MOUTH AND THROAT WITH OR WITHOUT PRESENCE OF ULCERS  *URINARY PROBLEMS  *BOWEL PROBLEMS  UNUSUAL RASH Items with * indicate a potential emergency and should be followed up as soon as possible.  Feel free to call the clinic should you have any questions or concerns. The clinic phone number is [phone].  Please show the [facility] at check-in to the Emergency Department and triage nurse.

## 2020-06-26 NOTE — Progress Notes (Signed)
Patient presents today for Velcade/Daratumumab injections and Cytoxan infusion.  Vital signs within parameters for treatment.  Labs pending.  Patient still has purple bruising to the left arm and hip area.  No other complaints since last visit.  Labs within parameters for treatment.  Velcade/Daratumumab injections and Cytoxan infusion given today per MD orders.  Stable during injections and infusion without adverse affects.  Injection sites WNL. Vital signs stable.  No complaints at this time.  Discharge from clinic ambulatory in stable condition.  Alert and oriented X 3.  Follow up with [facility] as scheduled.

## 2020-07-01 DIAGNOSIS — I255 Ischemic cardiomyopathy: Secondary | ICD-10-CM | POA: Insufficient documentation

## 2020-07-01 DIAGNOSIS — K219 Gastro-esophageal reflux disease without esophagitis: Secondary | ICD-10-CM | POA: Insufficient documentation

## 2020-07-01 DIAGNOSIS — H8109 Meniere's disease, unspecified ear: Secondary | ICD-10-CM | POA: Insufficient documentation

## 2020-07-01 DIAGNOSIS — D649 Anemia, unspecified: Secondary | ICD-10-CM | POA: Insufficient documentation

## 2020-07-01 DIAGNOSIS — I739 Peripheral vascular disease, unspecified: Secondary | ICD-10-CM | POA: Insufficient documentation

## 2020-07-01 DIAGNOSIS — G459 Transient cerebral ischemic attack, unspecified: Secondary | ICD-10-CM | POA: Insufficient documentation

## 2020-07-03 ENCOUNTER — Other Ambulatory Visit (HOSPITAL_COMMUNITY): Payer: Self-pay | Admitting: *Deleted

## 2020-07-03 ENCOUNTER — Inpatient Hospital Stay (HOSPITAL_BASED_OUTPATIENT_CLINIC_OR_DEPARTMENT_OTHER): Payer: Medicare Other | Admitting: Hematology

## 2020-07-03 ENCOUNTER — Inpatient Hospital Stay (HOSPITAL_COMMUNITY): Payer: Medicare Other

## 2020-07-03 ENCOUNTER — Other Ambulatory Visit: Payer: Self-pay

## 2020-07-03 VITALS — BP 125/78 | HR 81 | Temp 97.4°F | Resp 18

## 2020-07-03 VITALS — BP 127/79 | HR 91 | Temp 98.1°F | Resp 20 | Wt 190.4 lb

## 2020-07-03 DIAGNOSIS — Z5111 Encounter for antineoplastic chemotherapy: Secondary | ICD-10-CM | POA: Diagnosis not present

## 2020-07-03 DIAGNOSIS — C9000 Multiple myeloma not having achieved remission: Secondary | ICD-10-CM | POA: Diagnosis not present

## 2020-07-03 DIAGNOSIS — E8581 Light chain (AL) amyloidosis: Secondary | ICD-10-CM | POA: Diagnosis not present

## 2020-07-03 LAB — COMPREHENSIVE METABOLIC PANEL
ALT: 13 U/L (ref 0–44)
AST: 13 U/L — ABNORMAL LOW (ref 15–41)
Albumin: 2.1 g/dL — ABNORMAL LOW (ref 3.5–5.0)
Alkaline Phosphatase: 74 U/L (ref 38–126)
Anion gap: 7 (ref 5–15)
BUN: 22 mg/dL (ref 8–23)
CO2: 28 mmol/L (ref 22–32)
Calcium: 7.8 mg/dL — ABNORMAL LOW (ref 8.9–10.3)
Chloride: 102 mmol/L (ref 98–111)
Creatinine, Ser: 1.22 mg/dL (ref 0.61–1.24)
GFR, Estimated: >60 mL/min (ref >60)
Glucose, Bld: 83 mg/dL (ref 70–99)
Potassium: 3.8 mmol/L (ref 3.5–5.1)
Sodium: 137 mmol/L (ref 135–145)
Total Bilirubin: 0.4 mg/dL (ref 0.3–1.2)
Total Protein: 4.6 g/dL — ABNORMAL LOW (ref 6.5–8.1)

## 2020-07-03 LAB — CBC WITH DIFFERENTIAL/PLATELET
Abs Immature Granulocytes: 0.12 10*3/uL — ABNORMAL HIGH (ref 0.00–0.07)
Basophils Absolute: 0 10*3/uL (ref 0.0–0.1)
Basophils Relative: 0 %
Eosinophils Absolute: 0.3 10*3/uL (ref 0.0–0.5)
Eosinophils Relative: 3 %
HCT: 30.2 % — ABNORMAL LOW (ref 39.0–52.0)
Hemoglobin: 10.2 g/dL — ABNORMAL LOW (ref 13.0–17.0)
Immature Granulocytes: 1 %
Lymphocytes Relative: 16 %
Lymphs Abs: 1.8 10*3/uL (ref 0.7–4.0)
MCH: 34.8 pg — ABNORMAL HIGH (ref 26.0–34.0)
MCHC: 33.8 g/dL (ref 30.0–36.0)
MCV: 103.1 fL — ABNORMAL HIGH (ref 80.0–100.0)
Monocytes Absolute: 0.7 10*3/uL (ref 0.1–1.0)
Monocytes Relative: 6 %
Neutro Abs: 8.7 10*3/uL — ABNORMAL HIGH (ref 1.7–7.7)
Neutrophils Relative %: 74 %
Platelets: 159 10*3/uL (ref 150–400)
RBC: 2.93 MIL/uL — ABNORMAL LOW (ref 4.22–5.81)
RDW: 18.6 % — ABNORMAL HIGH (ref 11.5–15.5)
WBC: 11.8 10*3/uL — ABNORMAL HIGH (ref 4.0–10.5)
nRBC: 0 % (ref 0.0–0.2)

## 2020-07-03 LAB — MAGNESIUM: Magnesium: 1.7 mg/dL (ref 1.7–2.4)

## 2020-07-03 LAB — VITAMIN B12: Vitamin B-12: 816 pg/mL (ref 180–914)

## 2020-07-03 LAB — VITAMIN D 25 HYDROXY (VIT D DEFICIENCY, FRACTURES): Vit D, 25-Hydroxy: 23.66 ng/mL — ABNORMAL LOW (ref 30–100)

## 2020-07-03 MED ORDER — BORTEZOMIB CHEMO SQ INJECTION 3.5 MG (2.5MG/ML)
1.3000 mg/m2 | Freq: Once | INTRAMUSCULAR | Status: AC
Start: 2020-07-03 — End: 2020-07-03
  Administered 2020-07-03: 2.75 mg via SUBCUTANEOUS
  Filled 2020-07-03: qty 1.1

## 2020-07-03 MED ORDER — PALONOSETRON HCL INJECTION 0.25 MG/5ML
0.2500 mg | Freq: Once | INTRAVENOUS | Status: AC
  Administered 2020-07-03: 0.25 mg via INTRAVENOUS
  Filled 2020-07-03: qty 5

## 2020-07-03 MED ORDER — HYDROCODONE-ACETAMINOPHEN 5-325 MG PO TABS: 1.0000 | ORAL_TABLET | Freq: Three times a day (TID) | ORAL | 0 refills | Status: DC | PRN

## 2020-07-03 MED ORDER — FUROSEMIDE 20 MG PO TABS: 20.0000 mg | ORAL_TABLET | Freq: Two times a day (BID) | ORAL | 2 refills | Status: DC

## 2020-07-03 MED ORDER — SODIUM CHLORIDE 0.9 % IV SOLN
20.0000 mg | Freq: Once | INTRAVENOUS | Status: AC
  Administered 2020-07-03: 20 mg via INTRAVENOUS
  Filled 2020-07-03: qty 20

## 2020-07-03 MED ORDER — SODIUM CHLORIDE 0.9 % IV SOLN
500.0000 mg | Freq: Once | INTRAVENOUS | Status: AC
  Administered 2020-07-03: 500 mg via INTRAVENOUS
  Filled 2020-07-03: qty 25

## 2020-07-03 MED ORDER — SODIUM CHLORIDE 0.9 % IV SOLN: Freq: Once | INTRAVENOUS | Status: AC

## 2020-07-03 MED ORDER — PROCHLORPERAZINE MALEATE 10 MG PO TABS: 10.0000 mg | ORAL_TABLET | Freq: Four times a day (QID) | ORAL | 2 refills | Status: DC | PRN

## 2020-07-03 MED ORDER — TRAMADOL HCL 50 MG PO TABS: ORAL_TABLET | ORAL | 0 refills | Status: DC

## 2020-07-03 MED ORDER — HEPARIN SOD (PORK) LOCK FLUSH 100 UNIT/ML IV SOLN
500.0000 [IU] | Freq: Once | INTRAVENOUS | Status: AC
  Administered 2020-07-03: 500 [IU] via INTRAVENOUS

## 2020-07-03 NOTE — Progress Notes (Signed)
Patient presents today for Velcade injection and Cytoxan infusion.  Vital signs within parameters for treatment.  Labs pending.  Patients only complaint is some swelling in the bilateral lower legs.   Labs within parameters for treatment.  Cytoxan infusion and Velcade injection given today per MD orders.  Stable during injection and infusion without adverse affects.  Injection site WNL.  Vital signs stable.  No complaints at this time.  Discharge from clinic ambulatory in stable condition.  Alert and oriented X 3.  Follow up with [facility] as scheduled.

## 2020-07-03 NOTE — Patient Instructions (Signed)
[facility] at [facility] Discharge Instructions  You were seen today by Dr. Delton Coombes. He went over your recent results. You received your treatment today; continue getting your treatments every week. You have 2-3 more cycles left until you transition to maintenance treatment given every several weeks. Dr. Delton Coombes will see you back in 3 weeks for labs and follow up.   Thank you for choosing [place] at [facility] to provide your oncology and hematology care.  To afford each patient quality time with our provider, please arrive at least 15 minutes before your scheduled appointment time.   If you have a lab appointment with the [facility] please come in thru the Main Entrance and check in at the main information desk  You need to re-schedule your appointment should you arrive 10 or more minutes late.  We strive to give you quality time with our providers, and arriving late affects you and other patients whose appointments are after yours.  Also, if you no show three or more times for appointments you may be dismissed from the clinic at the providers discretion.     Again, thank you for choosing [facility].  Our hope is that these requests will decrease the amount of time that you wait before being seen by our physicians.       _____________________________________________________________  Should you have questions after your visit to [facility], please contact our office at [phone] between the hours of 8:00 a.m. and 4:30 p.m.  Voicemails left after 4:00 p.m. will not be returned until the following business day.  For prescription refill requests, have your pharmacy contact our office and allow 72 hours.    Cancer Center Support Programs:   > Cancer Support Group  2nd Tuesday of the month 1pm-2pm, Journey Room

## 2020-07-03 NOTE — Patient Instructions (Signed)
Cyclophosphamide Infusion What is this medicine? CYCLOPHOSPHAMIDE (sye kloe FOSS fa mide) is a chemotherapy drug. It slows the growth of cancer cells. This medicine is used to treat many types of cancer like lymphoma, myeloma, leukemia, breast cancer, and ovarian cancer, to name a few. This medicine may be used for other purposes; ask your health care provider or pharmacist if you have questions. COMMON BRAND NAME(S): Cytoxan, Neosar What should I tell my health care provider before I take this medicine? They need to know if you have any of these conditions:  heart disease  history of irregular heartbeat  infection  kidney disease  liver disease  low blood counts, like white cells, platelets, or red blood cells  on hemodialysis  recent or ongoing radiation therapy  scarring or thickening of the lungs  trouble passing urine  an unusual or allergic reaction to cyclophosphamide, other medicines, foods, dyes, or preservatives  pregnant or trying to get pregnant  breast-feeding How should I use this medicine? This drug is usually given as an injection into a vein or muscle or by infusion into a vein. It is administered in a hospital or clinic by a specially trained health care professional. Talk to your pediatrician regarding the use of this medicine in children. Special care may be needed. Overdosage: If you think you have taken too much of this medicine contact a poison control center or emergency room at once. NOTE: This medicine is only for you. Do not share this medicine with others. What if I miss a dose? It is important not to miss your dose. Call your doctor or health care professional if you are unable to keep an appointment. What may interact with this medicine?  amphotericin B  azathioprine  certain antivirals for HIV or hepatitis  certain medicines for blood pressure, heart disease, irregular heart beat  certain medicines that treat or prevent blood clots like  warfarin  certain other medicines for cancer  cyclosporine  etanercept  indomethacin  medicines that relax muscles for surgery  medicines to increase blood counts  metronidazole This list may not describe all possible interactions. Give your health care provider a list of all the medicines, herbs, non-prescription drugs, or dietary supplements you use. Also tell them if you smoke, drink alcohol, or use illegal drugs. Some items may interact with your medicine. What should I watch for while using this medicine? Your condition will be monitored carefully while you are receiving this medicine. You may need blood work done while you are taking this medicine. Drink water or other fluids as directed. Urinate often, even at night. Some products may contain alcohol. Ask your health care professional if this medicine contains alcohol. Be sure to tell all health care professionals you are taking this medicine. Certain medicines, like metronidazole and disulfiram, can cause an unpleasant reaction when taken with alcohol. The reaction includes flushing, headache, nausea, vomiting, sweating, and increased thirst. The reaction can last from 30 minutes to several hours. Do not become pregnant while taking this medicine or for 1 year after stopping it. Women should inform their health care professional if they wish to become pregnant or think they might be pregnant. Men should not father a child while taking this medicine and for 4 months after stopping it. There is potential for serious side effects to an unborn child. Talk to your health care professional for more information. Do not breast-feed an infant while taking this medicine or for 1 week after stopping it. This medicine has  caused ovarian failure in some women. This medicine may make it more difficult to get pregnant. Talk to your health care professional if you are concerned about your fertility. This medicine has caused decreased sperm counts in  some men. This may make it more difficult to father a child. Talk to your health care professional if you are concerned about your fertility. Call your health care professional for advice if you get a fever, chills, or sore throat, or other symptoms of a cold or flu. Do not treat yourself. This medicine decreases your body's ability to fight infections. Try to avoid being around people who are sick. Avoid taking medicines that contain aspirin, acetaminophen, ibuprofen, naproxen, or ketoprofen unless instructed by your health care professional. These medicines may hide a fever. Talk to your health care professional about your risk of cancer. You may be more at risk for certain types of cancer if you take this medicine. If you are going to need surgery or other procedure, tell your health care professional that you are using this medicine. Be careful brushing or flossing your teeth or using a toothpick because you may get an infection or bleed more easily. If you have any dental work done, tell your dentist you are receiving this medicine. What side effects may I notice from receiving this medicine? Side effects that you should report to your doctor or health care professional as soon as possible:  allergic reactions like skin rash, itching or hives, swelling of the face, lips, or tongue  breathing problems  nausea, vomiting  signs and symptoms of bleeding such as bloody or black, tarry stools; red or dark brown urine; spitting up blood or brown material that looks like coffee grounds; red spots on the skin; unusual bruising or bleeding from the eyes, gums, or nose  signs and symptoms of heart failure like fast, irregular heartbeat, sudden weight gain; swelling of the ankles, feet, hands  signs and symptoms of infection like fever; chills; cough; sore throat; pain or trouble passing urine  signs and symptoms of kidney injury like trouble passing urine or change in the amount of urine  signs and  symptoms of liver injury like dark yellow or brown urine; general ill feeling or flu-like symptoms; light-colored stools; loss of appetite; nausea; right upper belly pain; unusually weak or tired; yellowing of the eyes or skin Side effects that usually do not require medical attention (report to your doctor or health care professional if they continue or are bothersome):  confusion  decreased hearing  diarrhea  facial flushing  hair loss  headache  loss of appetite  missed menstrual periods  signs and symptoms of low red blood cells or anemia such as unusually weak or tired; feeling faint or lightheaded; falls  skin discoloration This list may not describe all possible side effects. Call your doctor for medical advice about side effects. You may report side effects to FDA at 1-800-FDA-1088. Where should I keep my medicine? This drug is given in a hospital or clinic and will not be stored at home. NOTE: This sheet is a summary. It may not cover all possible information. If you have questions about this medicine, talk to your doctor, pharmacist, or health care provider.  2021 [employer]/Gold Standard (2018-12-25 09:53:29)

## 2020-07-03 NOTE — Progress Notes (Signed)
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  PRIOR THERAPY: None  NGS Results: Not done  CURRENT THERAPY: DaraCyBorD & Aloxi weekly  BRIEF ONCOLOGIC HISTORY:  Oncology History  Multiple myeloma (Julian Miranda)  03/06/2020 Initial Diagnosis   Multiple myeloma (Julian Miranda)   03/06/2020 Cancer Staging   Staging form: Plasma Cell Myeloma and Plasma Cell Disorders, AJCC 8th Edition - Clinical stage from 03/06/2020: RISS Stage II (Beta-2-microglobulin (mg/L): 4.5, Albumin (g/dL): 2.5, ISS: Stage II, High-risk cytogenetics: Absent, LDH: Elevated) - Signed by Derek Jack, MD on 03/06/2020   03/20/2020 - 03/20/2020 Chemotherapy   The patient had dexamethasone (DECADRON) tablet 40 mg, 40 mg, Oral,  Once, 0 of 4 cycles bortezomib SQ (VELCADE) chemo injection (2.79m/mL concentration) 2.75 mg, 1.3 mg/m2 = 2.75 mg, Subcutaneous,  Once, 0 of 4 cycles  for chemotherapy treatment.    03/20/2020 -  Chemotherapy    Patient is on Treatment Plan: PRIMARY AMYLOIDOSIS DARACYBORD (DARATUMUMAB SQ + CYCLOPHOSPHAMIDE PO + BORTEZOMIB SQ + DEXAMETHASONE PO/IV) Q28D X 6 CYCLES / DARATUMUMAB SQ Q28D        CANCER STAGING: Cancer Staging Multiple myeloma ([place] Staging form: Plasma Cell Myeloma and Plasma Cell Disorders, AJCC 8th Edition - Clinical stage from 03/06/2020: RISS Stage II (Beta-2-microglobulin (mg/L): 4.5, Albumin (g/dL): 2.5, ISS: Stage II, High-risk cytogenetics: Absent, LDH: Elevated) - Signed by KDerek Jack MD on 03/06/2020   INTERVAL HISTORY:  Julian Miranda a 70y.o. male, returns for routine follow-up and consideration for next cycle of chemotherapy. TJobywas last seen on 06/12/2020.  Due for day #22 of cycle #4 of DaraCyBorD today.   Today he is accompanied by his wife. Overall,  he tells me he has been feeling fair. [facility]. His legs stay swollen and he continues having pain over the biopsy site. He takes Lasix BID, though he did not take it yesterday due to his procedure. The weakness in his legs is worsening, most prominent when climbing stairs, and he gives out easily when he exerts himself. He denies having N/V/D from chemo and he denies trouble swallowing or mouth swelling. He takes Compazine PRN and Megace BID. He is drinking 2-3 cans of Ensure and his appetite is still decreased, though he is eating more smaller meals. He takes tramadol for mild pain and Norco for severe pain. He denies having any cardiac issues or palpitations. He continues waking up several times during the night to urinate.  Overall, he feels ready for next cycle of chemo today.    REVIEW OF SYSTEMS:  Review of Systems  Constitutional: Positive for appetite change (50%) and fatigue (25%).  HENT:   Negative for lump/mass and trouble swallowing.   Cardiovascular: Positive for leg swelling. Negative for palpitations.  Genitourinary: Positive for nocturia.   Musculoskeletal: Positive for arthralgias (pain over L lower leg biopsy site).  Psychiatric/Behavioral: Positive for sleep disturbance (d/t nocturia).  All other systems reviewed and are negative.   PAST MEDICAL/SURGICAL HISTORY:  Past Medical History:  Diagnosis Date  . CAD (coronary artery disease)   . GERD (gastroesophageal reflux disease)   . Hypertension   . Myocardial infarction ([place] 1994  . Peripheral vascular disease ([facility]   . Stroke ([facility]  Past Surgical History:  Procedure Laterality Date  . BYPASS GRAFT POPLITEAL TO POPLITEAL Right 03/23/2018   Procedure: BYPASS GRAFT RIGHT ABOVE KNEE POPLITEAL TO BELOW KNEE POPLITEAL ARTERY  USING RIGHT GREAT SAPHENOUS VEIN;  Surgeon: Marty Heck, MD;  Location: [place];  Service: Vascular;   Laterality: Right;  . BYPASS GRAFT POPLITEAL TO POPLITEAL Left 04/09/2019   Procedure: BYPASS  ABOVE KNEE POPLITEAL TO BELOW KNEE POPLITEAL AND LIGATION OF LEFT POPITEAL ANEURYSM;  Surgeon: Marty Heck, MD;  Location: [place];  Service: Vascular;  Laterality: Left;  . COLONOSCOPY WITH ESOPHAGOGASTRODUODENOSCOPY (EGD)    . CORONARY ARTERY BYPASS GRAFT    . EYE SURGERY Bilateral    cataracts  . FEMORAL BYPASS Right 03/23/2018  . HERNIA REPAIR    . IR IMAGING GUIDED PORT INSERTION  03/25/2020  . MASTOIDECTOMY    . ROTATOR CUFF REPAIR Right   . TONSILLECTOMY    . VEIN HARVEST Right 03/23/2018   Procedure: VEIN HARVEST RIGHT GREAT SAPHENOUS;  Surgeon: Marty Heck, MD;  Location: [place];  Service: Vascular;  Laterality: Right;  . VEIN HARVEST Left 04/09/2019   Procedure: Harvest Greater Saphenous Vein and  Revise Elisa Lateral Saphenous Vein ;  Surgeon: Marty Heck, MD;  Location: [place];  Service: Vascular;  Laterality: Left;    SOCIAL HISTORY:  Social History   Socioeconomic History  . Marital status: Married    Spouse name: Not on file  . Number of children: Not on file  . Years of education: Not on file  . Highest education level: Not on file  Occupational History  . Not on file  Tobacco Use  . Smoking status: Never Smoker  . Smokeless tobacco: Never Used  Vaping Use  . Vaping Use: Never used  Substance and Sexual Activity  . Alcohol use: Never  . Drug use: Never  . Sexual activity: Not on file  Other Topics Concern  . Not on file  Social History Narrative  . Not on file   Social Determinants of Health   Financial Resource Strain: Low Risk   . Difficulty of Paying Living Expenses: Not hard at all  Food Insecurity: No Food Insecurity  . Worried About Charity fundraiser in the Last Year: Never true  . Ran Out of Food in the Last Year: Never true  Transportation Needs: No Transportation Needs  . Lack of Transportation (Medical): No  . Lack of  Transportation (Non-Medical): No  Physical Activity: Insufficiently Active  . Days of Exercise per Week: 2 days  . Minutes of Exercise per Session: 20 min  Stress: Stress Concern Present  . Feeling of Stress : To some extent  Social Connections: Moderately Integrated  . Frequency of Communication with Friends and Family: More than three times a week  . Frequency of Social Gatherings with Friends and Family: Twice a week  . Attends Religious Services: More than 4 times per year  . Active Member of Clubs or Organizations: No  . Attends Archivist Meetings: Never  . Marital Status: Married  Human resources officer Violence: Not At Risk  . Fear of Current or Ex-Partner: No  . Emotionally Abused: No  . Physically Abused: No  . Sexually Abused: No    FAMILY HISTORY:  Family History  Problem Relation Age of Onset  . Heart disease Mother     CURRENT MEDICATIONS:  Current Outpatient Medications  Medication Sig Dispense Refill  . acyclovir (ZOVIRAX) 400 MG tablet Take  1 tablet (400 mg total) by mouth 2 (two) times daily. 60 tablet 5  . ALPRAZolam (XANAX) 0.25 MG tablet Take 0.25 mg by mouth in the morning, at noon, in the evening, and at bedtime.     Marland Kitchen amLODipine (NORVASC) 10 MG tablet Take 10 mg by mouth daily.    Marland Kitchen aspirin 81 MG tablet Take 81 mg by mouth daily.    Marland Kitchen atorvastatin (LIPITOR) 80 MG tablet Take 80 mg by mouth at bedtime.     . carvedilol (COREG) 25 MG tablet Take 25 mg by mouth daily.     . ergocalciferol (VITAMIN D2) 1.25 MG (50000 UT) capsule Take 1 capsule (50,000 Units total) by mouth once a week. 8 capsule 3  . icosapent Ethyl (VASCEPA) 1 g capsule Take 1 g by mouth 2 (two) times daily.    . Iron-Vitamin C (VITRON-C) 65-125 MG TABS Take 1 tablet by mouth daily.    Marland Kitchen lidocaine (XYLOCAINE) 2 % solution Use as directed 15 mLs in the mouth or throat as needed for mouth pain. 15 mL 3  . lidocaine-prilocaine (EMLA) cream Apply 1 application topically as needed. Apply to  portacath as needed 30 g 0  . lisinopril (ZESTRIL) 20 MG tablet Take 20 mg by mouth in the morning and at bedtime.     . magnesium oxide (MAG-OX) 400 (241.3 Mg) MG tablet Take 1 tablet (400 mg total) by mouth daily. 30 tablet 0  . magnesium oxide (MAG-OX) 400 MG tablet Take by mouth.    . meclizine (ANTIVERT) 25 MG tablet Take 1 tablet (25 mg total) by mouth 2 (two) times daily as needed for dizziness. 30 tablet 3  . megestrol (MEGACE) 400 MG/10ML suspension Take 10 mLs (400 mg total) by mouth 2 (two) times daily. 480 mL 2  . Multiple Minerals (CALCIUM/MAGNESIUM/ZINC PO) Take 1 tablet by mouth at bedtime.    Marland Kitchen omeprazole (PRILOSEC) 20 MG capsule Take 20 mg by mouth 2 (two) times daily.     . ondansetron (ZOFRAN) 4 MG tablet Take 4 mg by mouth every 8 (eight) hours as needed for nausea.    . potassium chloride (KLOR-CON) 10 MEQ tablet Take 10 mEq by mouth 2 (two) times daily.     Marland Kitchen scopolamine (TRANSDERM-SCOP) 1 MG/3DAYS Transderm-Scop 1 mg over 3 days transdermal patch  Apply 1 patch by transdermal route.    . sildenafil (REVATIO) 20 MG tablet Take 20 mg by mouth daily.    . traMADol (ULTRAM) 50 MG tablet TAKE ONE TABLET BY MOUTH EVERY 8 HOURS AS NEEDED FOR SEVERE PAIN 60 tablet 0  . vitamin B-12 (CYANOCOBALAMIN) 1000 MCG tablet Take 1,000 mcg by mouth daily.    Marland Kitchen zinc gluconate 50 MG tablet Take 50 mg by mouth daily.     Marland Kitchen zolpidem (AMBIEN) 10 MG tablet Take 1 tablet (10 mg total) by mouth at bedtime as needed for sleep. 20 tablet 0  . acetaminophen (TYLENOL) 500 MG tablet Take 500 mg by mouth 2 (two) times daily.  (Patient not taking: Reported on 07/03/2020)    . chlorproMAZINE (THORAZINE) 25 MG tablet Take 1 tablet (25 mg total) by mouth 4 (four) times daily as needed. For hiccups (Patient not taking: Reported on 07/03/2020) 60 tablet 2  . diazepam (VALIUM) 2 MG tablet Take 2 mg by mouth daily as needed (dizziness). (Patient not taking: Reported on 07/03/2020)    . furosemide (LASIX) 20 MG tablet  Take 1 tablet (20 mg total) by mouth  2 (two) times daily. 30 tablet 2  . HYDROcodone-acetaminophen (NORCO) 5-325 MG tablet Take 1 tablet by mouth every 8 (eight) hours as needed for moderate pain. (Patient not taking: Reported on 07/03/2020) 60 tablet 0  . prochlorperazine (COMPAZINE) 10 MG tablet Take 1 tablet (10 mg total) by mouth every 6 (six) hours as needed for nausea or vomiting. 60 tablet 2   No current facility-administered medications for this visit.    ALLERGIES:  Allergies  Allergen Reactions  . Vancomycin     Other reaction(s): Red Man Syndrome Noted intraoperatively on 10/04/2018. No associated hemodynamic or respiratory changes. Please give slowly.    PHYSICAL EXAM:  Performance status (ECOG): 1 - Symptomatic but completely ambulatory  Vitals:   07/03/20 0849  BP: 127/79  Pulse: 91  Resp: 20  Temp: 98.1 F (36.7 C)  SpO2: 97%   Wt Readings from Last 3 Encounters:  07/03/20 190 lb 6.4 oz (86.4 kg)  06/26/20 187 lb (84.8 kg)  06/19/20 183 lb 3.2 oz (83.1 kg)   Physical Exam Vitals reviewed.  Constitutional:      Appearance: Normal appearance.  Cardiovascular:     Rate and Rhythm: Normal rate and regular rhythm.     Pulses: Normal pulses.     Heart sounds: Normal heart sounds.  Pulmonary:     Effort: Pulmonary effort is normal.     Breath sounds: Normal breath sounds.  Chest:     Comments: Port-a-Cath in R chest Abdominal:     Palpations: Abdomen is soft. There is no hepatomegaly or mass.     Tenderness: There is no abdominal tenderness.     Hernia: No hernia is present.  Musculoskeletal:     Right lower leg: Edema (1+) present.     Left lower leg: Edema (1+) present.       Legs:  Neurological:     General: No focal deficit present.     Mental Status: He is alert and oriented to person, place, and time.  Psychiatric:        Mood and Affect: Mood normal.        Behavior: Behavior normal.     LABORATORY DATA:  I have reviewed the labs as listed.   CBC Latest Ref Rng & Units 07/03/2020 06/26/2020 06/19/2020  WBC 4.0 - 10.5 K/uL 11.8(H) 11.7(H) 12.5(H)  Hemoglobin 13.0 - 17.0 g/dL 10.2(L) 10.6(L) 11.3(L)  Hematocrit 39.0 - 52.0 % 30.2(L) 31.8(L) 32.6(L)  Platelets 150 - 400 K/uL 159 185 187   CMP Latest Ref Rng & Units 07/03/2020 06/26/2020 06/19/2020  Glucose 70 - 99 mg/dL 83 93 89  BUN 8 - 23 mg/dL 22 33(H) 24(H)  Creatinine 0.61 - 1.24 mg/dL 1.22 1.46(H) 1.27(H)  Sodium 135 - 145 mmol/L 137 138 136  Potassium 3.5 - 5.1 mmol/L 3.8 4.1 3.7  Chloride 98 - 111 mmol/L 102 104 104  CO2 22 - 32 mmol/L 28 27 24   Calcium 8.9 - 10.3 mg/dL 7.8(L) 7.9(L) 8.0(L)  Total Protein 6.5 - 8.1 g/dL 4.6(L) 4.4(L) 5.0(L)  Total Bilirubin 0.3 - 1.2 mg/dL 0.4 0.6 0.8  Alkaline Phos 38 - 126 U/L 74 91 72  AST 15 - 41 U/L 13(L) 13(L) 15  ALT 0 - 44 U/L 13 13 14    Lab Results  Component Value Date   VD25OH 61.45 06/26/2020   VD25OH 13.37 (L) 06/05/2020   VD25OH 13.41 (L) 03/20/2020    DIAGNOSTIC IMAGING:  I have independently reviewed the scans and discussed with  the patient. No results found.   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 Joylene Grapes at [facility]-SPEP 0.2 g, immunofixation with biclonal IgG with lambda specificity. -Free kappa light chain 16.7, lambda light chains 122.7, ratio 0.14. -Kidney biopsy on 02/04/2020 with AL amyloidosis, lambda immunophenotype, Congo red stain positive. Immunofluorescence microscopy shows dense homogeneous glomerular mesangial staining with antisera specific for IgG 1-2+, IgM 2+, C1q 2+, lambda light chains 3+. Waxy homogeneous staining noted along the blood vessels. Congo red stain demonstrates positive apocrine birefringence within the glomerular mesangial regions and along blood vessel walls. -48 pound weight loss since summer 2021 with decreased appetite. Positive for  fatigue. -Reports occasional nausea, no vomiting. Positive for occipital headaches. -Denies any tingling or numbness in the extremities. He has cramps in the left arm which is new. He had leg cramps for a long time. -Cardiac MRI on 02/29/2020 with EF 38%, no evidence of amyloid deposits in the myocardium. -PET scan on 02/26/2020 with scattered borderline mediastinal and hilar lymph nodes with low-level hypermetabolism most likely inflammatory or reactive. No bone abnormalities. No findings in the abdomen or pelvis. -Bone marrow biopsy on 02/26/2020 with slightly hypercellular bone marrow with 23% cells consistent with plasma cells, staining for lambda light chain. No lymphoproliferative disorder. Congo red stain was negative. -Bone marrow cytogenetics were normal. FISH panel for multiple myeloma was negative. -M spike was 0.1 g, immunofixation shows IgG lambda. LDH was elevated at 216. Beta-2 microglobulin 4.5. -Dara-CyBorD started on 03/20/2020.  2. Social/family history: -He works for [employer] and retired 5 years ago. Never smoker. -Maternal aunt had cancer in male organs. One maternal first cousin had appendiceal cancer, another maternal first cousin had cancer of the digestive system.   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 -He is tolerating treatments very well.  No recent falls reported. -Reviewed labs from 06/12/2020 which showed M spike 0.1 g.  Lambda light chains improved to 35. -24-hour urine showed total protein 17.4 g. -We will continue up to 6 cycles of the regimen before switching to maintenance Darzalex. -We will repeat 24-hour urine next week.  Plan to repeat myeloma panel in 2 weeks and RTC in 3 weeks. -Reviewed labs from today which showed creatinine 1.22 and albumin 2.1.  He is also maintaining his weight.  2. Nausea: -Continue  Compazine as needed.  3.  Leg weakness: -He had left sural nerve biopsy on 07/02/2020 at [facility].  We will follow up on the results.  4. Sleeping difficulty: -He has been sleeping better without any medication.  5. Loss of appetite: -Continue Megace 400 mg once daily.  He is also drinking 2 cans of boost per day and eating high-protein diet.  6. Lower extremity swelling: -Continue Lasix 20 mg twice daily.  He has slightly more swelling today as he did not take Lasix for last couple of days.   Orders placed this encounter:  No orders of the defined types were placed in this encounter.    Derek Jack, MD [place] [phone]   I, Milinda Antis, am acting as a scribe for Dr. Sanda Linger.  I, Derek Jack MD, have reviewed the above documentation for accuracy and completeness, and I agree with the above.

## 2020-07-03 NOTE — Progress Notes (Signed)
Patient was assessed by Dr. Katragadda and labs have been reviewed.  Patient is okay to proceed with treatment today. Primary RN and pharmacy aware.   

## 2020-07-04 ENCOUNTER — Ambulatory Visit (HOSPITAL_COMMUNITY): Payer: Medicare Other

## 2020-07-04 NOTE — Progress Notes (Signed)
Nutrition Follow-up:  Patient with multiple myeloma and receiving DarcyboD  Spoke with patient via telephone. He reports having a bit of a rough night after receiving chemotherapy yesterday. Reports having hiccups, now resolved and feeling much better today. Patient reports his appetite has improved. Last night he ate sesame chicken, rice, and 2 egg rolls and has eaten a cinnamon roll this morning. He is drinking 2 Boost Plus daily and recalls eating roast beef, salmon, hamburgers, eggs, peanut butter, milk, cereal over the past week. He is drinking water with every meal as well as throughout the day.   Medications: D2, Lasix 20 mg BID, Mag-ox, Megace, hydrocodone  Labs: reviewed  Anthropometrics: Weight 190 lb 6.4 oz on 3/31 increased 3 lbs from 187 lbs on 3/24  3/17 - 183 lb 3.2 oz 3/10 - 187 lb   NUTRITION DIAGNOSIS: Inadequate oral intake improved   INTERVENTION:  Patient to continue eating high calorie, high protein foods for weight maintenance Continue drinking 2-3 Boost Plus    MONITORING, EVALUATION, GOAL: weight trends, intake   NEXT VISIT: Thursday April 7, during infusion

## 2020-07-10 ENCOUNTER — Inpatient Hospital Stay (HOSPITAL_COMMUNITY): Payer: Medicare Other | Attending: Hematology

## 2020-07-10 ENCOUNTER — Inpatient Hospital Stay (HOSPITAL_COMMUNITY): Payer: Medicare Other

## 2020-07-10 ENCOUNTER — Inpatient Hospital Stay (HOSPITAL_COMMUNITY): Payer: Medicare Other | Admitting: Dietician

## 2020-07-10 ENCOUNTER — Other Ambulatory Visit: Payer: Self-pay

## 2020-07-10 VITALS — BP 133/83 | HR 81 | Resp 18

## 2020-07-10 DIAGNOSIS — R630 Anorexia: Secondary | ICD-10-CM | POA: Diagnosis not present

## 2020-07-10 DIAGNOSIS — Z8673 Personal history of transient ischemic attack (TIA), and cerebral infarction without residual deficits: Secondary | ICD-10-CM | POA: Diagnosis not present

## 2020-07-10 DIAGNOSIS — E8581 Light chain (AL) amyloidosis: Secondary | ICD-10-CM | POA: Insufficient documentation

## 2020-07-10 DIAGNOSIS — Z5111 Encounter for antineoplastic chemotherapy: Secondary | ICD-10-CM | POA: Insufficient documentation

## 2020-07-10 DIAGNOSIS — R531 Weakness: Secondary | ICD-10-CM | POA: Diagnosis not present

## 2020-07-10 DIAGNOSIS — R110 Nausea: Secondary | ICD-10-CM | POA: Diagnosis not present

## 2020-07-10 DIAGNOSIS — R809 Proteinuria, unspecified: Secondary | ICD-10-CM | POA: Diagnosis not present

## 2020-07-10 DIAGNOSIS — R7402 Elevation of levels of lactic acid dehydrogenase (LDH): Secondary | ICD-10-CM | POA: Insufficient documentation

## 2020-07-10 DIAGNOSIS — I2510 Atherosclerotic heart disease of native coronary artery without angina pectoris: Secondary | ICD-10-CM | POA: Diagnosis not present

## 2020-07-10 DIAGNOSIS — Z7982 Long term (current) use of aspirin: Secondary | ICD-10-CM | POA: Insufficient documentation

## 2020-07-10 DIAGNOSIS — C9000 Multiple myeloma not having achieved remission: Secondary | ICD-10-CM | POA: Insufficient documentation

## 2020-07-10 DIAGNOSIS — R519 Headache, unspecified: Secondary | ICD-10-CM | POA: Diagnosis not present

## 2020-07-10 DIAGNOSIS — Z79899 Other long term (current) drug therapy: Secondary | ICD-10-CM | POA: Diagnosis not present

## 2020-07-10 DIAGNOSIS — K219 Gastro-esophageal reflux disease without esophagitis: Secondary | ICD-10-CM | POA: Insufficient documentation

## 2020-07-10 DIAGNOSIS — M7989 Other specified soft tissue disorders: Secondary | ICD-10-CM | POA: Diagnosis not present

## 2020-07-10 DIAGNOSIS — R634 Abnormal weight loss: Secondary | ICD-10-CM | POA: Diagnosis not present

## 2020-07-10 DIAGNOSIS — I10 Essential (primary) hypertension: Secondary | ICD-10-CM | POA: Diagnosis not present

## 2020-07-10 DIAGNOSIS — I739 Peripheral vascular disease, unspecified: Secondary | ICD-10-CM | POA: Diagnosis not present

## 2020-07-10 DIAGNOSIS — R5383 Other fatigue: Secondary | ICD-10-CM | POA: Insufficient documentation

## 2020-07-10 LAB — CBC WITH DIFFERENTIAL/PLATELET
Abs Immature Granulocytes: 0.12 10*3/uL — ABNORMAL HIGH (ref 0.00–0.07)
Basophils Absolute: 0 10*3/uL (ref 0.0–0.1)
Basophils Relative: 0 %
Eosinophils Absolute: 0.4 10*3/uL (ref 0.0–0.5)
Eosinophils Relative: 4 %
HCT: 30.2 % — ABNORMAL LOW (ref 39.0–52.0)
Hemoglobin: 10.3 g/dL — ABNORMAL LOW (ref 13.0–17.0)
Immature Granulocytes: 1 %
Lymphocytes Relative: 13 %
Lymphs Abs: 1.5 10*3/uL (ref 0.7–4.0)
MCH: 35.8 pg — ABNORMAL HIGH (ref 26.0–34.0)
MCHC: 34.1 g/dL (ref 30.0–36.0)
MCV: 104.9 fL — ABNORMAL HIGH (ref 80.0–100.0)
Monocytes Absolute: 0.7 10*3/uL (ref 0.1–1.0)
Monocytes Relative: 6 %
Neutro Abs: 9.2 10*3/uL — ABNORMAL HIGH (ref 1.7–7.7)
Neutrophils Relative %: 76 %
Platelets: 171 10*3/uL (ref 150–400)
RBC: 2.88 MIL/uL — ABNORMAL LOW (ref 4.22–5.81)
RDW: 17.8 % — ABNORMAL HIGH (ref 11.5–15.5)
WBC: 12 10*3/uL — ABNORMAL HIGH (ref 4.0–10.5)
nRBC: 0 % (ref 0.0–0.2)

## 2020-07-10 LAB — COMPREHENSIVE METABOLIC PANEL
ALT: 11 U/L (ref 0–44)
AST: 16 U/L (ref 15–41)
Albumin: 2 g/dL — ABNORMAL LOW (ref 3.5–5.0)
Alkaline Phosphatase: 83 U/L (ref 38–126)
Anion gap: 9 (ref 5–15)
BUN: 19 mg/dL (ref 8–23)
CO2: 27 mmol/L (ref 22–32)
Calcium: 8.1 mg/dL — ABNORMAL LOW (ref 8.9–10.3)
Chloride: 102 mmol/L (ref 98–111)
Creatinine, Ser: 1.17 mg/dL (ref 0.61–1.24)
GFR, Estimated: >60 mL/min (ref >60)
Glucose, Bld: 102 mg/dL — ABNORMAL HIGH (ref 70–99)
Potassium: 3.6 mmol/L (ref 3.5–5.1)
Sodium: 138 mmol/L (ref 135–145)
Total Bilirubin: 0.5 mg/dL (ref 0.3–1.2)
Total Protein: 4.3 g/dL — ABNORMAL LOW (ref 6.5–8.1)

## 2020-07-10 LAB — MAGNESIUM: Magnesium: 1.8 mg/dL (ref 1.7–2.4)

## 2020-07-10 MED ORDER — ACETAMINOPHEN 325 MG PO TABS
650.0000 mg | ORAL_TABLET | Freq: Once | ORAL | Status: AC
  Administered 2020-07-10: 650 mg via ORAL

## 2020-07-10 MED ORDER — HEPARIN SOD (PORK) LOCK FLUSH 100 UNIT/ML IV SOLN
500.0000 [IU] | Freq: Once | INTRAVENOUS | Status: AC
  Administered 2020-07-10: 500 [IU] via INTRAVENOUS

## 2020-07-10 MED ORDER — PALONOSETRON HCL INJECTION 0.25 MG/5ML
INTRAVENOUS | Status: AC
  Filled 2020-07-10: qty 5

## 2020-07-10 MED ORDER — SODIUM CHLORIDE 0.9 % IV SOLN
Freq: Once | INTRAVENOUS | Status: AC
Start: 2020-07-10 — End: 2020-07-10

## 2020-07-10 MED ORDER — DIPHENHYDRAMINE HCL 25 MG PO CAPS
ORAL_CAPSULE | ORAL | Status: AC
  Filled 2020-07-10: qty 2

## 2020-07-10 MED ORDER — PALONOSETRON HCL INJECTION 0.25 MG/5ML
0.2500 mg | Freq: Once | INTRAVENOUS | Status: AC
  Administered 2020-07-10: 0.25 mg via INTRAVENOUS

## 2020-07-10 MED ORDER — SODIUM CHLORIDE 0.9 % IV SOLN
500.0000 mg | Freq: Once | INTRAVENOUS | Status: AC
  Administered 2020-07-10: 500 mg via INTRAVENOUS
  Filled 2020-07-10: qty 25

## 2020-07-10 MED ORDER — SODIUM CHLORIDE 0.9 % IV SOLN
20.0000 mg | Freq: Once | INTRAVENOUS | Status: AC
  Administered 2020-07-10: 20 mg via INTRAVENOUS
  Filled 2020-07-10: qty 20

## 2020-07-10 MED ORDER — BORTEZOMIB CHEMO SQ INJECTION 3.5 MG (2.5MG/ML)
1.3000 mg/m2 | Freq: Once | INTRAMUSCULAR | Status: AC
  Administered 2020-07-10: 2.75 mg via SUBCUTANEOUS
  Filled 2020-07-10: qty 1.1

## 2020-07-10 MED ORDER — ACETAMINOPHEN 325 MG PO TABS
ORAL_TABLET | ORAL | Status: AC
  Filled 2020-07-10: qty 2

## 2020-07-10 MED ORDER — DARATUMUMAB-HYALURONIDASE-FIHJ 1800-30000 MG-UT/15ML ~~LOC~~ SOLN
1800.0000 mg | Freq: Once | SUBCUTANEOUS | Status: AC
  Administered 2020-07-10: 1800 mg via SUBCUTANEOUS
  Filled 2020-07-10: qty 15

## 2020-07-10 MED ORDER — SODIUM CHLORIDE 0.9% FLUSH
10.0000 mL | Freq: Once | INTRAVENOUS | Status: AC
  Administered 2020-07-10: 10 mL via INTRAVENOUS

## 2020-07-10 MED ORDER — DIPHENHYDRAMINE HCL 25 MG PO CAPS
50.0000 mg | ORAL_CAPSULE | Freq: Once | ORAL | Status: AC
  Administered 2020-07-10: 50 mg via ORAL

## 2020-07-10 NOTE — Progress Notes (Signed)
Patient presents today for Cytoxan infusion, Daratumumab and Velcade injections.  Vital signs within parameters for treatment.  Labs pending.  Patient has no new complaints at this time.  Labs within parameters for treatment.  Cytoxan infusion, Daratumumab and Velcade injections  given today per MD orders.  Stable during infusion and injections without adverse affects.  Injection sites WNL.  Vital signs stable.  No complaints at this time.  Discharge from clinic ambulatory in stable condition.  Alert and oriented X 3.  Follow up with [facility] as scheduled.

## 2020-07-10 NOTE — Patient Instructions (Signed)
Cyclophosphamide Infusion What is this medicine? CYCLOPHOSPHAMIDE (sye kloe FOSS fa mide) is a chemotherapy drug. It slows the growth of cancer cells. This medicine is used to treat many types of cancer like lymphoma, myeloma, leukemia, breast cancer, and ovarian cancer, to name a few. This medicine may be used for other purposes; ask your health care provider or pharmacist if you have questions. COMMON BRAND NAME(S): Cytoxan, Neosar What should I tell my health care provider before I take this medicine? They need to know if you have any of these conditions:  heart disease  history of irregular heartbeat  infection  kidney disease  liver disease  low blood counts, like white cells, platelets, or red blood cells  on hemodialysis  recent or ongoing radiation therapy  scarring or thickening of the lungs  trouble passing urine  an unusual or allergic reaction to cyclophosphamide, other medicines, foods, dyes, or preservatives  pregnant or trying to get pregnant  breast-feeding How should I use this medicine? This drug is usually given as an injection into a vein or muscle or by infusion into a vein. It is administered in a hospital or clinic by a specially trained health care professional. Talk to your pediatrician regarding the use of this medicine in children. Special care may be needed. Overdosage: If you think you have taken too much of this medicine contact a poison control center or emergency room at once. NOTE: This medicine is only for you. Do not share this medicine with others. What if I miss a dose? It is important not to miss your dose. Call your doctor or health care professional if you are unable to keep an appointment. What may interact with this medicine?  amphotericin B  azathioprine  certain antivirals for HIV or hepatitis  certain medicines for blood pressure, heart disease, irregular heart beat  certain medicines that treat or prevent blood clots like  warfarin  certain other medicines for cancer  cyclosporine  etanercept  indomethacin  medicines that relax muscles for surgery  medicines to increase blood counts  metronidazole This list may not describe all possible interactions. Give your health care provider a list of all the medicines, herbs, non-prescription drugs, or dietary supplements you use. Also tell them if you smoke, drink alcohol, or use illegal drugs. Some items may interact with your medicine. What should I watch for while using this medicine? Your condition will be monitored carefully while you are receiving this medicine. You may need blood work done while you are taking this medicine. Drink water or other fluids as directed. Urinate often, even at night. Some products may contain alcohol. Ask your health care professional if this medicine contains alcohol. Be sure to tell all health care professionals you are taking this medicine. Certain medicines, like metronidazole and disulfiram, can cause an unpleasant reaction when taken with alcohol. The reaction includes flushing, headache, nausea, vomiting, sweating, and increased thirst. The reaction can last from 30 minutes to several hours. Do not become pregnant while taking this medicine or for 1 year after stopping it. Women should inform their health care professional if they wish to become pregnant or think they might be pregnant. Men should not father a child while taking this medicine and for 4 months after stopping it. There is potential for serious side effects to an unborn child. Talk to your health care professional for more information. Do not breast-feed an infant while taking this medicine or for 1 week after stopping it. This medicine has  caused ovarian failure in some women. This medicine may make it more difficult to get pregnant. Talk to your health care professional if you are concerned about your fertility. This medicine has caused decreased sperm counts in  some men. This may make it more difficult to father a child. Talk to your health care professional if you are concerned about your fertility. Call your health care professional for advice if you get a fever, chills, or sore throat, or other symptoms of a cold or flu. Do not treat yourself. This medicine decreases your body's ability to fight infections. Try to avoid being around people who are sick. Avoid taking medicines that contain aspirin, acetaminophen, ibuprofen, naproxen, or ketoprofen unless instructed by your health care professional. These medicines may hide a fever. Talk to your health care professional about your risk of cancer. You may be more at risk for certain types of cancer if you take this medicine. If you are going to need surgery or other procedure, tell your health care professional that you are using this medicine. Be careful brushing or flossing your teeth or using a toothpick because you may get an infection or bleed more easily. If you have any dental work done, tell your dentist you are receiving this medicine. What side effects may I notice from receiving this medicine? Side effects that you should report to your doctor or health care professional as soon as possible:  allergic reactions like skin rash, itching or hives, swelling of the face, lips, or tongue  breathing problems  nausea, vomiting  signs and symptoms of bleeding such as bloody or black, tarry stools; red or dark brown urine; spitting up blood or brown material that looks like coffee grounds; red spots on the skin; unusual bruising or bleeding from the eyes, gums, or nose  signs and symptoms of heart failure like fast, irregular heartbeat, sudden weight gain; swelling of the ankles, feet, hands  signs and symptoms of infection like fever; chills; cough; sore throat; pain or trouble passing urine  signs and symptoms of kidney injury like trouble passing urine or change in the amount of urine  signs and  symptoms of liver injury like dark yellow or brown urine; general ill feeling or flu-like symptoms; light-colored stools; loss of appetite; nausea; right upper belly pain; unusually weak or tired; yellowing of the eyes or skin Side effects that usually do not require medical attention (report to your doctor or health care professional if they continue or are bothersome):  confusion  decreased hearing  diarrhea  facial flushing  hair loss  headache  loss of appetite  missed menstrual periods  signs and symptoms of low red blood cells or anemia such as unusually weak or tired; feeling faint or lightheaded; falls  skin discoloration This list may not describe all possible side effects. Call your doctor for medical advice about side effects. You may report side effects to FDA at 1-800-FDA-1088. Where should I keep my medicine? This drug is given in a hospital or clinic and will not be stored at home. NOTE: This sheet is a summary. It may not cover all possible information. If you have questions about this medicine, talk to your doctor, pharmacist, or health care provider.  2021 Elsevier/Gold Standard (2018-12-25 09:53:29) Daratumumab injection What is this medicine? DARATUMUMAB (dar a toom ue mab) is a monoclonal antibody. It is used to treat multiple myeloma. This medicine may be used for other purposes; ask your health care provider or pharmacist if you  have questions. COMMON BRAND NAME(S): DARZALEX What should I tell my health care provider before I take this medicine? They need to know if you have any of these conditions:  hereditary fructose intolerance  infection (especially a virus infection such as chickenpox, herpes, or hepatitis B virus)  lung or breathing disease (asthma, COPD)  an unusual or allergic reaction to daratumumab, sorbitol, other medicines, foods, dyes, or preservatives  pregnant or trying to get pregnant  breast-feeding How should I use this  medicine? This medicine is for infusion into a vein. It is given by a health care professional in a hospital or clinic setting. Talk to your pediatrician regarding the use of this medicine in children. Special care may be needed. Overdosage: If you think you have taken too much of this medicine contact a poison control center or emergency room at once. NOTE: This medicine is only for you. Do not share this medicine with others. What if I miss a dose? Keep appointments for follow-up doses as directed. It is important not to miss your dose. Call your doctor or health care professional if you are unable to keep an appointment. What may interact with this medicine? Interactions have not been studied. This list may not describe all possible interactions. Give your health care provider a list of all the medicines, herbs, non-prescription drugs, or dietary supplements you use. Also tell them if you smoke, drink alcohol, or use illegal drugs. Some items may interact with your medicine. What should I watch for while using this medicine? Your condition will be monitored carefully while you are receiving this medicine. This medicine can cause serious allergic reactions. To reduce your risk, your health care provider may give you other medicine to take before receiving this one. Be sure to follow the directions from your health care provider. This medicine can affect the results of blood tests to match your blood type. These changes can last for up to 6 months after the final dose. Your healthcare provider will do blood tests to match your blood type before you start treatment. Tell all of your healthcare providers that you are being treated with this medicine before receiving a blood transfusion. This medicine can affect the results of some tests used to determine treatment response; extra tests may be needed to evaluate response. Do not become pregnant while taking this medicine or for 3 months after stopping  it. Women should inform their health care provider if they wish to become pregnant or think they might be pregnant. There is a potential for serious side effects to an unborn child. Talk to your health care provider for more information. Do not breast-feed an infant while taking this medicine. What side effects may I notice from receiving this medicine? Side effects that you should report to your doctor or health care professional as soon as possible:  allergic reactions (skin rash; itching or hives; swelling of the face, lips, or tongue)  infection (fever, chills, cough, sore throat, pain or difficulty passing urine)  infusion reaction (dizziness, fast heartbeat, feeling faint or lightheaded, falls, headache, increase in blood pressure, nausea, vomiting, or wheezing or trouble breathing with loud or whistling sounds)  unusual bleeding or bruising Side effects that usually do not require medical attention (report to your doctor or health care professional if they continue or are bothersome):  constipation  diarrhea  pain, tingling, numbness in the hands or feet  swelling of the ankles, feet, hands  tiredness This list may not describe all possible  side effects. Call your doctor for medical advice about side effects. You may report side effects to FDA at 1-800-FDA-1088. Where should I keep my medicine? This drug is given in a hospital or clinic and will not be stored at home. NOTE: This sheet is a summary. It may not cover all possible information. If you have questions about this medicine, talk to your doctor, pharmacist, or health care provider.  2021 Elsevier/Gold Standard (2020-03-13 13:28:52) Bortezomib injection What is this medicine? BORTEZOMIB (bor TEZ oh mib) targets proteins in cancer cells and stops the cancer cells from growing. It treats multiple myeloma and mantle cell lymphoma. This medicine may be used for other purposes; ask your health care provider or pharmacist if  you have questions. COMMON BRAND NAME(S): Velcade What should I tell my health care provider before I take this medicine? They need to know if you have any of these conditions:  dehydration  diabetes (high blood sugar)  heart disease  liver disease  tingling of the fingers or toes or other nerve disorder  an unusual or allergic reaction to bortezomib, mannitol, boron, other medicines, foods, dyes, or preservatives  pregnant or trying to get pregnant  breast-feeding How should I use this medicine? This medicine is injected into a vein or under the skin. It is given by a health care provider in a hospital or clinic setting. Talk to your health care provider about the use of this medicine in children. Special care may be needed. Overdosage: If you think you have taken too much of this medicine contact a poison control center or emergency room at once. NOTE: This medicine is only for you. Do not share this medicine with others. What if I miss a dose? Keep appointments for follow-up doses. It is important not to miss your dose. Call your health care provider if you are unable to keep an appointment. What may interact with this medicine? This medicine may interact with the following medications:  ketoconazole  rifampin This list may not describe all possible interactions. Give your health care provider a list of all the medicines, herbs, non-prescription drugs, or dietary supplements you use. Also tell them if you smoke, drink alcohol, or use illegal drugs. Some items may interact with your medicine. What should I watch for while using this medicine? Your condition will be monitored carefully while you are receiving this medicine. You may need blood work done while you are taking this medicine. You may get drowsy or dizzy. Do not drive, use machinery, or do anything that needs mental alertness until you know how this medicine affects you. Do not stand up or sit up quickly, especially  if you are an older patient. This reduces the risk of dizzy or fainting spells This medicine may increase your risk of getting an infection. Call your health care provider for advice if you get a fever, chills, sore throat, or other symptoms of a cold or flu. Do not treat yourself. Try to avoid being around people who are sick. Check with your health care provider if you have severe diarrhea, nausea, and vomiting, or if you sweat a lot. The loss of too much body fluid may make it dangerous for you to take this medicine. Do not become pregnant while taking this medicine or for 7 months after stopping it. Women should inform their health care provider if they wish to become pregnant or think they might be pregnant. Men should not father a child while taking this medicine and for  4 months after stopping it. There is a potential for serious harm to an unborn child. Talk to your health care provider for more information. Do not breast-feed an infant while taking this medicine or for 2 months after stopping it. This medicine may make it more difficult to get pregnant or father a child. Talk to your health care provider if you are concerned about your fertility. What side effects may I notice from receiving this medicine? Side effects that you should report to your doctor or health care professional as soon as possible:  allergic reactions (skin rash; itching or hives; swelling of the face, lips, or tongue)  bleeding (bloody or black, tarry stools; red or dark brown urine; spitting up blood or brown material that looks like coffee grounds; red spots on the skin; unusual bruising or bleeding from the eye, gums, or nose)  blurred vision or changes in vision  confusion  constipation  headache  heart failure (trouble breathing; fast, irregular heartbeat; sudden weight gain; swelling of the ankles, feet, hands)  infection (fever, chills, cough, sore throat, pain or trouble passing urine)  lack or loss of  appetite  liver injury (dark yellow or brown urine; general ill feeling or flu-like symptoms; loss of appetite, right upper belly pain; yellowing of the eyes or skin)  low blood pressure (dizziness; feeling faint or lightheaded, falls; unusually weak or tired)  muscle cramps  pain, redness, or irritation at site where injected  pain, tingling, numbness in the hands or feet  seizures  trouble breathing  unusual bruising or bleeding Side effects that usually do not require medical attention (report to your doctor or health care professional if they continue or are bothersome):  diarrhea  nausea  stomach pain  trouble sleeping  vomiting This list may not describe all possible side effects. Call your doctor for medical advice about side effects. You may report side effects to FDA at 1-800-FDA-1088. Where should I keep my medicine? This medicine is given in a hospital or clinic. It will not be stored at home. NOTE: This sheet is a summary. It may not cover all possible information. If you have questions about this medicine, talk to your doctor, pharmacist, or health care provider.  2021 Elsevier/Gold Standard (2020-03-13 13:22:53)

## 2020-07-15 ENCOUNTER — Other Ambulatory Visit (HOSPITAL_COMMUNITY): Payer: Self-pay

## 2020-07-15 MED ORDER — VITRON-C 65-125 MG PO TABS: 1.0000 | ORAL_TABLET | Freq: Every day | ORAL | 3 refills | Status: DC

## 2020-07-15 MED ORDER — POTASSIUM CHLORIDE ER 10 MEQ PO TBCR: 10.0000 meq | EXTENDED_RELEASE_TABLET | Freq: Two times a day (BID) | ORAL | 3 refills | Status: AC

## 2020-07-17 ENCOUNTER — Inpatient Hospital Stay (HOSPITAL_COMMUNITY): Payer: Medicare Other

## 2020-07-17 ENCOUNTER — Other Ambulatory Visit: Payer: Self-pay

## 2020-07-17 ENCOUNTER — Inpatient Hospital Stay (HOSPITAL_COMMUNITY): Payer: Medicare Other | Admitting: Dietician

## 2020-07-17 VITALS — BP 132/82 | HR 88 | Temp 96.9°F | Resp 18

## 2020-07-17 DIAGNOSIS — E8581 Light chain (AL) amyloidosis: Secondary | ICD-10-CM

## 2020-07-17 DIAGNOSIS — C9000 Multiple myeloma not having achieved remission: Secondary | ICD-10-CM

## 2020-07-17 DIAGNOSIS — Z5111 Encounter for antineoplastic chemotherapy: Secondary | ICD-10-CM | POA: Diagnosis not present

## 2020-07-17 LAB — COMPREHENSIVE METABOLIC PANEL
ALT: 12 U/L (ref 0–44)
AST: 12 U/L — ABNORMAL LOW (ref 15–41)
Albumin: 2 g/dL — ABNORMAL LOW (ref 3.5–5.0)
Alkaline Phosphatase: 102 U/L (ref 38–126)
Anion gap: 8 (ref 5–15)
BUN: 20 mg/dL (ref 8–23)
CO2: 27 mmol/L (ref 22–32)
Calcium: 8 mg/dL — ABNORMAL LOW (ref 8.9–10.3)
Chloride: 102 mmol/L (ref 98–111)
Creatinine, Ser: 1.17 mg/dL (ref 0.61–1.24)
GFR, Estimated: >60 mL/min (ref >60)
Glucose, Bld: 94 mg/dL (ref 70–99)
Potassium: 3.7 mmol/L (ref 3.5–5.1)
Sodium: 137 mmol/L (ref 135–145)
Total Bilirubin: 0.4 mg/dL (ref 0.3–1.2)
Total Protein: 4.5 g/dL — ABNORMAL LOW (ref 6.5–8.1)

## 2020-07-17 LAB — CBC WITH DIFFERENTIAL/PLATELET
Abs Immature Granulocytes: 0.14 10*3/uL — ABNORMAL HIGH (ref 0.00–0.07)
Basophils Absolute: 0 10*3/uL (ref 0.0–0.1)
Basophils Relative: 0 %
Eosinophils Absolute: 0.4 10*3/uL (ref 0.0–0.5)
Eosinophils Relative: 3 %
HCT: 30.3 % — ABNORMAL LOW (ref 39.0–52.0)
Hemoglobin: 10.2 g/dL — ABNORMAL LOW (ref 13.0–17.0)
Immature Granulocytes: 1 %
Lymphocytes Relative: 11 %
Lymphs Abs: 1.3 10*3/uL (ref 0.7–4.0)
MCH: 36 pg — ABNORMAL HIGH (ref 26.0–34.0)
MCHC: 33.7 g/dL (ref 30.0–36.0)
MCV: 107.1 fL — ABNORMAL HIGH (ref 80.0–100.0)
Monocytes Absolute: 0.7 10*3/uL (ref 0.1–1.0)
Monocytes Relative: 6 %
Neutro Abs: 9.2 10*3/uL — ABNORMAL HIGH (ref 1.7–7.7)
Neutrophils Relative %: 79 %
Platelets: 166 10*3/uL (ref 150–400)
RBC: 2.83 MIL/uL — ABNORMAL LOW (ref 4.22–5.81)
RDW: 17.3 % — ABNORMAL HIGH (ref 11.5–15.5)
WBC: 11.8 10*3/uL — ABNORMAL HIGH (ref 4.0–10.5)
nRBC: 0 % (ref 0.0–0.2)

## 2020-07-17 MED ORDER — DEXAMETHASONE SODIUM PHOSPHATE 100 MG/10ML IJ SOLN
20.0000 mg | Freq: Once | INTRAMUSCULAR | Status: AC
  Administered 2020-07-17: 20 mg via INTRAVENOUS
  Filled 2020-07-17: qty 20

## 2020-07-17 MED ORDER — SODIUM CHLORIDE 0.9 % IV SOLN: Freq: Once | INTRAVENOUS | Status: AC

## 2020-07-17 MED ORDER — SODIUM CHLORIDE 0.9 % IV SOLN
500.0000 mg | Freq: Once | INTRAVENOUS | Status: AC
  Administered 2020-07-17: 500 mg via INTRAVENOUS
  Filled 2020-07-17: qty 25

## 2020-07-17 MED ORDER — HEPARIN SOD (PORK) LOCK FLUSH 100 UNIT/ML IV SOLN
500.0000 [IU] | Freq: Once | INTRAVENOUS | Status: AC
  Administered 2020-07-17: 500 [IU] via INTRAVENOUS

## 2020-07-17 MED ORDER — VITRON-C 65-125 MG PO TABS: 1.0000 | ORAL_TABLET | Freq: Every day | ORAL | 3 refills | Status: DC

## 2020-07-17 MED ORDER — PALONOSETRON HCL INJECTION 0.25 MG/5ML
0.2500 mg | Freq: Once | INTRAVENOUS | Status: AC
  Administered 2020-07-17: 0.25 mg via INTRAVENOUS
  Filled 2020-07-17: qty 5

## 2020-07-17 MED ORDER — BORTEZOMIB CHEMO SQ INJECTION 3.5 MG (2.5MG/ML)
1.3000 mg/m2 | Freq: Once | INTRAMUSCULAR | Status: AC
  Administered 2020-07-17: 2.75 mg via SUBCUTANEOUS
  Filled 2020-07-17: qty 1.1

## 2020-07-17 MED ORDER — SODIUM CHLORIDE 0.9% FLUSH
10.0000 mL | Freq: Once | INTRAVENOUS | Status: AC
  Administered 2020-07-17: 10 mL

## 2020-07-17 NOTE — Progress Notes (Signed)
Nutrition Follow-up:  Patient with multiple myeloma. He is receiving DarcyboD  Met with patient and wife in infusion. He reports good appetite and eating well. Patient has eaten a honey bun and pack of nabs today. Yesterday he had 2 sausage biscuits, homemade pimento cheese sandwich, [place] sub, snacked on "trash" (peanut/seed/chex mix), and drank 2 Boost Plus.   Medications: reviewed  Labs: reviewed  Anthropometrics: Weight 193 lb 6.4 oz today decreased 2 lbs in the last 3 weeks, increased 10 lbs in the last month  4/1 - 195 lb 6.4 oz 3/24 - 187 lb  3/17 - 183 lb 3.2 oz 3/10 - 187 lb   NUTRITION DIAGNOSIS: Inadequate oral intake improved   INTERVENTION:  Continue eating high calorie, high protein foods for weight maintenance  Continue drinking 2 Boost Plus daily Sample of CIB and [employer] provided RD contact information given    MONITORING, EVALUATION, GOAL: weight trends, intake   NEXT VISIT: To be determined with treatment

## 2020-07-17 NOTE — Patient Instructions (Signed)
[facility] Discharge Instructions for Patients Receiving Chemotherapy  Today you received the following chemotherapy agents   To help prevent nausea and vomiting after your treatment, we encourage you to take your nausea medication   If you develop nausea and vomiting that is not controlled by your nausea medication, call the clinic.   BELOW ARE SYMPTOMS THAT SHOULD BE REPORTED IMMEDIATELY:  *FEVER GREATER THAN 100.5 F  *CHILLS WITH OR WITHOUT FEVER  NAUSEA AND VOMITING THAT IS NOT CONTROLLED WITH YOUR NAUSEA MEDICATION  *UNUSUAL SHORTNESS OF BREATH  *UNUSUAL BRUISING OR BLEEDING  TENDERNESS IN MOUTH AND THROAT WITH OR WITHOUT PRESENCE OF ULCERS  *URINARY PROBLEMS  *BOWEL PROBLEMS  UNUSUAL RASH Items with * indicate a potential emergency and should be followed up as soon as possible.  Feel free to call the clinic should you have any questions or concerns. The clinic phone number is [phone].  Please show the CHEMO ALERT CARD at check-in to the Emergency Department and triage nurse.   

## 2020-07-17 NOTE — Progress Notes (Signed)
Labs meet parameters for treatment today . No new issues reported by patient today. Will proceed with Day 8 as planned.  Treatment given per orders. Patient tolerated it well without problems. Vitals stable and discharged home from clinic ambulatory. Follow up as scheduled.

## 2020-07-18 LAB — PROTEIN ELECTROPHORESIS, SERUM
A/G Ratio: 1 (ref 0.7–1.7)
Albumin ELP: 1.9 g/dL — ABNORMAL LOW (ref 2.9–4.4)
Alpha-1-Globulin: 0.2 g/dL (ref 0.0–0.4)
Alpha-2-Globulin: 1.1 g/dL — ABNORMAL HIGH (ref 0.4–1.0)
Beta Globulin: 0.5 g/dL — ABNORMAL LOW (ref 0.7–1.3)
Gamma Globulin: 0.2 g/dL — ABNORMAL LOW (ref 0.4–1.8)
Globulin, Total: 2 g/dL — ABNORMAL LOW (ref 2.2–3.9)
M-Spike, %: 0.1 g/dL — ABNORMAL HIGH
Total Protein ELP: 3.9 g/dL — ABNORMAL LOW (ref 6.0–8.5)

## 2020-07-18 LAB — KAPPA/LAMBDA LIGHT CHAINS
Kappa free light chain: 9.3 mg/L (ref 3.3–19.4)
Kappa, lambda light chain ratio: 0.38 (ref 0.26–1.65)
Lambda free light chains: 24.7 mg/L (ref 5.7–26.3)

## 2020-07-21 LAB — UPEP/UIFE/LIGHT CHAINS/TP, 24-HR UR
% BETA, Urine: 10.4 %
ALPHA 1 URINE: 9.5 %
Albumin, U: 72.8 %
Alpha 2, Urine: 6.3 %
Free Kappa Lt Chains,Ur: 31.96 mg/L (ref 1.17–86.46)
Free Kappa/Lambda Ratio: 0.32 — ABNORMAL LOW (ref 1.83–14.26)
Free Lambda Lt Chains,Ur: 101.11 mg/L — ABNORMAL HIGH (ref 0.27–15.21)
GAMMA GLOBULIN URINE: 1 %
Total Protein, Urine-Ur/day: 16884 mg/24 hr — ABNORMAL HIGH (ref 30–150)
Total Protein, Urine: 844.2 mg/dL
Total Volume: 2000

## 2020-07-21 LAB — IMMUNOFIXATION ELECTROPHORESIS
IgA: 22 mg/dL — ABNORMAL LOW (ref 61–437)
IgG (Immunoglobin G), Serum: 227 mg/dL — ABNORMAL LOW (ref 603–1613)
IgM (Immunoglobulin M), Srm: 60 mg/dL (ref 20–172)
Total Protein ELP: 4.2 g/dL — ABNORMAL LOW (ref 6.0–8.5)

## 2020-07-24 ENCOUNTER — Other Ambulatory Visit: Payer: Self-pay

## 2020-07-24 ENCOUNTER — Inpatient Hospital Stay (HOSPITAL_COMMUNITY): Payer: Medicare Other

## 2020-07-24 ENCOUNTER — Inpatient Hospital Stay (HOSPITAL_BASED_OUTPATIENT_CLINIC_OR_DEPARTMENT_OTHER): Payer: Medicare Other | Admitting: Hematology

## 2020-07-24 VITALS — BP 115/74 | HR 80 | Temp 97.0°F | Resp 18

## 2020-07-24 VITALS — BP 113/70 | HR 80 | Temp 96.8°F | Resp 17

## 2020-07-24 DIAGNOSIS — C9000 Multiple myeloma not having achieved remission: Secondary | ICD-10-CM

## 2020-07-24 DIAGNOSIS — Z5111 Encounter for antineoplastic chemotherapy: Secondary | ICD-10-CM | POA: Diagnosis not present

## 2020-07-24 DIAGNOSIS — E8581 Light chain (AL) amyloidosis: Secondary | ICD-10-CM | POA: Diagnosis not present

## 2020-07-24 LAB — CBC WITH DIFFERENTIAL/PLATELET
Abs Immature Granulocytes: 0.14 10*3/uL — ABNORMAL HIGH (ref 0.00–0.07)
Basophils Absolute: 0 10*3/uL (ref 0.0–0.1)
Basophils Relative: 0 %
Eosinophils Absolute: 0.4 10*3/uL (ref 0.0–0.5)
Eosinophils Relative: 3 %
HCT: 28.6 % — ABNORMAL LOW (ref 39.0–52.0)
Hemoglobin: 9.4 g/dL — ABNORMAL LOW (ref 13.0–17.0)
Immature Granulocytes: 1 %
Lymphocytes Relative: 11 %
Lymphs Abs: 1.3 10*3/uL (ref 0.7–4.0)
MCH: 35.6 pg — ABNORMAL HIGH (ref 26.0–34.0)
MCHC: 32.9 g/dL (ref 30.0–36.0)
MCV: 108.3 fL — ABNORMAL HIGH (ref 80.0–100.0)
Monocytes Absolute: 0.8 10*3/uL (ref 0.1–1.0)
Monocytes Relative: 7 %
Neutro Abs: 9.3 10*3/uL — ABNORMAL HIGH (ref 1.7–7.7)
Neutrophils Relative %: 78 %
Platelets: 133 10*3/uL — ABNORMAL LOW (ref 150–400)
RBC: 2.64 MIL/uL — ABNORMAL LOW (ref 4.22–5.81)
RDW: 17.1 % — ABNORMAL HIGH (ref 11.5–15.5)
WBC: 11.9 10*3/uL — ABNORMAL HIGH (ref 4.0–10.5)
nRBC: 0 % (ref 0.0–0.2)

## 2020-07-24 LAB — COMPREHENSIVE METABOLIC PANEL
ALT: 10 U/L (ref 0–44)
AST: 10 U/L — ABNORMAL LOW (ref 15–41)
Albumin: 2.1 g/dL — ABNORMAL LOW (ref 3.5–5.0)
Alkaline Phosphatase: 90 U/L (ref 38–126)
Anion gap: 7 (ref 5–15)
BUN: 22 mg/dL (ref 8–23)
CO2: 27 mmol/L (ref 22–32)
Calcium: 8 mg/dL — ABNORMAL LOW (ref 8.9–10.3)
Chloride: 103 mmol/L (ref 98–111)
Creatinine, Ser: 1.34 mg/dL — ABNORMAL HIGH (ref 0.61–1.24)
GFR, Estimated: 57 mL/min — ABNORMAL LOW (ref >60)
Glucose, Bld: 87 mg/dL (ref 70–99)
Potassium: 4.1 mmol/L (ref 3.5–5.1)
Sodium: 137 mmol/L (ref 135–145)
Total Bilirubin: 0.5 mg/dL (ref 0.3–1.2)
Total Protein: 4.6 g/dL — ABNORMAL LOW (ref 6.5–8.1)

## 2020-07-24 MED ORDER — DIPHENHYDRAMINE HCL 25 MG PO CAPS
ORAL_CAPSULE | ORAL | Status: AC
  Filled 2020-07-24: qty 2

## 2020-07-24 MED ORDER — SODIUM CHLORIDE 0.9 % IV SOLN
20.0000 mg | Freq: Once | INTRAVENOUS | Status: AC
  Administered 2020-07-24: 20 mg via INTRAVENOUS
  Filled 2020-07-24: qty 20

## 2020-07-24 MED ORDER — BORTEZOMIB CHEMO SQ INJECTION 3.5 MG (2.5MG/ML)
1.3000 mg/m2 | Freq: Once | INTRAMUSCULAR | Status: AC
  Administered 2020-07-24: 2.75 mg via SUBCUTANEOUS
  Filled 2020-07-24: qty 1.1

## 2020-07-24 MED ORDER — PALONOSETRON HCL INJECTION 0.25 MG/5ML
0.2500 mg | Freq: Once | INTRAVENOUS | Status: AC
  Administered 2020-07-24: 0.25 mg via INTRAVENOUS

## 2020-07-24 MED ORDER — ACETAMINOPHEN 325 MG PO TABS
ORAL_TABLET | ORAL | Status: AC
  Filled 2020-07-24: qty 1

## 2020-07-24 MED ORDER — HEPARIN SOD (PORK) LOCK FLUSH 100 UNIT/ML IV SOLN
500.0000 [IU] | Freq: Once | INTRAVENOUS | Status: AC
  Administered 2020-07-24: 500 [IU] via INTRAVENOUS

## 2020-07-24 MED ORDER — DARATUMUMAB-HYALURONIDASE-FIHJ 1800-30000 MG-UT/15ML ~~LOC~~ SOLN
1800.0000 mg | Freq: Once | SUBCUTANEOUS | Status: AC
  Administered 2020-07-24: 1800 mg via SUBCUTANEOUS
  Filled 2020-07-24: qty 15

## 2020-07-24 MED ORDER — ACETAMINOPHEN 325 MG PO TABS
650.0000 mg | ORAL_TABLET | Freq: Once | ORAL | Status: AC
Start: 2020-07-24 — End: 2020-07-24
  Administered 2020-07-24: 650 mg via ORAL

## 2020-07-24 MED ORDER — DIPHENHYDRAMINE HCL 25 MG PO CAPS
50.0000 mg | ORAL_CAPSULE | Freq: Once | ORAL | Status: AC
Start: 2020-07-24 — End: 2020-07-24
  Administered 2020-07-24: 50 mg via ORAL

## 2020-07-24 MED ORDER — SODIUM CHLORIDE 0.9 % IV SOLN
Freq: Once | INTRAVENOUS | Status: AC
Start: 2020-07-24 — End: 2020-07-24

## 2020-07-24 MED ORDER — PALONOSETRON HCL INJECTION 0.25 MG/5ML
INTRAVENOUS | Status: AC
  Filled 2020-07-24: qty 5

## 2020-07-24 MED ORDER — SODIUM CHLORIDE 0.9 % IV SOLN
500.0000 mg | Freq: Once | INTRAVENOUS | Status: AC
  Administered 2020-07-24: 500 mg via INTRAVENOUS
  Filled 2020-07-24: qty 25

## 2020-07-24 MED ORDER — SODIUM CHLORIDE 0.9% FLUSH
10.0000 mL | Freq: Once | INTRAVENOUS | Status: AC
  Administered 2020-07-24: 10 mL via INTRAVENOUS

## 2020-07-24 NOTE — Patient Instructions (Addendum)
[place] at [facility] Discharge Instructions  You were seen today by Dr. Delton Coombes. He went over your recent results. You received your treatment today; continue getting your treatment every week. Wear compression socks during the day to control the leg swelling. Take 2 tablets of Lasix in the morning if your leg swelling worsens. Keep your endoscopy in [facility] on May 2nd. Dr. Delton Coombes will see you back in 3 weeks for labs and follow up.   Thank you for choosing [place] at [facility] to provide your oncology and hematology care.  To afford each patient quality time with our provider, please arrive at least 15 minutes before your scheduled appointment time.   If you have a lab appointment with the [facility] please come in thru the Main Entrance and check in at the main information desk  You need to re-schedule your appointment should you arrive 10 or more minutes late.  We strive to give you quality time with our providers, and arriving late affects you and other patients whose appointments are after yours.  Also, if you no show three or more times for appointments you may be dismissed from the clinic at the providers discretion.     Again, thank you for choosing [facility].  Our hope is that these requests will decrease the amount of time that you wait before being seen by our physicians.       _____________________________________________________________  Should you have questions after your visit to [facility], please contact our office at [phone] between the hours of 8:00 a.m. and 4:30 p.m.  Voicemails left after 4:00 p.m. will not be returned until the following business day.  For prescription refill requests, have your pharmacy contact our office and allow 72 hours.    Cancer Center Support Programs:   > Cancer Support Group  2nd Tuesday of the month 1pm-2pm, Journey Room

## 2020-07-24 NOTE — Patient Instructions (Signed)
[place] Discharge Instructions for Patients Receiving Chemotherapy  Today you received the following chemotherapy agents Cytoxan infusion/Velcade and Daratumumab injections.  To help prevent nausea and vomiting after your treatment, we encourage you to take your nausea medication    If you develop nausea and vomiting that is not controlled by your nausea medication, call the clinic.   BELOW ARE SYMPTOMS THAT SHOULD BE REPORTED IMMEDIATELY:  *FEVER GREATER THAN 100.5 F  *CHILLS WITH OR WITHOUT FEVER  NAUSEA AND VOMITING THAT IS NOT CONTROLLED WITH YOUR NAUSEA MEDICATION  *UNUSUAL SHORTNESS OF BREATH  *UNUSUAL BRUISING OR BLEEDING  TENDERNESS IN MOUTH AND THROAT WITH OR WITHOUT PRESENCE OF ULCERS  *URINARY PROBLEMS  *BOWEL PROBLEMS  UNUSUAL RASH Items with * indicate a potential emergency and should be followed up as soon as possible.  Feel free to call the clinic should you have any questions or concerns. The clinic phone number is [phone].  Please show the [place] at check-in to the Emergency Department and triage nurse.    [place] at [facility] Discharge Instructions   Thank you for choosing [employer] at [facility] to provide your oncology and hematology care.  To afford each patient quality time with our provider, please arrive at least 15 minutes before your scheduled appointment time.   If you have a lab appointment with the [facility] please come in thru the Main Entrance and check in at the main information desk.  You need to re-schedule your appointment should you arrive 10 or more minutes late.  We strive to give you quality time with our providers, and arriving late affects you and other patients whose appointments are after yours.  Also, if you no show three or more times for appointments you may be dismissed from the clinic at the providers discretion.     Again, thank you  for choosing [facility].  Our hope is that these requests will decrease the amount of time that you wait before being seen by our physicians.       _____________________________________________________________  Should you have questions after your visit to [facility], please contact our office at [phone] and follow the prompts.  Our office hours are 8:00 a.m. and 4:30 p.m. Monday - Friday.  Please note that voicemails left after 4:00 p.m. may not be returned until the following business day.  We are closed weekends and major holidays.  You do have access to a nurse 24-7, just call the main number to the clinic [phone] and do not press any options, hold on the line and a nurse will answer the phone.    For prescription refill requests, have your pharmacy contact our office and allow 72 hours.    Due to Covid, you will need to wear a mask upon entering the hospital. If you do not have a mask, a mask will be given to you at the Main Entrance upon arrival. For doctor visits, patients may have 1 support person age 42 or older with them. For treatment visits, patients can not have anyone with them due to social distancing guidelines and our immunocompromised population.

## 2020-07-24 NOTE — Progress Notes (Signed)
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)  03/06/2020 Initial Diagnosis   Multiple myeloma ([place])   03/06/2020 Cancer Staging   Staging form: Plasma Cell Myeloma and Plasma Cell Disorders, AJCC 8th Edition - Clinical stage from 03/06/2020: RISS Stage II (Beta-2-microglobulin (mg/L): 4.5, Albumin (g/dL): 2.5, ISS: Stage II, High-risk cytogenetics: Absent, LDH: Elevated) - Signed by Derek Jack, MD on 03/06/2020   03/20/2020 - 03/20/2020 Chemotherapy   The patient had dexamethasone (DECADRON) tablet 40 mg, 40 mg, Oral,  Once, 0 of 4 cycles bortezomib SQ (VELCADE) chemo injection (2.56m/mL concentration) 2.75 mg, 1.3 mg/m2 = 2.75 mg, Subcutaneous,  Once, 0 of 4 cycles  for chemotherapy treatment.    03/20/2020 -  Chemotherapy    Patient is on Treatment Plan: PRIMARY AMYLOIDOSIS DARACYBORD (DARATUMUMAB SQ + CYCLOPHOSPHAMIDE PO + BORTEZOMIB SQ + DEXAMETHASONE PO/IV) Q28D X 6 CYCLES / DARATUMUMAB SQ Q28D        CANCER STAGING: Cancer Staging Multiple myeloma ([place] Staging form: Plasma Cell Myeloma and Plasma Cell Disorders, AJCC 8th Edition - Clinical stage from 03/06/2020: RISS Stage II (Beta-2-microglobulin (mg/L): 4.5, Albumin (g/dL): 2.5, ISS: Stage II, High-risk cytogenetics: Absent, LDH: Elevated) - Signed by KDerek Jack MD on 03/06/2020   INTERVAL HISTORY:  [name] a 70y.o. male, returns for routine follow-up and consideration for next cycle of chemotherapy. TRawsonwas last seen on 07/03/2020.  Due for day #15 of cycle #5 of DaraCyBorD and Aloxi today.   Today he is accompanied by his wife.  Overall, he tells me he has been feeling okay. He reports that the pin hole weeping from his legs stopped after applying iodine and wrapping. His leg swelling is still the same, even when he first wakes up in the morning, and he has compression socks at home. He still has proximal leg weakness and pain when he walks, so he takes tramadol for the pain and Norco PRN for severe pain. He reports having loose BM's 2 days after chemo which requires him to wear Depends, but denies having N/V/D, numbness or tingling. His sleep has slightly improved. His appetite is excellent and he is still taking Megace and drinking 2 cans of Boost per day; he is trying to eat more protein daily. He is taking vitamin D 50,000 units weekly.  He also reports being depressed from thinking how much time he has left, how much his condition can improve, and leaving his wife as a widow. He thinks that his limiting factor is his leg weakness.  [facility]  Overall, he feels ready for next cycle of chemo today.    REVIEW OF SYSTEMS:  Review of Systems  Constitutional: Positive for fatigue (50%). Negative for appetite change.  Cardiovascular: Positive for leg swelling (stable).  Gastrointestinal: Negative for diarrhea (loose BM's 2 days after chemo), nausea and vomiting.  Neurological: Positive for extremity weakness (leg weakness). Negative for numbness.  Psychiatric/Behavioral: Positive for depression and sleep disturbance (improving).  All other systems reviewed and are negative.   PAST MEDICAL/SURGICAL HISTORY:  Past Medical History:  Diagnosis Date  .  CAD (coronary artery disease)   . GERD (gastroesophageal reflux disease)   . Hypertension   . Myocardial infarction ([place]) 1994  . Peripheral vascular disease ([place])   . Stroke [facility])    Past Surgical History:  Procedure Laterality Date  . BYPASS GRAFT POPLITEAL TO POPLITEAL Right 03/23/2018   Procedure: BYPASS GRAFT RIGHT  ABOVE KNEE POPLITEAL TO BELOW KNEE POPLITEAL ARTERY  USING RIGHT GREAT SAPHENOUS VEIN;  Surgeon: Marty Heck, MD;  Location: [place];  Service: Vascular;  Laterality: Right;  . BYPASS GRAFT POPLITEAL TO POPLITEAL Left 04/09/2019   Procedure: BYPASS  ABOVE KNEE POPLITEAL TO BELOW KNEE POPLITEAL AND LIGATION OF LEFT POPITEAL ANEURYSM;  Surgeon: Marty Heck, MD;  Location: [place];  Service: Vascular;  Laterality: Left;  . COLONOSCOPY WITH ESOPHAGOGASTRODUODENOSCOPY (EGD)    . CORONARY ARTERY BYPASS GRAFT    . EYE SURGERY Bilateral    cataracts  . FEMORAL BYPASS Right 03/23/2018  . HERNIA REPAIR    . IR IMAGING GUIDED PORT INSERTION  03/25/2020  . MASTOIDECTOMY    . ROTATOR CUFF REPAIR Right   . TONSILLECTOMY    . VEIN HARVEST Right 03/23/2018   Procedure: VEIN HARVEST RIGHT GREAT SAPHENOUS;  Surgeon: Marty Heck, MD;  Location: [place];  Service: Vascular;  Laterality: Right;  . VEIN HARVEST Left 04/09/2019   Procedure: Harvest Greater Saphenous Vein and  Revise Elisa Lateral Saphenous Vein ;  Surgeon: Marty Heck, MD;  Location: [place];  Service: Vascular;  Laterality: Left;    SOCIAL HISTORY:  Social History   Socioeconomic History  . Marital status: Married    Spouse name: Not on file  . Number of children: Not on file  . Years of education: Not on file  . Highest education level: Not on file  Occupational History  . Not on file  Tobacco Use  . Smoking status: Never Smoker  . Smokeless tobacco: Never Used  Vaping Use  . Vaping Use: Never used  Substance and Sexual Activity  . Alcohol use: Never  . Drug use: Never  . Sexual activity: Not on file  Other Topics Concern  . Not on file  Social History Narrative  . Not on file   Social Determinants of Health   Financial Resource Strain: Low Risk   . Difficulty of Paying Living Expenses: Not hard at all  Food Insecurity: No Food Insecurity  . Worried About Charity fundraiser in the Last Year: Never  true  . Ran Out of Food in the Last Year: Never true  Transportation Needs: No Transportation Needs  . Lack of Transportation (Medical): No  . Lack of Transportation (Non-Medical): No  Physical Activity: Insufficiently Active  . Days of Exercise per Week: 2 days  . Minutes of Exercise per Session: 20 min  Stress: Stress Concern Present  . Feeling of Stress : To some extent  Social Connections: Moderately Integrated  . Frequency of Communication with Friends and Family: More than three times a week  . Frequency of Social Gatherings with Friends and Family: Twice a week  . Attends Religious Services: More than 4 times per year  . Active Member of Clubs or Organizations: No  . Attends Archivist Meetings: Never  . Marital Status: Married  Human resources officer Violence: Not At Risk  . Fear of Current or Ex-Partner: No  . Emotionally Abused: No  . Physically Abused: No  . Sexually Abused: No    FAMILY HISTORY:  Family History  Problem Relation Age of Onset  . Heart disease Mother     CURRENT MEDICATIONS:  Current Outpatient Medications  Medication Sig Dispense Refill  . acetaminophen (TYLENOL) 500 MG tablet Take 500 mg by mouth 2 (two) times daily.    Marland Kitchen acyclovir (ZOVIRAX) 400 MG tablet Take 1 tablet (400 mg total) by mouth 2 (two) times daily. 60 tablet 5  . ALPRAZolam (XANAX) 0.25 MG tablet Take 0.25 mg by mouth in the morning, at noon, in the evening, and at bedtime.     Marland Kitchen amLODipine (NORVASC) 10 MG tablet Take 10 mg by mouth daily.    Marland Kitchen aspirin 81 MG tablet Take 81 mg by mouth daily.    Marland Kitchen atorvastatin (LIPITOR) 80 MG tablet Take 80 mg by mouth at bedtime.     . carvedilol (COREG) 25 MG tablet Take 25 mg by mouth daily.     . chlorproMAZINE (THORAZINE) 25 MG tablet Take 1 tablet (25 mg total) by mouth 4 (four) times daily as needed. For hiccups 60 tablet 2  . diazepam (VALIUM) 2 MG tablet Take 2 mg by mouth daily as needed (dizziness).    . ergocalciferol (VITAMIN D2)  1.25 MG (50000 UT) capsule Take 1 capsule (50,000 Units total) by mouth once a week. 8 capsule 3  . furosemide (LASIX) 20 MG tablet Take 1 tablet (20 mg total) by mouth 2 (two) times daily. 30 tablet 2  . HYDROcodone-acetaminophen (NORCO) 5-325 MG tablet Take 1 tablet by mouth every 8 (eight) hours as needed for moderate pain. 60 tablet 0  . icosapent Ethyl (VASCEPA) 1 g capsule Take 1 g by mouth 2 (two) times daily.    . Iron-Vitamin C (VITRON-C) 65-125 MG TABS Take 1 tablet by mouth daily. 60 tablet 3  . lidocaine (XYLOCAINE) 2 % solution Use as directed 15 mLs in the mouth or throat as needed for mouth pain. 15 mL 3  . lidocaine-prilocaine (EMLA) cream Apply 1 application topically as needed. Apply to portacath as needed 30 g 0  . lisinopril (ZESTRIL) 20 MG tablet Take 20 mg by mouth in the morning and at bedtime.     . magnesium oxide (MAG-OX) 400 (241.3 Mg) MG tablet Take 1 tablet (400 mg total) by mouth daily. 30 tablet 0  . magnesium oxide (MAG-OX) 400 MG tablet Take by mouth.    . meclizine (ANTIVERT) 25 MG tablet Take 1 tablet (25 mg total) by mouth 2 (two) times daily as needed for dizziness. 30 tablet 3  . megestrol (MEGACE) 400 MG/10ML suspension Take 10 mLs (400 mg total) by mouth 2 (two) times daily. 480 mL 2  . Multiple Minerals (CALCIUM/MAGNESIUM/ZINC PO) Take 1 tablet by mouth at bedtime.    Marland Kitchen omeprazole (PRILOSEC) 20 MG capsule Take 20 mg by mouth 2 (two) times daily.     . ondansetron (ZOFRAN) 4 MG tablet Take 4 mg by mouth every 8 (eight) hours as needed for nausea.    . potassium chloride (KLOR-CON) 10 MEQ tablet Take 1 tablet (10 mEq total) by mouth 2 (two) times daily. 90 tablet 3  . prochlorperazine (COMPAZINE) 10 MG tablet Take 1 tablet (10 mg total) by mouth every 6 (six) hours as needed for nausea or vomiting. 60 tablet 2  . scopolamine (TRANSDERM-SCOP) 1 MG/3DAYS Transderm-Scop 1 mg over 3 days transdermal patch  Apply 1 patch by transdermal route.    . sildenafil  (REVATIO) 20 MG tablet Take 20 mg by mouth daily.    Marland Kitchen  traMADol (ULTRAM) 50 MG tablet TAKE ONE TABLET BY MOUTH EVERY 8 HOURS AS NEEDED FOR SEVERE PAIN 60 tablet 0  . vitamin B-12 (CYANOCOBALAMIN) 1000 MCG tablet Take 1,000 mcg by mouth daily.    Marland Kitchen zinc gluconate 50 MG tablet Take 50 mg by mouth daily.     Marland Kitchen zolpidem (AMBIEN) 10 MG tablet Take 1 tablet (10 mg total) by mouth at bedtime as needed for sleep. 20 tablet 0   No current facility-administered medications for this visit.    ALLERGIES:  Allergies  Allergen Reactions  . Vancomycin     Other reaction(s): Red Man Syndrome Noted intraoperatively on 10/04/2018. No associated hemodynamic or respiratory changes. Please give slowly.    PHYSICAL EXAM:  Performance status (ECOG): 1 - Symptomatic but completely ambulatory  Vitals:   07/24/20 0856  BP: 113/70  Pulse: 80  Resp: 17  Temp: (!) 96.8 F (36 C)  SpO2: 97%   Wt Readings from Last 3 Encounters:  07/24/20 194 lb 9.6 oz (88.3 kg)  07/17/20 193 lb 6.4 oz (87.7 kg)  07/10/20 195 lb 6.4 oz (88.6 kg)   Physical Exam Vitals reviewed.  Constitutional:      Appearance: Normal appearance.  Cardiovascular:     Rate and Rhythm: Normal rate and regular rhythm.     Pulses: Normal pulses.     Heart sounds: Normal heart sounds.  Pulmonary:     Effort: Pulmonary effort is normal.     Breath sounds: Normal breath sounds.  Chest:     Comments: Port-a-Cath in R chest Abdominal:     Palpations: Abdomen is soft. There is no hepatomegaly, splenomegaly or mass.     Tenderness: There is no abdominal tenderness.     Hernia: No hernia is present.  Musculoskeletal:     Right lower leg: Edema (2+) present.     Left lower leg: Edema (2+) present.  Neurological:     General: No focal deficit present.     Mental Status: He is alert and oriented to person, place, and time.  Psychiatric:        Mood and Affect: Mood is depressed.        Behavior: Behavior normal.     LABORATORY DATA:   I have reviewed the labs as listed.  CBC Latest Ref Rng & Units 07/24/2020 07/17/2020 07/10/2020  WBC 4.0 - 10.5 K/uL 11.9(H) 11.8(H) 12.0(H)  Hemoglobin 13.0 - 17.0 g/dL 9.4(L) 10.2(L) 10.3(L)  Hematocrit 39.0 - 52.0 % 28.6(L) 30.3(L) 30.2(L)  Platelets 150 - 400 K/uL 133(L) 166 171   CMP Latest Ref Rng & Units 07/24/2020 07/17/2020 07/10/2020  Glucose 70 - 99 mg/dL 87 94 102(H)  BUN 8 - 23 mg/dL 22 20 19   Creatinine 0.61 - 1.24 mg/dL 1.34(H) 1.17 1.17  Sodium 135 - 145 mmol/L 137 137 138  Potassium 3.5 - 5.1 mmol/L 4.1 3.7 3.6  Chloride 98 - 111 mmol/L 103 102 102  CO2 22 - 32 mmol/L 27 27 27   Calcium 8.9 - 10.3 mg/dL 8.0(L) 8.0(L) 8.1(L)  Total Protein 6.5 - 8.1 g/dL 4.6(L) 4.5(L) 4.3(L)  Total Bilirubin 0.3 - 1.2 mg/dL 0.5 0.4 0.5  Alkaline Phos 38 - 126 U/L 90 102 83  AST 15 - 41 U/L 10(L) 12(L) 16  ALT 0 - 44 U/L 10 12 11    Lab Results  Component Value Date   TOTALPROTELP 3.9 (L) 07/17/2020   ALBUMINELP 1.9 (L) 07/17/2020   A1GS 0.2 07/17/2020   A2GS 1.1 (H)  07/17/2020   BETS 0.5 (L) 07/17/2020   GAMS 0.2 (L) 07/17/2020   MSPIKE 0.1 (H) 07/17/2020   SPEI Comment 07/17/2020    Lab Results  Component Value Date   KPAFRELGTCHN 9.3 07/17/2020   LAMBDASER 24.7 07/17/2020   KAPLAMBRATIO 0.38 07/17/2020   KAPLAMBRATIO 0.32 (L) 07/17/2020    DIAGNOSTIC IMAGING:  I have independently reviewed the scans and discussed with the patient. No results found.   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 Joylene Grapes at [facility]-SPEP 0.2 g, immunofixation with biclonal IgG with lambda specificity. -Free kappa light chain 16.7, lambda light chains 122.7, ratio 0.14. -Kidney biopsy on 02/04/2020 with AL amyloidosis, lambda immunophenotype, Congo red stain positive. Immunofluorescence microscopy shows dense homogeneous glomerular mesangial staining with antisera  specific for IgG 1-2+, IgM 2+, C1q 2+, lambda light chains 3+. Waxy homogeneous staining noted along the blood vessels. Congo red stain demonstrates positive apocrine birefringence within the glomerular mesangial regions and along blood vessel walls. -48 pound weight loss since summer 2021 with decreased appetite. Positive for fatigue. -Reports occasional nausea, no vomiting. Positive for occipital headaches. -Denies any tingling or numbness in the extremities. He has cramps in the left arm which is new. He had leg cramps for a long time. -Cardiac MRI on 02/29/2020 with EF 38%, no evidence of amyloid deposits in the myocardium. -PET scan on 02/26/2020 with scattered borderline mediastinal and hilar lymph nodes with low-level hypermetabolism most likely inflammatory or reactive. No bone abnormalities. No findings in the abdomen or pelvis. -Bone marrow biopsy on 02/26/2020 with slightly hypercellular bone marrow with 23% cells consistent with plasma cells, staining for lambda light chain. No lymphoproliferative disorder. Congo red stain was negative. -Bone marrow cytogenetics were normal. FISH panel for multiple myeloma was negative. -M spike was 0.1 g, immunofixation shows IgG lambda. LDH was elevated at 216. Beta-2 microglobulin 4.5. -Dara-CyBorD started on 03/20/2020.  2. Social/family history: -He works for [employer] and retired 5 years ago. Never smoker. -Maternal aunt had cancer in male organs. One maternal first cousin had appendiceal cancer, another maternal first cousin had cancer of the digestive system.   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 - Lambda light chains in the serum have normalized.  Serum immunofixation shows IgG lambda.  M spike is 0.1 g.  24-hour urine shows improvement in total protein to 16.8 g. - We will continue Dara CyBorD for up to 6  cycles.  This will be followed by maintenance daratumumab. - He had an EGD and random biopsies scheduled on 08/04/2020 with GI at [facility] 3 weeks for follow-up with labs.  2. Nausea: -Use Compazine as needed.  3.  Leg weakness/pains: -Left sural nerve biopsy on 07/02/2020 at [facility] did not show any evidence of amyloidosis. - Which showed some demyelination consistent with neuropathy. - He has a upper leg pains on walking.  Takes tramadol for mild pain and hydrocodone for severe pain.  4. Sleeping difficulty: -Is not on any sleep medication at this time.  He is sleeping reasonably well.  5. Loss of appetite: -Continue Megace daily.  Weight has been stable.  Continue nutritional supplements.  6. Lower extremity swelling: -He still has leg swellings.  I will increase Lasix to 40 mg in the morning and 20 mg in the evening as needed.  Orders placed this encounter:  Orders Placed This Encounter  Procedures  . Protein electrophoresis, serum  . Kappa/lambda light chains  . Immunofixation electrophoresis  . CBC with Differential/Platelet  . Comprehensive metabolic panel  . 24 hr Ur UPEP/UIFE/Light Chains/TP     Derek Jack, MD [place] [phone]   I, Milinda Antis, am acting as a scribe for Dr. Sanda Linger.  I, Derek Jack MD, have reviewed the above documentation for accuracy and completeness, and I agree with the above.

## 2020-07-24 NOTE — Progress Notes (Signed)
Patient presents today for Cytoxan infusion/Velcade and Daratumumab injections.  Vital signs within parameters for treatment.  Labs pending.  Patient complains that his bilateral lower legs and feet are swelling and that he had some weeping to the left leg over this past weekend.  Patient contacted the nurse navigator for instructions for care.   Weeping has since stopped but legs remain swollen at a +3.  Patient has no other complaints st this time.  Labs reviewed and creatinine elevated, Dr. Delton Coombes notified.   Message received from [facility], LPN/Dr.Katragadda, patient okay for treatment.  Cytoxan infusion/Velcade and Daratumumab injections given today per MD orders.  Stable during infusion and injections without adverse affects.  Injection sites WNL.  Vital signs stable.  No complaints at this time.  Discharge from clinic ambulatory in stable condition.  Alert and oriented X 3.  Follow up with [facility] as scheduled.

## 2020-07-24 NOTE — Progress Notes (Signed)
Patient was assessed by Dr. Delton Coombes and labs have been reviewed.  Patient is okay to proceed with treatment today. Dr. Delton Coombes is aware of creatinine increase 1.34 along with bilateral feet and leg swelling 3+ today. Primary RN and pharmacy aware.

## 2020-07-31 ENCOUNTER — Other Ambulatory Visit: Payer: Self-pay

## 2020-07-31 ENCOUNTER — Encounter (HOSPITAL_COMMUNITY): Payer: Self-pay

## 2020-07-31 ENCOUNTER — Inpatient Hospital Stay (HOSPITAL_COMMUNITY): Payer: Medicare Other

## 2020-07-31 VITALS — BP 125/77 | HR 78 | Temp 97.3°F | Resp 18

## 2020-07-31 DIAGNOSIS — E8581 Light chain (AL) amyloidosis: Secondary | ICD-10-CM

## 2020-07-31 DIAGNOSIS — C9000 Multiple myeloma not having achieved remission: Secondary | ICD-10-CM

## 2020-07-31 DIAGNOSIS — Z5111 Encounter for antineoplastic chemotherapy: Secondary | ICD-10-CM | POA: Diagnosis not present

## 2020-07-31 LAB — COMPREHENSIVE METABOLIC PANEL
ALT: 11 U/L (ref 0–44)
AST: 13 U/L — ABNORMAL LOW (ref 15–41)
Albumin: 2 g/dL — ABNORMAL LOW (ref 3.5–5.0)
Alkaline Phosphatase: 120 U/L (ref 38–126)
Anion gap: 8 (ref 5–15)
BUN: 26 mg/dL — ABNORMAL HIGH (ref 8–23)
CO2: 28 mmol/L (ref 22–32)
Calcium: 8.2 mg/dL — ABNORMAL LOW (ref 8.9–10.3)
Chloride: 101 mmol/L (ref 98–111)
Creatinine, Ser: 1.24 mg/dL (ref 0.61–1.24)
GFR, Estimated: >60 mL/min (ref >60)
Glucose, Bld: 91 mg/dL (ref 70–99)
Potassium: 4 mmol/L (ref 3.5–5.1)
Sodium: 137 mmol/L (ref 135–145)
Total Bilirubin: 0.3 mg/dL (ref 0.3–1.2)
Total Protein: 4.8 g/dL — ABNORMAL LOW (ref 6.5–8.1)

## 2020-07-31 LAB — CBC WITH DIFFERENTIAL/PLATELET
Abs Immature Granulocytes: 0.1 10*3/uL — ABNORMAL HIGH (ref 0.00–0.07)
Basophils Absolute: 0 10*3/uL (ref 0.0–0.1)
Basophils Relative: 0 %
Eosinophils Absolute: 0.4 10*3/uL (ref 0.0–0.5)
Eosinophils Relative: 3 %
HCT: 29.4 % — ABNORMAL LOW (ref 39.0–52.0)
Hemoglobin: 9.4 g/dL — ABNORMAL LOW (ref 13.0–17.0)
Immature Granulocytes: 1 %
Lymphocytes Relative: 12 %
Lymphs Abs: 1.3 10*3/uL (ref 0.7–4.0)
MCH: 34.9 pg — ABNORMAL HIGH (ref 26.0–34.0)
MCHC: 32 g/dL (ref 30.0–36.0)
MCV: 109.3 fL — ABNORMAL HIGH (ref 80.0–100.0)
Monocytes Absolute: 0.6 10*3/uL (ref 0.1–1.0)
Monocytes Relative: 6 %
Neutro Abs: 8.2 10*3/uL — ABNORMAL HIGH (ref 1.7–7.7)
Neutrophils Relative %: 78 %
Platelets: 155 10*3/uL (ref 150–400)
RBC: 2.69 MIL/uL — ABNORMAL LOW (ref 4.22–5.81)
RDW: 16.5 % — ABNORMAL HIGH (ref 11.5–15.5)
WBC: 10.6 10*3/uL — ABNORMAL HIGH (ref 4.0–10.5)
nRBC: 0 % (ref 0.0–0.2)

## 2020-07-31 LAB — MAGNESIUM: Magnesium: 1.9 mg/dL (ref 1.7–2.4)

## 2020-07-31 MED ORDER — HEPARIN SOD (PORK) LOCK FLUSH 100 UNIT/ML IV SOLN
500.0000 [IU] | Freq: Once | INTRAVENOUS | Status: AC
  Administered 2020-07-31: 500 [IU] via INTRAVENOUS

## 2020-07-31 MED ORDER — SODIUM CHLORIDE 0.9 % IV SOLN
Freq: Once | INTRAVENOUS | Status: AC
Start: 2020-07-31 — End: 2020-07-31

## 2020-07-31 MED ORDER — SODIUM CHLORIDE 0.9 % IV SOLN
20.0000 mg | Freq: Once | INTRAVENOUS | Status: AC
  Administered 2020-07-31: 20 mg via INTRAVENOUS
  Filled 2020-07-31: qty 20

## 2020-07-31 MED ORDER — BORTEZOMIB CHEMO SQ INJECTION 3.5 MG (2.5MG/ML)
1.3000 mg/m2 | Freq: Once | INTRAMUSCULAR | Status: AC
  Administered 2020-07-31: 2.75 mg via SUBCUTANEOUS
  Filled 2020-07-31: qty 1.1

## 2020-07-31 MED ORDER — CYCLOPHOSPHAMIDE CHEMO INJECTION 1 GM
500.0000 mg | Freq: Once | INTRAMUSCULAR | Status: AC
  Administered 2020-07-31: 500 mg via INTRAVENOUS
  Filled 2020-07-31: qty 25

## 2020-07-31 MED ORDER — PALONOSETRON HCL INJECTION 0.25 MG/5ML
0.2500 mg | Freq: Once | INTRAVENOUS | Status: AC
Start: 2020-07-31 — End: 2020-07-31
  Administered 2020-07-31: 0.25 mg via INTRAVENOUS
  Filled 2020-07-31: qty 5

## 2020-07-31 MED ORDER — SODIUM CHLORIDE 0.9% FLUSH
10.0000 mL | Freq: Once | INTRAVENOUS | Status: AC
  Administered 2020-07-31: 10 mL via INTRAVENOUS

## 2020-07-31 NOTE — Patient Instructions (Signed)
[place]  Discharge Instructions: Thank you for choosing [facility] to provide your oncology and hematology care.  If you have a lab appointment with the [facility], please come in thru the Main Entrance and check in at the main information desk.  Wear comfortable clothing and clothing appropriate for easy access to any Portacath or PICC line.   We strive to give you quality time with your provider. You may need to reschedule your appointment if you arrive late (15 or more minutes).  Arriving late affects you and other patients whose appointments are after yours.  Also, if you miss three or more appointments without notifying the office, you may be dismissed from the clinic at the provider's discretion.      For prescription refill requests, have your pharmacy contact our office and allow 72 hours for refills to be completed.    Today you received the following chemotherapy and/or immunotherapy agents; Cytoxan & Velcade. Return as scheduled.      To help prevent nausea and vomiting after your treatment, we encourage you to take your nausea medication as directed.  BELOW ARE SYMPTOMS THAT SHOULD BE REPORTED IMMEDIATELY: . *FEVER GREATER THAN 100.4 F (38 C) OR HIGHER . *CHILLS OR SWEATING . *NAUSEA AND VOMITING THAT IS NOT CONTROLLED WITH YOUR NAUSEA MEDICATION . *UNUSUAL SHORTNESS OF BREATH . *UNUSUAL BRUISING OR BLEEDING . *URINARY PROBLEMS (pain or burning when urinating, or frequent urination) . *BOWEL PROBLEMS (unusual diarrhea, constipation, pain near the anus) . TENDERNESS IN MOUTH AND THROAT WITH OR WITHOUT PRESENCE OF ULCERS (sore throat, sores in mouth, or a toothache) . UNUSUAL RASH, SWELLING OR PAIN  . UNUSUAL VAGINAL DISCHARGE OR ITCHING   Items with * indicate a potential emergency and should be followed up as soon as possible or go to the Emergency Department if any problems should occur.  Please show the CHEMOTHERAPY ALERT CARD or  IMMUNOTHERAPY ALERT CARD at check-in to the Emergency Department and triage nurse.  Should you have questions after your visit or need to cancel or reschedule your appointment, please contact [facility] [phone]  and follow the prompts.  Office hours are 8:00 a.m. to 4:30 p.m. Monday - Friday. Please note that voicemails left after 4:00 p.m. may not be returned until the following business day.  We are closed weekends and major holidays. You have access to a nurse at all times for urgent questions. Please call the main number to the clinic [phone] and follow the prompts.  For any non-urgent questions, you may also contact your provider using MyChart. We now offer e-Visits for anyone 49 and older to request care online for non-urgent symptoms. For details visit [email or web address].   Also download the MyChart app! Go to the app store, search "MyChart", open the app, select [facility], and log in with your MyChart username and password.  Due to Covid, a mask is required upon entering the hospital/clinic. If you do not have a mask, one will be given to you upon arrival. For doctor visits, patients may have 1 support person aged 12 or older with them. For treatment visits, patients cannot have anyone with them due to current Covid guidelines and our immunocompromised population.

## 2020-07-31 NOTE — Progress Notes (Signed)
Patient tolerated chemotherapy; Cytoxan & Velcade with no complaints voiced. Side effects with management reviewed understanding verbalized. Port site clean and dry with no bruising or swelling noted at site. Good blood return noted before and after administration of chemotherapy. Band aid applied. Patient left in satisfactory condition with VSS and no s/s of distress noted.

## 2020-08-07 ENCOUNTER — Other Ambulatory Visit: Payer: Self-pay

## 2020-08-07 ENCOUNTER — Inpatient Hospital Stay (HOSPITAL_COMMUNITY): Payer: Medicare Other | Attending: Hematology

## 2020-08-07 ENCOUNTER — Inpatient Hospital Stay (HOSPITAL_COMMUNITY): Payer: Medicare Other

## 2020-08-07 VITALS — BP 118/72 | HR 77 | Temp 97.2°F | Resp 18

## 2020-08-07 DIAGNOSIS — R110 Nausea: Secondary | ICD-10-CM | POA: Diagnosis not present

## 2020-08-07 DIAGNOSIS — K219 Gastro-esophageal reflux disease without esophagitis: Secondary | ICD-10-CM | POA: Insufficient documentation

## 2020-08-07 DIAGNOSIS — R197 Diarrhea, unspecified: Secondary | ICD-10-CM | POA: Insufficient documentation

## 2020-08-07 DIAGNOSIS — Z5111 Encounter for antineoplastic chemotherapy: Secondary | ICD-10-CM | POA: Diagnosis not present

## 2020-08-07 DIAGNOSIS — Z8673 Personal history of transient ischemic attack (TIA), and cerebral infarction without residual deficits: Secondary | ICD-10-CM | POA: Diagnosis not present

## 2020-08-07 DIAGNOSIS — M7989 Other specified soft tissue disorders: Secondary | ICD-10-CM | POA: Insufficient documentation

## 2020-08-07 DIAGNOSIS — R531 Weakness: Secondary | ICD-10-CM | POA: Diagnosis not present

## 2020-08-07 DIAGNOSIS — I252 Old myocardial infarction: Secondary | ICD-10-CM | POA: Diagnosis not present

## 2020-08-07 DIAGNOSIS — C9000 Multiple myeloma not having achieved remission: Secondary | ICD-10-CM

## 2020-08-07 DIAGNOSIS — I10 Essential (primary) hypertension: Secondary | ICD-10-CM | POA: Diagnosis not present

## 2020-08-07 DIAGNOSIS — R7402 Elevation of levels of lactic acid dehydrogenase (LDH): Secondary | ICD-10-CM | POA: Insufficient documentation

## 2020-08-07 DIAGNOSIS — R5383 Other fatigue: Secondary | ICD-10-CM | POA: Diagnosis not present

## 2020-08-07 DIAGNOSIS — I2510 Atherosclerotic heart disease of native coronary artery without angina pectoris: Secondary | ICD-10-CM | POA: Insufficient documentation

## 2020-08-07 DIAGNOSIS — I739 Peripheral vascular disease, unspecified: Secondary | ICD-10-CM | POA: Diagnosis not present

## 2020-08-07 DIAGNOSIS — R630 Anorexia: Secondary | ICD-10-CM | POA: Insufficient documentation

## 2020-08-07 DIAGNOSIS — E8581 Light chain (AL) amyloidosis: Secondary | ICD-10-CM | POA: Insufficient documentation

## 2020-08-07 DIAGNOSIS — Z79899 Other long term (current) drug therapy: Secondary | ICD-10-CM | POA: Diagnosis not present

## 2020-08-07 LAB — CBC WITH DIFFERENTIAL/PLATELET
Abs Immature Granulocytes: 0.11 10*3/uL — ABNORMAL HIGH (ref 0.00–0.07)
Basophils Absolute: 0 10*3/uL (ref 0.0–0.1)
Basophils Relative: 0 %
Eosinophils Absolute: 0.4 10*3/uL (ref 0.0–0.5)
Eosinophils Relative: 4 %
HCT: 28.2 % — ABNORMAL LOW (ref 39.0–52.0)
Hemoglobin: 9.3 g/dL — ABNORMAL LOW (ref 13.0–17.0)
Immature Granulocytes: 1 %
Lymphocytes Relative: 10 %
Lymphs Abs: 1.1 10*3/uL (ref 0.7–4.0)
MCH: 35.8 pg — ABNORMAL HIGH (ref 26.0–34.0)
MCHC: 33 g/dL (ref 30.0–36.0)
MCV: 108.5 fL — ABNORMAL HIGH (ref 80.0–100.0)
Monocytes Absolute: 0.7 10*3/uL (ref 0.1–1.0)
Monocytes Relative: 6 %
Neutro Abs: 8.8 10*3/uL — ABNORMAL HIGH (ref 1.7–7.7)
Neutrophils Relative %: 79 %
Platelets: 153 10*3/uL (ref 150–400)
RBC: 2.6 MIL/uL — ABNORMAL LOW (ref 4.22–5.81)
RDW: 16.6 % — ABNORMAL HIGH (ref 11.5–15.5)
WBC: 11.1 10*3/uL — ABNORMAL HIGH (ref 4.0–10.5)
nRBC: 0 % (ref 0.0–0.2)

## 2020-08-07 LAB — COMPREHENSIVE METABOLIC PANEL
ALT: 10 U/L (ref 0–44)
AST: 12 U/L — ABNORMAL LOW (ref 15–41)
Albumin: 2 g/dL — ABNORMAL LOW (ref 3.5–5.0)
Alkaline Phosphatase: 112 U/L (ref 38–126)
Anion gap: 6 (ref 5–15)
BUN: 24 mg/dL — ABNORMAL HIGH (ref 8–23)
CO2: 29 mmol/L (ref 22–32)
Calcium: 7.9 mg/dL — ABNORMAL LOW (ref 8.9–10.3)
Chloride: 103 mmol/L (ref 98–111)
Creatinine, Ser: 1.32 mg/dL — ABNORMAL HIGH (ref 0.61–1.24)
GFR, Estimated: 58 mL/min — ABNORMAL LOW (ref >60)
Glucose, Bld: 95 mg/dL (ref 70–99)
Potassium: 3.9 mmol/L (ref 3.5–5.1)
Sodium: 138 mmol/L (ref 135–145)
Total Bilirubin: 0.5 mg/dL (ref 0.3–1.2)
Total Protein: 4.8 g/dL — ABNORMAL LOW (ref 6.5–8.1)

## 2020-08-07 LAB — MAGNESIUM: Magnesium: 1.8 mg/dL (ref 1.7–2.4)

## 2020-08-07 MED ORDER — SODIUM CHLORIDE 0.9 % IV SOLN: Freq: Once | INTRAVENOUS | Status: AC

## 2020-08-07 MED ORDER — SODIUM CHLORIDE 0.9 % IV SOLN
20.0000 mg | Freq: Once | INTRAVENOUS | Status: AC
  Administered 2020-08-07: 20 mg via INTRAVENOUS
  Filled 2020-08-07: qty 20

## 2020-08-07 MED ORDER — DARATUMUMAB-HYALURONIDASE-FIHJ 1800-30000 MG-UT/15ML ~~LOC~~ SOLN
1800.0000 mg | Freq: Once | SUBCUTANEOUS | Status: AC
  Administered 2020-08-07: 1800 mg via SUBCUTANEOUS
  Filled 2020-08-07: qty 15

## 2020-08-07 MED ORDER — ACETAMINOPHEN 325 MG PO TABS
650.0000 mg | ORAL_TABLET | Freq: Once | ORAL | Status: AC
  Administered 2020-08-07: 650 mg via ORAL
  Filled 2020-08-07: qty 2

## 2020-08-07 MED ORDER — BORTEZOMIB CHEMO SQ INJECTION 3.5 MG (2.5MG/ML)
1.3000 mg/m2 | Freq: Once | INTRAMUSCULAR | Status: AC
  Administered 2020-08-07: 2.75 mg via SUBCUTANEOUS
  Filled 2020-08-07: qty 1.1

## 2020-08-07 MED ORDER — PALONOSETRON HCL INJECTION 0.25 MG/5ML
0.2500 mg | Freq: Once | INTRAVENOUS | Status: AC
  Administered 2020-08-07: 0.25 mg via INTRAVENOUS
  Filled 2020-08-07: qty 5

## 2020-08-07 MED ORDER — GABAPENTIN 300 MG PO CAPS: 300.0000 mg | ORAL_CAPSULE | Freq: Every day | ORAL | 6 refills | Status: AC

## 2020-08-07 MED ORDER — SODIUM CHLORIDE 0.9 % IV SOLN
500.0000 mg | Freq: Once | INTRAVENOUS | Status: AC
  Administered 2020-08-07: 500 mg via INTRAVENOUS
  Filled 2020-08-07: qty 25

## 2020-08-07 MED ORDER — DIPHENHYDRAMINE HCL 25 MG PO CAPS
50.0000 mg | ORAL_CAPSULE | Freq: Once | ORAL | Status: AC
  Administered 2020-08-07: 50 mg via ORAL
  Filled 2020-08-07: qty 2

## 2020-08-07 NOTE — Patient Instructions (Signed)
[facility]  Discharge Instructions: Thank you for choosing [facility] to provide your oncology and hematology care.  If you have a lab appointment with the Cancer Center, please come in thru the Main Entrance and check in at the main information desk.  Wear comfortable clothing and clothing appropriate for easy access to any Portacath or PICC line.   We strive to give you quality time with your provider. You may need to reschedule your appointment if you arrive late (15 or more minutes).  Arriving late affects you and other patients whose appointments are after yours.  Also, if you miss three or more appointments without notifying the office, you may be dismissed from the clinic at the provider's discretion.      For prescription refill requests, have your pharmacy contact our office and allow 72 hours for refills to be completed.        To help prevent nausea and vomiting after your treatment, we encourage you to take your nausea medication as directed.  BELOW ARE SYMPTOMS THAT SHOULD BE REPORTED IMMEDIATELY: *FEVER GREATER THAN 100.4 F (38 C) OR HIGHER *CHILLS OR SWEATING *NAUSEA AND VOMITING THAT IS NOT CONTROLLED WITH YOUR NAUSEA MEDICATION *UNUSUAL SHORTNESS OF BREATH *UNUSUAL BRUISING OR BLEEDING *URINARY PROBLEMS (pain or burning when urinating, or frequent urination) *BOWEL PROBLEMS (unusual diarrhea, constipation, pain near the anus) TENDERNESS IN MOUTH AND THROAT WITH OR WITHOUT PRESENCE OF ULCERS (sore throat, sores in mouth, or a toothache) UNUSUAL RASH, SWELLING OR PAIN  UNUSUAL VAGINAL DISCHARGE OR ITCHING   Items with * indicate a potential emergency and should be followed up as soon as possible or go to the Emergency Department if any problems should occur.  Please show the CHEMOTHERAPY ALERT CARD or IMMUNOTHERAPY ALERT CARD at check-in to the Emergency Department and triage nurse.  Should you have questions after your visit or need to cancel  or reschedule your appointment, please contact [facility] [phone]  and follow the prompts.  Office hours are 8:00 a.m. to 4:30 p.m. Monday - Friday. Please note that voicemails left after 4:00 p.m. may not be returned until the following business day.  We are closed weekends and major holidays. You have access to a nurse at all times for urgent questions. Please call the main number to the clinic [phone] and follow the prompts.  For any non-urgent questions, you may also contact your provider using MyChart. We now offer e-Visits for anyone 18 and older to request care online for non-urgent symptoms. For details visit [email or web address].   Also download the MyChart app! Go to the app store, search "MyChart", open the app, select [facility], and log in with your MyChart username and password.  Due to Covid, a mask is required upon entering the hospital/clinic. If you do not have a mask, one will be given to you upon arrival. For doctor visits, patients may have 1 support person aged 18 or older with them. For treatment visits, patients cannot have anyone with them due to current Covid guidelines and our immunocompromised population.  

## 2020-08-07 NOTE — Progress Notes (Signed)
Labs reviewed today with MD. Will proceed as planned per MD.    Treatment given per orders. Patient tolerated it well without problems. Vitals stable and discharged home from clinic via wheelchair. Follow up as scheduled.     

## 2020-08-14 ENCOUNTER — Inpatient Hospital Stay (HOSPITAL_BASED_OUTPATIENT_CLINIC_OR_DEPARTMENT_OTHER): Payer: Medicare Other | Admitting: Hematology

## 2020-08-14 ENCOUNTER — Other Ambulatory Visit: Payer: Self-pay

## 2020-08-14 ENCOUNTER — Inpatient Hospital Stay (HOSPITAL_COMMUNITY): Payer: Medicare Other

## 2020-08-14 ENCOUNTER — Telehealth (HOSPITAL_COMMUNITY): Payer: Self-pay | Admitting: Dietician

## 2020-08-14 ENCOUNTER — Encounter (HOSPITAL_COMMUNITY): Payer: Medicare Other | Admitting: Dietician

## 2020-08-14 VITALS — BP 112/76 | HR 84 | Temp 97.5°F | Resp 18 | Wt 203.1 lb

## 2020-08-14 DIAGNOSIS — C9000 Multiple myeloma not having achieved remission: Secondary | ICD-10-CM

## 2020-08-14 DIAGNOSIS — E8581 Light chain (AL) amyloidosis: Secondary | ICD-10-CM

## 2020-08-14 DIAGNOSIS — Z5111 Encounter for antineoplastic chemotherapy: Secondary | ICD-10-CM | POA: Diagnosis not present

## 2020-08-14 LAB — CBC WITH DIFFERENTIAL/PLATELET
Abs Immature Granulocytes: 0.11 10*3/uL — ABNORMAL HIGH (ref 0.00–0.07)
Basophils Absolute: 0 10*3/uL (ref 0.0–0.1)
Basophils Relative: 0 %
Eosinophils Absolute: 0.3 10*3/uL (ref 0.0–0.5)
Eosinophils Relative: 3 %
HCT: 27.7 % — ABNORMAL LOW (ref 39.0–52.0)
Hemoglobin: 9.1 g/dL — ABNORMAL LOW (ref 13.0–17.0)
Immature Granulocytes: 1 %
Lymphocytes Relative: 12 %
Lymphs Abs: 1.2 10*3/uL (ref 0.7–4.0)
MCH: 35.1 pg — ABNORMAL HIGH (ref 26.0–34.0)
MCHC: 32.9 g/dL (ref 30.0–36.0)
MCV: 106.9 fL — ABNORMAL HIGH (ref 80.0–100.0)
Monocytes Absolute: 0.6 10*3/uL (ref 0.1–1.0)
Monocytes Relative: 6 %
Neutro Abs: 7.9 10*3/uL — ABNORMAL HIGH (ref 1.7–7.7)
Neutrophils Relative %: 78 %
Platelets: 159 10*3/uL (ref 150–400)
RBC: 2.59 MIL/uL — ABNORMAL LOW (ref 4.22–5.81)
RDW: 16.2 % — ABNORMAL HIGH (ref 11.5–15.5)
WBC: 10.2 10*3/uL (ref 4.0–10.5)
nRBC: 0 % (ref 0.0–0.2)

## 2020-08-14 LAB — COMPREHENSIVE METABOLIC PANEL
ALT: 10 U/L (ref 0–44)
AST: 10 U/L — ABNORMAL LOW (ref 15–41)
Albumin: 1.9 g/dL — ABNORMAL LOW (ref 3.5–5.0)
Alkaline Phosphatase: 108 U/L (ref 38–126)
Anion gap: 6 (ref 5–15)
BUN: 23 mg/dL (ref 8–23)
CO2: 29 mmol/L (ref 22–32)
Calcium: 7.8 mg/dL — ABNORMAL LOW (ref 8.9–10.3)
Chloride: 99 mmol/L (ref 98–111)
Creatinine, Ser: 1.33 mg/dL — ABNORMAL HIGH (ref 0.61–1.24)
GFR, Estimated: 58 mL/min — ABNORMAL LOW (ref >60)
Glucose, Bld: 94 mg/dL (ref 70–99)
Potassium: 3.9 mmol/L (ref 3.5–5.1)
Sodium: 134 mmol/L — ABNORMAL LOW (ref 135–145)
Total Bilirubin: 0.6 mg/dL (ref 0.3–1.2)
Total Protein: 4.8 g/dL — ABNORMAL LOW (ref 6.5–8.1)

## 2020-08-14 MED ORDER — HEPARIN SOD (PORK) LOCK FLUSH 100 UNIT/ML IV SOLN
500.0000 [IU] | Freq: Once | INTRAVENOUS | Status: AC
  Administered 2020-08-14: 500 [IU] via INTRAVENOUS

## 2020-08-14 MED ORDER — SODIUM CHLORIDE 0.9% FLUSH
10.0000 mL | Freq: Once | INTRAVENOUS | Status: AC
  Administered 2020-08-14: 10 mL via INTRAVENOUS

## 2020-08-14 MED ORDER — BUMETANIDE 1 MG PO TABS: 1.0000 mg | ORAL_TABLET | Freq: Every day | ORAL | 3 refills | Status: DC

## 2020-08-14 NOTE — Progress Notes (Signed)
Patient assessed and labs reviewed by Dr Delton Coombes.  No treatment today.  Medications chanced and verbalized understanding.

## 2020-08-14 NOTE — Patient Instructions (Addendum)
[place] at [facility] Discharge Instructions  You were seen today by Dr. Delton Coombes. He went over your recent results. You did not receive treatment today. Keep your appointment with your cardiologist. Stop taking Lasix;begin taking 1 mg Bumex tablet in the morning and 1/2 tablet in the evening as long as your systolic blood pressure is above 100. Dr. Delton Coombes will see you back in 1 week for labs and follow up.   Thank you for choosing [facility] at [facility] to provide your oncology and hematology care.  To afford each patient quality time with our provider, please arrive at least 15 minutes before your scheduled appointment time.   If you have a lab appointment with the [facility] please come in thru the Main Entrance and check in at the main information desk  You need to re-schedule your appointment should you arrive 10 or more minutes late.  We strive to give you quality time with our providers, and arriving late affects you and other patients whose appointments are after yours.  Also, if you no show three or more times for appointments you may be dismissed from the clinic at the providers discretion.     Again, thank you for choosing [facility].  Our hope is that these requests will decrease the amount of time that you wait before being seen by our physicians.       _____________________________________________________________  Should you have questions after your visit to [facility], please contact our office at [phone] between the hours of 8:00 a.m. and 4:30 p.m.  Voicemails left after 4:00 p.m. will not be returned until the following business day.  For prescription refill requests, have your pharmacy contact our office and allow 72 hours.    Cancer Center Support Programs:   > Cancer Support Group  2nd Tuesday of the month 1pm-2pm, Journey Room

## 2020-08-14 NOTE — Patient Instructions (Signed)
[place]  Discharge Instructions: Thank you for choosing [facility] to provide your oncology and hematology care.  If you have a lab appointment with the [facility], please come in thru the Main Entrance and check in at the main information desk.  Wear comfortable clothing and clothing appropriate for easy access to any Portacath or PICC line.   We strive to give you quality time with your provider. You may need to reschedule your appointment if you arrive late (15 or more minutes).  Arriving late affects you and other patients whose appointments are after yours.  Also, if you miss three or more appointments without notifying the office, you may be dismissed from the clinic at the provider's discretion.      For prescription refill requests, have your pharmacy contact our office and allow 72 hours for refills to be completed.    Today your labs were drawn, no treatment today per Dr. Delton Coombes, return as scheduled.   To help prevent nausea and vomiting after your treatment, we encourage you to take your nausea medication as directed.  BELOW ARE SYMPTOMS THAT SHOULD BE REPORTED IMMEDIATELY: . *FEVER GREATER THAN 100.4 F (38 C) OR HIGHER . *CHILLS OR SWEATING . *NAUSEA AND VOMITING THAT IS NOT CONTROLLED WITH YOUR NAUSEA MEDICATION . *UNUSUAL SHORTNESS OF BREATH . *UNUSUAL BRUISING OR BLEEDING . *URINARY PROBLEMS (pain or burning when urinating, or frequent urination) . *BOWEL PROBLEMS (unusual diarrhea, constipation, pain near the anus) . TENDERNESS IN MOUTH AND THROAT WITH OR WITHOUT PRESENCE OF ULCERS (sore throat, sores in mouth, or a toothache) . UNUSUAL RASH, SWELLING OR PAIN  . UNUSUAL VAGINAL DISCHARGE OR ITCHING   Items with * indicate a potential emergency and should be followed up as soon as possible or go to the Emergency Department if any problems should occur.  Please show the CHEMOTHERAPY ALERT CARD or IMMUNOTHERAPY ALERT CARD at check-in to  the Emergency Department and triage nurse.  Should you have questions after your visit or need to cancel or reschedule your appointment, please contact [facility] [phone]  and follow the prompts.  Office hours are 8:00 a.m. to 4:30 p.m. Monday - Friday. Please note that voicemails left after 4:00 p.m. may not be returned until the following business day.  We are closed weekends and major holidays. You have access to a nurse at all times for urgent questions. Please call the main number to the clinic [phone] and follow the prompts.  For any non-urgent questions, you may also contact your provider using MyChart. We now offer e-Visits for anyone 77 and older to request care online for non-urgent symptoms. For details visit [email or web address].   Also download the MyChart app! Go to the app store, search "MyChart", open the app, select [facility], and log in with your MyChart username and password.  Due to Covid, a mask is required upon entering the hospital/clinic. If you do not have a mask, one will be given to you upon arrival. For doctor visits, patients may have 1 support person aged 78 or older with them. For treatment visits, patients cannot have anyone with them due to current Covid guidelines and our immunocompromised population.

## 2020-08-14 NOTE — Progress Notes (Signed)
No treatment today per Dr. Katragadda.  Port flushed with good blood return noted. No bruising or swelling at site. Bandaid applied and patient discharged in satisfactory condition. VVS stable with no signs or symptoms of distressed noted.  °

## 2020-08-14 NOTE — Telephone Encounter (Signed)
Nutrition  Attempted to contact patient via telephone for nutrition follow-up. Patient did not answer. Left voicemail with request for return call. Contact information provided  Lajuan Lines, RD, [place] [phone]

## 2020-08-14 NOTE — Progress Notes (Signed)
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  PRIOR THERAPY: none  NGS Results: not done  CURRENT THERAPY: DaraCyBorD & Aloxi weekly  BRIEF ONCOLOGIC HISTORY:  Oncology History  Multiple myeloma ([place])  03/06/2020 Initial Diagnosis   Multiple myeloma ([place])   03/06/2020 Cancer Staging   Staging form: Plasma Cell Myeloma and Plasma Cell Disorders, AJCC 8th Edition - Clinical stage from 03/06/2020: RISS Stage II (Beta-2-microglobulin (mg/L): 4.5, Albumin (g/dL): 2.5, ISS: Stage II, High-risk cytogenetics: Absent, LDH: Elevated) - Signed by Derek Jack, MD on 03/06/2020   03/20/2020 - 03/20/2020 Chemotherapy   The patient had dexamethasone (DECADRON) tablet 40 mg, 40 mg, Oral,  Once, 0 of 4 cycles bortezomib SQ (VELCADE) chemo injection (2.5mg /mL concentration) 2.75 mg, 1.3 mg/m2 = 2.75 mg, Subcutaneous,  Once, 0 of 4 cycles  for chemotherapy treatment.    03/20/2020 -  Chemotherapy    Patient is on Treatment Plan: PRIMARY AMYLOIDOSIS DARACYBORD (DARATUMUMAB SQ + CYCLOPHOSPHAMIDE PO + BORTEZOMIB SQ + DEXAMETHASONE PO/IV) Q28D X 6 CYCLES / DARATUMUMAB SQ Q28D        CANCER STAGING: Cancer Staging Multiple myeloma ([place]) Staging form: Plasma Cell Myeloma and Plasma Cell Disorders, AJCC 8th Edition - Clinical stage from 03/06/2020: RISS Stage II (Beta-2-microglobulin (mg/L): 4.5, Albumin (g/dL): 2.5, ISS: Stage II, High-risk cytogenetics: Absent, LDH: Elevated) - Signed by Derek Jack, MD on 03/06/2020   INTERVAL HISTORY:  Julian Miranda, a 70 y.o. male, returns for routine follow-up and consideration for next cycle of chemotherapy. Julian Miranda was last seen on 07/24/2020.  Due for day #8 cycle #6 of DaraCyBorD today.   Overall, he tells me he has been feeling pretty  well. Today he is accompanied by his with. He reports 3-4 episodes of diarrhea that went away without treatment. He complains of worsening nausea for the past week occurring mostly in the morning that is helped by anti-nausea medication. He is currently taking Lasix; two tablets in the morning and once at night. His wife reports the swelling in his lower legs has moved upwards. His appetite is increased. He reports consistent, stable leg pain during activity. He is taking tramadol and norco PRN. He reports heaviness in his chest. He has gained weight; his wife attributes this to the swelling as he shows no other form of weight gain. He denies frequent urination, but his wife reports he is consuming plenty of fluid. Lasix has not helped. He complains of SOB and orthopnea which subsides after 5-6 minutes. He reports abdominal distention.   REVIEW OF SYSTEMS:  Review of Systems  Constitutional: Positive for fatigue (405). Negative for appetite change.  Respiratory: Positive for chest tightness and shortness of breath.   Cardiovascular: Positive for leg swelling.  Gastrointestinal: Positive for abdominal distention, diarrhea (after chemo) and nausea (in a.m.).  Neurological: Positive for dizziness and headaches.  Psychiatric/Behavioral: Positive for sleep disturbance (d/t urination).    PAST MEDICAL/SURGICAL HISTORY:  Past Medical History:  Diagnosis Date  . CAD (coronary artery disease)   . GERD (gastroesophageal reflux disease)   . Hypertension   . Myocardial infarction ([place]) 1994  . Peripheral vascular disease ([place])   . Stroke [facility])    Past Surgical History:  Procedure Laterality Date  . BYPASS GRAFT POPLITEAL TO POPLITEAL Right 03/23/2018   Procedure: BYPASS  GRAFT RIGHT ABOVE KNEE POPLITEAL TO BELOW KNEE POPLITEAL ARTERY  USING RIGHT GREAT SAPHENOUS VEIN;  Surgeon: Marty Heck, MD;  Location: [place];  Service: Vascular;  Laterality: Right;  . BYPASS GRAFT POPLITEAL TO POPLITEAL Left  04/09/2019   Procedure: BYPASS  ABOVE KNEE POPLITEAL TO BELOW KNEE POPLITEAL AND LIGATION OF LEFT POPITEAL ANEURYSM;  Surgeon: Marty Heck, MD;  Location: [place];  Service: Vascular;  Laterality: Left;  . COLONOSCOPY WITH ESOPHAGOGASTRODUODENOSCOPY (EGD)    . CORONARY ARTERY BYPASS GRAFT    . EYE SURGERY Bilateral    cataracts  . FEMORAL BYPASS Right 03/23/2018  . HERNIA REPAIR    . IR IMAGING GUIDED PORT INSERTION  03/25/2020  . MASTOIDECTOMY    . ROTATOR CUFF REPAIR Right   . TONSILLECTOMY    . VEIN HARVEST Right 03/23/2018   Procedure: VEIN HARVEST RIGHT GREAT SAPHENOUS;  Surgeon: Marty Heck, MD;  Location: [place];  Service: Vascular;  Laterality: Right;  . VEIN HARVEST Left 04/09/2019   Procedure: Harvest Greater Saphenous Vein and  Revise Elisa Lateral Saphenous Vein ;  Surgeon: Marty Heck, MD;  Location: [place];  Service: Vascular;  Laterality: Left;    SOCIAL HISTORY:  Social History   Socioeconomic History  . Marital status: Married    Spouse name: Not on file  . Number of children: Not on file  . Years of education: Not on file  . Highest education level: Not on file  Occupational History  . Not on file  Tobacco Use  . Smoking status: Never Smoker  . Smokeless tobacco: Never Used  Vaping Use  . Vaping Use: Never used  Substance and Sexual Activity  . Alcohol use: Never  . Drug use: Never  . Sexual activity: Not on file  Other Topics Concern  . Not on file  Social History Narrative  . Not on file   Social Determinants of Health   Financial Resource Strain: Low Risk   . Difficulty of Paying Living Expenses: Not hard at all  Food Insecurity: No Food Insecurity  . Worried About Charity fundraiser in the Last Year: Never true  . Ran Out of Food in the Last Year: Never true  Transportation Needs: No Transportation Needs  . Lack of Transportation (Medical): No  . Lack of Transportation (Non-Medical): No  Physical Activity: Insufficiently  Active  . Days of Exercise per Week: 2 days  . Minutes of Exercise per Session: 20 min  Stress: Stress Concern Present  . Feeling of Stress : To some extent  Social Connections: Moderately Integrated  . Frequency of Communication with Friends and Family: More than three times a week  . Frequency of Social Gatherings with Friends and Family: Twice a week  . Attends Religious Services: More than 4 times per year  . Active Member of Clubs or Organizations: No  . Attends Archivist Meetings: Never  . Marital Status: Married  Human resources officer Violence: Not At Risk  . Fear of Current or Ex-Partner: No  . Emotionally Abused: No  . Physically Abused: No  . Sexually Abused: No    FAMILY HISTORY:  Family History  Problem Relation Age of Onset  . Heart disease Mother     CURRENT MEDICATIONS:  Current Outpatient Medications  Medication Sig Dispense Refill  . acetaminophen (TYLENOL) 500 MG tablet Take 500 mg by mouth 2 (two) times daily.    Marland Kitchen acyclovir (ZOVIRAX) 400 MG tablet Take 1 tablet (  400 mg total) by mouth 2 (two) times daily. 60 tablet 5  . ALPRAZolam (XANAX) 0.25 MG tablet Take 0.25 mg by mouth in the morning, at noon, in the evening, and at bedtime.     Marland Kitchen amLODipine (NORVASC) 10 MG tablet Take 10 mg by mouth daily.    Marland Kitchen aspirin 81 MG tablet Take 81 mg by mouth daily.    Marland Kitchen atorvastatin (LIPITOR) 80 MG tablet Take 80 mg by mouth at bedtime.     . carvedilol (COREG) 25 MG tablet Take 25 mg by mouth daily.     . chlorproMAZINE (THORAZINE) 25 MG tablet Take 1 tablet (25 mg total) by mouth 4 (four) times daily as needed. For hiccups 60 tablet 2  . diazepam (VALIUM) 2 MG tablet Take 2 mg by mouth daily as needed (dizziness).    . ergocalciferol (VITAMIN D2) 1.25 MG (50000 UT) capsule Take 1 capsule (50,000 Units total) by mouth once a week. 8 capsule 3  . furosemide (LASIX) 20 MG tablet Take 1 tablet (20 mg total) by mouth 2 (two) times daily. 30 tablet 2  . gabapentin  (NEURONTIN) 300 MG capsule Take 1 capsule (300 mg total) by mouth daily. 30 capsule 6  . HYDROcodone-acetaminophen (NORCO) 5-325 MG tablet Take 1 tablet by mouth every 8 (eight) hours as needed for moderate pain. 60 tablet 0  . icosapent Ethyl (VASCEPA) 1 g capsule Take 1 g by mouth 2 (two) times daily.    . Iron-Vitamin C (VITRON-C) 65-125 MG TABS Take 1 tablet by mouth daily. 60 tablet 3  . lidocaine (XYLOCAINE) 2 % solution Use as directed 15 mLs in the mouth or throat as needed for mouth pain. 15 mL 3  . lidocaine-prilocaine (EMLA) cream Apply 1 application topically as needed. Apply to portacath as needed 30 g 0  . lisinopril (ZESTRIL) 20 MG tablet Take 20 mg by mouth in the morning and at bedtime.     . magnesium oxide (MAG-OX) 400 (241.3 Mg) MG tablet Take 1 tablet (400 mg total) by mouth daily. 30 tablet 0  . magnesium oxide (MAG-OX) 400 MG tablet Take by mouth.    . meclizine (ANTIVERT) 25 MG tablet Take 1 tablet (25 mg total) by mouth 2 (two) times daily as needed for dizziness. 30 tablet 3  . megestrol (MEGACE) 400 MG/10ML suspension Take 10 mLs (400 mg total) by mouth 2 (two) times daily. 480 mL 2  . Multiple Minerals (CALCIUM/MAGNESIUM/ZINC PO) Take 1 tablet by mouth at bedtime.    Marland Kitchen omeprazole (PRILOSEC) 20 MG capsule Take 20 mg by mouth 2 (two) times daily.     . ondansetron (ZOFRAN) 4 MG tablet Take 4 mg by mouth every 8 (eight) hours as needed for nausea.    . potassium chloride (KLOR-CON) 10 MEQ tablet Take 1 tablet (10 mEq total) by mouth 2 (two) times daily. 90 tablet 3  . prochlorperazine (COMPAZINE) 10 MG tablet Take 1 tablet (10 mg total) by mouth every 6 (six) hours as needed for nausea or vomiting. 60 tablet 2  . scopolamine (TRANSDERM-SCOP) 1 MG/3DAYS Transderm-Scop 1 mg over 3 days transdermal patch  Apply 1 patch by transdermal route.    . sildenafil (REVATIO) 20 MG tablet Take 20 mg by mouth daily.    . traMADol (ULTRAM) 50 MG tablet TAKE ONE TABLET BY MOUTH EVERY 8  HOURS AS NEEDED FOR SEVERE PAIN 60 tablet 0  . vitamin B-12 (CYANOCOBALAMIN) 1000 MCG tablet Take 1,000 mcg by mouth  daily.    . zinc gluconate 50 MG tablet Take 50 mg by mouth daily.     Marland Kitchen zolpidem (AMBIEN) 10 MG tablet Take 1 tablet (10 mg total) by mouth at bedtime as needed for sleep. 20 tablet 0   No current facility-administered medications for this visit.    ALLERGIES:  Allergies  Allergen Reactions  . 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 No associated hemodynamic or respiratory changes. Please give slowly.    PHYSICAL EXAM:  Performance status (ECOG): 1 - Symptomatic but completely ambulatory  Vitals:   08/14/20 0856  BP: 112/76  Pulse: 84  Resp: 18  Temp: (!) 97.5 F (36.4 C)  SpO2: 90%   Wt Readings from Last 3 Encounters:  08/14/20 203 lb 1.6 oz (92.1 kg)  07/31/20 197 lb 1.6 oz (89.4 kg)  07/24/20 194 lb 9.6 oz (88.3 kg)   Physical Exam Vitals reviewed.  Constitutional:      Appearance: Normal appearance.  Cardiovascular:     Rate and Rhythm: Normal rate and regular rhythm.     Pulses: Normal pulses.     Heart sounds: Normal heart sounds.  Pulmonary:     Effort: Pulmonary effort is normal.     Breath sounds: Normal breath sounds.  Chest:     Comments: Port-a-cath in R chest Abdominal:     General: There is distension (mild).     Palpations: Abdomen is soft.     Tenderness: There is no abdominal tenderness.  Musculoskeletal:     Right lower leg: Edema (2+ up to knees) present.     Left lower leg: Edema (2+ up to knees) present.  Neurological:     General: No focal deficit present.     Mental Status: He is alert and oriented to person, place, and time.  Psychiatric:        Mood and Affect: Mood normal.        Behavior: Behavior normal.     LABORATORY DATA:  I have reviewed the labs as listed.  CBC Latest Ref Rng & Units 08/07/2020 07/31/2020 07/24/2020  WBC 4.0 - 10.5 K/uL 11.1(H) 10.6(H) 11.9(H)   Hemoglobin 13.0 - 17.0 g/dL 9.3(L) 9.4(L) 9.4(L)  Hematocrit 39.0 - 52.0 % 28.2(L) 29.4(L) 28.6(L)  Platelets 150 - 400 K/uL 153 155 133(L)   CMP Latest Ref Rng & Units 08/07/2020 07/31/2020 07/24/2020  Glucose 70 - 99 mg/dL 95 91 87  BUN 8 - 23 mg/dL 24(H) 26(H) 22  Creatinine 0.61 - 1.24 mg/dL 1.32(H) 1.24 1.34(H)  Sodium 135 - 145 mmol/L 138 137 137  Potassium 3.5 - 5.1 mmol/L 3.9 4.0 4.1  Chloride 98 - 111 mmol/L 103 101 103  CO2 22 - 32 mmol/L $RemoveB'29 28 27  'qaaJvDpk$ Calcium 8.9 - 10.3 mg/dL 7.9(L) 8.2(L) 8.0(L)  Total Protein 6.5 - 8.1 g/dL 4.8(L) 4.8(L) 4.6(L)  Total Bilirubin 0.3 - 1.2 mg/dL 0.5 0.3 0.5  Alkaline Phos 38 - 126 U/L 112 120 90  AST 15 - 41 U/L 12(L) 13(L) 10(L)  ALT 0 - 44 U/L $Remo'10 11 10    'iocuv$ DIAGNOSTIC IMAGING:  I have independently reviewed the scans and discussed with the patient. No results found.   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 Joylene Grapes at [facility] 0.2 g, immunofixation with biclonal IgG with lambda specificity. -Free kappa light chain 16.7, lambda light chains 122.7, ratio 0.14. -  Kidney biopsy on 02/04/2020 with AL amyloidosis, lambda immunophenotype, [place] Julian stain positive. Immunofluorescence microscopy shows dense homogeneous glomerular mesangial staining with antisera specific for IgG 1-2+, IgM 2+, C1q 2+, lambda light chains 3+. Waxy homogeneous staining noted along the blood vessels. [place] Julian stain demonstrates positive apocrine birefringence within the glomerular mesangial regions and along blood vessel walls. -48 pound weight loss since summer 2021 with decreased appetite. Positive for fatigue. -Reports occasional nausea, no vomiting. Positive for occipital headaches. -Denies any tingling or numbness in the extremities. He has cramps in the left arm which is new. He had leg cramps for a long time. -Cardiac MRI on  02/29/2020 with EF 38%, no evidence of amyloid deposits in the myocardium. -PET scan on 02/26/2020 with scattered borderline mediastinal and hilar lymph nodes with low-level hypermetabolism most likely inflammatory or reactive. No bone abnormalities. No findings in the abdomen or pelvis. -Bone marrow biopsy on 02/26/2020 with slightly hypercellular bone marrow with 23% cells consistent with plasma cells, staining for lambda light chain. No lymphoproliferative disorder. [place] Julian stain was negative. -Bone marrow cytogenetics were normal. FISH panel for multiple myeloma was negative. -M spike was 0.1 g, immunofixation shows IgG lambda. LDH was elevated at 216. Beta-2 microglobulin 4.5. -Dara-CyBorD started on 03/20/2020.  2. Social/family history: -He works for [employer] and retired 5 years ago. Never smoker. -Maternal aunt had cancer in male organs. One maternal first cousin had appendiceal cancer, another maternal first cousin had cancer of the digestive system.   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 - He has gained 10+ pounds in the last 3 weeks. - He reported 3-4 episodes of diarrhea after last 2 weeks of treatment.  This was self-limited.  Denies any fevers or infections.  No neuropathy symptoms. - I reviewed his labs which showed normal white count and platelets.  LFTs were normal.  Albumin is low at 1.9.  Creatinine is 1.33. - Because of fluid accumulation, I would hold off on treatment today.  I will reevaluate him in 1 week.  2. Nausea: -Continue Compazine as needed.  3.Leg weakness/pains: -Left sural nerve biopsy on 07/02/2020 at [facility] did not show any evidence of amyloidosis. - This showed some demyelination consistent with neuropathy. - Continue tramadol and hydrocodone as needed for  moderate to severe pain.  4. Sleeping difficulty: -He is not on any sleep medication at this time.  He is sleeping reasonably well.  5. Loss of appetite: -He is not requiring Megace.  He is eating well.  6. Lower extremity swelling: -He is continuing to gain weight. - He is taking Lasix 40 mg in the morning and 20 mg in the evening. - We will discontinue Lasix and start him on Bumex 1 mg in the morning and half milligram in the evening to be titrated up. - I have also reached out to Dr.Zakhary in [place] and updated him of the fluid overload.  He will be seen next week and an echocardiogram will be repeated.   Orders placed this encounter:  No orders of the defined types were placed in this encounter.    Derek Jack, MD [place] [phone]   I, Thana Ates, am acting as a scribe for Dr. Sanda Linger.  I, Derek Jack MD, have reviewed the above documentation for accuracy and completeness, and I agree  with the above.     

## 2020-08-15 LAB — KAPPA/LAMBDA LIGHT CHAINS
Kappa free light chain: 9.2 mg/L (ref 3.3–19.4)
Kappa, lambda light chain ratio: 0.39 (ref 0.26–1.65)
Lambda free light chains: 23.7 mg/L (ref 5.7–26.3)

## 2020-08-18 LAB — PROTEIN ELECTROPHORESIS, SERUM
A/G Ratio: 0.9 (ref 0.7–1.7)
Albumin ELP: 2 g/dL — ABNORMAL LOW (ref 2.9–4.4)
Alpha-1-Globulin: 0.3 g/dL (ref 0.0–0.4)
Alpha-2-Globulin: 1.3 g/dL — ABNORMAL HIGH (ref 0.4–1.0)
Beta Globulin: 0.5 g/dL — ABNORMAL LOW (ref 0.7–1.3)
Gamma Globulin: 0.2 g/dL — ABNORMAL LOW (ref 0.4–1.8)
Globulin, Total: 2.3 g/dL (ref 2.2–3.9)
M-Spike, %: 0.1 g/dL — ABNORMAL HIGH
Total Protein ELP: 4.3 g/dL — ABNORMAL LOW (ref 6.0–8.5)

## 2020-08-18 LAB — UPEP/UIFE/LIGHT CHAINS/TP, 24-HR UR
% BETA, Urine: 7.8 %
ALPHA 1 URINE: 8.3 %
Albumin, U: 75.6 %
Alpha 2, Urine: 7.8 %
Free Kappa Lt Chains,Ur: 35.71 mg/L (ref 1.17–86.46)
Free Kappa/Lambda Ratio: 0.34 — ABNORMAL LOW (ref 1.83–14.26)
Free Lambda Lt Chains,Ur: 103.88 mg/L — ABNORMAL HIGH (ref 0.27–15.21)
GAMMA GLOBULIN URINE: 0.6 %
Total Protein, Urine-Ur/day: 13432 mg/24 hr — ABNORMAL HIGH (ref 30–150)
Total Protein, Urine: 790.1 mg/dL
Total Volume: 1700

## 2020-08-20 NOTE — Progress Notes (Signed)
[facility] 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   PRIOR THERAPY: none  NGS Results: not done  CURRENT THERAPY: DaraCyBorD & Aloxi weekly  BRIEF ONCOLOGIC HISTORY:  Oncology History  Multiple myeloma (HCC)  03/06/2020 Initial Diagnosis   Multiple myeloma (HCC)   03/06/2020 Cancer Staging   Staging form: Plasma Cell Myeloma and Plasma Cell Disorders, AJCC 8th Edition - Clinical stage from 03/06/2020: RISS Stage II (Beta-2-microglobulin (mg/L): 4.5, Albumin (g/dL): 2.5, ISS: Stage II, High-risk cytogenetics: Absent, LDH: Elevated) - Signed by Doreatha Massed, MD on 03/06/2020   03/20/2020 - 03/20/2020 Chemotherapy   The patient had dexamethasone (DECADRON) tablet 40 mg, 40 mg, Oral,  Once, 0 of 4 cycles bortezomib SQ (VELCADE) chemo injection (2.5mg /mL concentration) 2.75 mg, 1.3 mg/m2 = 2.75 mg, Subcutaneous,  Once, 0 of 4 cycles  for chemotherapy treatment.    03/20/2020 -  Chemotherapy    Patient is on Treatment Plan: PRIMARY AMYLOIDOSIS DARACYBORD (DARATUMUMAB SQ + CYCLOPHOSPHAMIDE PO + BORTEZOMIB SQ + DEXAMETHASONE PO/IV) Q28D X 6 CYCLES / DARATUMUMAB SQ Q28D        CANCER STAGING: Cancer Staging Multiple myeloma (HCC) Staging form: Plasma Cell Myeloma and Plasma Cell Disorders, AJCC 8th Edition - Clinical stage from 03/06/2020: RISS Stage II (Beta-2-microglobulin (mg/L): 4.5, Albumin (g/dL): 2.5, ISS: Stage II, High-risk cytogenetics: Absent, LDH: Elevated) - Signed by Doreatha Massed, MD on 03/06/2020   INTERVAL HISTORY:  [name], a 70 y.o. male, returns for routine follow-up and consideration for next cycle of chemotherapy. Julian Miranda was last seen on 08/14/2020.  Due for day#15 cycle #6 of DaraCyBorD today.   Overall, he tells me he has been feeling pretty  well. He had an echocardiogram yesterday. He reports feeling dizzy and light-headed in the mornings, but he reports this is a chronic issue unchanged by current medication. He reports SOB and his pain is consistent and has not worsened since his last visit. He reports feeling easily full while eating. He has been trying to incorporate more protein into his diet. His energy levels have been consistent since his last visit. He gained 10-12 lbs prior to last week; this weight has since decreased. He has compression stockings and wears them occasionally.  Overall, he feels ready for next cycle of chemo today.   REVIEW OF SYSTEMS:  Review of Systems  Constitutional: Negative for appetite change and fatigue.  Respiratory: Positive for shortness of breath.   Cardiovascular: Positive for leg swelling.  Musculoskeletal: Positive for myalgias (bilateral leg pain 10/10).  Skin: Positive for wound (brusing on arm).  Neurological: Positive for dizziness and light-headedness.  All other systems reviewed and are negative.   PAST MEDICAL/SURGICAL HISTORY:  Past Medical History:  Diagnosis Date  . CAD (coronary artery disease)   . GERD (gastroesophageal reflux disease)   . Hypertension   . Myocardial infarction (HCC) 1994  . Peripheral vascular disease (HCC)   . Stroke [facility])    Past Surgical History:  Procedure Laterality Date  . BYPASS GRAFT POPLITEAL TO POPLITEAL Right 03/23/2018   Procedure: BYPASS GRAFT RIGHT ABOVE KNEE POPLITEAL TO BELOW KNEE POPLITEAL ARTERY  USING RIGHT GREAT SAPHENOUS VEIN;  Surgeon: Cephus Shelling, MD;  Location: [facility];  Service: Vascular;  Laterality: Right;  . BYPASS GRAFT POPLITEAL TO POPLITEAL Left 04/09/2019  Procedure: BYPASS  ABOVE KNEE POPLITEAL TO BELOW KNEE POPLITEAL AND LIGATION OF LEFT POPITEAL ANEURYSM;  Surgeon: Marty Heck, MD;  Location: [place];  Service: Vascular;  Laterality: Left;  . COLONOSCOPY WITH ESOPHAGOGASTRODUODENOSCOPY (EGD)    .  CORONARY ARTERY BYPASS GRAFT    . EYE SURGERY Bilateral    cataracts  . FEMORAL BYPASS Right 03/23/2018  . HERNIA REPAIR    . IR IMAGING GUIDED PORT INSERTION  03/25/2020  . MASTOIDECTOMY    . ROTATOR CUFF REPAIR Right   . TONSILLECTOMY    . VEIN HARVEST Right 03/23/2018   Procedure: VEIN HARVEST RIGHT GREAT SAPHENOUS;  Surgeon: Marty Heck, MD;  Location: [place];  Service: Vascular;  Laterality: Right;  . VEIN HARVEST Left 04/09/2019   Procedure: Harvest Greater Saphenous Vein and  Revise Elisa Lateral Saphenous Vein ;  Surgeon: Marty Heck, MD;  Location: [place];  Service: Vascular;  Laterality: Left;    SOCIAL HISTORY:  Social History   Socioeconomic History  . Marital status: Married    Spouse name: Not on file  . Number of children: Not on file  . Years of education: Not on file  . Highest education level: Not on file  Occupational History  . Not on file  Tobacco Use  . Smoking status: Never Smoker  . Smokeless tobacco: Never Used  Vaping Use  . Vaping Use: Never used  Substance and Sexual Activity  . Alcohol use: Never  . Drug use: Never  . Sexual activity: Not on file  Other Topics Concern  . Not on file  Social History Narrative  . Not on file   Social Determinants of Health   Financial Resource Strain: Low Risk   . Difficulty of Paying Living Expenses: Not hard at all  Food Insecurity: No Food Insecurity  . Worried About Charity fundraiser in the Last Year: Never true  . Ran Out of Food in the Last Year: Never true  Transportation Needs: No Transportation Needs  . Lack of Transportation (Medical): No  . Lack of Transportation (Non-Medical): No  Physical Activity: Insufficiently Active  . Days of Exercise per Week: 2 days  . Minutes of Exercise per Session: 20 min  Stress: Stress Concern Present  . Feeling of Stress : To some extent  Social Connections: Moderately Integrated  . Frequency of Communication with Friends and Family: More than  three times a week  . Frequency of Social Gatherings with Friends and Family: Twice a week  . Attends Religious Services: More than 4 times per year  . Active Member of Clubs or Organizations: No  . Attends Archivist Meetings: Never  . Marital Status: Married  Human resources officer Violence: Not At Risk  . Fear of Current or Ex-Partner: No  . Emotionally Abused: No  . Physically Abused: No  . Sexually Abused: No    FAMILY HISTORY:  Family History  Problem Relation Age of Onset  . Heart disease Mother     CURRENT MEDICATIONS:  Current Outpatient Medications  Medication Sig Dispense Refill  . acetaminophen (TYLENOL) 500 MG tablet Take 500 mg by mouth 2 (two) times daily.    Marland Kitchen acyclovir (ZOVIRAX) 400 MG tablet Take 1 tablet (400 mg total) by mouth 2 (two) times daily. 60 tablet 5  . ALPRAZolam (XANAX) 0.25 MG tablet Take 0.25 mg by mouth in the morning, at noon, in the evening, and at bedtime.     Marland Kitchen amLODipine (NORVASC) 10  MG tablet Take 10 mg by mouth daily.    Marland Kitchen aspirin 81 MG tablet Take 81 mg by mouth daily.    Marland Kitchen atorvastatin (LIPITOR) 80 MG tablet Take 80 mg by mouth at bedtime.     . bumetanide (BUMEX) 1 MG tablet Take 1 tablet (1 mg total) by mouth daily. Take 1 tablet in the am and 1/2 tablet in pm.  Do not give if top BP number less than 100. 60 tablet 3  . carvedilol (COREG) 25 MG tablet Take 25 mg by mouth daily.     . chlorproMAZINE (THORAZINE) 25 MG tablet Take 1 tablet (25 mg total) by mouth 4 (four) times daily as needed. For hiccups (Patient not taking: Reported on 08/14/2020) 60 tablet 2  . diazepam (VALIUM) 2 MG tablet Take 2 mg by mouth daily as needed (dizziness).    . ergocalciferol (VITAMIN D2) 1.25 MG (50000 UT) capsule Take 1 capsule (50,000 Units total) by mouth once a week. 8 capsule 3  . furosemide (LASIX) 20 MG tablet Take 1 tablet (20 mg total) by mouth 2 (two) times daily. 30 tablet 2  . gabapentin (NEURONTIN) 300 MG capsule Take 1 capsule (300 mg  total) by mouth daily. 30 capsule 6  . HYDROcodone-acetaminophen (NORCO) 5-325 MG tablet Take 1 tablet by mouth every 8 (eight) hours as needed for moderate pain. 60 tablet 0  . icosapent Ethyl (VASCEPA) 1 g capsule Take 1 g by mouth 2 (two) times daily.    . Iron-Vitamin C (VITRON-C) 65-125 MG TABS Take 1 tablet by mouth daily. 60 tablet 3  . lidocaine (XYLOCAINE) 2 % solution Use as directed 15 mLs in the mouth or throat as needed for mouth pain. 15 mL 3  . lidocaine-prilocaine (EMLA) cream Apply 1 application topically as needed. Apply to portacath as needed (Patient not taking: Reported on 08/14/2020) 30 g 0  . lisinopril (ZESTRIL) 20 MG tablet Take 20 mg by mouth in the morning and at bedtime.     . magnesium oxide (MAG-OX) 400 (241.3 Mg) MG tablet Take 1 tablet (400 mg total) by mouth daily. 30 tablet 0  . magnesium oxide (MAG-OX) 400 MG tablet Take by mouth.    . meclizine (ANTIVERT) 25 MG tablet Take 1 tablet (25 mg total) by mouth 2 (two) times daily as needed for dizziness. 30 tablet 3  . megestrol (MEGACE) 400 MG/10ML suspension Take 10 mLs (400 mg total) by mouth 2 (two) times daily. 480 mL 2  . Multiple Minerals (CALCIUM/MAGNESIUM/ZINC PO) Take 1 tablet by mouth at bedtime.    Marland Kitchen omeprazole (PRILOSEC) 20 MG capsule Take 20 mg by mouth 2 (two) times daily.     . ondansetron (ZOFRAN) 4 MG tablet Take 4 mg by mouth every 8 (eight) hours as needed for nausea. (Patient not taking: Reported on 08/14/2020)    . potassium chloride (KLOR-CON) 10 MEQ tablet Take 1 tablet (10 mEq total) by mouth 2 (two) times daily. 90 tablet 3  . prochlorperazine (COMPAZINE) 10 MG tablet Take 1 tablet (10 mg total) by mouth every 6 (six) hours as needed for nausea or vomiting. (Patient not taking: Reported on 08/14/2020) 60 tablet 2  . scopolamine (TRANSDERM-SCOP) 1 MG/3DAYS Transderm-Scop 1 mg over 3 days transdermal patch  Apply 1 patch by transdermal route.    . sildenafil (REVATIO) 20 MG tablet Take 20 mg by  mouth daily.    . traMADol (ULTRAM) 50 MG tablet TAKE ONE TABLET BY MOUTH EVERY 8  HOURS AS NEEDED FOR SEVERE PAIN 60 tablet 0  . vitamin B-12 (CYANOCOBALAMIN) 1000 MCG tablet Take 1,000 mcg by mouth daily.    Marland Kitchen zinc gluconate 50 MG tablet Take 50 mg by mouth daily.     Marland Kitchen zolpidem (AMBIEN) 10 MG tablet Take 1 tablet (10 mg total) by mouth at bedtime as needed for sleep. 20 tablet 0   No current facility-administered medications for this visit.    ALLERGIES:  Allergies  Allergen Reactions  . Vancomycin     Other reaction(s): Red Man Syndrome Noted intraoperatively on 10/04/2018. No associated hemodynamic or respiratory changes. Please give slowly.    PHYSICAL EXAM:  Performance status (ECOG): 1 - Symptomatic but completely ambulatory  There were no vitals filed for this visit. Wt Readings from Last 3 Encounters:  08/14/20 203 lb 1.6 oz (92.1 kg)  07/31/20 197 lb 1.6 oz (89.4 kg)  07/24/20 194 lb 9.6 oz (88.3 kg)   Physical Exam Vitals reviewed.  Constitutional:      Appearance: Normal appearance.  Cardiovascular:     Rate and Rhythm: Normal rate and regular rhythm.     Pulses: Normal pulses.     Heart sounds: Normal heart sounds.  Pulmonary:     Effort: Pulmonary effort is normal.     Breath sounds: Normal breath sounds.  Abdominal:     Palpations: Abdomen is soft. There is no hepatomegaly, splenomegaly or mass.     Tenderness: There is no abdominal tenderness.  Musculoskeletal:     Right lower leg: No edema.     Left lower leg: No edema.  Neurological:     General: No focal deficit present.     Mental Status: He is alert and oriented to person, place, and time.  Psychiatric:        Mood and Affect: Mood normal.        Behavior: Behavior normal.     LABORATORY DATA:  I have reviewed the labs as listed.  CBC Latest Ref Rng & Units 08/14/2020 08/07/2020 07/31/2020  WBC 4.0 - 10.5 K/uL 10.2 11.1(H) 10.6(H)  Hemoglobin 13.0 - 17.0 g/dL 9.1(L) 9.3(L) 9.4(L)  Hematocrit  39.0 - 52.0 % 27.7(L) 28.2(L) 29.4(L)  Platelets 150 - 400 K/uL 159 153 155   CMP Latest Ref Rng & Units 08/14/2020 08/07/2020 07/31/2020  Glucose 70 - 99 mg/dL 94 95 91  BUN 8 - 23 mg/dL 23 24(H) 26(H)  Creatinine 0.61 - 1.24 mg/dL 1.33(H) 1.32(H) 1.24  Sodium 135 - 145 mmol/L 134(L) 138 137  Potassium 3.5 - 5.1 mmol/L 3.9 3.9 4.0  Chloride 98 - 111 mmol/L 99 103 101  CO2 22 - 32 mmol/L $RemoveB'29 29 28  'sSDRRqey$ Calcium 8.9 - 10.3 mg/dL 7.8(L) 7.9(L) 8.2(L)  Total Protein 6.5 - 8.1 g/dL 4.8(L) 4.8(L) 4.8(L)  Total Bilirubin 0.3 - 1.2 mg/dL 0.6 0.5 0.3  Alkaline Phos 38 - 126 U/L 108 112 120  AST 15 - 41 U/L 10(L) 12(L) 13(L)  ALT 0 - 44 U/L $Remo'10 10 11    'gnWZh$ DIAGNOSTIC IMAGING:  I have independently reviewed the scans and discussed with the patient. No results found.   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 Joylene Grapes at [facility]-SPEP 0.2 g, immunofixation with biclonal IgG with lambda specificity. -Free kappa light chain 16.7, lambda light chains 122.7, ratio 0.14. -Kidney biopsy on 02/04/2020 with AL amyloidosis, lambda immunophenotype, Congo red stain positive. Immunofluorescence microscopy shows  dense homogeneous glomerular mesangial staining with antisera specific for IgG 1-2+, IgM 2+, C1q 2+, lambda light chains 3+. Waxy homogeneous staining noted along the blood vessels. Congo red stain demonstrates positive apocrine birefringence within the glomerular mesangial regions and along blood vessel walls. -48 pound weight loss since summer 2021 with decreased appetite. Positive for fatigue. -Reports occasional nausea, no vomiting. Positive for occipital headaches. -Denies any tingling or numbness in the extremities. He has cramps in the left arm which is new. He had leg cramps for a long time. -Cardiac MRI on 02/29/2020 with EF 38%, no evidence of amyloid deposits in the  myocardium. -PET scan on 02/26/2020 with scattered borderline mediastinal and hilar lymph nodes with low-level hypermetabolism most likely inflammatory or reactive. No bone abnormalities. No findings in the abdomen or pelvis. -Bone marrow biopsy on 02/26/2020 with slightly hypercellular bone marrow with 23% cells consistent with plasma cells, staining for lambda light chain. No lymphoproliferative disorder. Congo red stain was negative. -Bone marrow cytogenetics were normal. FISH panel for multiple myeloma was negative. -M spike was 0.1 g, immunofixation shows IgG lambda. LDH was elevated at 216. Beta-2 microglobulin 4.5. -Dara-CyBorD started on 03/20/2020.  2. Social/family history: -He works for [employer] and retired 5 years ago. Never smoker. -Maternal aunt had cancer in male organs. One maternal first cousin had appendiceal cancer, another maternal first cousin had cancer of the digestive system.   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 - Since we started him on Bumex, he lost about 6 pounds in the last 1 week. - His energy levels are stable.  Reviewed his labs which showed creatinine bumped up to 1.72 from 1.33.  Alk phos is 128 and elevated from last time.  CBC was grossly normal. - Labs from 08/14/2020 shows M spike 0.1 g.  Free light chain ratio has improved to 0.39.  Immunofixation shows IgG kappa and IgG lambda. - 24-hour urine shows total protein 13.4 g which is also an improvement from last time. - I will proceed with his treatment today.  I will cut back on Decadron to 10 mg with treatment.  We will decrease Cytoxan to 300 mg flat dose. - I will reevaluate him in 1 week with labs and treatment. - He was seen by Dr. Virgilio Belling office yesterday and had echocardiogram.  We will follow-up on the results.  2. Nausea: -Continue Compazine  as needed.  3.Leg weakness/pains: -Left sural nerve biopsy on 07/02/2020 at [facility] did not show any evidence of amyloidosis.  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 - Continue tramadol and hydrocodone as needed for moderate to severe pain.  4. Sleeping difficulty: -He is not on any sleep medication at this time.  Sleeping is reasonably well.  5. Loss of appetite: -He is eating well and is not taking Megace.  6. Lower extremity swelling: -He is taking Bumex 1 mg in the morning and half milligram in the evening. - He lost 6 pounds in the last 1 week.  His swelling in the legs is stable. - Creatinine today increased to 1.7 from 1.33. - We will cut back on Bumex to 0.5 mg twice daily.   Orders placed this encounter:  No orders of the defined types were placed in this encounter.    Derek Jack, MD [facility] [phone]   I,  Thana Ates, am acting as a Education administrator for Dr. Derek Jack.  I, Derek Jack MD, have reviewed the above documentation for accuracy and completeness, and I agree with the above.

## 2020-08-21 ENCOUNTER — Encounter (HOSPITAL_COMMUNITY): Payer: Self-pay | Admitting: Hematology

## 2020-08-21 ENCOUNTER — Inpatient Hospital Stay (HOSPITAL_COMMUNITY): Payer: Medicare Other

## 2020-08-21 ENCOUNTER — Inpatient Hospital Stay (HOSPITAL_COMMUNITY): Payer: Medicare Other | Admitting: Dietician

## 2020-08-21 ENCOUNTER — Inpatient Hospital Stay (HOSPITAL_BASED_OUTPATIENT_CLINIC_OR_DEPARTMENT_OTHER): Payer: Medicare Other | Admitting: Hematology

## 2020-08-21 ENCOUNTER — Other Ambulatory Visit: Payer: Self-pay

## 2020-08-21 VITALS — BP 92/55 | HR 78 | Temp 97.0°F | Resp 18

## 2020-08-21 VITALS — BP 91/57 | HR 85 | Temp 97.5°F | Resp 19 | Wt 197.8 lb

## 2020-08-21 DIAGNOSIS — C9000 Multiple myeloma not having achieved remission: Secondary | ICD-10-CM

## 2020-08-21 DIAGNOSIS — Z5111 Encounter for antineoplastic chemotherapy: Secondary | ICD-10-CM | POA: Diagnosis not present

## 2020-08-21 DIAGNOSIS — E8581 Light chain (AL) amyloidosis: Secondary | ICD-10-CM | POA: Diagnosis not present

## 2020-08-21 LAB — COMPREHENSIVE METABOLIC PANEL
ALT: 10 U/L (ref 0–44)
AST: 11 U/L — ABNORMAL LOW (ref 15–41)
Albumin: 1.8 g/dL — ABNORMAL LOW (ref 3.5–5.0)
Alkaline Phosphatase: 128 U/L — ABNORMAL HIGH (ref 38–126)
Anion gap: 6 (ref 5–15)
BUN: 23 mg/dL (ref 8–23)
CO2: 28 mmol/L (ref 22–32)
Calcium: 7.7 mg/dL — ABNORMAL LOW (ref 8.9–10.3)
Chloride: 100 mmol/L (ref 98–111)
Creatinine, Ser: 1.72 mg/dL — ABNORMAL HIGH (ref 0.61–1.24)
GFR, Estimated: 42 mL/min — ABNORMAL LOW (ref >60)
Glucose, Bld: 100 mg/dL — ABNORMAL HIGH (ref 70–99)
Potassium: 4.1 mmol/L (ref 3.5–5.1)
Sodium: 134 mmol/L — ABNORMAL LOW (ref 135–145)
Total Bilirubin: 0.5 mg/dL (ref 0.3–1.2)
Total Protein: 4.8 g/dL — ABNORMAL LOW (ref 6.5–8.1)

## 2020-08-21 LAB — IMMUNOFIXATION ELECTROPHORESIS
IgA: 19 mg/dL — ABNORMAL LOW (ref 61–437)
IgG (Immunoglobin G), Serum: 257 mg/dL — ABNORMAL LOW (ref 603–1613)
IgM (Immunoglobulin M), Srm: 49 mg/dL (ref 20–172)
Total Protein ELP: 4.1 g/dL — ABNORMAL LOW (ref 6.0–8.5)

## 2020-08-21 LAB — CBC WITH DIFFERENTIAL/PLATELET
Abs Immature Granulocytes: 0.08 10*3/uL — ABNORMAL HIGH (ref 0.00–0.07)
Basophils Absolute: 0 10*3/uL (ref 0.0–0.1)
Basophils Relative: 0 %
Eosinophils Absolute: 0.4 10*3/uL (ref 0.0–0.5)
Eosinophils Relative: 4 %
HCT: 28.6 % — ABNORMAL LOW (ref 39.0–52.0)
Hemoglobin: 9.2 g/dL — ABNORMAL LOW (ref 13.0–17.0)
Immature Granulocytes: 1 %
Lymphocytes Relative: 11 %
Lymphs Abs: 1.1 10*3/uL (ref 0.7–4.0)
MCH: 33.9 pg (ref 26.0–34.0)
MCHC: 32.2 g/dL (ref 30.0–36.0)
MCV: 105.5 fL — ABNORMAL HIGH (ref 80.0–100.0)
Monocytes Absolute: 0.6 10*3/uL (ref 0.1–1.0)
Monocytes Relative: 6 %
Neutro Abs: 7.5 10*3/uL (ref 1.7–7.7)
Neutrophils Relative %: 78 %
Platelets: 217 10*3/uL (ref 150–400)
RBC: 2.71 MIL/uL — ABNORMAL LOW (ref 4.22–5.81)
RDW: 15.9 % — ABNORMAL HIGH (ref 11.5–15.5)
WBC: 9.7 10*3/uL (ref 4.0–10.5)
nRBC: 0 % (ref 0.0–0.2)

## 2020-08-21 MED ORDER — HEPARIN SOD (PORK) LOCK FLUSH 100 UNIT/ML IV SOLN
500.0000 [IU] | Freq: Once | INTRAVENOUS | Status: AC
  Administered 2020-08-21: 500 [IU] via INTRAVENOUS

## 2020-08-21 MED ORDER — DIPHENHYDRAMINE HCL 50 MG/ML IJ SOLN
INTRAMUSCULAR | Status: AC
  Filled 2020-08-21: qty 1

## 2020-08-21 MED ORDER — CYCLOPHOSPHAMIDE CHEMO INJECTION 1 GM
300.0000 mg | Freq: Once | INTRAMUSCULAR | Status: AC
  Administered 2020-08-21: 300 mg via INTRAVENOUS
  Filled 2020-08-21: qty 15

## 2020-08-21 MED ORDER — PALONOSETRON HCL INJECTION 0.25 MG/5ML
0.2500 mg | Freq: Once | INTRAVENOUS | Status: AC
  Administered 2020-08-21: 0.25 mg via INTRAVENOUS

## 2020-08-21 MED ORDER — DARATUMUMAB-HYALURONIDASE-FIHJ 1800-30000 MG-UT/15ML ~~LOC~~ SOLN
1800.0000 mg | Freq: Once | SUBCUTANEOUS | Status: AC
  Administered 2020-08-21: 1800 mg via SUBCUTANEOUS
  Filled 2020-08-21: qty 15

## 2020-08-21 MED ORDER — ACETAMINOPHEN 325 MG PO TABS
650.0000 mg | ORAL_TABLET | Freq: Once | ORAL | Status: AC
Start: 2020-08-21 — End: 2020-08-21
  Administered 2020-08-21: 650 mg via ORAL

## 2020-08-21 MED ORDER — SODIUM CHLORIDE 0.9% FLUSH
10.0000 mL | Freq: Once | INTRAVENOUS | Status: AC
  Administered 2020-08-21: 10 mL via INTRAVENOUS

## 2020-08-21 MED ORDER — SODIUM CHLORIDE 0.9 % IV SOLN
Freq: Once | INTRAVENOUS | Status: AC
Start: 2020-08-21 — End: 2020-08-21

## 2020-08-21 MED ORDER — DIPHENHYDRAMINE HCL 25 MG PO CAPS
ORAL_CAPSULE | ORAL | Status: AC
  Filled 2020-08-21: qty 2

## 2020-08-21 MED ORDER — SODIUM CHLORIDE 0.9 % IV SOLN
10.0000 mg | Freq: Once | INTRAVENOUS | Status: AC
  Administered 2020-08-21: 10 mg via INTRAVENOUS
  Filled 2020-08-21: qty 10

## 2020-08-21 MED ORDER — PALONOSETRON HCL INJECTION 0.25 MG/5ML
INTRAVENOUS | Status: AC
  Filled 2020-08-21: qty 5

## 2020-08-21 MED ORDER — BORTEZOMIB CHEMO SQ INJECTION 3.5 MG (2.5MG/ML)
1.3000 mg/m2 | Freq: Once | INTRAMUSCULAR | Status: AC
  Administered 2020-08-21: 2.75 mg via SUBCUTANEOUS
  Filled 2020-08-21: qty 1.1

## 2020-08-21 MED ORDER — ACETAMINOPHEN 325 MG PO TABS
ORAL_TABLET | ORAL | Status: AC
  Filled 2020-08-21: qty 2

## 2020-08-21 MED ORDER — DIPHENHYDRAMINE HCL 25 MG PO CAPS
50.0000 mg | ORAL_CAPSULE | Freq: Once | ORAL | Status: AC
  Administered 2020-08-21: 50 mg via ORAL

## 2020-08-21 NOTE — Progress Notes (Signed)
Nutrition Follow-up:  Patient with multiple myeloma. He is receiving DarcyboD.  Met with patient and wife in infusion. He reports good appetite and eating well. Patient had roast beef last night and looking forward to eating breakfast for dinner this evening. He is going to have eggs, sausage, biscuit with gravy, and fried apples. Patient continues to drink 2 Boost daily and has eaten fish, hotdogs, hamburger steak, [place] steak, and meatloaf from [employer] over the past week. Patient is drinking 5-6 bottles of water daily and has coffee with orange juice every morning.    Medications: reviewed  Labs: Na 134, Glucose 100  Anthropometrics: Weight 197 lb 12.8 oz today decreased 6 lbs from 203 lb 1.6 oz on 5/12 (suspect r//t fluid shift - noted5/12 treatment held d/t fluid accumulation)    NUTRITION DIAGNOSIS: Inadequate oral intake improved   INTERVENTION:  Continue eating high calorie, high protein foods for weight maintenance Continue drinking 2 Boost Plus daily  Encouraged limiting high sodium foods     MONITORING, EVALUATION, GOAL: weight trends, intake   NEXT VISIT: To be scheduled ~3-4 weeks with treatment

## 2020-08-21 NOTE — Progress Notes (Signed)
Patient presents today for Daratumuab/Velcade/Cytoxan per MD orders.  Vital signs within parameters for treatment.  Labs pending.  Patient is still having pain in his bilateral legs and some mild dyspnea, unchanged since last treatment.  Advised the patient to speak with MD about these issues during his office visit.  Message received from [place] patient okay for treatment.  Cytoxan infusion and Velcade/Daratumuab injections given today per MD orders.  Stable during injections and  infusion without adverse affects.  Injection sites WNL.  Vital signs stable.  No complaints at this time.  Discharge from clinic ambulatory in stable condition.  Alert and oriented X 3.  Follow up with [facility] as scheduled.

## 2020-08-21 NOTE — Patient Instructions (Signed)

## 2020-08-21 NOTE — Patient Instructions (Signed)
[place] at [facility] Discharge Instructions  You were seen today by Dr. Delton Coombes. He went over your recent results, and you received treatment. Begin wearing compression stockings regularly. Dr. Delton Coombes will see you back in 1 week for labs and follow up.   Thank you for choosing [place] at [facility] to provide your oncology and hematology care.  To afford each patient quality time with our provider, please arrive at least 15 minutes before your scheduled appointment time.   If you have a lab appointment with the [facility] please come in thru the Main Entrance and check in at the main information desk  You need to re-schedule your appointment should you arrive 10 or more minutes late.  We strive to give you quality time with our providers, and arriving late affects you and other patients whose appointments are after yours.  Also, if you no show three or more times for appointments you may be dismissed from the clinic at the providers discretion.     Again, thank you for choosing [facility].  Our hope is that these requests will decrease the amount of time that you wait before being seen by our physicians.       _____________________________________________________________  Should you have questions after your visit to [facility], please contact our office at [phone] between the hours of 8:00 a.m. and 4:30 p.m.  Voicemails left after 4:00 p.m. will not be returned until the following business day.  For prescription refill requests, have your pharmacy contact our office and allow 72 hours.    Cancer Center Support Programs:   > Cancer Support Group  2nd Tuesday of the month 1pm-2pm, Journey Room

## 2020-08-21 NOTE — Progress Notes (Signed)
Patient was assessed by Dr. Delton Coombes and labs have been reviewed.  Patient is okay to proceed with treatment today pending labs. Primary RN and pharmacy aware.

## 2020-08-21 NOTE — Progress Notes (Signed)
OK to proceed with treatment with scr 1.72.  Cyclophosphamide dose decreased to 300 mg flat dose today due to elevated scr 1.72.  Dexamethasone dose decreased to 10 mg IVPB today per MD.  T.O. Dr Rhys Martini, PharmD

## 2020-08-21 NOTE — Patient Instructions (Signed)
[facility]  Discharge Instructions: Thank you for choosing [facility] to provide your oncology and hematology care.  If you have a lab appointment with the [facility], please come in thru the Main Entrance and check in at the main information desk.  Wear comfortable clothing and clothing appropriate for easy access to any Portacath or PICC line.   We strive to give you quality time with your provider. You may need to reschedule your appointment if you arrive late (15 or more minutes).  Arriving late affects you and other patients whose appointments are after yours.  Also, if you miss three or more appointments without notifying the office, you may be dismissed from the clinic at the provider's discretion.      For prescription refill requests, have your pharmacy contact our office and allow 72 hours for refills to be completed.    Today you received the following chemotherapy and/or immunotherapy agents Cytoxan/Velcade/Daratumuab     To help prevent nausea and vomiting after your treatment, we encourage you to take your nausea medication as directed.  BELOW ARE SYMPTOMS THAT SHOULD BE REPORTED IMMEDIATELY: . *FEVER GREATER THAN 100.4 F (38 C) OR HIGHER . *CHILLS OR SWEATING . *NAUSEA AND VOMITING THAT IS NOT CONTROLLED WITH YOUR NAUSEA MEDICATION . *UNUSUAL SHORTNESS OF BREATH . *UNUSUAL BRUISING OR BLEEDING . *URINARY PROBLEMS (pain or burning when urinating, or frequent urination) . *BOWEL PROBLEMS (unusual diarrhea, constipation, pain near the anus) . TENDERNESS IN MOUTH AND THROAT WITH OR WITHOUT PRESENCE OF ULCERS (sore throat, sores in mouth, or a toothache) . UNUSUAL RASH, SWELLING OR PAIN  . UNUSUAL VAGINAL DISCHARGE OR ITCHING   Items with * indicate a potential emergency and should be followed up as soon as possible or go to the Emergency Department if any problems should occur.  Please show the CHEMOTHERAPY ALERT CARD or IMMUNOTHERAPY ALERT CARD  at check-in to the Emergency Department and triage nurse.  Should you have questions after your visit or need to cancel or reschedule your appointment, please contact [facility] [phone]  and follow the prompts.  Office hours are 8:00 a.m. to 4:30 p.m. Monday - Friday. Please note that voicemails left after 4:00 p.m. may not be returned until the following business day.  We are closed weekends and major holidays. You have access to a nurse at all times for urgent questions. Please call the main number to the clinic [phone] and follow the prompts.  For any non-urgent questions, you may also contact your provider using MyChart. We now offer e-Visits for anyone 54 and older to request care online for non-urgent symptoms. For details visit [email or web address].   Also download the MyChart app! Go to the app store, search "MyChart", open the app, select [facility], and log in with your MyChart username and password.  Due to Covid, a mask is required upon entering the hospital/clinic. If you do not have a mask, one will be given to you upon arrival. For doctor visits, patients may have 1 support person aged 39 or older with them. For treatment visits, patients cannot have anyone with them due to current Covid guidelines and our immunocompromised population.

## 2020-08-21 NOTE — Progress Notes (Signed)
Patient presents today for Daratumuab/Velcade/Cytoxan per MD orders.  Vital signs within parameters for treatment.  Labs pending.  Patient is still having pain in his bilateral legs and some mild dyspnea, unchanged since last treatment.  Advised the patient to speak with MD about these issues during his office visit.

## 2020-08-27 NOTE — Progress Notes (Signed)
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   PRIOR THERAPY: none  NGS Results: not done  CURRENT THERAPY: DaraCyBorD & Aloxi weekly  BRIEF ONCOLOGIC HISTORY:  Oncology History  Multiple myeloma ([facility])  03/06/2020 Initial Diagnosis   Multiple myeloma ([facility])   03/06/2020 Cancer Staging   Staging form: Plasma Cell Myeloma and Plasma Cell Disorders, AJCC 8th Edition - Clinical stage from 03/06/2020: RISS Stage II (Beta-2-microglobulin (mg/L): 4.5, Albumin (g/dL): 2.5, ISS: Stage II, High-risk cytogenetics: Absent, LDH: Elevated) - Signed by Derek Jack, MD on 03/06/2020   03/20/2020 - 03/20/2020 Chemotherapy   The patient had dexamethasone (DECADRON) tablet 40 mg, 40 mg, Oral,  Once, 0 of 4 cycles bortezomib SQ (VELCADE) chemo injection (2.55m/mL concentration) 2.75 mg, 1.3 mg/m2 = 2.75 mg, Subcutaneous,  Once, 0 of 4 cycles  for chemotherapy treatment.    03/20/2020 -  Chemotherapy    Patient is on Treatment Plan: PRIMARY AMYLOIDOSIS DARACYBORD (DARATUMUMAB SQ + CYCLOPHOSPHAMIDE PO + BORTEZOMIB SQ + DEXAMETHASONE PO/IV) Q28D X 6 CYCLES / DARATUMUMAB SQ Q28D        CANCER STAGING: Cancer Staging Multiple myeloma ([facility] Staging form: Plasma Cell Myeloma and Plasma Cell Disorders, AJCC 8th Edition - Clinical stage from 03/06/2020: RISS Stage II (Beta-2-microglobulin (mg/L): 4.5, Albumin (g/dL): 2.5, ISS: Stage II, High-risk cytogenetics: Absent, LDH: Elevated) - Signed by KDerek Jack MD on 03/06/2020   INTERVAL HISTORY:  Mr. TDearies Meikle a 70y.o. male, returns for routine follow-up and consideration for next cycle of chemotherapy. TEmmittewas last seen on 08/21/2020.  Due for day #22 cycle #6 of DaraCyBorD today.   Overall, he tells me he has been feeling pretty  well. He has been wearing compression stocking that go up to the knee for the past week; this has improved his leg swelling slightly. He is taking spirolactone once daily which he reports has helped increase frequency of urination. He denies any falls and energy levels are at baseline. He reports spontaneous and easy bruising.   Overall, he feels ready for next cycle of chemo today.   REVIEW OF SYSTEMS:  Review of Systems  Constitutional: Positive for appetite change (75%) and fatigue (50%).  Respiratory: Positive for shortness of breath.   Cardiovascular: Positive for leg swelling (improved).  Genitourinary: Positive for frequency.   Neurological: Positive for dizziness.  Hematological: Bruises/bleeds easily.  Psychiatric/Behavioral: Positive for sleep disturbance (frequent bathroom visits).  All other systems reviewed and are negative.   PAST MEDICAL/SURGICAL HISTORY:  Past Medical History:  Diagnosis Date  . CAD (coronary artery disease)   . GERD (gastroesophageal reflux disease)   . Hypertension   . Myocardial infarction ([facility] 1994  . Peripheral vascular disease ([facility]   . Stroke ([facility]    Past Surgical History:  Procedure Laterality Date  . BYPASS GRAFT POPLITEAL TO POPLITEAL Right 03/23/2018   Procedure: BYPASS GRAFT RIGHT ABOVE KNEE POPLITEAL TO BELOW KNEE POPLITEAL ARTERY  USING RIGHT GREAT SAPHENOUS VEIN;  Surgeon: CMarty Heck MD;  [facility]  Service: Vascular;  Laterality: Right;  . BYPASS GRAFT POPLITEAL TO POPLITEAL Left 04/09/2019   Procedure: BYPASS  ABOVE KNEE POPLITEAL TO BELOW KNEE POPLITEAL AND LIGATION OF LEFT POPITEAL ANEURYSM;  Surgeon: CMarty Heck MD;  [facility]  Service: Vascular;  Laterality:  Left;  . COLONOSCOPY WITH ESOPHAGOGASTRODUODENOSCOPY (EGD)    . CORONARY ARTERY BYPASS GRAFT    . EYE SURGERY Bilateral    cataracts  . FEMORAL BYPASS Right 03/23/2018  . HERNIA REPAIR    . IR IMAGING GUIDED PORT INSERTION  03/25/2020   . MASTOIDECTOMY    . ROTATOR CUFF REPAIR Right   . TONSILLECTOMY    . VEIN HARVEST Right 03/23/2018   Procedure: VEIN HARVEST RIGHT GREAT SAPHENOUS;  Surgeon: Marty Heck, MD;  Location: [facility];  Service: Vascular;  Laterality: Right;  . VEIN HARVEST Left 04/09/2019   Procedure: Harvest Greater Saphenous Vein and  Revise Elisa Lateral Saphenous Vein ;  Surgeon: Marty Heck, MD;  Location: [facility];  Service: Vascular;  Laterality: Left;    SOCIAL HISTORY:  Social History   Socioeconomic History  . Marital status: Married    Spouse name: Not on file  . Number of children: Not on file  . Years of education: Not on file  . Highest education level: Not on file  Occupational History  . Not on file  Tobacco Use  . Smoking status: Never Smoker  . Smokeless tobacco: Never Used  Vaping Use  . Vaping Use: Never used  Substance and Sexual Activity  . Alcohol use: Never  . Drug use: Never  . Sexual activity: Not on file  Other Topics Concern  . Not on file  Social History Narrative  . Not on file   Social Determinants of Health   Financial Resource Strain: Low Risk   . Difficulty of Paying Living Expenses: Not hard at all  Food Insecurity: No Food Insecurity  . Worried About Charity fundraiser in the Last Year: Never true  . Ran Out of Food in the Last Year: Never true  Transportation Needs: No Transportation Needs  . Lack of Transportation (Medical): No  . Lack of Transportation (Non-Medical): No  Physical Activity: Insufficiently Active  . Days of Exercise per Week: 2 days  . Minutes of Exercise per Session: 20 min  Stress: Stress Concern Present  . Feeling of Stress : To some extent  Social Connections: Moderately Integrated  . Frequency of Communication with Friends and Family: More than three times a week  . Frequency of Social Gatherings with Friends and Family: Twice a week  . Attends Religious Services: More than 4 times per year  . Active Member of  Clubs or Organizations: No  . Attends Archivist Meetings: Never  . Marital Status: Married  Human resources officer Violence: Not At Risk  . Fear of Current or Ex-Partner: No  . Emotionally Abused: No  . Physically Abused: No  . Sexually Abused: No    FAMILY HISTORY:  Family History  Problem Relation Age of Onset  . Heart disease Mother     CURRENT MEDICATIONS:  Current Outpatient Medications  Medication Sig Dispense Refill  . acetaminophen (TYLENOL) 500 MG tablet Take 500 mg by mouth 2 (two) times daily.    Marland Kitchen acyclovir (ZOVIRAX) 400 MG tablet Take 1 tablet (400 mg total) by mouth 2 (two) times daily. 60 tablet 5  . ALPRAZolam (XANAX) 0.25 MG tablet Take 0.25 mg by mouth in the morning, at noon, in the evening, and at bedtime.     Marland Kitchen amLODipine (NORVASC) 10 MG tablet Take 10 mg by mouth daily.    Marland Kitchen aspirin 81 MG tablet Take 81 mg by mouth daily.    Marland Kitchen atorvastatin (LIPITOR) 80 MG tablet  Take 80 mg by mouth at bedtime.     . bumetanide (BUMEX) 1 MG tablet Take 1 tablet (1 mg total) by mouth daily. Take 1 tablet in the am and 1/2 tablet in pm.  Do not give if top BP number less than 100. 60 tablet 3  . carvedilol (COREG) 25 MG tablet Take 25 mg by mouth daily.     . chlorproMAZINE (THORAZINE) 25 MG tablet Take 1 tablet (25 mg total) by mouth 4 (four) times daily as needed. For hiccups 60 tablet 2  . diazepam (VALIUM) 2 MG tablet Take 2 mg by mouth daily as needed (dizziness).    . ergocalciferol (VITAMIN D2) 1.25 MG (50000 UT) capsule Take 1 capsule (50,000 Units total) by mouth once a week. 8 capsule 3  . furosemide (LASIX) 20 MG tablet Take 1 tablet (20 mg total) by mouth 2 (two) times daily. 30 tablet 2  . gabapentin (NEURONTIN) 300 MG capsule Take 1 capsule (300 mg total) by mouth daily. 30 capsule 6  . HYDROcodone-acetaminophen (NORCO) 5-325 MG tablet Take 1 tablet by mouth every 8 (eight) hours as needed for moderate pain. 60 tablet 0  . icosapent Ethyl (VASCEPA) 1 g capsule  Take 1 g by mouth 2 (two) times daily.    . Iron-Vitamin C (VITRON-C) 65-125 MG TABS Take 1 tablet by mouth daily. 60 tablet 3  . lidocaine (XYLOCAINE) 2 % solution Use as directed 15 mLs in the mouth or throat as needed for mouth pain. 15 mL 3  . lidocaine-prilocaine (EMLA) cream Apply 1 application topically as needed. Apply to portacath as needed 30 g 0  . lisinopril (ZESTRIL) 20 MG tablet Take 20 mg by mouth in the morning and at bedtime.     . magnesium oxide (MAG-OX) 400 (241.3 Mg) MG tablet Take 1 tablet (400 mg total) by mouth daily. 30 tablet 0  . magnesium oxide (MAG-OX) 400 MG tablet Take by mouth.    . meclizine (ANTIVERT) 25 MG tablet Take 1 tablet (25 mg total) by mouth 2 (two) times daily as needed for dizziness. 30 tablet 3  . megestrol (MEGACE) 400 MG/10ML suspension Take 10 mLs (400 mg total) by mouth 2 (two) times daily. 480 mL 2  . Multiple Minerals (CALCIUM/MAGNESIUM/ZINC PO) Take 1 tablet by mouth at bedtime.    Marland Kitchen omeprazole (PRILOSEC) 20 MG capsule Take 20 mg by mouth 2 (two) times daily.     . ondansetron (ZOFRAN) 4 MG tablet Take 4 mg by mouth every 8 (eight) hours as needed for nausea.    . potassium chloride (KLOR-CON) 10 MEQ tablet Take 1 tablet (10 mEq total) by mouth 2 (two) times daily. 90 tablet 3  . prochlorperazine (COMPAZINE) 10 MG tablet Take 1 tablet (10 mg total) by mouth every 6 (six) hours as needed for nausea or vomiting. 60 tablet 2  . scopolamine (TRANSDERM-SCOP) 1 MG/3DAYS Transderm-Scop 1 mg over 3 days transdermal patch  Apply 1 patch by transdermal route.    . sildenafil (REVATIO) 20 MG tablet Take 20 mg by mouth daily.    . traMADol (ULTRAM) 50 MG tablet TAKE ONE TABLET BY MOUTH EVERY 8 HOURS AS NEEDED FOR SEVERE PAIN 60 tablet 0  . vitamin B-12 (CYANOCOBALAMIN) 1000 MCG tablet Take 1,000 mcg by mouth daily.    Marland Kitchen zinc gluconate 50 MG tablet Take 50 mg by mouth daily.     Marland Kitchen zolpidem (AMBIEN) 10 MG tablet Take 1 tablet (10 mg total) by mouth  at  bedtime as needed for sleep. 20 tablet 0   No current facility-administered medications for this visit.    ALLERGIES:  Allergies  Allergen Reactions  . Vancomycin     Other reaction(s): Red Man Syndrome Noted intraoperatively on 10/04/2018. No associated hemodynamic or respiratory changes. Please give slowly.    PHYSICAL EXAM:  Performance status (ECOG): 1 - Symptomatic but completely ambulatory  There were no vitals filed for this visit. Wt Readings from Last 3 Encounters:  08/21/20 197 lb 12.8 oz (89.7 kg)  08/14/20 203 lb 1.6 oz (92.1 kg)  07/31/20 197 lb 1.6 oz (89.4 kg)   Physical Exam Vitals reviewed.  Constitutional:      Appearance: Normal appearance.  Cardiovascular:     Rate and Rhythm: Normal rate and regular rhythm.     Pulses: Normal pulses.     Heart sounds: Normal heart sounds.  Pulmonary:     Effort: Pulmonary effort is normal.     Breath sounds: Normal breath sounds.  Neurological:     General: No focal deficit present.     Mental Status: He is alert and oriented to person, place, and time.  Psychiatric:        Mood and Affect: Mood normal.        Behavior: Behavior normal.     LABORATORY DATA:  I have reviewed the labs as listed.  CBC Latest Ref Rng & Units 08/21/2020 08/14/2020 08/07/2020  WBC 4.0 - 10.5 K/uL 9.7 10.2 11.1(H)  Hemoglobin 13.0 - 17.0 g/dL 9.2(L) 9.1(L) 9.3(L)  Hematocrit 39.0 - 52.0 % 28.6(L) 27.7(L) 28.2(L)  Platelets 150 - 400 K/uL 217 159 153   CMP Latest Ref Rng & Units 08/21/2020 08/14/2020 08/07/2020  Glucose 70 - 99 mg/dL 100(H) 94 95  BUN 8 - 23 mg/dL 23 23 24(H)  Creatinine 0.61 - 1.24 mg/dL 1.72(H) 1.33(H) 1.32(H)  Sodium 135 - 145 mmol/L 134(L) 134(L) 138  Potassium 3.5 - 5.1 mmol/L 4.1 3.9 3.9  Chloride 98 - 111 mmol/L 100 99 103  CO2 22 - 32 mmol/L _0 Calcium 8.9 - 10.3 mg/dL 7.7(L) 7.8(L) 7.9(L)  Total Protein 6.5 - 8.1 g/dL 4.8(L) 4.8(L) 4.8(L)  Total Bilirubin 0.3 - 1.2 mg/dL 0.5 0.6 0.5  Alkaline Phos 38 -  126 U/L 128(H) 108 112  AST 15 - 41 U/L 11(L) 10(L) 12(L)  ALT 0 - 44 U/L _1 DIAGNOSTIC IMAGING:  I have independently reviewed the scans and discussed with the patient. No results found.   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 Joylene Grapes at [facility]-SPEP 0.2 g, immunofixation with biclonal IgG with lambda specificity. -Free kappa light chain 16.7, lambda light chains 122.7, ratio 0.14. -Kidney biopsy on 02/04/2020 with AL amyloidosis, lambda immunophenotype, Congo red stain positive. Immunofluorescence microscopy shows dense homogeneous glomerular mesangial staining with antisera specific for IgG 1-2+, IgM 2+, C1q 2+, lambda light chains 3+. Waxy homogeneous staining noted along the blood vessels. Congo red stain demonstrates positive apocrine birefringence within the glomerular mesangial regions and along blood vessel walls. -48 pound weight loss since summer 2021 with decreased appetite. Positive for fatigue. -Reports occasional nausea, no vomiting. Positive for occipital headaches. -Denies any tingling or numbness in the extremities. He has cramps in the left arm which is new. He had leg cramps for a long time. -Cardiac MRI on 02/29/2020 with EF 38%, no evidence of  amyloid deposits in the myocardium. -PET scan on 02/26/2020 with scattered borderline mediastinal and hilar lymph nodes with low-level hypermetabolism most likely inflammatory or reactive. No bone abnormalities. No findings in the abdomen or pelvis. -Bone marrow biopsy on 02/26/2020 with slightly hypercellular bone marrow with 23% cells consistent with plasma cells, staining for lambda light chain. No lymphoproliferative disorder. Congo red stain was negative. -Bone marrow cytogenetics were normal. FISH panel for multiple myeloma was negative. -M spike was 0.1 g, immunofixation shows  IgG lambda. LDH was elevated at 216. Beta-2 microglobulin 4.5. -Dara-CyBorD started on 03/20/2020.  2. Social/family history: -He works for [employer] and retired 5 years ago. Never smoker. -Maternal aunt had cancer in male organs. One maternal first cousin had appendiceal cancer, another maternal first cousin had cancer of the digestive system.   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 - Creatinine has improved to 1.56 from 1.72.  Changes in the diuretics were made. - Labs from 08/14/2020 shows M spike 0.1 g.  Free light chain ratio improved to 0.39.  Immunofixation shows IgG kappa and IgG lambda. - 24-hour urine shows total protein improved to 13.4 g. - He will proceed with his treatment today as day 15 of cycle 6. - He will come back next week for day 22.  He will repeat SPEP, free light chains and 24-hour urine next week. - He has an appointment to see Dr. Quentin Ore next Tuesday. - His wife Enid Derry reportedly diagnosed with recurrent breast cancer.  We will hold off on his next treatment until 09/18/2020.  2. Nausea: -Continue Compazine as needed.  3.Leg weakness/pains: -Left sural nerve biopsy on 07/02/2020 [facility] did not show any evidence of amyloidosis.  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 - She will continue tramadol and hydrocodone as needed for moderate to severe pain in the legs.  4. Sleeping difficulty: -He is not on any sleep medication at this time.  5. Loss of appetite: -He is eating better and is not requiring Megace.  6. Lower extremity swelling: -He is using compression stockings. - Dr. Alroy Dust has made some changes to his diuretics.  Bumex was discontinued. - He was started on torsemide 20 mg twice daily along with spironolactone 25 mg daily. - His wife reports that his ejection fraction is around 20%.   We will obtain echocardiogram reports. - He is also placed on Entresto twice daily.   Orders placed this encounter:  No orders of the defined types were placed in this encounter.    Derek Jack, MD [place] [phone]   I, Thana Ates, am acting as a scribe for Dr. Derek Jack.  I, Derek Jack MD, have reviewed the above documentation for accuracy and completeness, and I agree with the above.

## 2020-08-28 ENCOUNTER — Encounter (HOSPITAL_COMMUNITY): Payer: Self-pay

## 2020-08-28 ENCOUNTER — Inpatient Hospital Stay (HOSPITAL_COMMUNITY): Payer: Medicare Other

## 2020-08-28 ENCOUNTER — Other Ambulatory Visit (HOSPITAL_COMMUNITY): Payer: Medicare Other

## 2020-08-28 ENCOUNTER — Inpatient Hospital Stay (HOSPITAL_BASED_OUTPATIENT_CLINIC_OR_DEPARTMENT_OTHER): Payer: Medicare Other | Admitting: Hematology

## 2020-08-28 ENCOUNTER — Other Ambulatory Visit: Payer: Self-pay

## 2020-08-28 VITALS — BP 110/74 | HR 79 | Temp 96.1°F | Resp 18

## 2020-08-28 DIAGNOSIS — Z5111 Encounter for antineoplastic chemotherapy: Secondary | ICD-10-CM | POA: Diagnosis not present

## 2020-08-28 DIAGNOSIS — C9000 Multiple myeloma not having achieved remission: Secondary | ICD-10-CM

## 2020-08-28 DIAGNOSIS — E8581 Light chain (AL) amyloidosis: Secondary | ICD-10-CM | POA: Diagnosis not present

## 2020-08-28 LAB — CBC WITH DIFFERENTIAL/PLATELET
Abs Immature Granulocytes: 0.12 10*3/uL — ABNORMAL HIGH (ref 0.00–0.07)
Basophils Absolute: 0.1 10*3/uL (ref 0.0–0.1)
Basophils Relative: 1 %
Eosinophils Absolute: 0.4 10*3/uL (ref 0.0–0.5)
Eosinophils Relative: 4 %
HCT: 31.1 % — ABNORMAL LOW (ref 39.0–52.0)
Hemoglobin: 10 g/dL — ABNORMAL LOW (ref 13.0–17.0)
Immature Granulocytes: 1 %
Lymphocytes Relative: 13 %
Lymphs Abs: 1.3 10*3/uL (ref 0.7–4.0)
MCH: 34 pg (ref 26.0–34.0)
MCHC: 32.2 g/dL (ref 30.0–36.0)
MCV: 105.8 fL — ABNORMAL HIGH (ref 80.0–100.0)
Monocytes Absolute: 0.8 10*3/uL (ref 0.1–1.0)
Monocytes Relative: 7 %
Neutro Abs: 7.9 10*3/uL — ABNORMAL HIGH (ref 1.7–7.7)
Neutrophils Relative %: 74 %
Platelets: 183 10*3/uL (ref 150–400)
RBC: 2.94 MIL/uL — ABNORMAL LOW (ref 4.22–5.81)
RDW: 16.1 % — ABNORMAL HIGH (ref 11.5–15.5)
WBC: 10.6 10*3/uL — ABNORMAL HIGH (ref 4.0–10.5)
nRBC: 0 % (ref 0.0–0.2)

## 2020-08-28 LAB — COMPREHENSIVE METABOLIC PANEL
ALT: 10 U/L (ref 0–44)
AST: 11 U/L — ABNORMAL LOW (ref 15–41)
Albumin: 1.9 g/dL — ABNORMAL LOW (ref 3.5–5.0)
Alkaline Phosphatase: 127 U/L — ABNORMAL HIGH (ref 38–126)
Anion gap: 7 (ref 5–15)
BUN: 23 mg/dL (ref 8–23)
CO2: 29 mmol/L (ref 22–32)
Calcium: 8 mg/dL — ABNORMAL LOW (ref 8.9–10.3)
Chloride: 102 mmol/L (ref 98–111)
Creatinine, Ser: 1.56 mg/dL — ABNORMAL HIGH (ref 0.61–1.24)
GFR, Estimated: 47 mL/min — ABNORMAL LOW (ref >60)
Glucose, Bld: 104 mg/dL — ABNORMAL HIGH (ref 70–99)
Potassium: 3.8 mmol/L (ref 3.5–5.1)
Sodium: 138 mmol/L (ref 135–145)
Total Bilirubin: 0.4 mg/dL (ref 0.3–1.2)
Total Protein: 4.9 g/dL — ABNORMAL LOW (ref 6.5–8.1)

## 2020-08-28 LAB — MAGNESIUM: Magnesium: 2 mg/dL (ref 1.7–2.4)

## 2020-08-28 MED ORDER — SODIUM CHLORIDE 0.9% FLUSH
10.0000 mL | Freq: Once | INTRAVENOUS | Status: AC
  Administered 2020-08-28: 10 mL via INTRAVENOUS

## 2020-08-28 MED ORDER — HEPARIN SOD (PORK) LOCK FLUSH 100 UNIT/ML IV SOLN
500.0000 [IU] | Freq: Once | INTRAVENOUS | Status: AC
Start: 2020-08-28 — End: 2020-08-28
  Administered 2020-08-28: 500 [IU] via INTRAVENOUS

## 2020-08-28 MED ORDER — PALONOSETRON HCL INJECTION 0.25 MG/5ML
0.2500 mg | Freq: Once | INTRAVENOUS | Status: AC
  Administered 2020-08-28: 0.25 mg via INTRAVENOUS
  Filled 2020-08-28: qty 5

## 2020-08-28 MED ORDER — SODIUM CHLORIDE 0.9 % IV SOLN
10.0000 mg | Freq: Once | INTRAVENOUS | Status: AC
  Administered 2020-08-28: 10 mg via INTRAVENOUS
  Filled 2020-08-28: qty 10

## 2020-08-28 MED ORDER — SODIUM CHLORIDE 0.9 % IV SOLN: Freq: Once | INTRAVENOUS | Status: AC

## 2020-08-28 MED ORDER — SODIUM CHLORIDE 0.9 % IV SOLN
500.0000 mg | Freq: Once | INTRAVENOUS | Status: AC
  Administered 2020-08-28: 500 mg via INTRAVENOUS
  Filled 2020-08-28: qty 25

## 2020-08-28 MED ORDER — BORTEZOMIB CHEMO SQ INJECTION 3.5 MG (2.5MG/ML)
1.3000 mg/m2 | Freq: Once | INTRAMUSCULAR | Status: AC
  Administered 2020-08-28: 2.75 mg via SUBCUTANEOUS
  Filled 2020-08-28: qty 1.1

## 2020-08-28 NOTE — Progress Notes (Signed)
Maintain cyclophosphamide dose at 500 mg per Dr Delton Coombes.  T.O. Dr Guadlupe Spanish, RN/Reatha Sur Ronnald Ramp, PharmD

## 2020-08-28 NOTE — Progress Notes (Signed)
Patient has been assessed, vital signs and labs have been reviewed by Dr. Katragadda. ANC, Creatinine, LFTs, and Platelets are within treatment parameters per Dr. Katragadda. The patient is good to proceed with treatment at this time. Primary RN and pharmacy aware.  

## 2020-08-28 NOTE — Patient Instructions (Addendum)
[place] at [facility] Discharge Instructions  You were seen today by Dr. Delton Coombes. He went over your recent results, and you received your treatment. Dr. Delton Coombes will see you back in on 09/18/2020 for labs and follow up.   Thank you for choosing [place] at [facility] to provide your oncology and hematology care.  To afford each patient quality time with our provider, please arrive at least 15 minutes before your scheduled appointment time.   If you have a lab appointment with the [facility] please come in thru the Main Entrance and check in at the main information desk  You need to re-schedule your appointment should you arrive 10 or more minutes late.  We strive to give you quality time with our providers, and arriving late affects you and other patients whose appointments are after yours.  Also, if you no show three or more times for appointments you may be dismissed from the clinic at the providers discretion.     Again, thank you for choosing [facility].  Our hope is that these requests will decrease the amount of time that you wait before being seen by our physicians.       _____________________________________________________________  Should you have questions after your visit to [facility], please contact our office at [phone] between the hours of 8:00 a.m. and 4:30 p.m.  Voicemails left after 4:00 p.m. will not be returned until the following business day.  For prescription refill requests, have your pharmacy contact our office and allow 72 hours.    Cancer Center Support Programs:   > Cancer Support Group  2nd Tuesday of the month 1pm-2pm, Journey Room

## 2020-08-28 NOTE — Progress Notes (Signed)
Patient tolerated chemotherapy with no complaints voiced. Side effects with management reviewed understanding verbalized. Port site clean and dry with no bruising or swelling noted at site. Good blood return noted before and after administration of chemotherapy. No Band aid applied per patient request.   Patient tolerated Velcade injection with no complaints voiced. Lab work reviewed. See MAR for details. Injection site clean and dry with no bruising or swelling noted. Patient stable during and after injection. Band aid applied. VSS. Patient left in satisfactory condition with no s/s of distress noted.

## 2020-08-28 NOTE — Patient Instructions (Signed)
[place]  Discharge Instructions: Thank you for choosing [facility] to provide your oncology and hematology care.  If you have a lab appointment with the [facility], please come in thru the Main Entrance and check in at the main information desk.  Wear comfortable clothing and clothing appropriate for easy access to any Portacath or PICC line.   We strive to give you quality time with your provider. You may need to reschedule your appointment if you arrive late (15 or more minutes).  Arriving late affects you and other patients whose appointments are after yours.  Also, if you miss three or more appointments without notifying the office, you may be dismissed from the clinic at the provider's discretion.      For prescription refill requests, have your pharmacy contact our office and allow 72 hours for refills to be completed.    Today you received the following chemotherapy and/or immunotherapy agents Cytoxan and Velcade.   To help prevent nausea and vomiting after your treatment, we encourage you to take your nausea medication as directed.  BELOW ARE SYMPTOMS THAT SHOULD BE REPORTED IMMEDIATELY: . *FEVER GREATER THAN 100.4 F (38 C) OR HIGHER . *CHILLS OR SWEATING . *NAUSEA AND VOMITING THAT IS NOT CONTROLLED WITH YOUR NAUSEA MEDICATION . *UNUSUAL SHORTNESS OF BREATH . *UNUSUAL BRUISING OR BLEEDING . *URINARY PROBLEMS (pain or burning when urinating, or frequent urination) . *BOWEL PROBLEMS (unusual diarrhea, constipation, pain near the anus) . TENDERNESS IN MOUTH AND THROAT WITH OR WITHOUT PRESENCE OF ULCERS (sore throat, sores in mouth, or a toothache) . UNUSUAL RASH, SWELLING OR PAIN  . UNUSUAL VAGINAL DISCHARGE OR ITCHING   Items with * indicate a potential emergency and should be followed up as soon as possible or go to the Emergency Department if any problems should occur.  Please show the CHEMOTHERAPY ALERT CARD or IMMUNOTHERAPY ALERT CARD at  check-in to the Emergency Department and triage nurse.  Should you have questions after your visit or need to cancel or reschedule your appointment, please contact [facility] [phone]  and follow the prompts.  Office hours are 8:00 a.m. to 4:30 p.m. Monday - Friday. Please note that voicemails left after 4:00 p.m. may not be returned until the following business day.  We are closed weekends and major holidays. You have access to a nurse at all times for urgent questions. Please call the main number to the clinic [phone] and follow the prompts.  For any non-urgent questions, you may also contact your provider using MyChart. We now offer e-Visits for anyone 57 and older to request care online for non-urgent symptoms. For details visit [email or web address].   Also download the MyChart app! Go to the app store, search "MyChart", open the app, select [place], and log in with your MyChart username and password.  Due to Covid, a mask is required upon entering the hospital/clinic. If you do not have a mask, one will be given to you upon arrival. For doctor visits, patients may have 1 support person aged 40 or older with them. For treatment visits, patients cannot have anyone with them due to current Covid guidelines and our immunocompromised population.

## 2020-08-29 ENCOUNTER — Encounter (HOSPITAL_COMMUNITY): Payer: Self-pay | Admitting: Hematology and Oncology

## 2020-09-04 ENCOUNTER — Encounter (HOSPITAL_COMMUNITY): Payer: Self-pay

## 2020-09-04 ENCOUNTER — Inpatient Hospital Stay (HOSPITAL_COMMUNITY): Payer: Medicare Other | Attending: Hematology

## 2020-09-04 ENCOUNTER — Ambulatory Visit (HOSPITAL_COMMUNITY): Payer: Medicare Other | Admitting: Hematology

## 2020-09-04 ENCOUNTER — Inpatient Hospital Stay (HOSPITAL_COMMUNITY): Payer: Medicare Other

## 2020-09-04 ENCOUNTER — Other Ambulatory Visit (HOSPITAL_COMMUNITY): Payer: Self-pay

## 2020-09-04 ENCOUNTER — Other Ambulatory Visit: Payer: Self-pay

## 2020-09-04 ENCOUNTER — Other Ambulatory Visit (HOSPITAL_COMMUNITY): Payer: Self-pay | Admitting: *Deleted

## 2020-09-04 VITALS — BP 113/71 | HR 84 | Temp 97.7°F | Resp 18

## 2020-09-04 DIAGNOSIS — C9000 Multiple myeloma not having achieved remission: Secondary | ICD-10-CM | POA: Insufficient documentation

## 2020-09-04 DIAGNOSIS — I2510 Atherosclerotic heart disease of native coronary artery without angina pectoris: Secondary | ICD-10-CM | POA: Diagnosis not present

## 2020-09-04 DIAGNOSIS — K219 Gastro-esophageal reflux disease without esophagitis: Secondary | ICD-10-CM | POA: Insufficient documentation

## 2020-09-04 DIAGNOSIS — I10 Essential (primary) hypertension: Secondary | ICD-10-CM | POA: Diagnosis not present

## 2020-09-04 DIAGNOSIS — E8581 Light chain (AL) amyloidosis: Secondary | ICD-10-CM | POA: Insufficient documentation

## 2020-09-04 DIAGNOSIS — I739 Peripheral vascular disease, unspecified: Secondary | ICD-10-CM | POA: Insufficient documentation

## 2020-09-04 DIAGNOSIS — I252 Old myocardial infarction: Secondary | ICD-10-CM | POA: Insufficient documentation

## 2020-09-04 DIAGNOSIS — Z8673 Personal history of transient ischemic attack (TIA), and cerebral infarction without residual deficits: Secondary | ICD-10-CM | POA: Diagnosis not present

## 2020-09-04 DIAGNOSIS — Z5111 Encounter for antineoplastic chemotherapy: Secondary | ICD-10-CM | POA: Diagnosis not present

## 2020-09-04 LAB — CBC WITH DIFFERENTIAL/PLATELET
Abs Immature Granulocytes: 0.06 10*3/uL (ref 0.00–0.07)
Basophils Absolute: 0 10*3/uL (ref 0.0–0.1)
Basophils Relative: 0 %
Eosinophils Absolute: 0.4 10*3/uL (ref 0.0–0.5)
Eosinophils Relative: 4 %
HCT: 33.3 % — ABNORMAL LOW (ref 39.0–52.0)
Hemoglobin: 10.6 g/dL — ABNORMAL LOW (ref 13.0–17.0)
Immature Granulocytes: 1 %
Lymphocytes Relative: 12 %
Lymphs Abs: 1.3 10*3/uL (ref 0.7–4.0)
MCH: 34 pg (ref 26.0–34.0)
MCHC: 31.8 g/dL (ref 30.0–36.0)
MCV: 106.7 fL — ABNORMAL HIGH (ref 80.0–100.0)
Monocytes Absolute: 0.7 10*3/uL (ref 0.1–1.0)
Monocytes Relative: 6 %
Neutro Abs: 8.6 10*3/uL — ABNORMAL HIGH (ref 1.7–7.7)
Neutrophils Relative %: 77 %
Platelets: 171 10*3/uL (ref 150–400)
RBC: 3.12 MIL/uL — ABNORMAL LOW (ref 4.22–5.81)
RDW: 16.3 % — ABNORMAL HIGH (ref 11.5–15.5)
WBC: 11 10*3/uL — ABNORMAL HIGH (ref 4.0–10.5)
nRBC: 0 % (ref 0.0–0.2)

## 2020-09-04 LAB — COMPREHENSIVE METABOLIC PANEL
ALT: 9 U/L (ref 0–44)
AST: 11 U/L — ABNORMAL LOW (ref 15–41)
Albumin: 2 g/dL — ABNORMAL LOW (ref 3.5–5.0)
Alkaline Phosphatase: 110 U/L (ref 38–126)
Anion gap: 6 (ref 5–15)
BUN: 18 mg/dL (ref 8–23)
CO2: 30 mmol/L (ref 22–32)
Calcium: 8.1 mg/dL — ABNORMAL LOW (ref 8.9–10.3)
Chloride: 101 mmol/L (ref 98–111)
Creatinine, Ser: 1.29 mg/dL — ABNORMAL HIGH (ref 0.61–1.24)
GFR, Estimated: 60 mL/min — ABNORMAL LOW (ref >60)
Glucose, Bld: 93 mg/dL (ref 70–99)
Potassium: 4 mmol/L (ref 3.5–5.1)
Sodium: 137 mmol/L (ref 135–145)
Total Bilirubin: 0.5 mg/dL (ref 0.3–1.2)
Total Protein: 4.6 g/dL — ABNORMAL LOW (ref 6.5–8.1)

## 2020-09-04 LAB — PROTEIN, URINE, 24 HOUR
Collection Interval-UPROT: 24 hours
Protein, 24H Urine: 18163 mg/d — ABNORMAL HIGH (ref 50–100)
Protein, Urine: 1191 mg/dL
Urine Total Volume-UPROT: 1525 mL

## 2020-09-04 LAB — MAGNESIUM: Magnesium: 1.8 mg/dL (ref 1.7–2.4)

## 2020-09-04 MED ORDER — DEXAMETHASONE SODIUM PHOSPHATE 100 MG/10ML IJ SOLN
10.0000 mg | Freq: Once | INTRAMUSCULAR | Status: AC
  Administered 2020-09-04: 10 mg via INTRAVENOUS
  Filled 2020-09-04: qty 10

## 2020-09-04 MED ORDER — HYDROCODONE-ACETAMINOPHEN 5-325 MG PO TABS: 1.0000 | ORAL_TABLET | Freq: Three times a day (TID) | ORAL | 0 refills | Status: DC | PRN

## 2020-09-04 MED ORDER — ACETAMINOPHEN 325 MG PO TABS
650.0000 mg | ORAL_TABLET | Freq: Once | ORAL | Status: AC
  Administered 2020-09-04: 650 mg via ORAL
  Filled 2020-09-04: qty 2

## 2020-09-04 MED ORDER — DIAZEPAM 2 MG PO TABS: 2.0000 mg | ORAL_TABLET | Freq: Every day | ORAL | 0 refills | Status: DC | PRN

## 2020-09-04 MED ORDER — DEXAMETHASONE 4 MG PO TABS: 20.0000 mg | ORAL_TABLET | Freq: Once | ORAL | Status: DC

## 2020-09-04 MED ORDER — MAGNESIUM OXIDE 400 MG PO TABS: 400.0000 mg | ORAL_TABLET | Freq: Every day | ORAL | 3 refills | Status: DC

## 2020-09-04 MED ORDER — TRAMADOL HCL 50 MG PO TABS: ORAL_TABLET | ORAL | 0 refills | Status: DC

## 2020-09-04 MED ORDER — ALPRAZOLAM 0.25 MG PO TABS: 0.2500 mg | ORAL_TABLET | Freq: Four times a day (QID) | ORAL | 0 refills | Status: DC

## 2020-09-04 MED ORDER — DIPHENHYDRAMINE HCL 25 MG PO CAPS
50.0000 mg | ORAL_CAPSULE | Freq: Once | ORAL | Status: AC
  Administered 2020-09-04: 50 mg via ORAL
  Filled 2020-09-04: qty 2

## 2020-09-04 MED ORDER — DARATUMUMAB-HYALURONIDASE-FIHJ 1800-30000 MG-UT/15ML ~~LOC~~ SOLN
1800.0000 mg | Freq: Once | SUBCUTANEOUS | Status: AC
  Administered 2020-09-04: 1800 mg via SUBCUTANEOUS
  Filled 2020-09-04: qty 15

## 2020-09-04 MED ORDER — SODIUM CHLORIDE 0.9 % IV SOLN: Freq: Once | INTRAVENOUS | Status: AC

## 2020-09-04 MED ORDER — HEPARIN SOD (PORK) LOCK FLUSH 100 UNIT/ML IV SOLN
500.0000 [IU] | Freq: Once | INTRAVENOUS | Status: AC
  Administered 2020-09-04: 500 [IU] via INTRAVENOUS

## 2020-09-04 MED ORDER — SODIUM CHLORIDE 0.9% FLUSH
10.0000 mL | Freq: Once | INTRAVENOUS | Status: AC
  Administered 2020-09-04: 10 mL

## 2020-09-04 MED ORDER — PROCHLORPERAZINE MALEATE 10 MG PO TABS: 10.0000 mg | ORAL_TABLET | Freq: Four times a day (QID) | ORAL | 3 refills | Status: AC | PRN

## 2020-09-04 MED ORDER — BORTEZOMIB CHEMO SQ INJECTION 3.5 MG (2.5MG/ML)
1.3000 mg/m2 | Freq: Once | INTRAMUSCULAR | Status: AC
  Administered 2020-09-04: 2.75 mg via SUBCUTANEOUS
  Filled 2020-09-04: qty 1.1

## 2020-09-04 MED ORDER — SODIUM CHLORIDE 0.9 % IV SOLN
500.0000 mg | Freq: Once | INTRAVENOUS | Status: AC
  Administered 2020-09-04: 500 mg via INTRAVENOUS
  Filled 2020-09-04: qty 25

## 2020-09-04 NOTE — Progress Notes (Signed)
Maintain cyclophosphamide at 500 mg per Dr Delton Coombes  T.O. Dr Rhys Martini, PharmD

## 2020-09-04 NOTE — Patient Instructions (Signed)
[facility]  Discharge Instructions: Thank you for choosing [facility] to provide your oncology and hematology care.  If you have a lab appointment with the [facility], please come in thru the Main Entrance and check in at the main information desk.  Wear comfortable clothing and clothing appropriate for easy access to any Portacath or PICC line.   We strive to give you quality time with your provider. You may need to reschedule your appointment if you arrive late (15 or more minutes).  Arriving late affects you and other patients whose appointments are after yours.  Also, if you miss three or more appointments without notifying the office, you may be dismissed from the clinic at the provider's discretion.      For prescription refill requests, have your pharmacy contact our office and allow 72 hours for refills to be completed.    Today you received the following chemotherapy and/or immunotherapy agents Cytoxan, Velcade, Darzalex Faspro [place].    To help prevent nausea and vomiting after your treatment, we encourage you to take your nausea medication as directed.  BELOW ARE SYMPTOMS THAT SHOULD BE REPORTED IMMEDIATELY: . *FEVER GREATER THAN 100.4 F (38 C) OR HIGHER . *CHILLS OR SWEATING . *NAUSEA AND VOMITING THAT IS NOT CONTROLLED WITH YOUR NAUSEA MEDICATION . *UNUSUAL SHORTNESS OF BREATH . *UNUSUAL BRUISING OR BLEEDING . *URINARY PROBLEMS (pain or burning when urinating, or frequent urination) . *BOWEL PROBLEMS (unusual diarrhea, constipation, pain near the anus) . TENDERNESS IN MOUTH AND THROAT WITH OR WITHOUT PRESENCE OF ULCERS (sore throat, sores in mouth, or a toothache) . UNUSUAL RASH, SWELLING OR PAIN  . UNUSUAL VAGINAL DISCHARGE OR ITCHING   Items with * indicate a potential emergency and should be followed up as soon as possible or go to the Emergency Department if any problems should occur.  Please show the CHEMOTHERAPY ALERT CARD or IMMUNOTHERAPY  ALERT CARD at check-in to the Emergency Department and triage nurse.  Should you have questions after your visit or need to cancel or reschedule your appointment, please contact [facility] [phone]  and follow the prompts.  Office hours are 8:00 a.m. to 4:30 p.m. Monday - Friday. Please note that voicemails left after 4:00 p.m. may not be returned until the following business day.  We are closed weekends and major holidays. You have access to a nurse at all times for urgent questions. Please call the main number to the clinic [phone] and follow the prompts.  For any non-urgent questions, you may also contact your provider using MyChart. We now offer e-Visits for anyone 44 and older to request care online for non-urgent symptoms. For details visit [email or web address].   Also download the MyChart app! Go to the app store, search "MyChart", open the app, select [place], and log in with your MyChart username and password.  Due to Covid, a mask is required upon entering the hospital/clinic. If you do not have a mask, one will be given to you upon arrival. For doctor visits, patients may have 1 support person aged 57 or older with them. For treatment visits, patients cannot have anyone with them due to current Covid guidelines and our immunocompromised population.

## 2020-09-04 NOTE — Progress Notes (Signed)
Patient presents today for treatment . Vital sign within parameters for treatment. Labs within parameters for treatment. Patient's wife at the bedside. Plan of care discussed. Patient requests refills from his PCP medications due to their PCP left [place] per pt's words. Message sent to Dr. Delton Coombes. TMyers RN, Surveyor, quantity, MEdwards RN notified.   Spoke with Dr. Delton Coombes . Verbal order received to proceed with today's treatment. Patient to received Velcade, Cytoxan and Darzalex Faspro [place] today.   Treatment given today per MD orders. Tolerated infusion without adverse affects. Vital signs stable. No complaints at this time. Discharged from clinic ambulatory in stable condition. Alert and oriented x 3. F/U with [facility] as scheduled.

## 2020-09-05 LAB — PROTEIN ELECTROPHORESIS, SERUM
A/G Ratio: 0.9 (ref 0.7–1.7)
Albumin ELP: 2.1 g/dL — ABNORMAL LOW (ref 2.9–4.4)
Alpha-1-Globulin: 0.2 g/dL (ref 0.0–0.4)
Alpha-2-Globulin: 1.3 g/dL — ABNORMAL HIGH (ref 0.4–1.0)
Beta Globulin: 0.7 g/dL (ref 0.7–1.3)
Gamma Globulin: 0.3 g/dL — ABNORMAL LOW (ref 0.4–1.8)
Globulin, Total: 2.4 g/dL (ref 2.2–3.9)
M-Spike, %: 0.1 g/dL — ABNORMAL HIGH
Total Protein ELP: 4.5 g/dL — ABNORMAL LOW (ref 6.0–8.5)

## 2020-09-05 LAB — KAPPA/LAMBDA LIGHT CHAINS, FREE, WITH RATIO, 24HR. URINE
FR KAPPA LT CH,24HR: 105.62 mg/24 hr
FR LAMBDA LT CH,24HR: 208.77 mg/24 hr
Free Kappa Lt Chains,Ur: 69.26 mg/L (ref 1.17–86.46)
Free Kappa/Lambda Ratio: 0.51 — ABNORMAL LOW (ref 1.83–14.26)
Free Lambda Lt Chains,Ur: 136.9 mg/L — ABNORMAL HIGH (ref 0.27–15.21)
Total Volume: 1525

## 2020-09-05 LAB — KAPPA/LAMBDA LIGHT CHAINS
Kappa free light chain: 12.1 mg/L (ref 3.3–19.4)
Kappa, lambda light chain ratio: 0.42 (ref 0.26–1.65)
Lambda free light chains: 28.7 mg/L — ABNORMAL HIGH (ref 5.7–26.3)

## 2020-09-09 LAB — UPEP/UIFE/LIGHT CHAINS/TP, 24-HR UR
% BETA, Urine: 9.7 %
ALPHA 1 URINE: 9.2 %
Albumin, U: 72.9 %
Alpha 2, Urine: 6.9 %
Free Kappa Lt Chains,Ur: 68.12 mg/L (ref 1.17–86.46)
Free Kappa/Lambda Ratio: 0.54 — ABNORMAL LOW (ref 1.83–14.26)
Free Lambda Lt Chains,Ur: 126.39 mg/L — ABNORMAL HIGH (ref 0.27–15.21)
GAMMA GLOBULIN URINE: 1.2 %
Total Protein, Urine-Ur/day: 17388 mg/24 hr — ABNORMAL HIGH (ref 30–150)
Total Protein, Urine: 1140.2 mg/dL
Total Volume: 1525

## 2020-09-17 NOTE — Progress Notes (Signed)
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  PRIOR THERAPY: none  NGS Results: not done  CURRENT THERAPY: DaraCyBorD & Aloxi weekly  BRIEF ONCOLOGIC HISTORY:  Oncology History  Multiple myeloma ([facility])  03/06/2020 Initial Diagnosis   Multiple myeloma ([place])    03/06/2020 Cancer Staging   Staging form: Plasma Cell Myeloma and Plasma Cell Disorders, AJCC 8th Edition - Clinical stage from 03/06/2020: RISS Stage II (Beta-2-microglobulin (mg/L): 4.5, Albumin (g/dL): 2.5, ISS: Stage II, High-risk cytogenetics: Absent, LDH: Elevated) - Signed by Julian Jack, MD on 03/06/2020    03/20/2020 - 03/20/2020 Chemotherapy   The patient had dexamethasone (DECADRON) tablet 40 mg, 40 mg, Oral,  Once, 0 of 4 cycles bortezomib SQ (VELCADE) chemo injection (2.100m/mL concentration) 2.75 mg, 1.3 mg/m2 = 2.75 mg, Subcutaneous,  Once, 0 of 4 cycles   for chemotherapy treatment.     03/20/2020 -  Chemotherapy    Patient is on Treatment Plan: PRIMARY AMYLOIDOSIS DARACYBORD (DARATUMUMAB SQ + CYCLOPHOSPHAMIDE PO + BORTEZOMIB SQ + DEXAMETHASONE PO/IV) Q28D X 6 CYCLES / DARATUMUMAB SQ Q28D         CANCER STAGING: Cancer Staging Multiple myeloma ([place] Staging form: Plasma Cell Myeloma and Plasma Cell Disorders, AJCC 8th Edition - Clinical stage from 03/06/2020: RISS Stage II (Beta-2-microglobulin (mg/L): 4.5, Albumin (g/dL): 2.5, ISS: Stage II, High-risk cytogenetics: Absent, LDH: Elevated) - Signed by KDerek Jack MD on 03/06/2020   INTERVAL HISTORY:  Julian Miranda a 70y.o. male, returns for routine follow-up and consideration for next cycle of chemotherapy. TKarthikwas last seen on 08/28/2020.  Due for cycle #8 of DaraCyBorD & Aloxi today.   Overall, he tells me he has been  feeling pretty well. He reports that he is feeling stronger. He reports occasional but infrequent leg pain, and he denies nausea. His appetite and energy levels are improved, and he is still walking assisted by a cane. He has been able to increase physical activities at home. He denies any tightness in his abdomen or tingling and numbness.  Overall, he feels ready for next cycle of chemo today.   REVIEW OF SYSTEMS:  Review of Systems  Constitutional:  Positive for appetite change (75%) and fatigue (75%).  All other systems reviewed and are negative.  PAST MEDICAL/SURGICAL HISTORY:  Past Medical History:  Diagnosis Date   CAD (coronary artery disease)    GERD (gastroesophageal reflux disease)    Hypertension    [facility]ocardial infarction [facility]   Peripheral vascular disease ([facility]    [place] ([facility]    Past Surgical History:  Procedure Laterality Date   BYPASS GRAFT POPLITEAL TO POPLITEAL Right 03/23/2018   Procedure: BYPASS GRAFT RIGHT ABOVE KNEE POPLITEAL TO BELOW KNEE POPLITEAL ARTERY  USING RIGHT GREAT SAPHENOUS VEIN;  Surgeon: CMarty Heck MD;  [facility]  Service: Vascular;  Laterality: Right;   BYPASS GRAFT POPLITEAL TO POPLITEAL Left 04/09/2019   Procedure: BYPASS  ABOVE KNEE POPLITEAL TO BELOW KNEE POPLITEAL AND LIGATION OF LEFT POPITEAL ANEURYSM;  Surgeon: CMarty Heck MD;  [facility]  Service: Vascular;  Laterality: Left;   COLONOSCOPY WITH ESOPHAGOGASTRODUODENOSCOPY (EGD)     CORONARY ARTERY BYPASS GRAFT     EYE SURGERY Bilateral    cataracts   FEMORAL BYPASS Right 03/23/2018   HERNIA  REPAIR     IR IMAGING GUIDED PORT INSERTION  03/25/2020   MASTOIDECTOMY     ROTATOR CUFF REPAIR Right    TONSILLECTOMY     VEIN HARVEST Right 03/23/2018   Procedure: VEIN HARVEST RIGHT GREAT SAPHENOUS;  Surgeon: Marty Heck, MD;  Location[facility];  Service: Vascular;  Laterality: Right;   VEIN HARVEST Left 04/09/2019   Procedure: Harvest Greater Saphenous  Vein and  Revise Elisa Lateral Saphenous Vein ;  Surgeon: Marty Heck, MD;  Location: [facility] OR;  Service: Vascular;  Laterality: Left;    SOCIAL HISTORY:  Social History   Socioeconomic History   Marital status: Married    Spouse name: Not on file   Number of children: Not on file   Years of education: Not on file   Highest education level: Not on file  Occupational History   Not on file  Tobacco Use   Smoking status: Never   Smokeless tobacco: Never  Vaping Use   Vaping Use: Never used  Substance and Sexual Activity   Alcohol use: Never   Drug use: Never   Sexual activity: Not on file  Other Topics Concern   Not on file  Social History Narrative   Not on file   Social Determinants of Health   Financial Resource Strain: Low Risk    Difficulty of Paying Living Expenses: Not hard at all  Food Insecurity: No Food Insecurity   Worried About Running Out of Food in the Last Year: Never true   [place] in the Last Year: Never true  Transportation Needs: No Transportation Needs   Lack of Transportation (Medical): No   Lack of Transportation (Non-Medical): No  Physical Activity: Insufficiently Active   Days of Exercise per Week: 2 days   Minutes of Exercise per Session: 20 min  Stress: Stress Concern Present   Feeling of Stress : To some extent  Social Connections: Moderately Integrated   Frequency of Communication with Friends and Family: More than three times a week   Frequency of Social Gatherings with Friends and Family: Twice a week   Attends Religious Services: More than 4 times per year   Active Member of [employer] or Organizations: No   Attends Music therapist: Never   Marital Status: Married  Human resources officer Violence: Not At Risk   Fear of Current or Ex-Partner: No   Emotionally Abused: No   Physically Abused: No   Sexually Abused: No    FAMILY HISTORY:  Family History  Problem Relation Age of Onset   Heart disease Mother      CURRENT MEDICATIONS:  Current Outpatient Medications  Medication Sig Dispense Refill   acetaminophen (TYLENOL) 500 MG tablet Take 500 mg by mouth 2 (two) times daily.     acyclovir (ZOVIRAX) 400 MG tablet Take 1 tablet (400 mg total) by mouth 2 (two) times daily. 60 tablet 5   ALPRAZolam (XANAX) 0.25 MG tablet Take 1 tablet (0.25 mg total) by mouth in the morning, at noon, in the evening, and at bedtime. 120 tablet 0   aspirin 81 MG tablet Take 81 mg by mouth daily.     atorvastatin (LIPITOR) 80 MG tablet Take 80 mg by mouth at bedtime.      bumetanide (BUMEX) 1 MG tablet Take 1 tablet (1 mg total) by mouth daily. Take 1 tablet in the am and 1/2 tablet in pm.  Do not give if top BP number less than 100. 60  tablet 3   carvedilol (COREG) 25 MG tablet Take 25 mg by mouth daily.      chlorproMAZINE (THORAZINE) 25 MG tablet Take 1 tablet (25 mg total) by mouth 4 (four) times daily as needed. For hiccups 60 tablet 2   diazepam (VALIUM) 2 MG tablet Take 1 tablet (2 mg total) by mouth daily as needed (dizziness). 30 tablet 0   ergocalciferol (VITAMIN D2) 1.25 MG (50000 UT) capsule Take 1 capsule (50,000 Units total) by mouth once a week. 8 capsule 3   gabapentin (NEURONTIN) 300 MG capsule Take 1 capsule (300 mg total) by mouth daily. 30 capsule 6   HYDROcodone-acetaminophen (NORCO) 5-325 MG tablet Take 1 tablet by mouth every 8 (eight) hours as needed for moderate pain. 60 tablet 0   icosapent Ethyl (VASCEPA) 1 g capsule Take 1 g by mouth 2 (two) times daily.     Iron-Vitamin C (VITRON-C) 65-125 MG TABS Take 1 tablet by mouth daily. 60 tablet 3   lidocaine (XYLOCAINE) 2 % solution Use as directed 15 mLs in the mouth or throat as needed for mouth pain. 15 mL 3   magnesium oxide (MAG-OX) 400 (241.3 Mg) MG tablet Take 1 tablet (400 mg total) by mouth daily. 30 tablet 0   magnesium oxide (MAG-OX) 400 MG tablet Take 1 tablet (400 mg total) by mouth daily. 30 tablet 3   meclizine (ANTIVERT) 25 MG tablet  Take 1 tablet (25 mg total) by mouth 2 (two) times daily as needed for dizziness. 30 tablet 3   megestrol (MEGACE) 400 MG/10ML suspension Take 10 mLs (400 mg total) by mouth 2 (two) times daily. 480 mL 2   Multiple Minerals (CALCIUM/MAGNESIUM/ZINC PO) Take 1 tablet by mouth at bedtime.     omeprazole (PRILOSEC) 20 MG capsule Take 20 mg by mouth 2 (two) times daily.      potassium chloride (KLOR-CON) 10 MEQ tablet Take 1 tablet (10 mEq total) by mouth 2 (two) times daily. 90 tablet 3   prochlorperazine (COMPAZINE) 10 MG tablet Take 1 tablet (10 mg total) by mouth every 6 (six) hours as needed for nausea or vomiting. 60 tablet 3   sacubitril-valsartan (ENTRESTO) 24-26 MG Take 1 tablet by mouth 2 (two) times daily.     sildenafil (REVATIO) 20 MG tablet Take 20 mg by mouth daily.     spironolactone (ALDACTONE) 25 MG tablet Take 25 mg by mouth daily.     torsemide (DEMADEX) 20 MG tablet Take 20 mg by mouth 2 (two) times daily.     traMADol (ULTRAM) 50 MG tablet TAKE ONE TABLET BY MOUTH EVERY 8 HOURS AS NEEDED FOR SEVERE PAIN 60 tablet 0   vitamin B-12 (CYANOCOBALAMIN) 1000 MCG tablet Take 1,000 mcg by mouth daily.     zinc gluconate 50 MG tablet Take 50 mg by mouth daily.      zolpidem (AMBIEN) 10 MG tablet Take 1 tablet (10 mg total) by mouth at bedtime as needed for sleep. 20 tablet 0   lidocaine-prilocaine (EMLA) cream Apply 1 application topically as needed. Apply to portacath as needed (Patient not taking: Reported on 09/18/2020) 30 g 0   ondansetron (ZOFRAN) 4 MG tablet Take 4 mg by mouth every 8 (eight) hours as needed for nausea. (Patient not taking: Reported on 09/18/2020)     scopolamine (TRANSDERM-SCOP) 1 MG/3DAYS Transderm-Scop 1 mg over 3 days transdermal patch  Apply 1 patch by transdermal route. (Patient not taking: Reported on 09/18/2020)     No current  facility-administered medications for this visit.    ALLERGIES:  Allergies  Allergen Reactions   Vancomycin     Other reaction(s):  Red Man Syndrome Noted intraoperatively on 10/04/2018. No associated hemodynamic or respiratory changes. Please give slowly.    PHYSICAL EXAM:  Performance status (ECOG): 1 - Symptomatic but completely ambulatory  There were no vitals filed for this visit. Wt Readings from Last 3 Encounters:  09/18/20 180 lb 6.4 oz (81.8 kg)  08/28/20 198 lb 9.6 oz (90.1 kg)  08/21/20 197 lb 12.8 oz (89.7 kg)   Physical Exam Vitals reviewed.  Constitutional:      Appearance: Normal appearance.  Cardiovascular:     Rate and Rhythm: Normal rate and regular rhythm.     Pulses: Normal pulses.     Heart sounds: Normal heart sounds.  Pulmonary:     Effort: Pulmonary effort is normal.     Breath sounds: Normal breath sounds.  Abdominal:     Palpations: Abdomen is soft. There is no hepatomegaly, splenomegaly or mass.     Tenderness: There is no abdominal tenderness.  Musculoskeletal:     Right lower leg: Edema (+) present.     Left lower leg: Edema (+) present.  Neurological:     General: No focal deficit present.     Mental Status: He is alert and oriented to person, place, and time.  Psychiatric:        Mood and Affect: Mood normal.        Behavior: Behavior normal.    LABORATORY DATA:  I have reviewed the labs as listed.  CBC Latest Ref Rng & Units 09/18/2020 09/04/2020 08/28/2020  WBC 4.0 - 10.5 Julian/uL 12.3(H) 11.0(H) 10.6(H)  Hemoglobin 13.0 - 17.0 g/dL 11.5(L) 10.6(L) 10.0(L)  Hematocrit 39.0 - 52.0 % 35.9(L) 33.3(L) 31.1(L)  Platelets 150 - 400 Julian/uL 272 171 183   CMP Latest Ref Rng & Units 09/04/2020 08/28/2020 08/21/2020  Glucose 70 - 99 mg/dL 93 104(H) 100(H)  BUN 8 - 23 mg/dL 18 23 23   Creatinine 0.61 - 1.24 mg/dL 1.29(H) 1.56(H) 1.72(H)  Sodium 135 - 145 mmol/L 137 138 134(L)  Potassium 3.5 - 5.1 mmol/L 4.0 3.8 4.1  Chloride 98 - 111 mmol/L 101 102 100  CO2 22 - 32 mmol/L 30 29 28   Calcium 8.9 - 10.3 mg/dL 8.1(L) 8.0(L) 7.7(L)  Total Protein 6.5 - 8.1 g/dL 4.6(L) 4.9(L) 4.8(L)  Total  Bilirubin 0.3 - 1.2 mg/dL 0.5 0.4 0.5  Alkaline Phos 38 - 126 U/L 110 127(H) 128(H)  AST 15 - 41 U/L 11(L) 11(L) 11(L)  ALT 0 - 44 U/L 9 10 10     DIAGNOSTIC IMAGING:  I have independently reviewed the scans and discussed with the patient. No results found.   ASSESSMENT:  1.  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 Calton Dach -Dr. Joylene Grapes at [facility]-SPEP 0.2 g, immunofixation with biclonal IgG with lambda specificity. -Free kappa light chain 16.7, lambda light chains 122.7, ratio 0.14. -Kidney biopsy on 02/04/2020 with AL amyloidosis, lambda immunophenotype, Congo red stain positive.  Immunofluorescence microscopy shows dense homogeneous glomerular mesangial staining with antisera specific for IgG 1-2+, IgM 2+, C1q 2+, lambda light chains 3+.  Waxy homogeneous staining noted along the blood vessels.  Congo red stain demonstrates positive apocrine birefringence within the glomerular mesangial regions and along blood vessel walls. -48 pound weight loss since summer 2021 with decreased appetite.  Positive for fatigue. -Reports occasional  nausea, no vomiting.  Positive for occipital headaches. -Denies any tingling or numbness in the extremities.  He has cramps in the left arm which is new.  He had leg cramps for a long time. -Cardiac MRI on 02/29/2020 with EF 38%, no evidence of amyloid deposits in the myocardium. -PET scan on 02/26/2020 with scattered borderline mediastinal and hilar lymph nodes with low-level hypermetabolism most likely inflammatory or reactive.  No bone abnormalities.  No findings in the abdomen or pelvis. -Bone marrow biopsy on 02/26/2020 with slightly hypercellular bone marrow with 23% cells consistent with plasma cells, staining for lambda light chain.  No lymphoproliferative disorder.  Congo red stain was negative. -Bone marrow cytogenetics were normal.  FISH panel for multiple  myeloma was negative. -M spike was 0.1 g, immunofixation shows IgG lambda.  LDH was elevated at 216.  Beta-2 microglobulin 4.5. -Dara-CyBorD started on 03/20/2020.   2.  Social/family history: -He works for [employer] and retired 5 years ago. Never smoker. -Maternal aunt had cancer in male organs.  One maternal first cousin had appendiceal cancer, another maternal first cousin had cancer of the digestive system.   PLAN:  1.  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 - Reviewed 24-hour urine from 09/04/2020 which showed increase in total protein to 17.3.  Lambda light chains were slightly increased to 28.7.  M spike is stable at 0.1. - Reviewed labs from today which showed slightly improved hemoglobin.  LFTs are also within normal limits.  Creatinine is 1.69. - We talked about starting him on maintenance daratumumab at this time.  He will receive maintenance daratumumab every 4 weeks.  He will receive Decadron 20 mg today as premeds. - Continue infection prophylaxis with acyclovir. - RTC 4 weeks for follow-up.  We will check 24-hour urine and other labs at that time.   2.  Nausea: - Continue Compazine as needed.   3.  Leg weakness/pains: - Left sural nerve biopsy on 07/02/2020 did not show any evidence of amyloidosis.  Some demyelination consistent with neuropathy. - Leg strength has improved since last couple of weeks.  He will use tramadol/hydrocodone as needed if any pains.   4.  Sleeping difficulty: - He is not requiring any sleep medication at this time.   5.  Loss of appetite: - He is continuing to eat better and not requiring any appetite stimulants.   6.  Lower extremity swelling: - His swellings are better controlled with torsemide 20 mg twice daily and spironolactone 25 mg daily. - He also noted improvement in the leg strength since Entresto was started.    Orders placed this encounter:  No orders of the defined types were placed in this encounter.    Julian Jack, MD [place] [phone]   I, Thana Ates, am acting as a scribe for Dr. Derek Miranda.  I, Julian Jack MD, have reviewed the above documentation for accuracy and completeness, and I agree with the above.

## 2020-09-18 ENCOUNTER — Inpatient Hospital Stay (HOSPITAL_COMMUNITY): Payer: Medicare Other

## 2020-09-18 ENCOUNTER — Ambulatory Visit (HOSPITAL_COMMUNITY): Payer: Medicare Other | Admitting: Hematology

## 2020-09-18 ENCOUNTER — Inpatient Hospital Stay (HOSPITAL_BASED_OUTPATIENT_CLINIC_OR_DEPARTMENT_OTHER): Payer: Medicare Other | Admitting: Hematology

## 2020-09-18 ENCOUNTER — Other Ambulatory Visit: Payer: Self-pay

## 2020-09-18 DIAGNOSIS — E8581 Light chain (AL) amyloidosis: Secondary | ICD-10-CM | POA: Diagnosis not present

## 2020-09-18 DIAGNOSIS — C9000 Multiple myeloma not having achieved remission: Secondary | ICD-10-CM

## 2020-09-18 DIAGNOSIS — Z5111 Encounter for antineoplastic chemotherapy: Secondary | ICD-10-CM | POA: Diagnosis not present

## 2020-09-18 LAB — CBC WITH DIFFERENTIAL/PLATELET
Abs Immature Granulocytes: 0.07 10*3/uL (ref 0.00–0.07)
Basophils Absolute: 0.1 10*3/uL (ref 0.0–0.1)
Basophils Relative: 1 %
Eosinophils Absolute: 0.4 10*3/uL (ref 0.0–0.5)
Eosinophils Relative: 3 %
HCT: 35.9 % — ABNORMAL LOW (ref 39.0–52.0)
Hemoglobin: 11.5 g/dL — ABNORMAL LOW (ref 13.0–17.0)
Immature Granulocytes: 1 %
Lymphocytes Relative: 12 %
Lymphs Abs: 1.5 10*3/uL (ref 0.7–4.0)
MCH: 33 pg (ref 26.0–34.0)
MCHC: 32 g/dL (ref 30.0–36.0)
MCV: 103.2 fL — ABNORMAL HIGH (ref 80.0–100.0)
Monocytes Absolute: 1 10*3/uL (ref 0.1–1.0)
Monocytes Relative: 8 %
Neutro Abs: 9.4 10*3/uL — ABNORMAL HIGH (ref 1.7–7.7)
Neutrophils Relative %: 75 %
Platelets: 272 10*3/uL (ref 150–400)
RBC: 3.48 MIL/uL — ABNORMAL LOW (ref 4.22–5.81)
RDW: 15.6 % — ABNORMAL HIGH (ref 11.5–15.5)
WBC: 12.3 10*3/uL — ABNORMAL HIGH (ref 4.0–10.5)
nRBC: 0 % (ref 0.0–0.2)

## 2020-09-18 LAB — COMPREHENSIVE METABOLIC PANEL
ALT: 9 U/L (ref 0–44)
AST: 12 U/L — ABNORMAL LOW (ref 15–41)
Albumin: 2.2 g/dL — ABNORMAL LOW (ref 3.5–5.0)
Alkaline Phosphatase: 121 U/L (ref 38–126)
Anion gap: 7 (ref 5–15)
BUN: 27 mg/dL — ABNORMAL HIGH (ref 8–23)
CO2: 31 mmol/L (ref 22–32)
Calcium: 8.5 mg/dL — ABNORMAL LOW (ref 8.9–10.3)
Chloride: 100 mmol/L (ref 98–111)
Creatinine, Ser: 1.69 mg/dL — ABNORMAL HIGH (ref 0.61–1.24)
GFR, Estimated: 43 mL/min — ABNORMAL LOW (ref >60)
Glucose, Bld: 97 mg/dL (ref 70–99)
Potassium: 4 mmol/L (ref 3.5–5.1)
Sodium: 138 mmol/L (ref 135–145)
Total Bilirubin: 0.2 mg/dL — ABNORMAL LOW (ref 0.3–1.2)
Total Protein: 5.6 g/dL — ABNORMAL LOW (ref 6.5–8.1)

## 2020-09-18 LAB — MAGNESIUM: Magnesium: 1.9 mg/dL (ref 1.7–2.4)

## 2020-09-18 MED ORDER — DIPHENHYDRAMINE HCL 25 MG PO CAPS
50.0000 mg | ORAL_CAPSULE | Freq: Once | ORAL | Status: AC
  Administered 2020-09-18: 50 mg via ORAL
  Filled 2020-09-18: qty 2

## 2020-09-18 MED ORDER — DARATUMUMAB-HYALURONIDASE-FIHJ 1800-30000 MG-UT/15ML ~~LOC~~ SOLN
1800.0000 mg | Freq: Once | SUBCUTANEOUS | Status: AC
  Administered 2020-09-18: 1800 mg via SUBCUTANEOUS
  Filled 2020-09-18: qty 15

## 2020-09-18 MED ORDER — ACETAMINOPHEN 325 MG PO TABS
650.0000 mg | ORAL_TABLET | Freq: Once | ORAL | Status: AC
  Administered 2020-09-18: 650 mg via ORAL
  Filled 2020-09-18: qty 2

## 2020-09-18 MED ORDER — DEXAMETHASONE 4 MG PO TABS
20.0000 mg | ORAL_TABLET | Freq: Once | ORAL | Status: AC
  Administered 2020-09-18: 20 mg via ORAL
  Filled 2020-09-18: qty 5

## 2020-09-18 MED ORDER — SODIUM CHLORIDE 0.9% FLUSH
10.0000 mL | Freq: Once | INTRAVENOUS | Status: AC
  Administered 2020-09-18: 10 mL via INTRAVENOUS

## 2020-09-18 MED ORDER — HEPARIN SOD (PORK) LOCK FLUSH 100 UNIT/ML IV SOLN
500.0000 [IU] | Freq: Once | INTRAVENOUS | Status: AC
Start: 2020-09-18 — End: 2020-09-18
  Administered 2020-09-18: 500 [IU] via INTRAVENOUS

## 2020-09-18 NOTE — Progress Notes (Signed)
Patient has been assessed, vital signs and labs have been reviewed by Dr. Delton Coombes. ANC, Creatinine, LFTs, and Platelets are within treatment parameters per Dr. Delton Coombes. The patient is good to proceed with treatment at this time.  Aware of protein today.  Primary RN and pharmacy aware.

## 2020-09-18 NOTE — Progress Notes (Signed)
Patient tolerated Daratumumab injection with no complaints voiced.  See MAR for details.  Labs reviewed. Injection site clean and dry with no bruising or swelling noted at site.  Band aid applied.  Vss with discharge and left in satisfactory condition with no s/s of distress noted.    Patients port flushed without difficulty.  Good blood return noted with no bruising or swelling noted at site.  Band aid applied.  VSS with discharge and left in satisfactory condition with no s/s of distress noted.

## 2020-09-18 NOTE — Patient Instructions (Addendum)
[facility] at [facility] Discharge Instructions  You were seen today by Dr. Katragadda. He went over your recent results, and you received your treatment. Dr. Katragadda will see you back in 1 month for labs and follow up.   Thank you for choosing [facility] at [facility] to provide your oncology and hematology care.  To afford each patient quality time with our provider, please arrive at least 15 minutes before your scheduled appointment time.   If you have a lab appointment with the Cancer Center please come in thru the Main Entrance and check in at the main information desk  You need to re-schedule your appointment should you arrive 10 or more minutes late.  We strive to give you quality time with our providers, and arriving late affects you and other patients whose appointments are after yours.  Also, if you no show three or more times for appointments you may be dismissed from the clinic at the providers discretion.     Again, thank you for choosing [facility].  Our hope is that these requests will decrease the amount of time that you wait before being seen by our physicians.       _____________________________________________________________  Should you have questions after your visit to [facility], please contact our office at [phone] between the hours of 8:00 a.m. and 4:30 p.m.  Voicemails left after 4:00 p.m. will not be returned until the following business day.  For prescription refill requests, have your pharmacy contact our office and allow 72 hours.    Cancer Center Support Programs:   > Cancer Support Group  2nd Tuesday of the month 1pm-2pm, Journey Room   

## 2020-09-18 NOTE — Patient Instructions (Signed)
[facility]  Discharge Instructions: Thank you for choosing [facility] to provide your oncology and hematology care.  If you have a lab appointment with the [facility], please come in thru the Main Entrance and check in at the main information desk.  Wear comfortable clothing and clothing appropriate for easy access to any Portacath or PICC line.   We strive to give you quality time with your provider. You may need to reschedule your appointment if you arrive late (15 or more minutes).  Arriving late affects you and other patients whose appointments are after yours.  Also, if you miss three or more appointments without notifying the office, you may be dismissed from the clinic at the provider's discretion.      For prescription refill requests, have your pharmacy contact our office and allow 72 hours for refills to be completed.    Today you received the following chemotherapy and/or immunotherapy agents darzalex.    To help prevent nausea and vomiting after your treatment, we encourage you to take your nausea medication as directed.  BELOW ARE SYMPTOMS THAT SHOULD BE REPORTED IMMEDIATELY: *FEVER GREATER THAN 100.4 F (38 C) OR HIGHER *CHILLS OR SWEATING *NAUSEA AND VOMITING THAT IS NOT CONTROLLED WITH YOUR NAUSEA MEDICATION *UNUSUAL SHORTNESS OF BREATH *UNUSUAL BRUISING OR BLEEDING *URINARY PROBLEMS (pain or burning when urinating, or frequent urination) *BOWEL PROBLEMS (unusual diarrhea, constipation, pain near the anus) TENDERNESS IN MOUTH AND THROAT WITH OR WITHOUT PRESENCE OF ULCERS (sore throat, sores in mouth, or a toothache) UNUSUAL RASH, SWELLING OR PAIN  UNUSUAL VAGINAL DISCHARGE OR ITCHING   Items with * indicate a potential emergency and should be followed up as soon as possible or go to the Emergency Department if any problems should occur.  Please show the CHEMOTHERAPY ALERT CARD or IMMUNOTHERAPY ALERT CARD at check-in to the Emergency  Department and triage nurse.  Should you have questions after your visit or need to cancel or reschedule your appointment, please contact [facility] [phone]  and follow the prompts.  Office hours are 8:00 a.m. to 4:30 p.m. Monday - Friday. Please note that voicemails left after 4:00 p.m. may not be returned until the following business day.  We are closed weekends and major holidays. You have access to a nurse at all times for urgent questions. Please call the main number to the clinic [phone] and follow the prompts.  For any non-urgent questions, you may also contact your provider using MyChart. We now offer e-Visits for anyone 30 and older to request care online for non-urgent symptoms. For details visit [email or web address].   Also download the MyChart app! Go to the app store, search "MyChart", open the app, select [facility], and log in with your MyChart username and password.  Due to Covid, a mask is required upon entering the hospital/clinic. If you do not have a mask, one will be given to you upon arrival. For doctor visits, patients may have 1 support person aged 76 or older with them. For treatment visits, patients cannot have anyone with them due to current Covid guidelines and our immunocompromised population.

## 2020-09-20 LAB — PROTEIN ELECTROPHORESIS, SERUM
A/G Ratio: 0.8 (ref 0.7–1.7)
Albumin ELP: 2.2 g/dL — ABNORMAL LOW (ref 2.9–4.4)
Alpha-1-Globulin: 0.2 g/dL (ref 0.0–0.4)
Alpha-2-Globulin: 1.4 g/dL — ABNORMAL HIGH (ref 0.4–1.0)
Beta Globulin: 0.6 g/dL — ABNORMAL LOW (ref 0.7–1.3)
Gamma Globulin: 0.3 g/dL — ABNORMAL LOW (ref 0.4–1.8)
Globulin, Total: 2.6 g/dL (ref 2.2–3.9)
Total Protein ELP: 4.8 g/dL — ABNORMAL LOW (ref 6.0–8.5)

## 2020-10-15 ENCOUNTER — Inpatient Hospital Stay (HOSPITAL_COMMUNITY): Payer: Medicare Other | Attending: Hematology

## 2020-10-15 DIAGNOSIS — C9000 Multiple myeloma not having achieved remission: Secondary | ICD-10-CM | POA: Diagnosis present

## 2020-10-15 NOTE — Progress Notes (Signed)
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  PRIOR THERAPY: none  NGS Results: not done  CURRENT THERAPY: DaraCyBorD & Aloxi weekly  BRIEF ONCOLOGIC HISTORY:  Oncology History  Multiple myeloma ([place])  03/06/2020 Initial Diagnosis   Multiple myeloma ([place])    03/06/2020 Cancer Staging   Staging form: Plasma Cell Myeloma and Plasma Cell Disorders, AJCC 8th Edition - Clinical stage from 03/06/2020: RISS Stage II (Beta-2-microglobulin (mg/L): 4.5, Albumin (g/dL): 2.5, ISS: Stage II, High-risk cytogenetics: Absent, LDH: Elevated) - Signed by Derek Jack, MD on 03/06/2020    03/20/2020 - 03/20/2020 Chemotherapy   The patient had dexamethasone (DECADRON) tablet 40 mg, 40 mg, Oral,  Once, 0 of 4 cycles bortezomib SQ (VELCADE) chemo injection (2.8m/mL concentration) 2.75 mg, 1.3 mg/m2 = 2.75 mg, Subcutaneous,  Once, 0 of 4 cycles   for chemotherapy treatment.     03/20/2020 -  Chemotherapy    Patient is on Treatment Plan: PRIMARY AMYLOIDOSIS DARACYBORD (DARATUMUMAB SQ + CYCLOPHOSPHAMIDE PO + BORTEZOMIB SQ + DEXAMETHASONE PO/IV) Q28D X 6 CYCLES / DARATUMUMAB SQ Q28D         CANCER STAGING: Cancer Staging Multiple myeloma ([place] Staging form: Plasma Cell Myeloma and Plasma Cell Disorders, AJCC 8th Edition - Clinical stage from 03/06/2020: RISS Stage II (Beta-2-microglobulin (mg/L): 4.5, Albumin (g/dL): 2.5, ISS: Stage II, High-risk cytogenetics: Absent, LDH: Elevated) - Signed by KDerek Jack MD on 03/06/2020  Virtual Visit via Video Note  I connected with Julian Miranda @TODAY @ at 10:30 AM EDT by a video enabled telemedicine application and verified that I am speaking with the correct person using two identifiers.  Location: Patient: clinic Provider:  home  Others present: TRenda Rolls RN  I discussed the limitations of evaluation and management by telemedicine and the availability of in person appointments. The patient expressed understanding and agreed to proceed.  INTERVAL HISTORY:  [name] a 70y.o. male, returns for routine follow-up and consideration for next cycle of chemotherapy. Julian Miranda last seen on 09/18/20.  Due for cycle #9 of DaraCyBorD & Aloxi today.   Overall, he tells me he has been feeling pretty well. He reports increased strength and improved pain in his legs. He reports one fall due to leg numbness. Over the past 3 days he reports indigestion, bloating, and increased belching. He denies having any similar symptoms previously. His appetite is good. He denies n/v/d, recent fevers, infections, or tingling and numbness in hands and feet.  Overall, he feels ready for next cycle of chemo today.   REVIEW OF SYSTEMS:  Review of Systems  Constitutional:  Negative for appetite change and fever.  Gastrointestinal:  Negative for diarrhea, nausea and vomiting.  Musculoskeletal:  Positive for myalgias (improved leg pain).  Neurological:  Negative for numbness.   PAST MEDICAL/SURGICAL HISTORY:  Past Medical History:  Diagnosis Date   CAD (coronary artery disease)    GERD (gastroesophageal reflux disease)    Hypertension    Myocardial infarction ([facility] 1994   [place]    Stroke ([facility]    Past Surgical History:  Procedure Laterality Date   BYPASS GRAFT POPLITEAL TO POPLITEAL Right 03/23/2018   Procedure: BYPASS GRAFT RIGHT ABOVE KNEE POPLITEAL TO BELOW KNEE POPLITEAL ARTERY  USING RIGHT GREAT SAPHENOUS VEIN;  Surgeon: CMonica Martinez  J, MD;  Location: [facility] OR;  Service: Vascular;  Laterality: Right;   BYPASS GRAFT POPLITEAL TO POPLITEAL Left 04/09/2019   Procedure: BYPASS  ABOVE KNEE POPLITEAL TO BELOW KNEE POPLITEAL AND LIGATION OF LEFT POPITEAL ANEURYSM;  Surgeon: Marty Heck,  MD;  Location: [facility] OR;  Service: Vascular;  Laterality: Left;   COLONOSCOPY WITH ESOPHAGOGASTRODUODENOSCOPY (EGD)     CORONARY ARTERY BYPASS GRAFT     EYE SURGERY Bilateral    cataracts   FEMORAL BYPASS Right 03/23/2018   HERNIA REPAIR     IR IMAGING GUIDED PORT INSERTION  03/25/2020   MASTOIDECTOMY     ROTATOR CUFF REPAIR Right    TONSILLECTOMY     VEIN HARVEST Right 03/23/2018   Procedure: VEIN HARVEST RIGHT GREAT SAPHENOUS;  Surgeon: Marty Heck, MD;  [facility];  Service: Vascular;  Laterality: Right;   VEIN HARVEST Left 04/09/2019   Procedure: Harvest Greater Saphenous Vein and  Revise Elisa Lateral Saphenous Vein ;  Surgeon: Marty Heck, MD;  Location: [facility] OR;  Service: Vascular;  Laterality: Left;    SOCIAL HISTORY:  Social History   Socioeconomic History   Marital status: Married    Spouse name: Not on file   Number of children: Not on file   Years of education: Not on file   Highest education level: Not on file  Occupational History   Not on file  Tobacco Use   Smoking status: Never   Smokeless tobacco: Never  Vaping Use   Vaping Use: Never used  Substance and Sexual Activity   Alcohol use: Never   Drug use: Never   Sexual activity: Not on file  Other Topics Concern   Not on file  Social History Narrative   Not on file   Social Determinants of Health   Financial Resource Strain: Low Risk    Difficulty of Paying Living Expenses: Not hard at all  Food Insecurity: No Food Insecurity   Worried About Running Out of Food in the Last Year: Never true   Arboriculturist in the Last Year: Never true  Transportation Needs: No Transportation Needs   Lack of Transportation (Medical): No   Lack of Transportation (Non-Medical): No  Physical Activity: Insufficiently Active   Days of Exercise per Week: 2 days   Minutes of Exercise per Session: 20 min  Stress: Stress Concern Present   Feeling of Stress : To some extent  Social Connections: Moderately  Integrated   Frequency of Communication with Friends and Family: More than three times a week   Frequency of Social Gatherings with Friends and Family: Twice a week   Attends Religious Services: More than 4 times per year   Active Member of [employer] or Organizations: No   Attends Music therapist: Never   Marital Status: Married  Human resources officer Violence: Not At Risk   Fear of Current or Ex-Partner: No   Emotionally Abused: No   Physically Abused: No   Sexually Abused: No    FAMILY HISTORY:  Family History  Problem Relation Age of Onset   Heart disease Mother     CURRENT MEDICATIONS:  Current Outpatient Medications  Medication Sig Dispense Refill   acetaminophen (TYLENOL) 500 MG tablet Take 500 mg by mouth 2 (two) times daily.     acyclovir (ZOVIRAX) 400 MG tablet Take 1 tablet (400 mg total) by mouth 2 (two) times daily. 60 tablet 5   ALPRAZolam (XANAX) 0.25 MG tablet Take 1 tablet (  0.25 mg total) by mouth in the morning, at noon, in the evening, and at bedtime. 120 tablet 0   aspirin 81 MG tablet Take 81 mg by mouth daily.     atorvastatin (LIPITOR) 80 MG tablet Take 80 mg by mouth at bedtime.      bumetanide (BUMEX) 1 MG tablet Take 1 tablet (1 mg total) by mouth daily. Take 1 tablet in the am and 1/2 tablet in pm.  Do not give if top BP number less than 100. 60 tablet 3   carvedilol (COREG) 25 MG tablet Take 25 mg by mouth daily.      chlorproMAZINE (THORAZINE) 25 MG tablet Take 1 tablet (25 mg total) by mouth 4 (four) times daily as needed. For hiccups 60 tablet 2   diazepam (VALIUM) 2 MG tablet Take 1 tablet (2 mg total) by mouth daily as needed (dizziness). 30 tablet 0   ergocalciferol (VITAMIN D2) 1.25 MG (50000 UT) capsule Take 1 capsule (50,000 Units total) by mouth once a week. 8 capsule 3   gabapentin (NEURONTIN) 300 MG capsule Take 1 capsule (300 mg total) by mouth daily. 30 capsule 6   HYDROcodone-acetaminophen (NORCO) 5-325 MG tablet Take 1 tablet by mouth  every 8 (eight) hours as needed for moderate pain. 60 tablet 0   icosapent Ethyl (VASCEPA) 1 g capsule Take 1 g by mouth 2 (two) times daily.     Iron-Vitamin C (VITRON-C) 65-125 MG TABS Take 1 tablet by mouth daily. 60 tablet 3   lidocaine (XYLOCAINE) 2 % solution Use as directed 15 mLs in the mouth or throat as needed for mouth pain. 15 mL 3   lidocaine-prilocaine (EMLA) cream Apply 1 application topically as needed. Apply to portacath as needed (Patient not taking: Reported on 09/18/2020) 30 g 0   magnesium oxide (MAG-OX) 400 (241.3 Mg) MG tablet Take 1 tablet (400 mg total) by mouth daily. 30 tablet 0   magnesium oxide (MAG-OX) 400 MG tablet Take 1 tablet (400 mg total) by mouth daily. 30 tablet 3   meclizine (ANTIVERT) 25 MG tablet Take 1 tablet (25 mg total) by mouth 2 (two) times daily as needed for dizziness. 30 tablet 3   megestrol (MEGACE) 400 MG/10ML suspension Take 10 mLs (400 mg total) by mouth 2 (two) times daily. 480 mL 2   Multiple Minerals (CALCIUM/MAGNESIUM/ZINC PO) Take 1 tablet by mouth at bedtime.     omeprazole (PRILOSEC) 20 MG capsule Take 20 mg by mouth 2 (two) times daily.      ondansetron (ZOFRAN) 4 MG tablet Take 4 mg by mouth every 8 (eight) hours as needed for nausea. (Patient not taking: Reported on 09/18/2020)     potassium chloride (KLOR-CON) 10 MEQ tablet Take 1 tablet (10 mEq total) by mouth 2 (two) times daily. 90 tablet 3   prochlorperazine (COMPAZINE) 10 MG tablet Take 1 tablet (10 mg total) by mouth every 6 (six) hours as needed for nausea or vomiting. 60 tablet 3   sacubitril-valsartan (ENTRESTO) 24-26 MG Take 1 tablet by mouth 2 (two) times daily.     scopolamine (TRANSDERM-SCOP) 1 MG/3DAYS Transderm-Scop 1 mg over 3 days transdermal patch  Apply 1 patch by transdermal route. (Patient not taking: Reported on 09/18/2020)     sildenafil (REVATIO) 20 MG tablet Take 20 mg by mouth daily.     spironolactone (ALDACTONE) 25 MG tablet Take 25 mg by mouth daily.      torsemide (DEMADEX) 20 MG tablet Take 20 mg by  mouth 2 (two) times daily.     traMADol (ULTRAM) 50 MG tablet TAKE ONE TABLET BY MOUTH EVERY 8 HOURS AS NEEDED FOR SEVERE PAIN 60 tablet 0   vitamin B-12 (CYANOCOBALAMIN) 1000 MCG tablet Take 1,000 mcg by mouth daily.     zinc gluconate 50 MG tablet Take 50 mg by mouth daily.      zolpidem (AMBIEN) 10 MG tablet Take 1 tablet (10 mg total) by mouth at bedtime as needed for sleep. 20 tablet 0   No current facility-administered medications for this visit.    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 No associated hemodynamic or respiratory changes. Please give slowly.    Performance status (ECOG): 1 - Symptomatic but completely ambulatory  There were no vitals filed for this visit. Wt Readings from Last 3 Encounters:  09/18/20 180 lb 6.4 oz (81.8 kg)  08/28/20 198 lb 9.6 oz (90.1 kg)  08/21/20 197 lb 12.8 oz (89.7 kg)    LABORATORY DATA:  I have reviewed the labs as listed.  CBC Latest Ref Rng & Units 09/18/2020 09/04/2020 08/28/2020  WBC 4.0 - 10.5 K/uL 12.3(H) 11.0(H) 10.6(H)  Hemoglobin 13.0 - 17.0 g/dL 11.5(L) 10.6(L) 10.0(L)  Hematocrit 39.0 - 52.0 % 35.9(L) 33.3(L) 31.1(L)  Platelets 150 - 400 K/uL 272 171 183   CMP Latest Ref Rng & Units 09/18/2020 09/04/2020 08/28/2020  Glucose 70 - 99 mg/dL 97 93 104(H)  BUN 8 - 23 mg/dL 27(H) 18 23  Creatinine 0.61 - 1.24 mg/dL 1.69(H) 1.29(H) 1.56(H)  Sodium 135 - 145 mmol/L 138 137 138  Potassium 3.5 - 5.1 mmol/L 4.0 4.0 3.8  Chloride 98 - 111 mmol/L 100 101 102  CO2 22 - 32 mmol/L 31 30 29   Calcium 8.9 - 10.3 mg/dL 8.5(L) 8.1(L) 8.0(L)  Total Protein 6.5 - 8.1 g/dL 5.6(L) 4.6(L) 4.9(L)  Total Bilirubin 0.3 - 1.2 mg/dL 0.2(L) 0.5 0.4  Alkaline Phos 38 - 126 U/L 121 110 127(H)  AST 15 - 41 U/L 12(L) 11(L) 11(L)  ALT 0 - 44 U/L 9 9 10     DIAGNOSTIC IMAGING:  I have independently reviewed the scans  and discussed with the patient. No results found.   ASSESSMENT:  1.  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 Calton Dach -Dr. Joylene Grapes at [facility]-SPEP 0.2 g, immunofixation with biclonal IgG with lambda specificity. -Free kappa light chain 16.7, lambda light chains 122.7, ratio 0.14. -Kidney biopsy on 02/04/2020 with AL amyloidosis, lambda immunophenotype, Congo red stain positive.  Immunofluorescence microscopy shows dense homogeneous glomerular mesangial staining with antisera specific for IgG 1-2+, IgM 2+, C1q 2+, lambda light chains 3+.  Waxy homogeneous staining noted along the blood vessels.  Congo red stain demonstrates positive apocrine birefringence within the glomerular mesangial regions and along blood vessel walls. -48 pound weight loss since summer 2021 with decreased appetite.  Positive for fatigue. -Reports occasional nausea, no vomiting.  Positive for occipital headaches. -Denies any tingling or numbness in the extremities.  He has cramps in the left arm which is new.  He had leg cramps for a long time. -Cardiac MRI on 02/29/2020 with EF 38%, no evidence of amyloid deposits in the myocardium. -PET scan on 02/26/2020 with scattered borderline mediastinal and hilar lymph nodes with low-level hypermetabolism most likely inflammatory or reactive.  No bone abnormalities.  No findings in the  abdomen or pelvis. -Bone marrow biopsy on 02/26/2020 with slightly hypercellular bone marrow with 23% cells consistent with plasma cells, staining for lambda light chain.  No lymphoproliferative disorder.  Congo red stain was negative. -Bone marrow cytogenetics were normal.  FISH panel for multiple myeloma was negative. -M spike was 0.1 g, immunofixation shows IgG lambda.  LDH was elevated at 216.  Beta-2 microglobulin 4.5. -Dara-CyBorD started on 03/20/2020.   2.  Social/family history: -He works for [employer] and retired 5 years ago. Never smoker. -Maternal aunt had cancer in male organs.  One maternal first cousin had appendiceal cancer, another maternal first cousin had cancer of the digestive system.   PLAN:  1.  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 - 24-hour urine was sent yesterday. - He reportedly fell once last week when he got up from sleep.  His leg was apparently asleep and give out.  Denies any tingling or numbness in extremities.  No fevers or infections.  No GI symptoms.  No new onset pains. - Reviewed labs from 09/18/2020 which showed SPEP with no M spike.  Lambda free light chains are 28.7 on 09/04/2020. - Labs from today shows normal CBC and LFTs.  Creatinine has increased to 2.36 with a BUN of 30. - He will proceed with maintenance daratumumab every 4 weeks. - We will follow-up on SPEP and free light chains from today.  We will also follow-up on 24-hour urine.  I will plan to do 24-hour urine every 2 months. - RTC 4 weeks for follow-up with labs and treatment.   2.  Nausea: - Continue Compazine as needed.   3.  Leg weakness/pains: - Left sural nerve biopsy on 07/02/2020 did not show any evidence of amyloidosis.  Some demyelination consistent with neuropathy. - He reports improvement in leg strength in the last 6 weeks. - He is not reporting any leg pains.  However he takes 1 tablet of tramadol at nighttime for sleep.   4.  Elevated creatinine: - His creatinine increased to 2.36.  We will cut back on torsemide dose.   5.  Loss of appetite: - He is continuing to eat better and not requiring any appetite stimulants.   6.  Lower extremity swelling: - He is currently taking torsemide 20 mg twice daily and spironolactone 25 mg daily. - He does not have any swelling at this time. - However his creatinine has increased to 2.36 and BUN 30. - I will cut back on  torsemide to 20 mg once daily and spironolactone 25 mg daily.   Orders placed this encounter:  No orders of the defined types were placed in this encounter.  I provided 30 minutes of non-face-to-face time during this encounter.  Derek Jack, MD [place] [phone]   I, Thana Ates, am acting as a scribe for Dr. Derek Jack.  I, Derek Jack MD, have reviewed the above documentation for accuracy and completeness, and I agree with the above.

## 2020-10-16 ENCOUNTER — Other Ambulatory Visit: Payer: Self-pay

## 2020-10-16 ENCOUNTER — Encounter (HOSPITAL_COMMUNITY): Payer: Medicare Other | Admitting: Dietician

## 2020-10-16 ENCOUNTER — Inpatient Hospital Stay (HOSPITAL_COMMUNITY): Payer: Medicare Other | Admitting: Dietician

## 2020-10-16 ENCOUNTER — Other Ambulatory Visit (HOSPITAL_COMMUNITY): Payer: Self-pay

## 2020-10-16 ENCOUNTER — Other Ambulatory Visit (HOSPITAL_COMMUNITY): Payer: Self-pay | Admitting: *Deleted

## 2020-10-16 ENCOUNTER — Inpatient Hospital Stay (HOSPITAL_BASED_OUTPATIENT_CLINIC_OR_DEPARTMENT_OTHER): Payer: Medicare Other | Admitting: Hematology

## 2020-10-16 ENCOUNTER — Inpatient Hospital Stay (HOSPITAL_COMMUNITY): Payer: Medicare Other

## 2020-10-16 ENCOUNTER — Inpatient Hospital Stay (HOSPITAL_COMMUNITY): Payer: Medicare Other | Attending: Hematology

## 2020-10-16 DIAGNOSIS — C9000 Multiple myeloma not having achieved remission: Secondary | ICD-10-CM

## 2020-10-16 DIAGNOSIS — R944 Abnormal results of kidney function studies: Secondary | ICD-10-CM | POA: Diagnosis not present

## 2020-10-16 DIAGNOSIS — E8581 Light chain (AL) amyloidosis: Secondary | ICD-10-CM | POA: Insufficient documentation

## 2020-10-16 DIAGNOSIS — Z5111 Encounter for antineoplastic chemotherapy: Secondary | ICD-10-CM | POA: Diagnosis present

## 2020-10-16 DIAGNOSIS — M7989 Other specified soft tissue disorders: Secondary | ICD-10-CM

## 2020-10-16 DIAGNOSIS — Z79899 Other long term (current) drug therapy: Secondary | ICD-10-CM

## 2020-10-16 DIAGNOSIS — R110 Nausea: Secondary | ICD-10-CM | POA: Diagnosis not present

## 2020-10-16 LAB — CBC WITH DIFFERENTIAL/PLATELET
Abs Immature Granulocytes: 0.05 10*3/uL (ref 0.00–0.07)
Basophils Absolute: 0.1 10*3/uL (ref 0.0–0.1)
Basophils Relative: 1 %
Eosinophils Absolute: 0.3 10*3/uL (ref 0.0–0.5)
Eosinophils Relative: 3 %
HCT: 35.2 % — ABNORMAL LOW (ref 39.0–52.0)
Hemoglobin: 11.3 g/dL — ABNORMAL LOW (ref 13.0–17.0)
Immature Granulocytes: 0 %
Lymphocytes Relative: 12 %
Lymphs Abs: 1.5 10*3/uL (ref 0.7–4.0)
MCH: 32.5 pg (ref 26.0–34.0)
MCHC: 32.1 g/dL (ref 30.0–36.0)
MCV: 101.1 fL — ABNORMAL HIGH (ref 80.0–100.0)
Monocytes Absolute: 0.9 10*3/uL (ref 0.1–1.0)
Monocytes Relative: 8 %
Neutro Abs: 9 10*3/uL — ABNORMAL HIGH (ref 1.7–7.7)
Neutrophils Relative %: 76 %
Platelets: 265 10*3/uL (ref 150–400)
RBC: 3.48 MIL/uL — ABNORMAL LOW (ref 4.22–5.81)
RDW: 15.4 % (ref 11.5–15.5)
WBC: 11.9 10*3/uL — ABNORMAL HIGH (ref 4.0–10.5)
nRBC: 0 % (ref 0.0–0.2)

## 2020-10-16 LAB — COMPREHENSIVE METABOLIC PANEL
ALT: 9 U/L (ref 0–44)
AST: 13 U/L — ABNORMAL LOW (ref 15–41)
Albumin: 2.4 g/dL — ABNORMAL LOW (ref 3.5–5.0)
Alkaline Phosphatase: 97 U/L (ref 38–126)
Anion gap: 9 (ref 5–15)
BUN: 30 mg/dL — ABNORMAL HIGH (ref 8–23)
CO2: 28 mmol/L (ref 22–32)
Calcium: 8.3 mg/dL — ABNORMAL LOW (ref 8.9–10.3)
Chloride: 102 mmol/L (ref 98–111)
Creatinine, Ser: 2.36 mg/dL — ABNORMAL HIGH (ref 0.61–1.24)
GFR, Estimated: 29 mL/min — ABNORMAL LOW (ref >60)
Glucose, Bld: 145 mg/dL — ABNORMAL HIGH (ref 70–99)
Potassium: 3.7 mmol/L (ref 3.5–5.1)
Sodium: 139 mmol/L (ref 135–145)
Total Bilirubin: 0.5 mg/dL (ref 0.3–1.2)
Total Protein: 5.4 g/dL — ABNORMAL LOW (ref 6.5–8.1)

## 2020-10-16 LAB — MAGNESIUM: Magnesium: 1.7 mg/dL (ref 1.7–2.4)

## 2020-10-16 MED ORDER — DARATUMUMAB-HYALURONIDASE-FIHJ 1800-30000 MG-UT/15ML ~~LOC~~ SOLN
1800.0000 mg | Freq: Once | SUBCUTANEOUS | Status: AC
  Administered 2020-10-16: 1800 mg via SUBCUTANEOUS
  Filled 2020-10-16: qty 15

## 2020-10-16 MED ORDER — SODIUM CHLORIDE 0.9% FLUSH
10.0000 mL | Freq: Once | INTRAVENOUS | Status: AC
  Administered 2020-10-16: 10 mL via INTRAVENOUS

## 2020-10-16 MED ORDER — HEPARIN SOD (PORK) LOCK FLUSH 100 UNIT/ML IV SOLN
500.0000 [IU] | Freq: Once | INTRAVENOUS | Status: AC
  Administered 2020-10-16: 500 [IU] via INTRAVENOUS

## 2020-10-16 MED ORDER — DIPHENHYDRAMINE HCL 25 MG PO CAPS
50.0000 mg | ORAL_CAPSULE | Freq: Once | ORAL | Status: AC
  Administered 2020-10-16: 50 mg via ORAL

## 2020-10-16 MED ORDER — ACETAMINOPHEN 325 MG PO TABS
650.0000 mg | ORAL_TABLET | Freq: Once | ORAL | Status: AC
Start: 2020-10-16 — End: 2020-10-16
  Administered 2020-10-16: 650 mg via ORAL

## 2020-10-16 MED ORDER — ACETAMINOPHEN 325 MG PO TABS
ORAL_TABLET | ORAL | Status: AC
  Filled 2020-10-16: qty 2

## 2020-10-16 MED ORDER — DEXAMETHASONE 4 MG PO TABS
20.0000 mg | ORAL_TABLET | Freq: Once | ORAL | Status: AC
  Administered 2020-10-16: 20 mg via ORAL

## 2020-10-16 MED ORDER — TRAMADOL HCL 50 MG PO TABS: ORAL_TABLET | ORAL | 3 refills | Status: DC

## 2020-10-16 MED ORDER — HYDROCODONE-ACETAMINOPHEN 5-325 MG PO TABS: 1.0000 | ORAL_TABLET | Freq: Three times a day (TID) | ORAL | 0 refills | Status: DC | PRN

## 2020-10-16 MED ORDER — DEXAMETHASONE 4 MG PO TABS: ORAL_TABLET | ORAL | 11 refills | Status: DC

## 2020-10-16 MED ORDER — DIPHENHYDRAMINE HCL 25 MG PO CAPS
ORAL_CAPSULE | ORAL | Status: AC
  Filled 2020-10-16: qty 2

## 2020-10-16 MED ORDER — DEXAMETHASONE 4 MG PO TABS
ORAL_TABLET | ORAL | Status: AC
  Filled 2020-10-16: qty 5

## 2020-10-16 NOTE — Patient Instructions (Addendum)
[facility] at [facility] Discharge Instructions  You were seen today by Dr. Katragadda. He went over your recent results, and you received your treatment. Dr. Katragadda will see you back in 1 month for labs and follow up.   Thank you for choosing [facility] at [facility] to provide your oncology and hematology care.  To afford each patient quality time with our provider, please arrive at least 15 minutes before your scheduled appointment time.   If you have a lab appointment with the Cancer Center please come in thru the Main Entrance and check in at the main information desk  You need to re-schedule your appointment should you arrive 10 or more minutes late.  We strive to give you quality time with our providers, and arriving late affects you and other patients whose appointments are after yours.  Also, if you no show three or more times for appointments you may be dismissed from the clinic at the providers discretion.     Again, thank you for choosing [facility].  Our hope is that these requests will decrease the amount of time that you wait before being seen by our physicians.       _____________________________________________________________  Should you have questions after your visit to [facility], please contact our office at [phone] between the hours of 8:00 a.m. and 4:30 p.m.  Voicemails left after 4:00 p.m. will not be returned until the following business day.  For prescription refill requests, have your pharmacy contact our office and allow 72 hours.    Cancer Center Support Programs:   > Cancer Support Group  2nd Tuesday of the month 1pm-2pm, Journey Room   

## 2020-10-16 NOTE — Telephone Encounter (Signed)
Patient wishes to take premedications at home prior to arrival for injection appointments. Dr. Delton Coombes made aware. Prescription dexamethasone sent to [facility]. Patient given instructions on how to take dexamethasone, tylenol and benadryl prior to arrival. Patient verbalized understanding.

## 2020-10-16 NOTE — Progress Notes (Signed)
Port flushed with good blood return noted. No bruising or swelling at site. Awaiting labs for treatment.

## 2020-10-16 NOTE — Patient Instructions (Signed)
[facility]  Discharge Instructions: Thank you for choosing [facility] to provide your oncology and hematology care.  If you have a lab appointment with the [facility], please come in thru the Main Entrance and check in at the main information desk.  Wear comfortable clothing and clothing appropriate for easy access to any Portacath or PICC line.   We strive to give you quality time with your provider. You may need to reschedule your appointment if you arrive late (15 or more minutes).  Arriving late affects you and other patients whose appointments are after yours.  Also, if you miss three or more appointments without notifying the office, you may be dismissed from the clinic at the provider's discretion.      For prescription refill requests, have your pharmacy contact our office and allow 72 hours for refills to be completed.    Today you received the following chemotherapy and/or immunotherapy agents Daratumumab injection      To help prevent nausea and vomiting after your treatment, we encourage you to take your nausea medication as directed.  BELOW ARE SYMPTOMS THAT SHOULD BE REPORTED IMMEDIATELY: *FEVER GREATER THAN 100.4 F (38 C) OR HIGHER *CHILLS OR SWEATING *NAUSEA AND VOMITING THAT IS NOT CONTROLLED WITH YOUR NAUSEA MEDICATION *UNUSUAL SHORTNESS OF BREATH *UNUSUAL BRUISING OR BLEEDING *URINARY PROBLEMS (pain or burning when urinating, or frequent urination) *BOWEL PROBLEMS (unusual diarrhea, constipation, pain near the anus) TENDERNESS IN MOUTH AND THROAT WITH OR WITHOUT PRESENCE OF ULCERS (sore throat, sores in mouth, or a toothache) UNUSUAL RASH, SWELLING OR PAIN  UNUSUAL VAGINAL DISCHARGE OR ITCHING   Items with * indicate a potential emergency and should be followed up as soon as possible or go to the Emergency Department if any problems should occur.  Please show the CHEMOTHERAPY ALERT CARD or IMMUNOTHERAPY ALERT CARD at check-in to the  Emergency Department and triage nurse.  Should you have questions after your visit or need to cancel or reschedule your appointment, please contact [facility] [phone]  and follow the prompts.  Office hours are 8:00 a.m. to 4:30 p.m. Monday - Friday. Please note that voicemails left after 4:00 p.m. may not be returned until the following business day.  We are closed weekends and major holidays. You have access to a nurse at all times for urgent questions. Please call the main number to the clinic [phone] and follow the prompts.  For any non-urgent questions, you may also contact your provider using MyChart. We now offer e-Visits for anyone 42 and older to request care online for non-urgent symptoms. For details visit [email or web address].   Also download the MyChart app! Go to the app store, search "MyChart", open the app, select [facility], and log in with your MyChart username and password.  Due to Covid, a mask is required upon entering the hospital/clinic. If you do not have a mask, one will be given to you upon arrival. For doctor visits, patients may have 1 support person aged 29 or older with them. For treatment visits, patients cannot have anyone with them due to current Covid guidelines and our immunocompromised population.

## 2020-10-16 NOTE — Progress Notes (Signed)
Patient presents today for Daratumumab injection per providers order.  Vital signs within parameters for treatment.  Labs pending.    Labs reviewed by Dr.Katragadda, message received patient okay for treatment.  Daratumumab administration without incident; injection site WNL; see MAR for injection details.  Patient tolerated procedure well and without incident.  No questions or complaints noted at this time.  Discharge from clinic ambulatory in stable condition.  Alert and oriented X 3.  Follow up with [facility] as scheduled.

## 2020-10-16 NOTE — Progress Notes (Signed)
Patient has been assessed, vital signs and labs have been reviewed by Dr. Katragadda. ANC, Creatinine, LFTs, and Platelets are within treatment parameters per Dr. Katragadda. The patient is good to proceed with treatment at this time. Primary RN and pharmacy aware.  

## 2020-10-17 LAB — KAPPA/LAMBDA LIGHT CHAINS
Kappa free light chain: 19.1 mg/L (ref 3.3–19.4)
Kappa, lambda light chain ratio: 0.39 (ref 0.26–1.65)
Lambda free light chains: 49.1 mg/L — ABNORMAL HIGH (ref 5.7–26.3)

## 2020-10-17 LAB — UPEP/TP, 24-HR URINE
Albumin, U: 74.5 %
Alpha 1, Urine: 7 %
Alpha 2, Urine: 6.6 %
Beta, Urine: 9.4 %
Gamma Globulin, Urine: 2.6 %
Total Protein, Urine-Ur/day: 10775 mg/24 hr — ABNORMAL HIGH (ref 30–150)
Total Protein, Urine: 663.1 mg/dL

## 2020-10-20 ENCOUNTER — Other Ambulatory Visit (HOSPITAL_COMMUNITY): Payer: Self-pay

## 2020-10-20 LAB — PROTEIN ELECTROPHORESIS, SERUM
A/G Ratio: 1 (ref 0.7–1.7)
Albumin ELP: 2.3 g/dL — ABNORMAL LOW (ref 2.9–4.4)
Alpha-1-Globulin: 0.2 g/dL (ref 0.0–0.4)
Alpha-2-Globulin: 1.2 g/dL — ABNORMAL HIGH (ref 0.4–1.0)
Beta Globulin: 0.6 g/dL — ABNORMAL LOW (ref 0.7–1.3)
Gamma Globulin: 0.3 g/dL — ABNORMAL LOW (ref 0.4–1.8)
Globulin, Total: 2.3 g/dL (ref 2.2–3.9)
Total Protein ELP: 4.6 g/dL — ABNORMAL LOW (ref 6.0–8.5)

## 2020-10-20 MED ORDER — ALPRAZOLAM 0.25 MG PO TABS: 0.2500 mg | ORAL_TABLET | Freq: Four times a day (QID) | ORAL | 0 refills | Status: DC

## 2020-11-12 NOTE — Progress Notes (Signed)
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  PRIOR THERAPY: none  NGS Results: not done  CURRENT THERAPY: DaraCyBorD & Aloxi weekly  BRIEF ONCOLOGIC HISTORY:  Oncology History  Multiple myeloma ([facility])  03/06/2020 Initial Diagnosis   Multiple myeloma ([facility])    03/06/2020 Cancer Staging   Staging form: Plasma Cell Myeloma and Plasma Cell Disorders, AJCC 8th Edition - Clinical stage from 03/06/2020: RISS Stage II (Beta-2-microglobulin (mg/L): 4.5, Albumin (g/dL): 2.5, ISS: Stage II, High-risk cytogenetics: Absent, LDH: Elevated) - Signed by Derek Jack, MD on 03/06/2020    03/20/2020 - 03/20/2020 Chemotherapy   The patient had dexamethasone (DECADRON) tablet 40 mg, 40 mg, Oral,  Once, 0 of 4 cycles bortezomib SQ (VELCADE) chemo injection (2.3m/mL concentration) 2.75 mg, 1.3 mg/m2 = 2.75 mg, Subcutaneous,  Once, 0 of 4 cycles   for chemotherapy treatment.     03/20/2020 -  Chemotherapy    Patient is on Treatment Plan: PRIMARY AMYLOIDOSIS DARACYBORD (DARATUMUMAB SQ + CYCLOPHOSPHAMIDE PO + BORTEZOMIB SQ + DEXAMETHASONE PO/IV) Q28D X 6 CYCLES / DARATUMUMAB SQ Q28D         CANCER STAGING: Cancer Staging Multiple myeloma ([facility] Staging form: Plasma Cell Myeloma and Plasma Cell Disorders, AJCC 8th Edition - Clinical stage from 03/06/2020: RISS Stage II (Beta-2-microglobulin (mg/L): 4.5, Albumin (g/dL): 2.5, ISS: Stage II, High-risk cytogenetics: Absent, LDH: Elevated) - Signed by KDerek Jack MD on 03/06/2020   INTERVAL HISTORY:  Julian Miranda a 70y.o. male, returns for routine follow-up and consideration for next cycle of chemotherapy. Julian Miranda last seen on 09/18/20.  Due for cycle #10 of DaraCyBorD & Aloxi today.   Overall, he tells me he has been feeling  pretty well. He reports a temporary episode of diarrhea since his last visit which has resolved. He denies fatigue, numbness and tingling, and any recent falls.   Overall, he feels ready for next cycle of chemo today.   REVIEW OF SYSTEMS:  Review of Systems  Constitutional:  Negative for appetite change (75%) and fatigue (80%).  Gastrointestinal:  Positive for diarrhea.  Neurological:  Negative for numbness.  All other systems reviewed and are negative.  PAST MEDICAL/SURGICAL HISTORY:  Past Medical History:  Diagnosis Date   CAD (coronary artery disease)    GERD (gastroesophageal reflux disease)    Hypertension    Myocardial infarction ([facility] 1994   Peripheral vascular disease ([facility]    Stroke ([facility]    Past Surgical History:  Procedure Laterality Date   BYPASS GRAFT POPLITEAL TO POPLITEAL Right 03/23/2018   Procedure: BYPASS GRAFT RIGHT ABOVE KNEE POPLITEAL TO BELOW KNEE POPLITEAL ARTERY  USING RIGHT GREAT SAPHENOUS VEIN;  Surgeon: CMarty Heck MD;  [facility]  Service: Vascular;  Laterality: Right;   BYPASS GRAFT POPLITEAL TO POPLITEAL Left 04/09/2019   Procedure: BYPASS  ABOVE KNEE POPLITEAL TO BELOW KNEE POPLITEAL AND LIGATION OF LEFT POPITEAL ANEURYSM;  Surgeon: CMarty Heck MD;  Location[facility]  Service: Vascular;  Laterality: Left;   COLONOSCOPY WITH ESOPHAGOGASTRODUODENOSCOPY (EGD)     CORONARY ARTERY BYPASS GRAFT     EYE SURGERY Bilateral    cataracts   FEMORAL BYPASS Right 03/23/2018   HERNIA REPAIR     IR IMAGING GUIDED PORT INSERTION  03/25/2020   MASTOIDECTOMY  ROTATOR CUFF REPAIR Right    TONSILLECTOMY     VEIN HARVEST Right 03/23/2018   Procedure: VEIN HARVEST RIGHT GREAT SAPHENOUS;  Surgeon: Marty Heck, MD;  Location: [facility] OR;  Service: Vascular;  Laterality: Right;   VEIN HARVEST Left 04/09/2019   Procedure: Harvest Greater Saphenous Vein and  Revise Elisa Lateral Saphenous Vein ;  Surgeon: Marty Heck, MD;  [facility];  Service: Vascular;  Laterality: Left;    SOCIAL HISTORY:  Social History   Socioeconomic History   Marital status: Married    Spouse name: Not on file   Number of children: Not on file   Years of education: Not on file   Highest education level: Not on file  Occupational History   Not on file  Tobacco Use   Smoking status: Never   Smokeless tobacco: Never  Vaping Use   Vaping Use: Never used  Substance and Sexual Activity   Alcohol use: Never   Drug use: Never   Sexual activity: Not on file  Other Topics Concern   Not on file  Social History Narrative   Not on file   Social Determinants of Health   Financial Resource Strain: Low Risk    Difficulty of Paying Living Expenses: Not hard at all  Food Insecurity: No Food Insecurity   Worried About Running Out of Food in the Last Year: Never true   [place] in the Last Year: Never true  Transportation Needs: No Transportation Needs   Lack of Transportation (Medical): No   Lack of Transportation (Non-Medical): No  Physical Activity: Insufficiently Active   Days of Exercise per Week: 2 days   Minutes of Exercise per Session: 20 min  Stress: Stress Concern Present   Feeling of Stress : To some extent  Social Connections: Moderately Integrated   Frequency of Communication with Friends and Family: More than three times a week   Frequency of Social Gatherings with Friends and Family: Twice a week   Attends Religious Services: More than 4 times per year   Active Member of [employer] or Organizations: No   Attends Music therapist: Never   Marital Status: Married  Human resources officer Violence: Not At Risk   Fear of Current or Ex-Partner: No   Emotionally Abused: No   Physically Abused: No   Sexually Abused: No    FAMILY HISTORY:  Family History  Problem Relation Age of Onset   Heart disease Mother     CURRENT MEDICATIONS:  Current Outpatient Medications  Medication Sig Dispense Refill    acetaminophen (TYLENOL) 500 MG tablet Take 500 mg by mouth 2 (two) times daily.     acyclovir (ZOVIRAX) 400 MG tablet Take 1 tablet (400 mg total) by mouth 2 (two) times daily. 60 tablet 5   ALPRAZolam (XANAX) 0.25 MG tablet Take 1 tablet (0.25 mg total) by mouth in the morning, at noon, in the evening, and at bedtime. 120 tablet 0   aspirin 81 MG tablet Take 81 mg by mouth daily.     atorvastatin (LIPITOR) 80 MG tablet Take 80 mg by mouth at bedtime.      carvedilol (COREG) 25 MG tablet Take 25 mg by mouth daily.      chlorproMAZINE (THORAZINE) 25 MG tablet Take 1 tablet (25 mg total) by mouth 4 (four) times daily as needed. For hiccups (Patient not taking: Reported on 10/16/2020) 60 tablet 2   dexamethasone (DECADRON) 4 MG tablet Take 74m (5  tabs) one hour prior to injection appointment every 28 days 5 tablet 11   diazepam (VALIUM) 2 MG tablet Take 1 tablet (2 mg total) by mouth daily as needed (dizziness). (Patient not taking: Reported on 10/16/2020) 30 tablet 0   ergocalciferol (VITAMIN D2) 1.25 MG (50000 UT) capsule Take 1 capsule (50,000 Units total) by mouth once a week. 8 capsule 3   gabapentin (NEURONTIN) 300 MG capsule Take 1 capsule (300 mg total) by mouth daily. 30 capsule 6   HYDROcodone-acetaminophen (NORCO) 5-325 MG tablet Take 1 tablet by mouth every 8 (eight) hours as needed for moderate pain. 60 tablet 0   icosapent Ethyl (VASCEPA) 1 g capsule Take 1 g by mouth 2 (two) times daily.     Iron-Vitamin C (VITRON-C) 65-125 MG TABS Take 1 tablet by mouth daily. 60 tablet 3   lidocaine (XYLOCAINE) 2 % solution Use as directed 15 mLs in the mouth or throat as needed for mouth pain. (Patient not taking: Reported on 10/16/2020) 15 mL 3   lidocaine-prilocaine (EMLA) cream Apply 1 application topically as needed. Apply to portacath as needed 30 g 0   magnesium oxide (MAG-OX) 400 (241.3 Mg) MG tablet Take 1 tablet (400 mg total) by mouth daily. 30 tablet 0   magnesium oxide (MAG-OX) 400 MG tablet  Take 1 tablet (400 mg total) by mouth daily. 30 tablet 3   meclizine (ANTIVERT) 25 MG tablet Take 1 tablet (25 mg total) by mouth 2 (two) times daily as needed for dizziness. (Patient not taking: Reported on 10/16/2020) 30 tablet 3   megestrol (MEGACE) 400 MG/10ML suspension Take 10 mLs (400 mg total) by mouth 2 (two) times daily. 480 mL 2   Multiple Minerals (CALCIUM/MAGNESIUM/ZINC PO) Take 1 tablet by mouth at bedtime.     omeprazole (PRILOSEC) 20 MG capsule Take 20 mg by mouth 2 (two) times daily.      ondansetron (ZOFRAN) 4 MG tablet Take 4 mg by mouth every 8 (eight) hours as needed for nausea.     potassium chloride (KLOR-CON) 10 MEQ tablet Take 1 tablet (10 mEq total) by mouth 2 (two) times daily. 90 tablet 3   prochlorperazine (COMPAZINE) 10 MG tablet Take 1 tablet (10 mg total) by mouth every 6 (six) hours as needed for nausea or vomiting. 60 tablet 3   sacubitril-valsartan (ENTRESTO) 24-26 MG Take 1 tablet by mouth 2 (two) times daily.     scopolamine (TRANSDERM-SCOP) 1 MG/3DAYS      sildenafil (REVATIO) 20 MG tablet Take 20 mg by mouth daily.     spironolactone (ALDACTONE) 25 MG tablet Take 25 mg by mouth daily.     torsemide (DEMADEX) 20 MG tablet Take 20 mg by mouth 2 (two) times daily.     traMADol (ULTRAM) 50 MG tablet TAKE ONE TABLET BY MOUTH EVERY 8 HOURS AS NEEDED FOR SEVERE PAIN 60 tablet 3   vitamin B-12 (CYANOCOBALAMIN) 1000 MCG tablet Take 1,000 mcg by mouth daily.     zinc gluconate 50 MG tablet Take 50 mg by mouth daily.      zolpidem (AMBIEN) 10 MG tablet Take 1 tablet (10 mg total) by mouth at bedtime as needed for sleep. 20 tablet 0   No current facility-administered medications for this visit.    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 No associated hemodynamic or respiratory changes. Please give slowly.    PHYSICAL EXAM:  Performance status (ECOG): 1 - Symptomatic but  completely ambulatory  There were no vitals filed for this visit. Wt Readings from Last 3 Encounters:  10/16/20 178 lb (80.7 kg)  09/18/20 180 lb 6.4 oz (81.8 kg)  08/28/20 198 lb 9.6 oz (90.1 kg)   Physical Exam Vitals reviewed.  Constitutional:      Appearance: Normal appearance.  Cardiovascular:     Rate and Rhythm: Normal rate and regular rhythm.     Pulses: Normal pulses.     Heart sounds: Normal heart sounds.  Pulmonary:     Effort: Pulmonary effort is normal.     Breath sounds: Normal breath sounds.  Musculoskeletal:     Right lower leg: No edema.     Left lower leg: No edema.  Neurological:     General: No focal deficit present.     Mental Status: He is alert and oriented to person, place, and time.  Psychiatric:        Mood and Affect: Mood normal.        Behavior: Behavior normal.    LABORATORY DATA:  I have reviewed the labs as listed.  CBC Latest Ref Rng & Units 10/16/2020 09/18/2020 09/04/2020  WBC 4.0 - 10.5 K/uL 11.9(H) 12.3(H) 11.0(H)  Hemoglobin 13.0 - 17.0 g/dL 11.3(L) 11.5(L) 10.6(L)  Hematocrit 39.0 - 52.0 % 35.2(L) 35.9(L) 33.3(L)  Platelets 150 - 400 K/uL 265 272 171   CMP Latest Ref Rng & Units 10/16/2020 09/18/2020 09/04/2020  Glucose 70 - 99 mg/dL 145(H) 97 93  BUN 8 - 23 mg/dL 30(H) 27(H) 18  Creatinine 0.61 - 1.24 mg/dL 2.36(H) 1.69(H) 1.29(H)  Sodium 135 - 145 mmol/L 139 138 137  Potassium 3.5 - 5.1 mmol/L 3.7 4.0 4.0  Chloride 98 - 111 mmol/L 102 100 101  CO2 22 - 32 mmol/L 28 31 30   Calcium 8.9 - 10.3 mg/dL 8.3(L) 8.5(L) 8.1(L)  Total Protein 6.5 - 8.1 g/dL 5.4(L) 5.6(L) 4.6(L)  Total Bilirubin 0.3 - 1.2 mg/dL 0.5 0.2(L) 0.5  Alkaline Phos 38 - 126 U/L 97 121 110  AST 15 - 41 U/L 13(L) 12(L) 11(L)  ALT 0 - 44 U/L 9 9 9     DIAGNOSTIC IMAGING:  I have independently reviewed the scans and discussed with the patient. No results found.   ASSESSMENT:  1.  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 Calton Dach -Dr. Joylene Grapes at [facility]-SPEP 0.2 g, immunofixation with biclonal IgG with lambda specificity. -Free kappa light chain 16.7, lambda light chains 122.7, ratio 0.14. -Kidney biopsy on 02/04/2020 with AL amyloidosis, lambda immunophenotype, Congo red stain positive.  Immunofluorescence microscopy shows dense homogeneous glomerular mesangial staining with antisera specific for IgG 1-2+, IgM 2+, C1q 2+, lambda light chains 3+.  Waxy homogeneous staining noted along the blood vessels.  Congo red stain demonstrates positive apocrine birefringence within the glomerular mesangial regions and along blood vessel walls. -48 pound weight loss since summer 2021 with decreased appetite.  Positive for fatigue. -Reports occasional nausea, no vomiting.  Positive for occipital headaches. -Denies any tingling or numbness in the extremities.  He has cramps in the left arm which is new.  He had leg cramps for a long time. -Cardiac MRI on 02/29/2020 with EF 38%, no evidence of amyloid deposits in the myocardium. -PET scan on 02/26/2020 with scattered borderline mediastinal and hilar lymph nodes with low-level hypermetabolism most likely inflammatory or reactive.  No bone abnormalities.  No findings in the abdomen or pelvis. -Bone marrow biopsy on 02/26/2020 with slightly hypercellular bone marrow with 23% cells consistent with plasma cells, staining for lambda light chain.  No lymphoproliferative disorder.  Congo red stain was negative. -Bone marrow cytogenetics were normal.  FISH panel for multiple myeloma was negative. -M spike was 0.1 g, immunofixation shows IgG lambda.  LDH was elevated at 216.  Beta-2 microglobulin 4.5. - 6 cycles of Dara CyBorD from 03/20/2020 through 09/04/2020.  Currently on maintenance daratumumab.   2.  Social/family history: -He works for [employer] and retired 5 years ago. Never smoker. -Maternal aunt had cancer in male  organs.  One maternal first cousin had appendiceal cancer, another maternal first cousin had cancer of the digestive system.   PLAN:  1.  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  He is receiving Decadron 20 mg in the premeds. - Reviewed labs today which showed improved creatinine of 1.78.  Myeloma labs showed negative M spike.  CBC was grossly normal except mild anemia.  Lambda light chains are 35. - We reviewed results of 24-hour urine from 10/15/2020 which showed improvement in total protein of 10.7 g, down from 17 g. - We will proceed with next cycle of maintenance daratumumab today.  Continue acyclovir twice daily for infection prophylaxis. - RTC 4 weeks for follow-up.  We will check 24-hour urine at next visit.    2.  Leg pains: - Continue tramadol for mild pains and hydrocodone for moderate to severe pain.  Overall well controlled.   3.  Leg weakness/pains: - Left sural nerve biopsy on 07/02/2020 did not show any evidence of amyloidosis.  Some demyelination consistent with neuropathy. - Leg weakness continues to improve since we switched to maintenance therapy.   4.  Elevated creatinine: - Creatinine today is improved to 1.78 from 2.36 at last visit after we cut back on torsemide dose..   5.  Loss of appetite: - His weight is 183 pounds and stable.  He is not requiring any appetite stimulants.  He is eating well.   6.  Lower extremity swelling: - Continue torsemide 20 mg daily and spironolactone 25 mg daily. - No worsening in the leg swelling noted.   Orders placed this encounter:  No orders of the defined types were placed in this encounter.    Derek Jack, MD [facility] [phone]   I, Thana Ates, am acting as a scribe for Dr. Derek Jack.  I, Derek Jack MD, have reviewed the above documentation for  accuracy and completeness, and I agree with the above.

## 2020-11-13 ENCOUNTER — Inpatient Hospital Stay (HOSPITAL_COMMUNITY): Payer: Medicare Other

## 2020-11-13 ENCOUNTER — Other Ambulatory Visit: Payer: Self-pay

## 2020-11-13 ENCOUNTER — Inpatient Hospital Stay (HOSPITAL_BASED_OUTPATIENT_CLINIC_OR_DEPARTMENT_OTHER): Payer: Medicare Other | Admitting: Hematology

## 2020-11-13 ENCOUNTER — Inpatient Hospital Stay (HOSPITAL_COMMUNITY): Payer: Medicare Other | Attending: Hematology

## 2020-11-13 ENCOUNTER — Other Ambulatory Visit (HOSPITAL_COMMUNITY): Payer: Self-pay | Admitting: *Deleted

## 2020-11-13 DIAGNOSIS — K219 Gastro-esophageal reflux disease without esophagitis: Secondary | ICD-10-CM | POA: Diagnosis not present

## 2020-11-13 DIAGNOSIS — C9000 Multiple myeloma not having achieved remission: Secondary | ICD-10-CM

## 2020-11-13 DIAGNOSIS — I739 Peripheral vascular disease, unspecified: Secondary | ICD-10-CM | POA: Insufficient documentation

## 2020-11-13 DIAGNOSIS — R7989 Other specified abnormal findings of blood chemistry: Secondary | ICD-10-CM | POA: Diagnosis not present

## 2020-11-13 DIAGNOSIS — G629 Polyneuropathy, unspecified: Secondary | ICD-10-CM | POA: Diagnosis not present

## 2020-11-13 DIAGNOSIS — Z7982 Long term (current) use of aspirin: Secondary | ICD-10-CM | POA: Diagnosis not present

## 2020-11-13 DIAGNOSIS — E8581 Light chain (AL) amyloidosis: Secondary | ICD-10-CM | POA: Diagnosis not present

## 2020-11-13 DIAGNOSIS — Z79899 Other long term (current) drug therapy: Secondary | ICD-10-CM | POA: Insufficient documentation

## 2020-11-13 DIAGNOSIS — I10 Essential (primary) hypertension: Secondary | ICD-10-CM | POA: Diagnosis not present

## 2020-11-13 DIAGNOSIS — D649 Anemia, unspecified: Secondary | ICD-10-CM | POA: Insufficient documentation

## 2020-11-13 DIAGNOSIS — R630 Anorexia: Secondary | ICD-10-CM | POA: Insufficient documentation

## 2020-11-13 DIAGNOSIS — R197 Diarrhea, unspecified: Secondary | ICD-10-CM | POA: Diagnosis not present

## 2020-11-13 DIAGNOSIS — Z5111 Encounter for antineoplastic chemotherapy: Secondary | ICD-10-CM | POA: Diagnosis present

## 2020-11-13 DIAGNOSIS — M7989 Other specified soft tissue disorders: Secondary | ICD-10-CM | POA: Diagnosis not present

## 2020-11-13 DIAGNOSIS — I252 Old myocardial infarction: Secondary | ICD-10-CM | POA: Insufficient documentation

## 2020-11-13 DIAGNOSIS — Z8673 Personal history of transient ischemic attack (TIA), and cerebral infarction without residual deficits: Secondary | ICD-10-CM | POA: Insufficient documentation

## 2020-11-13 DIAGNOSIS — I2510 Atherosclerotic heart disease of native coronary artery without angina pectoris: Secondary | ICD-10-CM | POA: Insufficient documentation

## 2020-11-13 LAB — COMPREHENSIVE METABOLIC PANEL
ALT: 11 U/L (ref 0–44)
AST: 14 U/L — ABNORMAL LOW (ref 15–41)
Albumin: 2.4 g/dL — ABNORMAL LOW (ref 3.5–5.0)
Alkaline Phosphatase: 89 U/L (ref 38–126)
Anion gap: 3 — ABNORMAL LOW (ref 5–15)
BUN: 28 mg/dL — ABNORMAL HIGH (ref 8–23)
CO2: 26 mmol/L (ref 22–32)
Calcium: 8.5 mg/dL — ABNORMAL LOW (ref 8.9–10.3)
Chloride: 106 mmol/L (ref 98–111)
Creatinine, Ser: 1.78 mg/dL — ABNORMAL HIGH (ref 0.61–1.24)
GFR, Estimated: 41 mL/min — ABNORMAL LOW (ref >60)
Glucose, Bld: 103 mg/dL — ABNORMAL HIGH (ref 70–99)
Potassium: 4.1 mmol/L (ref 3.5–5.1)
Sodium: 135 mmol/L (ref 135–145)
Total Bilirubin: 0.6 mg/dL (ref 0.3–1.2)
Total Protein: 5.2 g/dL — ABNORMAL LOW (ref 6.5–8.1)

## 2020-11-13 LAB — CBC WITH DIFFERENTIAL/PLATELET
Abs Immature Granulocytes: 0.05 10*3/uL (ref 0.00–0.07)
Basophils Absolute: 0.1 10*3/uL (ref 0.0–0.1)
Basophils Relative: 0 %
Eosinophils Absolute: 0.4 10*3/uL (ref 0.0–0.5)
Eosinophils Relative: 4 %
HCT: 35.3 % — ABNORMAL LOW (ref 39.0–52.0)
Hemoglobin: 11.3 g/dL — ABNORMAL LOW (ref 13.0–17.0)
Immature Granulocytes: 0 %
Lymphocytes Relative: 16 %
Lymphs Abs: 1.8 10*3/uL (ref 0.7–4.0)
MCH: 31.9 pg (ref 26.0–34.0)
MCHC: 32 g/dL (ref 30.0–36.0)
MCV: 99.7 fL (ref 80.0–100.0)
Monocytes Absolute: 0.7 10*3/uL (ref 0.1–1.0)
Monocytes Relative: 6 %
Neutro Abs: 8.4 10*3/uL — ABNORMAL HIGH (ref 1.7–7.7)
Neutrophils Relative %: 74 %
Platelets: 253 10*3/uL (ref 150–400)
RBC: 3.54 MIL/uL — ABNORMAL LOW (ref 4.22–5.81)
RDW: 15.9 % — ABNORMAL HIGH (ref 11.5–15.5)
WBC: 11.3 10*3/uL — ABNORMAL HIGH (ref 4.0–10.5)
nRBC: 0 % (ref 0.0–0.2)

## 2020-11-13 LAB — MAGNESIUM: Magnesium: 1.6 mg/dL — ABNORMAL LOW (ref 1.7–2.4)

## 2020-11-13 MED ORDER — DEXAMETHASONE 4 MG PO TABS: ORAL_TABLET | ORAL | 11 refills | Status: DC

## 2020-11-13 MED ORDER — SODIUM CHLORIDE 0.9% FLUSH
10.0000 mL | Freq: Once | INTRAVENOUS | Status: AC
  Administered 2020-11-13: 10 mL via INTRAVENOUS

## 2020-11-13 MED ORDER — HEPARIN SOD (PORK) LOCK FLUSH 100 UNIT/ML IV SOLN
500.0000 [IU] | Freq: Once | INTRAVENOUS | Status: AC
  Administered 2020-11-13: 500 [IU] via INTRAVENOUS

## 2020-11-13 MED ORDER — TRAMADOL HCL 50 MG PO TABS: ORAL_TABLET | ORAL | 0 refills | Status: DC

## 2020-11-13 MED ORDER — DARATUMUMAB-HYALURONIDASE-FIHJ 1800-30000 MG-UT/15ML ~~LOC~~ SOLN
1800.0000 mg | Freq: Once | SUBCUTANEOUS | Status: AC
  Administered 2020-11-13: 1800 mg via SUBCUTANEOUS
  Filled 2020-11-13: qty 15

## 2020-11-13 MED ORDER — HYDROCODONE-ACETAMINOPHEN 5-325 MG PO TABS: 1.0000 | ORAL_TABLET | Freq: Three times a day (TID) | ORAL | 0 refills | Status: DC | PRN

## 2020-11-13 NOTE — Patient Instructions (Addendum)
[facility] at [facility] Discharge Instructions  You were seen today by Dr. Delton Coombes. He went over your recent results, and your received your treatment. Dr. Delton Coombes will see you back in 1 month for labs and follow up.   Thank you for choosing [facility] at [facility] to provide your oncology and hematology care.  To afford each patient quality time with our provider, please arrive at least 15 minutes before your scheduled appointment time.   If you have a lab appointment with the [facility] please come in thru the Main Entrance and check in at the main information desk  You need to re-schedule your appointment should you arrive 10 or more minutes late.  We strive to give you quality time with our providers, and arriving late affects you and other patients whose appointments are after yours.  Also, if you no show three or more times for appointments you may be dismissed from the clinic at the providers discretion.     Again, thank you for choosing [facility]  Our hope is that these requests will decrease the amount of time that you wait before being seen by our physicians.       _____________________________________________________________  Should you have questions after your visit to [facility], please contact our office at [phone] between the hours of 8:00 a.m. and 4:30 p.m.  Voicemails left after 4:00 p.m. will not be returned until the following business day.  For prescription refill requests, have your pharmacy contact our office and allow 72 hours.    Cancer Center Support Programs:   > Cancer Support Group  2nd Tuesday of the month 1pm-2pm, Journey Room

## 2020-11-13 NOTE — Progress Notes (Signed)
Patient has been assessed, vital signs and labs have been reviewed by Dr. Katragadda. ANC, Creatinine, LFTs, and Platelets are within treatment parameters per Dr. Katragadda. The patient is good to proceed with treatment at this time. Primary RN and pharmacy aware.  

## 2020-11-13 NOTE — Patient Instructions (Signed)
[facility]  Discharge Instructions: Thank you for choosing [facility] to provide your oncology and hematology care.  If you have a lab appointment with the [facility], please come in thru the Main Entrance and check in at the main information desk.  Wear comfortable clothing and clothing appropriate for easy access to any Portacath or PICC line.   We strive to give you quality time with your provider. You may need to reschedule your appointment if you arrive late (15 or more minutes).  Arriving late affects you and other patients whose appointments are after yours.  Also, if you miss three or more appointments without notifying the office, you may be dismissed from the clinic at the provider's discretion.      For prescription refill requests, have your pharmacy contact our office and allow 72 hours for refills to be completed.    Today you received the following chemotherapy and/or immunotherapy agents Daratumumab injection      To help prevent nausea and vomiting after your treatment, we encourage you to take your nausea medication as directed.  BELOW ARE SYMPTOMS THAT SHOULD BE REPORTED IMMEDIATELY: *FEVER GREATER THAN 100.4 F (38 C) OR HIGHER *CHILLS OR SWEATING *NAUSEA AND VOMITING THAT IS NOT CONTROLLED WITH YOUR NAUSEA MEDICATION *UNUSUAL SHORTNESS OF BREATH *UNUSUAL BRUISING OR BLEEDING *URINARY PROBLEMS (pain or burning when urinating, or frequent urination) *BOWEL PROBLEMS (unusual diarrhea, constipation, pain near the anus) TENDERNESS IN MOUTH AND THROAT WITH OR WITHOUT PRESENCE OF ULCERS (sore throat, sores in mouth, or a toothache) UNUSUAL RASH, SWELLING OR PAIN  UNUSUAL VAGINAL DISCHARGE OR ITCHING   Items with * indicate a potential emergency and should be followed up as soon as possible or go to the Emergency Department if any problems should occur.  Please show the CHEMOTHERAPY ALERT CARD or IMMUNOTHERAPY ALERT CARD at check-in to the  Emergency Department and triage nurse.  Should you have questions after your visit or need to cancel or reschedule your appointment, please contact [facility] [phone]  and follow the prompts.  Office hours are 8:00 a.m. to 4:30 p.m. Monday - Friday. Please note that voicemails left after 4:00 p.m. may not be returned until the following business day.  We are closed weekends and major holidays. You have access to a nurse at all times for urgent questions. Please call the main number to the clinic [phone] and follow the prompts.  For any non-urgent questions, you may also contact your provider using MyChart. We now offer e-Visits for anyone 76 and older to request care online for non-urgent symptoms. For details visit [email or web address].   Also download the MyChart app! Go to the app store, search "MyChart", open the app, select [facility], and log in with your MyChart username and password.  Due to Covid, a mask is required upon entering the hospital/clinic. If you do not have a mask, one will be given to you upon arrival. For doctor visits, patients may have 1 support person aged 83 or older with them. For treatment visits, patients cannot have anyone with them due to current Covid guidelines and our immunocompromised population.

## 2020-11-13 NOTE — Progress Notes (Signed)
Patient presents today for Daratumumab injection per providers order.  Vital signs within parameters for treatment.  Labs pending.  Patient states that he took his premedications (Tylenol,Benadryl,and Dexamethasone) prior to coming into the clinic this morning.  No new complaints at this time.  Message received from [facility] RN/Dr. Delton Coombes patient okay for treatment.  Daratumumab administration without incident; injection site WNL; see MAR for injection details.  Patient tolerated procedure well and without incident.  No questions or complaints noted at this time.  Discharge from clinic ambulatory in stable condition.  Alert and oriented X 3.  Follow up with [facility] as scheduled.

## 2020-11-14 LAB — KAPPA/LAMBDA LIGHT CHAINS
Kappa free light chain: 14.2 mg/L (ref 3.3–19.4)
Kappa, lambda light chain ratio: 0.4 (ref 0.26–1.65)
Lambda free light chains: 35.1 mg/L — ABNORMAL HIGH (ref 5.7–26.3)

## 2020-11-15 LAB — PROTEIN ELECTROPHORESIS, SERUM
A/G Ratio: 1.1 (ref 0.7–1.7)
Albumin ELP: 2.5 g/dL — ABNORMAL LOW (ref 2.9–4.4)
Alpha-1-Globulin: 0.2 g/dL (ref 0.0–0.4)
Alpha-2-Globulin: 1.2 g/dL — ABNORMAL HIGH (ref 0.4–1.0)
Beta Globulin: 0.6 g/dL — ABNORMAL LOW (ref 0.7–1.3)
Gamma Globulin: 0.3 g/dL — ABNORMAL LOW (ref 0.4–1.8)
Globulin, Total: 2.3 g/dL (ref 2.2–3.9)
Total Protein ELP: 4.8 g/dL — ABNORMAL LOW (ref 6.0–8.5)

## 2020-11-16 ENCOUNTER — Encounter (HOSPITAL_COMMUNITY): Payer: Self-pay | Admitting: Hematology and Oncology

## 2020-11-19 ENCOUNTER — Other Ambulatory Visit (HOSPITAL_COMMUNITY): Payer: Self-pay

## 2020-11-19 ENCOUNTER — Other Ambulatory Visit (HOSPITAL_COMMUNITY): Payer: Self-pay | Admitting: Physician Assistant

## 2020-11-19 MED ORDER — ALPRAZOLAM 0.25 MG PO TABS: 0.2500 mg | ORAL_TABLET | Freq: Four times a day (QID) | ORAL | 0 refills | Status: DC

## 2020-12-10 ENCOUNTER — Other Ambulatory Visit (HOSPITAL_COMMUNITY): Payer: Self-pay | Admitting: *Deleted

## 2020-12-10 DIAGNOSIS — C9000 Multiple myeloma not having achieved remission: Secondary | ICD-10-CM

## 2020-12-10 DIAGNOSIS — E8581 Light chain (AL) amyloidosis: Secondary | ICD-10-CM

## 2020-12-10 NOTE — Progress Notes (Signed)
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  PRIOR THERAPY: none  NGS Results: not done  CURRENT THERAPY: DaraCyBorD & Aloxi weekly  BRIEF ONCOLOGIC HISTORY:  Oncology History  Multiple myeloma ([place])  03/06/2020 Initial Diagnosis   Multiple myeloma ([name])   03/06/2020 Cancer Staging   Staging form: Plasma Cell Myeloma and Plasma Cell Disorders, AJCC 8th Edition - Clinical stage from 03/06/2020: RISS Stage II (Beta-2-microglobulin (mg/L): 4.5, Albumin (g/dL): 2.5, ISS: Stage II, High-risk cytogenetics: Absent, LDH: Elevated) - Signed by Derek Jack, MD on 03/06/2020   03/20/2020 - 03/20/2020 Chemotherapy   The patient had dexamethasone (DECADRON) tablet 40 mg, 40 mg, Oral,  Once, 0 of 4 cycles bortezomib SQ (VELCADE) chemo injection (2.52m/mL concentration) 2.75 mg, 1.3 mg/m2 = 2.75 mg, Subcutaneous,  Once, 0 of 4 cycles   for chemotherapy treatment.     03/20/2020 -  Chemotherapy    Patient is on Treatment Plan: PRIMARY AMYLOIDOSIS DARACYBORD (DARATUMUMAB SQ + CYCLOPHOSPHAMIDE PO + BORTEZOMIB SQ + DEXAMETHASONE PO/IV) Q28D X 6 CYCLES / DARATUMUMAB SQ Q28D         CANCER STAGING: Cancer Staging Multiple myeloma ([place] Staging form: Plasma Cell Myeloma and Plasma Cell Disorders, AJCC 8th Edition - Clinical stage from 03/06/2020: RISS Stage II (Beta-2-microglobulin (mg/L): 4.5, Albumin (g/dL): 2.5, ISS: Stage II, High-risk cytogenetics: Absent, LDH: Elevated) - Signed by Julian Jack MD on 03/06/2020   INTERVAL HISTORY:  [name] a 70y.o. male, returns for routine follow-up and consideration for next cycle of chemotherapy. TJosiawas last seen on 11/13/2020.  Due for cycle #11 of DARZALEX FASPRO today.   Overall, he tells me he has been feeling  pretty well. He reports good appetite and indigestion over the past 6 weeks. He denies tingling/numbness and nausea. He denies abdominal pain and reports regular BM. He stopped spirolactone last week. He has been taking Prilosec for 20 years.   Overall, he feels ready for next cycle of chemo today.   REVIEW OF SYSTEMS:  Review of Systems  Constitutional:  Negative for appetite change and fatigue (90%).  Gastrointestinal:  Negative for abdominal pain, constipation and diarrhea.       Indigestion  Neurological:  Positive for dizziness.  All other systems reviewed and are negative.  PAST MEDICAL/SURGICAL HISTORY:  Past Medical History:  Diagnosis Date   CAD (coronary artery disease)    GERD (gastroesophageal reflux disease)    Hypertension    Myocardial infarction ([facility] 1994   [place]ipheral vascular disease ([place]    Stroke ([facility]    Past Surgical History:  Procedure Laterality Date   BYPASS GRAFT POPLITEAL TO POPLITEAL Right 03/23/2018   Procedure: BYPASS GRAFT RIGHT ABOVE KNEE POPLITEAL TO BELOW KNEE POPLITEAL ARTERY  USING RIGHT GREAT SAPHENOUS VEIN;  Surgeon: CMarty Heck MD;  [place] [facility]  Service: Vascular;  Laterality: Right;   BYPASS GRAFT POPLITEAL TO POPLITEAL Left 04/09/2019   Procedure: BYPASS  ABOVE KNEE POPLITEAL TO BELOW KNEE POPLITEAL AND LIGATION OF LEFT POPITEAL ANEURYSM;  Surgeon: CMarty Heck MD;  [place]  Service: Vascular;  Laterality: Left;   COLONOSCOPY WITH ESOPHAGOGASTRODUODENOSCOPY (EGD)     CORONARY ARTERY BYPASS GRAFT     EYE SURGERY Bilateral    cataracts   FEMORAL BYPASS Right 03/23/2018  HERNIA REPAIR     IR IMAGING GUIDED PORT INSERTION  03/25/2020   MASTOIDECTOMY     ROTATOR CUFF REPAIR Right    TONSILLECTOMY     VEIN HARVEST Right 03/23/2018   Procedure: VEIN HARVEST RIGHT GREAT SAPHENOUS;  Surgeon: Julian Heck, MD;  [facility];  Service: Vascular;  Laterality: Right;   VEIN HARVEST Left 04/09/2019    Procedure: Harvest Greater Saphenous Vein and  Revise Elisa Lateral Saphenous Vein ;  Surgeon: Julian Heck, MD;  Location: [facility] OR;  Service: Vascular;  Laterality: Left;    SOCIAL HISTORY:  Social History   Socioeconomic History   Marital status: Married    Spouse name: Not on file   Number of children: Not on file   Years of education: Not on file   Highest education level: Not on file  Occupational History   Not on file  Tobacco Use   Smoking status: Never   Smokeless tobacco: Never  Vaping Use   Vaping Use: Never used  Substance and Sexual Activity   Alcohol use: Never   Drug use: Never   Sexual activity: Not on file  Other Topics Concern   Not on file  Social History Narrative   Not on file   Social Determinants of Health   Financial Resource Strain: Low Risk    Difficulty of Paying Living Expenses: Not hard at all  Food Insecurity: No Food Insecurity   Worried About Estate manager/land agent of Food in the Last Year: Never true   [place] in the Last Year: Never true  Transportation Needs: No Transportation Needs   Lack of Transportation (Medical): No   Lack of Transportation (Non-Medical): No  Physical Activity: Insufficiently Active   Days of Exercise per Week: 2 days   Minutes of Exercise per Session: 20 min  Stress: Stress Concern Present   Feeling of Stress : To some extent  Social Connections: Moderately Integrated   Frequency of Communication with Friends and Family: More than three times a week   Frequency of Social Gatherings with Friends and Family: Twice a week   Attends Religious Services: More than 4 times per year   Active Member of [employer] or Organizations: No   Attends Music therapist: Never   Marital Status: Married  Human resources officer Violence: Not At Risk   Fear of Current or Ex-Partner: No   Emotionally Abused: No   Physically Abused: No   Sexually Abused: No    FAMILY HISTORY:  Family History  Problem Relation Age of  Onset   Heart disease Mother     CURRENT MEDICATIONS:  Current Outpatient Medications  Medication Sig Dispense Refill   acetaminophen (TYLENOL) 500 MG tablet Take 500 mg by mouth 2 (two) times daily.     acyclovir (ZOVIRAX) 400 MG tablet Take 1 tablet (400 mg total) by mouth 2 (two) times daily. 60 tablet 5   ALPRAZolam (XANAX) 0.25 MG tablet Take 1 tablet (0.25 mg total) by mouth in the morning, at noon, in the evening, and at bedtime. 120 tablet 0   ALPRAZolam (XANAX) 0.25 MG tablet TAKE ONE TABLET BY MOUTH EVERY MORNING TAKE ONE TABLET BY MOUTH DAILY AT NOON TAKE ONE TABLET BY MOUTH EVERY EVENING AND TAKE ONE TABLET BY MOUTH EVERY NIGHT AT BEDTIME 120 tablet 0   aspirin 81 MG tablet Take 81 mg by mouth daily.     atorvastatin (LIPITOR) 80 MG tablet Take 80 mg by mouth at  bedtime.      carvedilol (COREG) 25 MG tablet Take 25 mg by mouth daily.      chlorproMAZINE (THORAZINE) 25 MG tablet Take 1 tablet (25 mg total) by mouth 4 (four) times daily as needed. For hiccups 60 tablet 2   dexamethasone (DECADRON) 4 MG tablet Take 28m (5 tabs) one hour prior to injection appointment every 28 days 5 tablet 11   diazepam (VALIUM) 2 MG tablet Take 1 tablet (2 mg total) by mouth daily as needed (dizziness). 30 tablet 0   ergocalciferol (VITAMIN D2) 1.25 MG (50000 UT) capsule Take 1 capsule (50,000 Units total) by mouth once a week. 8 capsule 3   gabapentin (NEURONTIN) 300 MG capsule Take 1 capsule (300 mg total) by mouth daily. 30 capsule 6   HYDROcodone-acetaminophen (NORCO) 5-325 MG tablet Take 1 tablet by mouth every 8 (eight) hours as needed for moderate pain. 60 tablet 0   icosapent Ethyl (VASCEPA) 1 g capsule Take 1 g by mouth 2 (two) times daily.     Iron-Vitamin C (VITRON-C) 65-125 MG TABS Take 1 tablet by mouth daily. 60 tablet 3   lidocaine (XYLOCAINE) 2 % solution Use as directed 15 mLs in the mouth or throat as needed for mouth pain. 15 mL 3   lidocaine-prilocaine (EMLA) cream Apply 1  application topically as needed. Apply to portacath as needed 30 g 0   magnesium oxide (MAG-OX) 400 (241.3 Mg) MG tablet Take 1 tablet (400 mg total) by mouth daily. 30 tablet 0   magnesium oxide (MAG-OX) 400 MG tablet Take 1 tablet (400 mg total) by mouth daily. 30 tablet 3   meclizine (ANTIVERT) 25 MG tablet Take 1 tablet (25 mg total) by mouth 2 (two) times daily as needed for dizziness. 30 tablet 3   megestrol (MEGACE) 400 MG/10ML suspension Take 10 mLs (400 mg total) by mouth 2 (two) times daily. 480 mL 2   Multiple Minerals (CALCIUM/MAGNESIUM/ZINC PO) Take 1 tablet by mouth at bedtime.     omeprazole (PRILOSEC) 20 MG capsule Take 20 mg by mouth 2 (two) times daily.      ondansetron (ZOFRAN) 4 MG tablet Take 4 mg by mouth every 8 (eight) hours as needed for nausea.     potassium chloride (KLOR-CON) 10 MEQ tablet Take 1 tablet (10 mEq total) by mouth 2 (two) times daily. 90 tablet 3   prochlorperazine (COMPAZINE) 10 MG tablet Take 1 tablet (10 mg total) by mouth every 6 (six) hours as needed for nausea or vomiting. 60 tablet 3   sacubitril-valsartan (ENTRESTO) 24-26 MG Take 1 tablet by mouth 2 (two) times daily.     scopolamine (TRANSDERM-SCOP) 1 MG/3DAYS      sildenafil (REVATIO) 20 MG tablet Take 20 mg by mouth daily.     spironolactone (ALDACTONE) 25 MG tablet Take 25 mg by mouth daily.     torsemide (DEMADEX) 20 MG tablet Take 20 mg by mouth 2 (two) times daily.     traMADol (ULTRAM) 50 MG tablet TAKE ONE TABLET BY MOUTH EVERY 8 HOURS AS NEEDED FOR SEVERE PAIN 60 tablet 0   vitamin B-12 (CYANOCOBALAMIN) 1000 MCG tablet Take 1,000 mcg by mouth daily.     zinc gluconate 50 MG tablet Take 50 mg by mouth daily.      zolpidem (AMBIEN) 10 MG tablet Take 1 tablet (10 mg total) by mouth at bedtime as needed for sleep. 20 tablet 0   No current facility-administered medications for this visit.  ALLERGIES:  Allergies  Allergen Reactions   Vancomycin     Other reaction(s): [place]  Syndrome Noted intraoperatively on 10/04/2018. No associated hemodynamic or respiratory changes. Please give slowly.    PHYSICAL EXAM:  Performance status (ECOG): 1 - Symptomatic but completely ambulatory  There were no vitals filed for this visit. Wt Readings from Last 3 Encounters:  11/13/20 183 lb 1.6 oz (83.1 kg)  10/16/20 178 lb (80.7 kg)  09/18/20 180 lb 6.4 oz (81.8 kg)   Physical Exam Vitals reviewed.  Constitutional:      Appearance: Normal appearance.  Cardiovascular:     Rate and Rhythm: Normal rate and regular rhythm.     Pulses: Normal pulses.     Heart sounds: Normal heart sounds.  Pulmonary:     Effort: Pulmonary effort is normal.     Breath sounds: Normal breath sounds.  Musculoskeletal:     Right lower leg: No edema.     Left lower leg: No edema.  Neurological:     General: No focal deficit present.     Mental Status: He is alert and oriented to person, place, and time.  Psychiatric:        Mood and Affect: Mood normal.        Behavior: Behavior normal.    LABORATORY DATA:  I have reviewed the labs as listed.  CBC Latest Ref Rng & Units 11/13/2020 10/16/2020 09/18/2020  WBC 4.0 - 10.5 K/uL 11.3(H) 11.9(H) 12.3(H)  Hemoglobin 13.0 - 17.0 g/dL 11.3(L) 11.3(L) 11.5(L)  Hematocrit 39.0 - 52.0 % 35.3(L) 35.2(L) 35.9(L)  Platelets 150 - 400 K/uL 253 265 272   CMP Latest Ref Rng & Units 11/13/2020 10/16/2020 09/18/2020  Glucose 70 - 99 mg/dL 103(H) 145(H) 97  BUN 8 - 23 mg/dL 28(H) 30(H) 27(H)  Creatinine 0.61 - 1.24 mg/dL 1.78(H) 2.36(H) 1.69(H)  Sodium 135 - 145 mmol/L 135 139 138  Potassium 3.5 - 5.1 mmol/L 4.1 3.7 4.0  Chloride 98 - 111 mmol/L 106 102 100  CO2 22 - 32 mmol/L 26 28 31   Calcium 8.9 - 10.3 mg/dL 8.5(L) 8.3(L) 8.5(L)  Total Protein 6.5 - 8.1 g/dL 5.2(L) 5.4(L) 5.6(L)  Total Bilirubin 0.3 - 1.2 mg/dL 0.6 0.5 0.2(L)  Alkaline Phos 38 - 126 U/L 89 97 121  AST 15 - 41 U/L 14(L) 13(L) 12(L)  ALT 0 - 44 U/L 11 9 9     DIAGNOSTIC IMAGING:  I have  independently reviewed the scans and discussed with the patient. No results found.   ASSESSMENT:  1.  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 Calton Dach -Dr. Joylene Grapes at [facility]-SPEP 0.2 g, immunofixation with biclonal IgG with lambda specificity. -Free kappa light chain 16.7, lambda light chains 122.7, ratio 0.14. -Kidney biopsy on 02/04/2020 with AL amyloidosis, lambda immunophenotype, Congo red stain positive.  Immunofluorescence microscopy shows dense homogeneous glomerular mesangial staining with antisera specific for IgG 1-2+, IgM 2+, C1q 2+, lambda light chains 3+.  Waxy homogeneous staining noted along the blood vessels.  Congo red stain demonstrates positive apocrine birefringence within the glomerular mesangial regions and along blood vessel walls. -48 pound weight loss since summer 2021 with decreased appetite.  Positive for fatigue. -Reports occasional nausea, no vomiting.  Positive for occipital headaches. -Denies any tingling or numbness in the extremities.  He has cramps in the left arm which is new.  He had leg cramps for a long time. -Cardiac MRI on  02/29/2020 with EF 38%, no evidence of amyloid deposits in the myocardium. -PET scan on 02/26/2020 with scattered borderline mediastinal and hilar lymph nodes with low-level hypermetabolism most likely inflammatory or reactive.  No bone abnormalities.  No findings in the abdomen or pelvis. -Bone marrow biopsy on 02/26/2020 with slightly hypercellular bone marrow with 23% cells consistent with plasma cells, staining for lambda light chain.  No lymphoproliferative disorder.  Congo red stain was negative. -Bone marrow cytogenetics were normal.  FISH panel for multiple myeloma was negative. -M spike was 0.1 g, immunofixation shows IgG lambda.  LDH was elevated at 216.  Beta-2 microglobulin 4.5. - 6 cycles of Dara CyBorD from 03/20/2020 through  09/04/2020.  Currently on maintenance daratumumab.   2.  Social/family history: -He works for [employer] and retired 5 years ago. Never smoker. -Maternal aunt had cancer in male organs.  One maternal first cousin had appendiceal cancer, another maternal first cousin had cancer of the digestive system.     PLAN:  1.  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  He is taking dexamethasone 20 mg as premeds. - Reviewed labs from 11/13/2020 which showed M spike is negative.  Lambda light chain has improved to 35 from 49.  Normal light chain ratio. - Labs from today shows normal CBC with slightly elevated white count from Decadron.  LFTs are grossly normal with albumin of 2.8.  Creatinine is 2.14. - He will proceed with maintenance Darzalex today. - We will follow-up on 24-hour urine which was submitted today. - He complained of acid reflux for 4 days.  He is taking Prilosec 20 mg twice daily.  He is using Tums as needed.  We will discontinue Prilosec and start him on Protonix.  RTC 4 weeks for follow-up.     2.  Leg pains: - Continue tramadol for mild pains and hydrocodone for moderate to severe pain.  Overall well controlled.   3.  Leg weakness/pains: - Left sural nerve biopsy on 07/02/2020 did not show any evidence of amyloidosis.  Some demyelination consistent with neuropathy. - Leg weakness continues to improve since we switched to maintenance therapy.   4.  Elevated creatinine: - Creatinine today is 2.17, slightly up from 1.78 at last visit.  We will closely monitor.   5.  Loss of appetite: - His weight has increased by 1 pound from last visit.  He is not requiring any appetite stimulants.   6.  Lower extremity swelling: - Continue torsemide 20 mg daily.  Spironolactone discontinued last week.   Orders placed this encounter:  No orders of the defined types were  placed in this encounter.    Derek Jack, MD [place] [phone]   I, Thana Ates, am acting as a scribe for Dr. Derek Miranda.  I, Derek Jack MD, have reviewed the above documentation for accuracy and completeness, and I agree with the above.

## 2020-12-11 ENCOUNTER — Inpatient Hospital Stay (HOSPITAL_COMMUNITY): Payer: Medicare Other

## 2020-12-11 ENCOUNTER — Other Ambulatory Visit: Payer: Self-pay

## 2020-12-11 ENCOUNTER — Inpatient Hospital Stay (HOSPITAL_BASED_OUTPATIENT_CLINIC_OR_DEPARTMENT_OTHER): Payer: Medicare Other | Admitting: Hematology

## 2020-12-11 ENCOUNTER — Inpatient Hospital Stay (HOSPITAL_COMMUNITY): Payer: Medicare Other | Attending: Hematology

## 2020-12-11 DIAGNOSIS — Z5111 Encounter for antineoplastic chemotherapy: Secondary | ICD-10-CM | POA: Insufficient documentation

## 2020-12-11 DIAGNOSIS — K30 Functional dyspepsia: Secondary | ICD-10-CM | POA: Insufficient documentation

## 2020-12-11 DIAGNOSIS — M79604 Pain in right leg: Secondary | ICD-10-CM | POA: Insufficient documentation

## 2020-12-11 DIAGNOSIS — R944 Abnormal results of kidney function studies: Secondary | ICD-10-CM | POA: Diagnosis not present

## 2020-12-11 DIAGNOSIS — Z7982 Long term (current) use of aspirin: Secondary | ICD-10-CM | POA: Insufficient documentation

## 2020-12-11 DIAGNOSIS — R630 Anorexia: Secondary | ICD-10-CM | POA: Insufficient documentation

## 2020-12-11 DIAGNOSIS — R7402 Elevation of levels of lactic acid dehydrogenase (LDH): Secondary | ICD-10-CM | POA: Diagnosis not present

## 2020-12-11 DIAGNOSIS — I252 Old myocardial infarction: Secondary | ICD-10-CM | POA: Insufficient documentation

## 2020-12-11 DIAGNOSIS — Z8673 Personal history of transient ischemic attack (TIA), and cerebral infarction without residual deficits: Secondary | ICD-10-CM | POA: Diagnosis not present

## 2020-12-11 DIAGNOSIS — M79605 Pain in left leg: Secondary | ICD-10-CM | POA: Diagnosis not present

## 2020-12-11 DIAGNOSIS — I739 Peripheral vascular disease, unspecified: Secondary | ICD-10-CM | POA: Diagnosis not present

## 2020-12-11 DIAGNOSIS — I10 Essential (primary) hypertension: Secondary | ICD-10-CM | POA: Diagnosis not present

## 2020-12-11 DIAGNOSIS — M7989 Other specified soft tissue disorders: Secondary | ICD-10-CM | POA: Diagnosis not present

## 2020-12-11 DIAGNOSIS — C9000 Multiple myeloma not having achieved remission: Secondary | ICD-10-CM

## 2020-12-11 DIAGNOSIS — E8581 Light chain (AL) amyloidosis: Secondary | ICD-10-CM | POA: Diagnosis not present

## 2020-12-11 DIAGNOSIS — R519 Headache, unspecified: Secondary | ICD-10-CM | POA: Diagnosis not present

## 2020-12-11 DIAGNOSIS — R634 Abnormal weight loss: Secondary | ICD-10-CM | POA: Insufficient documentation

## 2020-12-11 DIAGNOSIS — K219 Gastro-esophageal reflux disease without esophagitis: Secondary | ICD-10-CM | POA: Diagnosis not present

## 2020-12-11 DIAGNOSIS — I2510 Atherosclerotic heart disease of native coronary artery without angina pectoris: Secondary | ICD-10-CM | POA: Insufficient documentation

## 2020-12-11 DIAGNOSIS — Z7952 Long term (current) use of systemic steroids: Secondary | ICD-10-CM | POA: Insufficient documentation

## 2020-12-11 DIAGNOSIS — Z9221 Personal history of antineoplastic chemotherapy: Secondary | ICD-10-CM | POA: Insufficient documentation

## 2020-12-11 LAB — CBC WITH DIFFERENTIAL/PLATELET
Abs Immature Granulocytes: 0.06 10*3/uL (ref 0.00–0.07)
Basophils Absolute: 0.1 10*3/uL (ref 0.0–0.1)
Basophils Relative: 0 %
Eosinophils Absolute: 0.4 10*3/uL (ref 0.0–0.5)
Eosinophils Relative: 3 %
HCT: 38.9 % — ABNORMAL LOW (ref 39.0–52.0)
Hemoglobin: 12.5 g/dL — ABNORMAL LOW (ref 13.0–17.0)
Immature Granulocytes: 1 %
Lymphocytes Relative: 13 %
Lymphs Abs: 1.5 10*3/uL (ref 0.7–4.0)
MCH: 31.6 pg (ref 26.0–34.0)
MCHC: 32.1 g/dL (ref 30.0–36.0)
MCV: 98.2 fL (ref 80.0–100.0)
Monocytes Absolute: 0.5 10*3/uL (ref 0.1–1.0)
Monocytes Relative: 5 %
Neutro Abs: 9.2 10*3/uL — ABNORMAL HIGH (ref 1.7–7.7)
Neutrophils Relative %: 78 %
Platelets: 237 10*3/uL (ref 150–400)
RBC: 3.96 MIL/uL — ABNORMAL LOW (ref 4.22–5.81)
RDW: 16.1 % — ABNORMAL HIGH (ref 11.5–15.5)
WBC: 11.8 10*3/uL — ABNORMAL HIGH (ref 4.0–10.5)
nRBC: 0 % (ref 0.0–0.2)

## 2020-12-11 LAB — COMPREHENSIVE METABOLIC PANEL
ALT: 16 U/L (ref 0–44)
AST: 14 U/L — ABNORMAL LOW (ref 15–41)
Albumin: 2.8 g/dL — ABNORMAL LOW (ref 3.5–5.0)
Alkaline Phosphatase: 88 U/L (ref 38–126)
Anion gap: 9 (ref 5–15)
BUN: 27 mg/dL — ABNORMAL HIGH (ref 8–23)
CO2: 28 mmol/L (ref 22–32)
Calcium: 8.7 mg/dL — ABNORMAL LOW (ref 8.9–10.3)
Chloride: 102 mmol/L (ref 98–111)
Creatinine, Ser: 2.14 mg/dL — ABNORMAL HIGH (ref 0.61–1.24)
GFR, Estimated: 32 mL/min — ABNORMAL LOW (ref >60)
Glucose, Bld: 99 mg/dL (ref 70–99)
Potassium: 4.1 mmol/L (ref 3.5–5.1)
Sodium: 139 mmol/L (ref 135–145)
Total Bilirubin: 0.4 mg/dL (ref 0.3–1.2)
Total Protein: 5.7 g/dL — ABNORMAL LOW (ref 6.5–8.1)

## 2020-12-11 LAB — MAGNESIUM: Magnesium: 2.1 mg/dL (ref 1.7–2.4)

## 2020-12-11 MED ORDER — DARATUMUMAB-HYALURONIDASE-FIHJ 1800-30000 MG-UT/15ML ~~LOC~~ SOLN
1800.0000 mg | Freq: Once | SUBCUTANEOUS | Status: AC
  Administered 2020-12-11: 1800 mg via SUBCUTANEOUS
  Filled 2020-12-11: qty 15

## 2020-12-11 MED ORDER — PANTOPRAZOLE SODIUM 40 MG PO TBEC: 40.0000 mg | DELAYED_RELEASE_TABLET | Freq: Every day | ORAL | 6 refills | Status: DC

## 2020-12-11 MED ORDER — SODIUM CHLORIDE 0.9 % IV SOLN: Freq: Once | INTRAVENOUS | Status: DC

## 2020-12-11 MED ORDER — HEPARIN SOD (PORK) LOCK FLUSH 100 UNIT/ML IV SOLN
500.0000 [IU] | Freq: Once | INTRAVENOUS | Status: AC
  Administered 2020-12-11: 500 [IU] via INTRAVENOUS

## 2020-12-11 MED ORDER — SODIUM CHLORIDE 0.9% FLUSH
10.0000 mL | Freq: Once | INTRAVENOUS | Status: AC
  Administered 2020-12-11: 10 mL via INTRAVENOUS

## 2020-12-11 NOTE — Patient Instructions (Signed)
[facility]  Discharge Instructions: Thank you for choosing [facility] to provide your oncology and hematology care.  If you have a lab appointment with the [facility], please come in thru the Main Entrance and check in at the main information desk.  Wear comfortable clothing and clothing appropriate for easy access to any Portacath or PICC line.   We strive to give you quality time with your provider. You may need to reschedule your appointment if you arrive late (15 or more minutes).  Arriving late affects you and other patients whose appointments are after yours.  Also, if you miss three or more appointments without notifying the office, you may be dismissed from the clinic at the provider's discretion.      For prescription refill requests, have your pharmacy contact our office and allow 72 hours for refills to be completed.    Today you received the following chemotherapy and/or immunotherapy agents Daratumumab      To help prevent nausea and vomiting after your treatment, we encourage you to take your nausea medication as directed.  BELOW ARE SYMPTOMS THAT SHOULD BE REPORTED IMMEDIATELY: *FEVER GREATER THAN 100.4 F (38 C) OR HIGHER *CHILLS OR SWEATING *NAUSEA AND VOMITING THAT IS NOT CONTROLLED WITH YOUR NAUSEA MEDICATION *UNUSUAL SHORTNESS OF BREATH *UNUSUAL BRUISING OR BLEEDING *URINARY PROBLEMS (pain or burning when urinating, or frequent urination) *BOWEL PROBLEMS (unusual diarrhea, constipation, pain near the anus) TENDERNESS IN MOUTH AND THROAT WITH OR WITHOUT PRESENCE OF ULCERS (sore throat, sores in mouth, or a toothache) UNUSUAL RASH, SWELLING OR PAIN  UNUSUAL VAGINAL DISCHARGE OR ITCHING   Items with * indicate a potential emergency and should be followed up as soon as possible or go to the Emergency Department if any problems should occur.  Please show the CHEMOTHERAPY ALERT CARD or IMMUNOTHERAPY ALERT CARD at check-in to the Emergency  Department and triage nurse.  Should you have questions after your visit or need to cancel or reschedule your appointment, please contact [facility] [phone]  and follow the prompts.  Office hours are 8:00 a.m. to 4:30 p.m. Monday - Friday. Please note that voicemails left after 4:00 p.m. may not be returned until the following business day.  We are closed weekends and major holidays. You have access to a nurse at all times for urgent questions. Please call the main number to the clinic [phone] and follow the prompts.  For any non-urgent questions, you may also contact your provider using MyChart. We now offer e-Visits for anyone 31 and older to request care online for non-urgent symptoms. For details visit [email or web address].   Also download the MyChart app! Go to the app store, search "MyChart", open the app, select [facility], and log in with your MyChart username and password.  Due to Covid, a mask is required upon entering the hospital/clinic. If you do not have a mask, one will be given to you upon arrival. For doctor visits, patients may have 1 support person aged 55 or older with them. For treatment visits, patients cannot have anyone with them due to current Covid guidelines and our immunocompromised population.

## 2020-12-11 NOTE — Progress Notes (Signed)
Patient presents today for Daratumumab injection per providers order.  Vital signs and labs within parameters for treatment.  Patient has no new complaints at this time.  Patient took premedications (tylenol, benadryl, and dexamethasone) prior to appointment.  Daratumumab administration without incident; injection site WNL; see MAR for injection details.  Patient tolerated procedure well and without incident.  No questions or complaints noted at this time. Discharge from clinic ambulatory in stable condition.  Alert and oriented X 3.  Follow up with [facility] as scheduled.

## 2020-12-11 NOTE — Patient Instructions (Addendum)
[place] at [facility] Discharge Instructions  You were seen today by Dr. Delton Coombes. He went over your recent results, and you received your treatment. Stop taking Prilosec. You have been prescribed Protonix to be taken once daily for your indigestion. Dr. Delton Coombes will see you back in 1 month for labs and follow up.   Thank you for choosing [place] at [facility] to provide your oncology and hematology care.  To afford each patient quality time with our provider, please arrive at least 15 minutes before your scheduled appointment time.   If you have a lab appointment with the [facility] please come in thru the Main Entrance and check in at the main information desk  You need to re-schedule your appointment should you arrive 10 or more minutes late.  We strive to give you quality time with our providers, and arriving late affects you and other patients whose appointments are after yours.  Also, if you no show three or more times for appointments you may be dismissed from the clinic at the providers discretion.     Again, thank you for choosing [facility].  Our hope is that these requests will decrease the amount of time that you wait before being seen by our physicians.       _____________________________________________________________  Should you have questions after your visit to [facility], please contact our office at [phone] between the hours of 8:00 a.m. and 4:30 p.m.  Voicemails left after 4:00 p.m. will not be returned until the following business day.  For prescription refill requests, have your pharmacy contact our office and allow 72 hours.    Cancer Center Support Programs:   > Cancer Support Group  2nd Tuesday of the month 1pm-2pm, Journey Room

## 2020-12-11 NOTE — Progress Notes (Signed)
Patient reports taking premedications for Darzalex Faspro at home prior to arrival for appointment.  Per Ronne Binning, RN/Lashauna Arpin Ronnald Ramp, PharmD

## 2020-12-11 NOTE — Progress Notes (Signed)
Patient has been examined, vital signs and labs have been reviewed by Dr. Katragadda. ANC, Creatinine, LFTs, hemoglobin, and platelets are within treatment parameters per Dr. Katragadda. Patient is okay to proceed with treatment per M.D.   

## 2020-12-15 LAB — UPEP/UIFE/LIGHT CHAINS/TP, 24-HR UR
% BETA, Urine: 9.1 %
ALPHA 1 URINE: 4.9 %
Albumin, U: 74.2 %
Alpha 2, Urine: 8.9 %
Free Kappa Lt Chains,Ur: 66.84 mg/L (ref 1.17–86.46)
Free Kappa/Lambda Ratio: 0.51 — ABNORMAL LOW (ref 1.83–14.26)
Free Lambda Lt Chains,Ur: 130.74 mg/L — ABNORMAL HIGH (ref 0.27–15.21)
GAMMA GLOBULIN URINE: 2.9 %
M-SPIKE %, Urine: 1 % — ABNORMAL HIGH
M-Spike, Mg/24 Hr: 133 mg/24 hr — ABNORMAL HIGH
Total Protein, Urine-Ur/day: 13325 mg/24 hr — ABNORMAL HIGH (ref 30–150)
Total Protein, Urine: 740.3 mg/dL
Total Volume: 1800

## 2020-12-24 ENCOUNTER — Other Ambulatory Visit (HOSPITAL_COMMUNITY): Payer: Self-pay

## 2020-12-24 MED ORDER — TRAMADOL HCL 50 MG PO TABS: ORAL_TABLET | ORAL | 0 refills | Status: DC

## 2020-12-24 MED ORDER — HYDROCODONE-ACETAMINOPHEN 5-325 MG PO TABS: 1.0000 | ORAL_TABLET | Freq: Three times a day (TID) | ORAL | 0 refills | Status: DC | PRN

## 2020-12-24 MED ORDER — ALPRAZOLAM 0.25 MG PO TABS: 0.2500 mg | ORAL_TABLET | Freq: Four times a day (QID) | ORAL | 0 refills | Status: DC

## 2020-12-24 MED ORDER — DIAZEPAM 2 MG PO TABS: 2.0000 mg | ORAL_TABLET | Freq: Every day | ORAL | 0 refills | Status: DC | PRN

## 2021-01-07 NOTE — Progress Notes (Signed)
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  PRIOR THERAPY: none  NGS Results: not done  CURRENT THERAPY: DaraCyBorD & Aloxi weekly  BRIEF ONCOLOGIC HISTORY:  Oncology History  Multiple myeloma ([place])  03/06/2020 Initial Diagnosis   Multiple myeloma ([place])   03/06/2020 Cancer Staging   Staging form: Plasma Cell Myeloma and Plasma Cell Disorders, AJCC 8th Edition - Clinical stage from 03/06/2020: RISS Stage II (Beta-2-microglobulin (mg/L): 4.5, Albumin (g/dL): 2.5, ISS: Stage II, High-risk cytogenetics: Absent, LDH: Elevated) - Signed by Derek Jack, MD on 03/06/2020   03/20/2020 - 03/20/2020 Chemotherapy   The patient had dexamethasone (DECADRON) tablet 40 mg, 40 mg, Oral,  Once, 0 of 4 cycles bortezomib SQ (VELCADE) chemo injection (2.22m/mL concentration) 2.75 mg, 1.3 mg/m2 = 2.75 mg, Subcutaneous,  Once, 0 of 4 cycles   for chemotherapy treatment.     03/20/2020 -  Chemotherapy    Patient is on Treatment Plan: PRIMARY AMYLOIDOSIS DARACYBORD (DARATUMUMAB SQ + CYCLOPHOSPHAMIDE PO + BORTEZOMIB SQ + DEXAMETHASONE PO/IV) Q28D X 6 CYCLES / DARATUMUMAB SQ Q28D         CANCER STAGING: Cancer Staging Multiple myeloma ([place] Staging form: Plasma Cell Myeloma and Plasma Cell Disorders, AJCC 8th Edition - Clinical stage from 03/06/2020: RISS Stage II (Beta-2-microglobulin (mg/L): 4.5, Albumin (g/dL): 2.5, ISS: Stage II, High-risk cytogenetics: Absent, LDH: Elevated) - Signed by KDerek Jack MD on 03/06/2020   INTERVAL HISTORY:  Julian Miranda Forse a 70y.o. male, returns for routine follow-up and consideration for next cycle of chemotherapy. TPadraigwas last seen on 12/11/2020.  Due for cycle #12 of DaraCyBorD & Aloxi weekly today.   Overall, he tells me he has been  feeling pretty well. He reports continued GERD for which he is taking Protonix. He is wearing compression socks which have helped with his leg swellings. He denies n/v/d, and his appetite has improved. He is eating 3 meals daily. He denies tingling/numbness, and he reports occasional headaches occurring in the morning starting within the last 6 months which are resolved with tylenol. He denies vision changes.   Overall, he feels ready for next cycle of chemo today.   REVIEW OF SYSTEMS:  Review of Systems  Constitutional:  Negative for appetite change and fatigue (75%).  Eyes:  Negative for eye problems.  Cardiovascular:  Negative for leg swelling (improved).  Gastrointestinal:  Negative for diarrhea, nausea and vomiting.  Neurological:  Positive for headaches. Negative for numbness.  All other systems reviewed and are negative.  PAST MEDICAL/SURGICAL HISTORY:  Past Medical History:  Diagnosis Date   CAD (coronary artery disease)    GERD (gastroesophageal reflux disease)    Hypertension    Myocardial infarction ([facility] 1994   [place] disease ([place]    [place]    Past Surgical History:  Procedure Laterality Date   BYPASS GRAFT POPLITEAL TO POPLITEAL Right 03/23/2018   Procedure: BYPASS GRAFT RIGHT ABOVE KNEE POPLITEAL TO BELOW KNEE POPLITEAL ARTERY  USING RIGHT GREAT SAPHENOUS VEIN;  Surgeon: CMarty Heck MD;  [facility]  Service: Vascular;  Laterality: Right;   BYPASS GRAFT POPLITEAL TO POPLITEAL Left 04/09/2019   Procedure: BYPASS  ABOVE KNEE POPLITEAL TO BELOW KNEE POPLITEAL AND LIGATION OF LEFT POPITEAL ANEURYSM;  Surgeon: CMarty Heck MD;  [facility]  Service: Vascular;  Laterality: Left;   COLONOSCOPY WITH ESOPHAGOGASTRODUODENOSCOPY (EGD)     CORONARY ARTERY BYPASS GRAFT     EYE SURGERY Bilateral    cataracts   FEMORAL BYPASS Right 03/23/2018   HERNIA REPAIR     IR IMAGING GUIDED PORT INSERTION  03/25/2020   MASTOIDECTOMY     ROTATOR  CUFF REPAIR Right    TONSILLECTOMY     VEIN HARVEST Right 03/23/2018   Procedure: VEIN HARVEST RIGHT GREAT SAPHENOUS;  Surgeon: Marty Heck, MD;  Location: [facility] OR;  Service: Vascular;  Laterality: Right;   VEIN HARVEST Left 04/09/2019   Procedure: Harvest Greater Saphenous Vein and  Revise Elisa Lateral Saphenous Vein ;  Surgeon: Marty Heck, MD;  Location: [facility] OR;  Service: Vascular;  Laterality: Left;    SOCIAL HISTORY:  Social History   Socioeconomic History   Marital status: Married    Spouse name: Not on file   Number of children: Not on file   Years of education: Not on file   Highest education level: Not on file  Occupational History   Not on file  Tobacco Use   Smoking status: Never   Smokeless tobacco: Never  Vaping Use   Vaping Use: Never used  Substance and Sexual Activity   Alcohol use: Never   Drug use: Never   Sexual activity: Not on file  Other Topics Concern   Not on file  Social History Narrative   Not on file   Social Determinants of Health   Financial Resource Strain: Low Risk    Difficulty of Paying Living Expenses: Not hard at all  Food Insecurity: No Food Insecurity   Worried About Estate manager/land agent of Food in the Last Year: Never true   Arboriculturist in the Last Year: Never true  Transportation Needs: No Transportation Needs   Lack of Transportation (Medical): No   Lack of Transportation (Non-Medical): No  Physical Activity: Insufficiently Active   Days of Exercise per Week: 2 days   Minutes of Exercise per Session: 20 min  Stress: Stress Concern Present   Feeling of Stress : To some extent  Social Connections: Moderately Integrated   Frequency of Communication with Friends and Family: More than three times a week   Frequency of Social Gatherings with Friends and Family: Twice a week   Attends Religious Services: More than 4 times per year   Active Member of [employer] or Organizations: No   Attends Music therapist: Never    Marital Status: Married  Human resources officer Violence: Not At Risk   Fear of Current or Ex-Partner: No   Emotionally Abused: No   Physically Abused: No   Sexually Abused: No    FAMILY HISTORY:  Family History  Problem Relation Age of Onset   Heart disease Mother     CURRENT MEDICATIONS:  Current Outpatient Medications  Medication Sig Dispense Refill   acetaminophen (TYLENOL) 500 MG tablet Take 500 mg by mouth 2 (two) times daily.     acyclovir (ZOVIRAX) 400 MG tablet Take 1 tablet (400 mg total) by mouth 2 (two) times daily. 60 tablet 5   ALPRAZolam (XANAX) 0.25 MG tablet Take 1 tablet (0.25 mg total) by mouth in the morning, at noon, in the evening, and at bedtime. 120 tablet 0   Apixaban (ELIQUIS PO) Take by mouth.     aspirin 81 MG tablet Take 81 mg by mouth daily.     atorvastatin (LIPITOR) 80 MG tablet  Take 80 mg by mouth at bedtime.      carvedilol (COREG) 25 MG tablet Take 25 mg by mouth daily.     chlorproMAZINE (THORAZINE) 25 MG tablet Take 1 tablet (25 mg total) by mouth 4 (four) times daily as needed. For hiccups 60 tablet 2   Dapagliflozin Propanediol (FARXIGA PO) Take by mouth.     dexamethasone (DECADRON) 4 MG tablet Take 44m (5 tabs) one hour prior to injection appointment every 28 days 5 tablet 11   diazepam (VALIUM) 2 MG tablet Take 1 tablet (2 mg total) by mouth daily as needed (dizziness). 30 tablet 0   ergocalciferol (VITAMIN D2) 1.25 MG (50000 UT) capsule Take 1 capsule (50,000 Units total) by mouth once a week. 8 capsule 3   gabapentin (NEURONTIN) 300 MG capsule Take 1 capsule (300 mg total) by mouth daily. 30 capsule 6   HYDROcodone-acetaminophen (NORCO) 5-325 MG tablet Take 1 tablet by mouth every 8 (eight) hours as needed for moderate pain. 60 tablet 0   icosapent Ethyl (VASCEPA) 1 g capsule Take 1 g by mouth 2 (two) times daily.     Iron-Vitamin C (VITRON-C) 65-125 MG TABS Take 1 tablet by mouth daily. 60 tablet 3   lidocaine (XYLOCAINE) 2 % solution Use as  directed 15 mLs in the mouth or throat as needed for mouth pain. 15 mL 3   lidocaine-prilocaine (EMLA) cream Apply 1 application topically as needed. Apply to portacath as needed 30 g 0   magnesium oxide (MAG-OX) 400 (241.3 Mg) MG tablet Take 1 tablet (400 mg total) by mouth daily. 30 tablet 0   magnesium oxide (MAG-OX) 400 MG tablet Take 1 tablet (400 mg total) by mouth daily. 30 tablet 3   meclizine (ANTIVERT) 25 MG tablet Take 1 tablet (25 mg total) by mouth 2 (two) times daily as needed for dizziness. 30 tablet 3   megestrol (MEGACE) 400 MG/10ML suspension Take 10 mLs (400 mg total) by mouth 2 (two) times daily. 480 mL 2   metoprolol succinate (TOPROL-XL) 25 MG 24 hr tablet Take 25 mg by mouth daily.     Multiple Minerals (CALCIUM/MAGNESIUM/ZINC PO) Take 1 tablet by mouth at bedtime.     ondansetron (ZOFRAN) 4 MG tablet Take 4 mg by mouth every 8 (eight) hours as needed for nausea.     pantoprazole (PROTONIX) 40 MG tablet Take 1 tablet (40 mg total) by mouth daily. 30 tablet 6   potassium chloride (KLOR-CON) 10 MEQ tablet Take 1 tablet (10 mEq total) by mouth 2 (two) times daily. 90 tablet 3   prochlorperazine (COMPAZINE) 10 MG tablet Take 1 tablet (10 mg total) by mouth every 6 (six) hours as needed for nausea or vomiting. 60 tablet 3   sacubitril-valsartan (ENTRESTO) 24-26 MG Take 1 tablet by mouth 2 (two) times daily.     scopolamine (TRANSDERM-SCOP) 1 MG/3DAYS      sildenafil (REVATIO) 20 MG tablet Take 20 mg by mouth daily.     torsemide (DEMADEX) 20 MG tablet Take 20 mg by mouth 2 (two) times daily.     traMADol (ULTRAM) 50 MG tablet TAKE ONE TABLET BY MOUTH EVERY 8 HOURS AS NEEDED FOR SEVERE PAIN 60 tablet 0   vitamin B-12 (CYANOCOBALAMIN) 1000 MCG tablet Take 1,000 mcg by mouth daily.     zinc gluconate 50 MG tablet Take 50 mg by mouth daily.      zolpidem (AMBIEN) 10 MG tablet Take 1 tablet (10 mg total) by mouth at  bedtime as needed for sleep. 20 tablet 0   No current  facility-administered medications for this visit.    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 No associated hemodynamic or respiratory changes. Please give slowly.    PHYSICAL EXAM:  Performance status (ECOG): 1 - Symptomatic but completely ambulatory  There were no vitals filed for this visit. Wt Readings from Last 3 Encounters:  12/11/20 179 lb (81.2 kg)  11/13/20 183 lb 1.6 oz (83.1 kg)  10/16/20 178 lb (80.7 kg)   Physical Exam Vitals reviewed.  Constitutional:      Appearance: Normal appearance.  Cardiovascular:     Rate and Rhythm: Normal rate and regular rhythm.     Pulses: Normal pulses.     Heart sounds: Murmur heard.  Pulmonary:     Effort: Pulmonary effort is normal.     Breath sounds: Normal breath sounds.  Musculoskeletal:     Right lower leg: No edema.     Left lower leg: No edema.  Neurological:     General: No focal deficit present.     Mental Status: He is alert and oriented to person, place, and time.  Psychiatric:        Mood and Affect: Mood normal.        Behavior: Behavior normal.    LABORATORY DATA:  I have reviewed the labs as listed.  CBC Latest Ref Rng & Units 12/11/2020 11/13/2020 10/16/2020  WBC 4.0 - 10.5 K/uL 11.8(H) 11.3(H) 11.9(H)  Hemoglobin 13.0 - 17.0 g/dL 12.5(L) 11.3(L) 11.3(L)  Hematocrit 39.0 - 52.0 % 38.9(L) 35.3(L) 35.2(L)  Platelets 150 - 400 K/uL 237 253 265   CMP Latest Ref Rng & Units 12/11/2020 11/13/2020 10/16/2020  Glucose 70 - 99 mg/dL 99 103(H) 145(H)  BUN 8 - 23 mg/dL 27(H) 28(H) 30(H)  Creatinine 0.61 - 1.24 mg/dL 2.14(H) 1.78(H) 2.36(H)  Sodium 135 - 145 mmol/L 139 135 139  Potassium 3.5 - 5.1 mmol/L 4.1 4.1 3.7  Chloride 98 - 111 mmol/L 102 106 102  CO2 22 - 32 mmol/L 28 26 28   Calcium 8.9 - 10.3 mg/dL 8.7(L) 8.5(L) 8.3(L)  Total Protein 6.5 - 8.1 g/dL 5.7(L) 5.2(L) 5.4(L)  Total Bilirubin 0.3 - 1.2 mg/dL 0.4 0.6 0.5   Alkaline Phos 38 - 126 U/L 88 89 97  AST 15 - 41 U/L 14(L) 14(L) 13(L)  ALT 0 - 44 U/L 16 11 9     DIAGNOSTIC IMAGING:  I have independently reviewed the scans and discussed with the patient. No results found.   ASSESSMENT:  1.  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 Calton Dach -Dr. Joylene Grapes at [facility] 0.2 g, immunofixation with biclonal IgG with lambda specificity. -Free kappa light chain 16.7, lambda light chains 122.7, ratio 0.14. -Kidney biopsy on 02/04/2020 with AL amyloidosis, lambda immunophenotype, Congo red stain positive.  Immunofluorescence microscopy shows dense homogeneous glomerular mesangial staining with antisera specific for IgG 1-2+, IgM 2+, C1q 2+, lambda light chains 3+.  Waxy homogeneous staining noted along the blood vessels.  Congo red stain demonstrates positive apocrine birefringence within the glomerular mesangial regions and along blood vessel walls. -48 pound weight loss since summer 2021 with decreased appetite.  Positive for fatigue. -Reports occasional nausea, no vomiting.  Positive for occipital headaches. -Denies any tingling or numbness in the extremities.  He has cramps in the  left arm which is new.  He had leg cramps for a long time. -Cardiac MRI on 02/29/2020 with EF 38%, no evidence of amyloid deposits in the myocardium. -PET scan on 02/26/2020 with scattered borderline mediastinal and hilar lymph nodes with low-level hypermetabolism most likely inflammatory or reactive.  No bone abnormalities.  No findings in the abdomen or pelvis. -Bone marrow biopsy on 02/26/2020 with slightly hypercellular bone marrow with 23% cells consistent with plasma cells, staining for lambda light chain.  No lymphoproliferative disorder.  Congo red stain was negative. -Bone marrow cytogenetics were normal.  FISH panel for multiple myeloma was negative. -M spike was 0.1 g,  immunofixation shows IgG lambda.  LDH was elevated at 216.  Beta-2 microglobulin 4.5. - 6 cycles of Dara CyBorD from 03/20/2020 through 09/04/2020.  Currently on maintenance daratumumab.   2.  Social/family history: -He works for [employer] and retired 5 years ago. Never smoker. -Maternal aunt had cancer in male organs.  One maternal first cousin had appendiceal cancer, another maternal first cousin had cancer of the digestive system.   PLAN:  1.  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 - Myeloma panel from 11/13/2020 showed M spike negative.  Free light chain ratio is 0.4.  Lambda light chains are 35. - Last 24-hour urine on 12/11/2020 shows total protein 13 g. - Labs today shows white count is 11.8, normal platelet count and hemoglobin.  LFTs are grossly normal with improvement in albumin to 3. - Continue monthly maintenance Darzalex.  He is taking dexamethasone once a month with premeds. - He reports some occipital headaches in the mornings which improved with Tylenol.  No associated nausea or vision changes. - RTC 1 month for follow-up.  Plan to repeat 24-hour urine at next visit.     2.  Leg pains: - He will continue tramadol for mild pains and hydrocodone for moderate to severe pain.   3.  Leg weakness/pains: - Left sural nerve biopsy on 07/03/2018 did not show any evidence of amyloidosis.  Some demyelination consistent with neuropathy. - Leg strength at this time is good.  Denies any falls.  No numbness or tingling.   4.  Elevated creatinine: - Creatinine has improved to 1.91 from 2.14 after torsemide was discontinued.   5.  Loss of appetite: - He is not requiring any appetite stimulants.  He gained about 3 pounds and is eating well.   6.  Lower extremity swelling: - He stopped taking torsemide.  Wearing compression socks. - He has 1+ edema  today.  Creatinine improved to 1.91.  7.  Acid reflux: - He is currently taking Protonix 40 mg daily.  Acid reflux symptoms improved but not completely gone.  He was recommended to take Zantac/Tums as needed. - We will consider Dexilant if no symptom improvement.   Orders placed this encounter:  No orders of the defined types were placed in this encounter.    Derek Jack, MD [place] [phone]   I, Thana Ates, am acting as a scribe for Dr. Derek Jack.  I, Derek Jack MD, have reviewed the above documentation for accuracy and completeness, and I agree with the above.

## 2021-01-08 ENCOUNTER — Other Ambulatory Visit: Payer: Self-pay

## 2021-01-08 ENCOUNTER — Inpatient Hospital Stay (HOSPITAL_COMMUNITY): Payer: Medicare Other | Attending: Hematology

## 2021-01-08 ENCOUNTER — Inpatient Hospital Stay (HOSPITAL_BASED_OUTPATIENT_CLINIC_OR_DEPARTMENT_OTHER): Payer: Medicare Other | Admitting: Hematology

## 2021-01-08 ENCOUNTER — Inpatient Hospital Stay (HOSPITAL_COMMUNITY): Payer: Medicare Other

## 2021-01-08 VITALS — BP 138/89 | HR 96 | Temp 96.9°F | Resp 18 | Wt 186.6 lb

## 2021-01-08 DIAGNOSIS — K219 Gastro-esophageal reflux disease without esophagitis: Secondary | ICD-10-CM | POA: Insufficient documentation

## 2021-01-08 DIAGNOSIS — Z8673 Personal history of transient ischemic attack (TIA), and cerebral infarction without residual deficits: Secondary | ICD-10-CM | POA: Insufficient documentation

## 2021-01-08 DIAGNOSIS — Z79899 Other long term (current) drug therapy: Secondary | ICD-10-CM | POA: Insufficient documentation

## 2021-01-08 DIAGNOSIS — I10 Essential (primary) hypertension: Secondary | ICD-10-CM | POA: Diagnosis not present

## 2021-01-08 DIAGNOSIS — I739 Peripheral vascular disease, unspecified: Secondary | ICD-10-CM | POA: Diagnosis not present

## 2021-01-08 DIAGNOSIS — E8581 Light chain (AL) amyloidosis: Secondary | ICD-10-CM | POA: Insufficient documentation

## 2021-01-08 DIAGNOSIS — C9000 Multiple myeloma not having achieved remission: Secondary | ICD-10-CM

## 2021-01-08 DIAGNOSIS — R252 Cramp and spasm: Secondary | ICD-10-CM | POA: Insufficient documentation

## 2021-01-08 DIAGNOSIS — Z7984 Long term (current) use of oral hypoglycemic drugs: Secondary | ICD-10-CM | POA: Insufficient documentation

## 2021-01-08 DIAGNOSIS — R531 Weakness: Secondary | ICD-10-CM | POA: Insufficient documentation

## 2021-01-08 DIAGNOSIS — I252 Old myocardial infarction: Secondary | ICD-10-CM | POA: Insufficient documentation

## 2021-01-08 DIAGNOSIS — I2510 Atherosclerotic heart disease of native coronary artery without angina pectoris: Secondary | ICD-10-CM | POA: Insufficient documentation

## 2021-01-08 DIAGNOSIS — Z5112 Encounter for antineoplastic immunotherapy: Secondary | ICD-10-CM | POA: Insufficient documentation

## 2021-01-08 DIAGNOSIS — R630 Anorexia: Secondary | ICD-10-CM | POA: Diagnosis not present

## 2021-01-08 DIAGNOSIS — R609 Edema, unspecified: Secondary | ICD-10-CM | POA: Diagnosis not present

## 2021-01-08 DIAGNOSIS — R519 Headache, unspecified: Secondary | ICD-10-CM | POA: Insufficient documentation

## 2021-01-08 DIAGNOSIS — Z7901 Long term (current) use of anticoagulants: Secondary | ICD-10-CM | POA: Insufficient documentation

## 2021-01-08 DIAGNOSIS — R944 Abnormal results of kidney function studies: Secondary | ICD-10-CM | POA: Diagnosis not present

## 2021-01-08 DIAGNOSIS — R7402 Elevation of levels of lactic acid dehydrogenase (LDH): Secondary | ICD-10-CM | POA: Diagnosis not present

## 2021-01-08 DIAGNOSIS — M7989 Other specified soft tissue disorders: Secondary | ICD-10-CM | POA: Diagnosis not present

## 2021-01-08 DIAGNOSIS — R634 Abnormal weight loss: Secondary | ICD-10-CM | POA: Insufficient documentation

## 2021-01-08 DIAGNOSIS — Z7982 Long term (current) use of aspirin: Secondary | ICD-10-CM | POA: Insufficient documentation

## 2021-01-08 LAB — COMPREHENSIVE METABOLIC PANEL
ALT: 10 U/L (ref 0–44)
AST: 12 U/L — ABNORMAL LOW (ref 15–41)
Albumin: 3 g/dL — ABNORMAL LOW (ref 3.5–5.0)
Alkaline Phosphatase: 94 U/L (ref 38–126)
Anion gap: 7 (ref 5–15)
BUN: 24 mg/dL — ABNORMAL HIGH (ref 8–23)
CO2: 27 mmol/L (ref 22–32)
Calcium: 8.6 mg/dL — ABNORMAL LOW (ref 8.9–10.3)
Chloride: 104 mmol/L (ref 98–111)
Creatinine, Ser: 1.91 mg/dL — ABNORMAL HIGH (ref 0.61–1.24)
GFR, Estimated: 37 mL/min — ABNORMAL LOW (ref >60)
Glucose, Bld: 117 mg/dL — ABNORMAL HIGH (ref 70–99)
Potassium: 3.8 mmol/L (ref 3.5–5.1)
Sodium: 138 mmol/L (ref 135–145)
Total Bilirubin: 0.8 mg/dL (ref 0.3–1.2)
Total Protein: 5.9 g/dL — ABNORMAL LOW (ref 6.5–8.1)

## 2021-01-08 LAB — CBC WITH DIFFERENTIAL/PLATELET
Abs Immature Granulocytes: 0.05 10*3/uL (ref 0.00–0.07)
Basophils Absolute: 0.1 10*3/uL (ref 0.0–0.1)
Basophils Relative: 0 %
Eosinophils Absolute: 0.5 10*3/uL (ref 0.0–0.5)
Eosinophils Relative: 5 %
HCT: 38.7 % — ABNORMAL LOW (ref 39.0–52.0)
Hemoglobin: 12.5 g/dL — ABNORMAL LOW (ref 13.0–17.0)
Immature Granulocytes: 0 %
Lymphocytes Relative: 15 %
Lymphs Abs: 1.8 10*3/uL (ref 0.7–4.0)
MCH: 32.2 pg (ref 26.0–34.0)
MCHC: 32.3 g/dL (ref 30.0–36.0)
MCV: 99.7 fL (ref 80.0–100.0)
Monocytes Absolute: 0.7 10*3/uL (ref 0.1–1.0)
Monocytes Relative: 6 %
Neutro Abs: 8.8 10*3/uL — ABNORMAL HIGH (ref 1.7–7.7)
Neutrophils Relative %: 74 %
Platelets: 287 10*3/uL (ref 150–400)
RBC: 3.88 MIL/uL — ABNORMAL LOW (ref 4.22–5.81)
RDW: 17.1 % — ABNORMAL HIGH (ref 11.5–15.5)
WBC: 11.8 10*3/uL — ABNORMAL HIGH (ref 4.0–10.5)
nRBC: 0 % (ref 0.0–0.2)

## 2021-01-08 LAB — MAGNESIUM: Magnesium: 2.1 mg/dL (ref 1.7–2.4)

## 2021-01-08 MED ORDER — HEPARIN SOD (PORK) LOCK FLUSH 100 UNIT/ML IV SOLN
500.0000 [IU] | Freq: Once | INTRAVENOUS | Status: AC
  Administered 2021-01-08: 500 [IU] via INTRAVENOUS

## 2021-01-08 MED ORDER — DARATUMUMAB-HYALURONIDASE-FIHJ 1800-30000 MG-UT/15ML ~~LOC~~ SOLN
1800.0000 mg | Freq: Once | SUBCUTANEOUS | Status: AC
  Administered 2021-01-08: 1800 mg via SUBCUTANEOUS
  Filled 2021-01-08: qty 15

## 2021-01-08 NOTE — Progress Notes (Signed)
Patient has been examined, vital signs and labs have been reviewed by Dr. Katragadda. ANC, Creatinine, LFTs, hemoglobin, and platelets are within treatment parameters per Dr. Katragadda. Patient may proceed with treatment per M.D.   

## 2021-01-08 NOTE — Addendum Note (Signed)
Addended by: Joie Bimler on: 01/08/2021 01:31 PM   Modules accepted: Orders

## 2021-01-08 NOTE — Progress Notes (Signed)
Labs reviewed and ok for treatment today per MD. Patient took his pre-meds at home  Treatment given per orders. Patient tolerated it well without problems. Vitals stable and discharged home from clinic ambulatory. Follow up as scheduled.

## 2021-01-08 NOTE — Patient Instructions (Signed)
[place] at [facility] Discharge Instructions  You were seen and examined by Dr. Delton Coombes. You received Darzalex injection today. Continue Protonix as prescribed, and add Pepcid over the counter twice a day as needed for breakthrough indigestion.  Return as scheduled for lab work and office visit.    Thank you for choosing [place] at [facility] to provide your oncology and hematology care.  To afford each patient quality time with our provider, please arrive at least 15 minutes before your scheduled appointment time.   If you have a lab appointment with the [facility] please come in thru the Main Entrance and check in at the main information desk.  You need to re-schedule your appointment should you arrive 10 or more minutes late.  We strive to give you quality time with our providers, and arriving late affects you and other patients whose appointments are after yours.  Also, if you no show three or more times for appointments you may be dismissed from the clinic at the providers discretion.     Again, thank you for choosing [facility].  Our hope is that these requests will decrease the amount of time that you wait before being seen by our physicians.       _____________________________________________________________  Should you have questions after your visit to [facility], please contact our office at [phone] and follow the prompts.  Our office hours are 8:00 a.m. and 4:30 p.m. Monday - Friday.  Please note that voicemails left after 4:00 p.m. may not be returned until the following business day.  We are closed weekends and major holidays.  You do have access to a nurse 24-7, just call the main number to the clinic [phone] and do not press any options, hold on the line and a nurse will answer the phone.    For prescription refill requests, have your pharmacy contact our office and allow 72 hours.     Due to Covid, you will need to wear a mask upon entering the hospital. If you do not have a mask, a mask will be given to you at the Main Entrance upon arrival. For doctor visits, patients may have 1 support person age 75 or older with them. For treatment visits, patients can not have anyone with them due to social distancing guidelines and our immunocompromised population.

## 2021-01-08 NOTE — Addendum Note (Signed)
Addended by: Joie Bimler on: 01/08/2021 02:01 PM   Modules accepted: Orders

## 2021-01-08 NOTE — Patient Instructions (Signed)
[facility]  Discharge Instructions: Thank you for choosing [facility] to provide your oncology and hematology care.  If you have a lab appointment with the Cancer Center, please come in thru the Main Entrance and check in at the main information desk.  Wear comfortable clothing and clothing appropriate for easy access to any Portacath or PICC line.   We strive to give you quality time with your provider. You may need to reschedule your appointment if you arrive late (15 or more minutes).  Arriving late affects you and other patients whose appointments are after yours.  Also, if you miss three or more appointments without notifying the office, you may be dismissed from the clinic at the provider's discretion.      For prescription refill requests, have your pharmacy contact our office and allow 72 hours for refills to be completed.        To help prevent nausea and vomiting after your treatment, we encourage you to take your nausea medication as directed.  BELOW ARE SYMPTOMS THAT SHOULD BE REPORTED IMMEDIATELY: *FEVER GREATER THAN 100.4 F (38 C) OR HIGHER *CHILLS OR SWEATING *NAUSEA AND VOMITING THAT IS NOT CONTROLLED WITH YOUR NAUSEA MEDICATION *UNUSUAL SHORTNESS OF BREATH *UNUSUAL BRUISING OR BLEEDING *URINARY PROBLEMS (pain or burning when urinating, or frequent urination) *BOWEL PROBLEMS (unusual diarrhea, constipation, pain near the anus) TENDERNESS IN MOUTH AND THROAT WITH OR WITHOUT PRESENCE OF ULCERS (sore throat, sores in mouth, or a toothache) UNUSUAL RASH, SWELLING OR PAIN  UNUSUAL VAGINAL DISCHARGE OR ITCHING   Items with * indicate a potential emergency and should be followed up as soon as possible or go to the Emergency Department if any problems should occur.  Please show the CHEMOTHERAPY ALERT CARD or IMMUNOTHERAPY ALERT CARD at check-in to the Emergency Department and triage nurse.  Should you have questions after your visit or need to cancel  or reschedule your appointment, please contact [facility] [phone]  and follow the prompts.  Office hours are 8:00 a.m. to 4:30 p.m. Monday - Friday. Please note that voicemails left after 4:00 p.m. may not be returned until the following business day.  We are closed weekends and major holidays. You have access to a nurse at all times for urgent questions. Please call the main number to the clinic [phone] and follow the prompts.  For any non-urgent questions, you may also contact your provider using MyChart. We now offer e-Visits for anyone 18 and older to request care online for non-urgent symptoms. For details visit [email or web address].   Also download the MyChart app! Go to the app store, search "MyChart", open the app, select [facility], and log in with your MyChart username and password.  Due to Covid, a mask is required upon entering the hospital/clinic. If you do not have a mask, one will be given to you upon arrival. For doctor visits, patients may have 1 support person aged 18 or older with them. For treatment visits, patients cannot have anyone with them due to current Covid guidelines and our immunocompromised population.  

## 2021-01-11 ENCOUNTER — Other Ambulatory Visit: Payer: Self-pay

## 2021-01-11 DIAGNOSIS — I739 Peripheral vascular disease, unspecified: Secondary | ICD-10-CM

## 2021-01-14 ENCOUNTER — Other Ambulatory Visit (HOSPITAL_COMMUNITY): Payer: Self-pay

## 2021-01-19 ENCOUNTER — Other Ambulatory Visit (HOSPITAL_COMMUNITY): Payer: Self-pay

## 2021-01-19 MED ORDER — DIAZEPAM 2 MG PO TABS: 2.0000 mg | ORAL_TABLET | Freq: Every day | ORAL | 0 refills | Status: DC | PRN

## 2021-01-19 MED ORDER — HYDROCODONE-ACETAMINOPHEN 5-325 MG PO TABS: 1.0000 | ORAL_TABLET | Freq: Three times a day (TID) | ORAL | 0 refills | Status: DC | PRN

## 2021-01-19 MED ORDER — ALPRAZOLAM 0.25 MG PO TABS: 0.2500 mg | ORAL_TABLET | Freq: Four times a day (QID) | ORAL | 0 refills | Status: DC

## 2021-01-19 MED ORDER — TRAMADOL HCL 50 MG PO TABS: ORAL_TABLET | ORAL | 0 refills | Status: DC

## 2021-01-27 ENCOUNTER — Encounter (HOSPITAL_COMMUNITY): Payer: Medicare Other

## 2021-01-27 ENCOUNTER — Ambulatory Visit: Payer: Medicare Other | Admitting: Vascular Surgery

## 2021-01-27 ENCOUNTER — Other Ambulatory Visit (HOSPITAL_COMMUNITY): Payer: Medicare Other

## 2021-02-03 ENCOUNTER — Other Ambulatory Visit (HOSPITAL_COMMUNITY): Payer: Self-pay | Admitting: Hematology

## 2021-02-04 NOTE — Progress Notes (Signed)
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  PRIOR THERAPY: none  NGS Results: not done  CURRENT THERAPY: DaraCyBorD & Aloxi weekly  BRIEF ONCOLOGIC HISTORY:  Oncology History  Multiple myeloma ([place])  03/06/2020 Initial Diagnosis   Multiple myeloma ([place])   03/06/2020 Cancer Staging   Staging form: Plasma Cell Myeloma and Plasma Cell Disorders, AJCC 8th Edition - Clinical stage from 03/06/2020: RISS Stage II (Beta-2-microglobulin (mg/L): 4.5, Albumin (g/dL): 2.5, ISS: Stage II, High-risk cytogenetics: Absent, LDH: Elevated) - Signed by Derek Jack, MD on 03/06/2020    03/20/2020 - 03/20/2020 Chemotherapy   The patient had dexamethasone (DECADRON) tablet 40 mg, 40 mg, Oral,  Once, 0 of 4 cycles bortezomib SQ (VELCADE) chemo injection (2.76m/mL concentration) 2.75 mg, 1.3 mg/m2 = 2.75 mg, Subcutaneous,  Once, 0 of 4 cycles   for chemotherapy treatment.     03/20/2020 -  Chemotherapy   Patient is on Treatment Plan : PRIMARY AMYLOIDOSIS DaraCyBorD (Daratumumab SQ + Cyclophosphamide PO + Bortezomib SQ + Dexamethasone PO/IV) q28d x 6 cycles / Daratumumab SQ q28d       CANCER STAGING: Cancer Staging Multiple myeloma ([place] Staging form: Plasma Cell Myeloma and Plasma Cell Disorders, AJCC 8th Edition - Clinical stage from 03/06/2020: RISS Stage II (Beta-2-microglobulin (mg/L): 4.5, Albumin (g/dL): 2.5, ISS: Stage II, High-risk cytogenetics: Absent, LDH: Elevated) - Signed by KDerek Jack MD on 03/06/2020   INTERVAL HISTORY:  Mr. TDixon Miranda a 70y.o. male, returns for routine follow-up and consideration for next cycle of chemotherapy. TTrelliswas last seen on 01/08/2021.  Due for cycle #13 of Darzalex Faspro today.   Overall, he tells me he has been feeling pretty well. He reports good  appetite, but he lost 4 pounds in the past month. He denies SOB, numbness/tingling, and n/v/d. His headaches and leg pain have improved. He denies extremity weakness and recent falls. His acid reflux has improved with Protonix.   Overall, he feels ready for next cycle of chemo today.   REVIEW OF SYSTEMS:  Review of Systems  Constitutional:  Positive for appetite change (40 - 100%) and unexpected weight change (-4 lbs). Negative for fatigue (75%).  Respiratory:  Negative for shortness of breath.   Gastrointestinal:  Negative for diarrhea, nausea and vomiting.  Musculoskeletal:  Negative for myalgias (leg pain improved).  Neurological:  Negative for extremity weakness, headaches (improved) and numbness.  All other systems reviewed and are negative.  PAST MEDICAL/SURGICAL HISTORY:  Past Medical History:  Diagnosis Date   CAD (coronary artery disease)    GERD (gastroesophageal reflux disease)    Hypertension    Myocardial infarction ([facility]   [place]ipheral vascular disease ([facility]    Stroke ([facility]    Past Surgical History:  Procedure Laterality Date   BYPASS GRAFT POPLITEAL TO POPLITEAL Right 03/23/2018   Procedure: BYPASS GRAFT RIGHT ABOVE KNEE POPLITEAL TO BELOW KNEE POPLITEAL ARTERY  USING RIGHT GREAT SAPHENOUS VEIN;  Surgeon: CMarty Heck MD;  Location[facility]  Service: Vascular;  Laterality: Right;   BYPASS GRAFT POPLITEAL TO POPLITEAL Left 04/09/2019   Procedure: BYPASS  ABOVE KNEE POPLITEAL TO BELOW KNEE POPLITEAL AND LIGATION OF LEFT POPITEAL ANEURYSM;  Surgeon: CMarty Heck MD;  Location: [facility] OR;  Service: Vascular;  Laterality: Left;   COLONOSCOPY WITH ESOPHAGOGASTRODUODENOSCOPY (EGD)  CORONARY ARTERY BYPASS GRAFT     EYE SURGERY Bilateral    cataracts   FEMORAL BYPASS Right 03/23/2018   HERNIA REPAIR     IR IMAGING GUIDED PORT INSERTION  03/25/2020   MASTOIDECTOMY     ROTATOR CUFF REPAIR Right    TONSILLECTOMY     VEIN HARVEST Right 03/23/2018    Procedure: VEIN HARVEST RIGHT GREAT SAPHENOUS;  Surgeon: Marty Heck, MD;  Location: [facility] OR;  Service: Vascular;  Laterality: Right;   VEIN HARVEST Left 04/09/2019   Procedure: Harvest Greater Saphenous Vein and  Revise Elisa Lateral Saphenous Vein ;  Surgeon: Marty Heck, MD;  Location: [facility] OR;  Service: Vascular;  Laterality: Left;    SOCIAL HISTORY:  Social History   Socioeconomic History   Marital status: Married    Spouse name: Not on file   Number of children: Not on file   Years of education: Not on file   Highest education level: Not on file  Occupational History   Not on file  Tobacco Use   Smoking status: Never   Smokeless tobacco: Never  Vaping Use   Vaping Use: Never used  Substance and Sexual Activity   Alcohol use: Never   Drug use: Never   Sexual activity: Not on file  Other Topics Concern   Not on file  Social History Narrative   Not on file   Social Determinants of Health   Financial Resource Strain: Low Risk    Difficulty of Paying Living Expenses: Not hard at all  Food Insecurity: No Food Insecurity   Worried About Estate manager/land agent of Food in the Last Year: Never true   Arboriculturist in the Last Year: Never true  Transportation Needs: No Transportation Needs   Lack of Transportation (Medical): No   Lack of Transportation (Non-Medical): No  Physical Activity: Insufficiently Active   Days of Exercise per Week: 2 days   Minutes of Exercise per Session: 20 min  Stress: Stress Concern Present   Feeling of Stress : To some extent  Social Connections: Moderately Integrated   Frequency of Communication with Friends and Family: More than three times a week   Frequency of Social Gatherings with Friends and Family: Twice a week   Attends Religious Services: More than 4 times per year   Active Member of [employer] or Organizations: No   Attends Music therapist: Never   Marital Status: Married  Human resources officer Violence: Not At Risk    Fear of Current or Ex-Partner: No   Emotionally Abused: No   Physically Abused: No   Sexually Abused: No    FAMILY HISTORY:  Family History  Problem Relation Age of Onset   Heart disease Mother     CURRENT MEDICATIONS:  Current Outpatient Medications  Medication Sig Dispense Refill   acetaminophen (TYLENOL) 500 MG tablet Take 500 mg by mouth 2 (two) times daily.     acyclovir (ZOVIRAX) 400 MG tablet Take 1 tablet (400 mg total) by mouth 2 (two) times daily. 60 tablet 5   ALPRAZolam (XANAX) 0.25 MG tablet Take 1 tablet (0.25 mg total) by mouth in the morning, at noon, in the evening, and at bedtime. 120 tablet 0   Apixaban (ELIQUIS PO) Take by mouth.     aspirin 81 MG tablet Take 81 mg by mouth daily.     atorvastatin (LIPITOR) 80 MG tablet Take 80 mg by mouth at bedtime.      carvedilol (COREG) 25  MG tablet Take 25 mg by mouth daily.     chlorproMAZINE (THORAZINE) 25 MG tablet Take 1 tablet (25 mg total) by mouth 4 (four) times daily as needed. For hiccups 60 tablet 2   Dapagliflozin Propanediol (FARXIGA PO) Take by mouth.     dexamethasone (DECADRON) 4 MG tablet Take 3m (5 tabs) one hour prior to injection appointment every 28 days 5 tablet 11   diazepam (VALIUM) 2 MG tablet TAKE ONE TABLET BY MOUTH DAILY AS NEEDED FOR DIZZINESS 30 tablet 5   ergocalciferol (VITAMIN D2) 1.25 MG (50000 UT) capsule Take 1 capsule (50,000 Units total) by mouth once a week. 8 capsule 3   gabapentin (NEURONTIN) 300 MG capsule Take 1 capsule (300 mg total) by mouth daily. 30 capsule 6   HYDROcodone-acetaminophen (NORCO) 5-325 MG tablet Take 1 tablet by mouth every 8 (eight) hours as needed for moderate pain. 60 tablet 0   icosapent Ethyl (VASCEPA) 1 g capsule Take 1 g by mouth 2 (two) times daily.     Iron-Vitamin C (VITRON-C) 65-125 MG TABS Take 1 tablet by mouth daily. 60 tablet 3   lidocaine (XYLOCAINE) 2 % solution Use as directed 15 mLs in the mouth or throat as needed for mouth pain. 15 mL 3    magnesium oxide (MAG-OX) 400 (241.3 Mg) MG tablet Take 1 tablet (400 mg total) by mouth daily. 30 tablet 0   magnesium oxide (MAG-OX) 400 MG tablet Take 1 tablet (400 mg total) by mouth daily. 30 tablet 3   meclizine (ANTIVERT) 25 MG tablet Take 1 tablet (25 mg total) by mouth 2 (two) times daily as needed for dizziness. 30 tablet 3   megestrol (MEGACE) 400 MG/10ML suspension Take 10 mLs (400 mg total) by mouth 2 (two) times daily. 480 mL 2   metoprolol succinate (TOPROL-XL) 25 MG 24 hr tablet Take 25 mg by mouth daily.     Multiple Minerals (CALCIUM/MAGNESIUM/ZINC PO) Take 1 tablet by mouth at bedtime.     ondansetron (ZOFRAN) 4 MG tablet Take 4 mg by mouth every 8 (eight) hours as needed for nausea.     pantoprazole (PROTONIX) 40 MG tablet Take 1 tablet (40 mg total) by mouth daily. 30 tablet 6   potassium chloride (KLOR-CON) 10 MEQ tablet Take 1 tablet (10 mEq total) by mouth 2 (two) times daily. 90 tablet 3   sacubitril-valsartan (ENTRESTO) 24-26 MG Take 1 tablet by mouth 2 (two) times daily.     scopolamine (TRANSDERM-SCOP) 1 MG/3DAYS      sildenafil (REVATIO) 20 MG tablet Take 20 mg by mouth daily.     torsemide (DEMADEX) 20 MG tablet Take 20 mg by mouth 2 (two) times daily.     traMADol (ULTRAM) 50 MG tablet TAKE ONE TABLET BY MOUTH EVERY 8 HOURS AS NEEDED FOR SEVERE PAIN 60 tablet 0   vitamin B-12 (CYANOCOBALAMIN) 1000 MCG tablet Take 1,000 mcg by mouth daily.     zinc gluconate 50 MG tablet Take 50 mg by mouth daily.      lidocaine-prilocaine (EMLA) cream Apply 1 application topically as needed. Apply to portacath as needed (Patient not taking: Reported on 02/05/2021) 30 g 0   prochlorperazine (COMPAZINE) 10 MG tablet Take 1 tablet (10 mg total) by mouth every 6 (six) hours as needed for nausea or vomiting. (Patient not taking: Reported on 02/05/2021) 60 tablet 3   No current facility-administered medications for this visit.   Facility-Administered Medications Ordered in Other Visits   Medication Dose  Route Frequency Provider Last Rate Last Admin   heparin lock flush 100 unit/mL  500 Units Intravenous Once Derek Jack, MD       sodium chloride flush (NS) 0.9 % injection 10 mL  10 mL Intracatheter Once Derek Jack, MD        ALLERGIES:  Allergies  Allergen Reactions   Vancomycin     Other reaction(s): Red Man Syndrome Noted intraoperatively on 10/04/2018. No associated hemodynamic or respiratory changes. Please give slowly.    PHYSICAL EXAM:  Performance status (ECOG): 1 - Symptomatic but completely ambulatory  Vitals:   02/05/21 0945  BP: 116/77  Pulse: 83  Resp: 18  Temp: (!) 97 F (36.1 C)  SpO2: 95%   Wt Readings from Last 3 Encounters:  02/05/21 182 lb 15.7 oz (83 kg)  01/08/21 186 lb 9.6 oz (84.6 kg)  12/11/20 179 lb (81.2 kg)   Physical Exam Vitals reviewed.  Constitutional:      Appearance: Normal appearance.  Cardiovascular:     Rate and Rhythm: Normal rate and regular rhythm.     Pulses: Normal pulses.     Heart sounds: Normal heart sounds.  Pulmonary:     Effort: Pulmonary effort is normal.     Breath sounds: Normal breath sounds.  Neurological:     General: No focal deficit present.     Mental Status: He is alert and oriented to person, place, and time.  Psychiatric:        Mood and Affect: Mood normal.        Behavior: Behavior normal.    LABORATORY DATA:  I have reviewed the labs as listed.  CBC Latest Ref Rng & Units 02/05/2021 01/08/2021 12/11/2020  WBC 4.0 - 10.5 K/uL 11.8(H) 11.8(H) 11.8(H)  Hemoglobin 13.0 - 17.0 g/dL 13.2 12.5(L) 12.5(L)  Hematocrit 39.0 - 52.0 % 40.4 38.7(L) 38.9(L)  Platelets 150 - 400 K/uL 300 287 237   CMP Latest Ref Rng & Units 01/08/2021 12/11/2020 11/13/2020  Glucose 70 - 99 mg/dL 117(H) 99 103(H)  BUN 8 - 23 mg/dL 24(H) 27(H) 28(H)  Creatinine 0.61 - 1.24 mg/dL 1.91(H) 2.14(H) 1.78(H)  Sodium 135 - 145 mmol/L 138 139 135  Potassium 3.5 - 5.1 mmol/L 3.8 4.1 4.1  Chloride 98 - 111  mmol/L 104 102 106  CO2 22 - 32 mmol/L _0 Calcium 8.9 - 10.3 mg/dL 8.6(L) 8.7(L) 8.5(L)  Total Protein 6.5 - 8.1 g/dL 5.9(L) 5.7(L) 5.2(L)  Total Bilirubin 0.3 - 1.2 mg/dL 0.8 0.4 0.6  Alkaline Phos 38 - 126 U/L 94 88 89  AST 15 - 41 U/L 12(L) 14(L) 14(L)  ALT 0 - 44 U/L _1 DIAGNOSTIC IMAGING:  I have independently reviewed the scans and discussed with the patient. No results found.   ASSESSMENT:  1.  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 Calton Dach -Dr. Joylene Grapes at [facility]-SPEP 0.2 g, immunofixation with biclonal IgG with lambda specificity. -Free kappa light chain 16.7, lambda light chains 122.7, ratio 0.14. -Kidney biopsy on 02/04/2020 with AL amyloidosis, lambda immunophenotype, Congo red stain positive.  Immunofluorescence microscopy shows dense homogeneous glomerular mesangial staining with antisera specific for IgG 1-2+, IgM 2+, C1q 2+, lambda light chains 3+.  Waxy homogeneous staining noted along the blood vessels.  Congo red stain demonstrates positive apocrine birefringence within the glomerular mesangial regions and along blood vessel walls. -48 pound weight loss since summer  2021 with decreased appetite.  Positive for fatigue. -Reports occasional nausea, no vomiting.  Positive for occipital headaches. -Denies any tingling or numbness in the extremities.  He has cramps in the left arm which is new.  He had leg cramps for a long time. -Cardiac MRI on 02/29/2020 with EF 38%, no evidence of amyloid deposits in the myocardium. -PET scan on 02/26/2020 with scattered borderline mediastinal and hilar lymph nodes with low-level hypermetabolism most likely inflammatory or reactive.  No bone abnormalities.  No findings in the abdomen or pelvis. -Bone marrow biopsy on 02/26/2020 with slightly hypercellular bone marrow with 23% cells consistent with plasma cells, staining for  lambda light chain.  No lymphoproliferative disorder.  Congo red stain was negative. -Bone marrow cytogenetics were normal.  FISH panel for multiple myeloma was negative. -M spike was 0.1 g, immunofixation shows IgG lambda.  LDH was elevated at 216.  Beta-2 microglobulin 4.5. - 6 cycles of Dara CyBorD from 03/20/2020 through 09/04/2020.  Currently on maintenance daratumumab.   2.  Social/family history: -He works for [employer] and retired 5 years ago. Never smoker. -Maternal aunt had cancer in male organs.  One maternal first cousin had appendiceal cancer, another maternal first cousin had cancer of the digestive system.   PLAN:  1.  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  We have sent another panel today.  Lambda light chains were 35. - Last 24-hour urine showed 13 g of protein on 12/11/2020.  We have sent another 24-hour urine today.  Reviewed labs today which showed normal CBC.  LFTs were grossly normal.  Albumin is 3.1.  LDH was elevated at 196. - He reports headaches are better.  He denies any falls. - Proceed with Darzalex today.  RTC 4 weeks for follow-up.     2.  Leg pains: - Continue tramadol for mild pains and hydrocodone for moderate to severe pains.   3.  Leg weakness/pains: - Left sural nerve biopsy was not consistent with amyloidosis.  Some demyelination consistent with neuropathy. - Leg strength is stable.   4.  Elevated creatinine: - Creatinine has improved to 1.91 from 2.14 after torsemide was discontinued.   5.  Loss of appetite: - He is not requiring any appetite stimulants.  Weight is more or less stable.   6.  Lower extremity swelling: - He stopped taking torsemide.  He has trace edema. - Continue compression socks.  7.  Acid reflux: - Continue Protonix.  Continue Zantac/Tums as needed.   Orders placed this encounter:  No orders of the  defined types were placed in this encounter.    Derek Jack, MD [facility] [phone]   I, Thana Ates, am acting as a scribe for Dr. Derek Jack.  I, Derek Jack MD, have reviewed the above documentation for accuracy and completeness, and I agree with the above.

## 2021-02-05 ENCOUNTER — Other Ambulatory Visit: Payer: Self-pay

## 2021-02-05 ENCOUNTER — Inpatient Hospital Stay (HOSPITAL_COMMUNITY): Payer: Medicare Other | Attending: Hematology

## 2021-02-05 ENCOUNTER — Inpatient Hospital Stay (HOSPITAL_BASED_OUTPATIENT_CLINIC_OR_DEPARTMENT_OTHER): Payer: Medicare Other | Admitting: Hematology

## 2021-02-05 ENCOUNTER — Inpatient Hospital Stay (HOSPITAL_COMMUNITY): Payer: Medicare Other

## 2021-02-05 VITALS — BP 116/77 | HR 83 | Temp 97.0°F | Resp 18 | Wt 183.0 lb

## 2021-02-05 DIAGNOSIS — Z7982 Long term (current) use of aspirin: Secondary | ICD-10-CM | POA: Insufficient documentation

## 2021-02-05 DIAGNOSIS — I252 Old myocardial infarction: Secondary | ICD-10-CM | POA: Insufficient documentation

## 2021-02-05 DIAGNOSIS — Z8673 Personal history of transient ischemic attack (TIA), and cerebral infarction without residual deficits: Secondary | ICD-10-CM | POA: Diagnosis not present

## 2021-02-05 DIAGNOSIS — I10 Essential (primary) hypertension: Secondary | ICD-10-CM | POA: Diagnosis not present

## 2021-02-05 DIAGNOSIS — Z7901 Long term (current) use of anticoagulants: Secondary | ICD-10-CM | POA: Diagnosis not present

## 2021-02-05 DIAGNOSIS — R944 Abnormal results of kidney function studies: Secondary | ICD-10-CM | POA: Diagnosis not present

## 2021-02-05 DIAGNOSIS — E8581 Light chain (AL) amyloidosis: Secondary | ICD-10-CM | POA: Diagnosis not present

## 2021-02-05 DIAGNOSIS — Z5112 Encounter for antineoplastic immunotherapy: Secondary | ICD-10-CM | POA: Insufficient documentation

## 2021-02-05 DIAGNOSIS — I739 Peripheral vascular disease, unspecified: Secondary | ICD-10-CM | POA: Diagnosis not present

## 2021-02-05 DIAGNOSIS — R7402 Elevation of levels of lactic acid dehydrogenase (LDH): Secondary | ICD-10-CM | POA: Diagnosis not present

## 2021-02-05 DIAGNOSIS — C9000 Multiple myeloma not having achieved remission: Secondary | ICD-10-CM | POA: Insufficient documentation

## 2021-02-05 DIAGNOSIS — R634 Abnormal weight loss: Secondary | ICD-10-CM | POA: Diagnosis not present

## 2021-02-05 DIAGNOSIS — K219 Gastro-esophageal reflux disease without esophagitis: Secondary | ICD-10-CM | POA: Insufficient documentation

## 2021-02-05 DIAGNOSIS — I2510 Atherosclerotic heart disease of native coronary artery without angina pectoris: Secondary | ICD-10-CM | POA: Insufficient documentation

## 2021-02-05 LAB — CBC WITH DIFFERENTIAL/PLATELET
Abs Immature Granulocytes: 0.05 10*3/uL (ref 0.00–0.07)
Basophils Absolute: 0.1 10*3/uL (ref 0.0–0.1)
Basophils Relative: 1 %
Eosinophils Absolute: 0.5 10*3/uL (ref 0.0–0.5)
Eosinophils Relative: 4 %
HCT: 40.4 % (ref 39.0–52.0)
Hemoglobin: 13.2 g/dL (ref 13.0–17.0)
Immature Granulocytes: 0 %
Lymphocytes Relative: 12 %
Lymphs Abs: 1.4 10*3/uL (ref 0.7–4.0)
MCH: 32.7 pg (ref 26.0–34.0)
MCHC: 32.7 g/dL (ref 30.0–36.0)
MCV: 100 fL (ref 80.0–100.0)
Monocytes Absolute: 0.5 10*3/uL (ref 0.1–1.0)
Monocytes Relative: 4 %
Neutro Abs: 9.3 10*3/uL — ABNORMAL HIGH (ref 1.7–7.7)
Neutrophils Relative %: 79 %
Platelets: 300 10*3/uL (ref 150–400)
RBC: 4.04 MIL/uL — ABNORMAL LOW (ref 4.22–5.81)
RDW: 15.9 % — ABNORMAL HIGH (ref 11.5–15.5)
WBC: 11.8 10*3/uL — ABNORMAL HIGH (ref 4.0–10.5)
nRBC: 0 % (ref 0.0–0.2)

## 2021-02-05 LAB — COMPREHENSIVE METABOLIC PANEL
ALT: 12 U/L (ref 0–44)
AST: 14 U/L — ABNORMAL LOW (ref 15–41)
Albumin: 3.1 g/dL — ABNORMAL LOW (ref 3.5–5.0)
Alkaline Phosphatase: 99 U/L (ref 38–126)
Anion gap: 8 (ref 5–15)
BUN: 26 mg/dL — ABNORMAL HIGH (ref 8–23)
CO2: 26 mmol/L (ref 22–32)
Calcium: 9 mg/dL (ref 8.9–10.3)
Chloride: 104 mmol/L (ref 98–111)
Creatinine, Ser: 1.97 mg/dL — ABNORMAL HIGH (ref 0.61–1.24)
GFR, Estimated: 36 mL/min — ABNORMAL LOW (ref >60)
Glucose, Bld: 95 mg/dL (ref 70–99)
Potassium: 4.2 mmol/L (ref 3.5–5.1)
Sodium: 138 mmol/L (ref 135–145)
Total Bilirubin: 0.4 mg/dL (ref 0.3–1.2)
Total Protein: 6.3 g/dL — ABNORMAL LOW (ref 6.5–8.1)

## 2021-02-05 LAB — MAGNESIUM: Magnesium: 2 mg/dL (ref 1.7–2.4)

## 2021-02-05 LAB — LACTATE DEHYDROGENASE: LDH: 196 U/L — ABNORMAL HIGH (ref 98–192)

## 2021-02-05 MED ORDER — DARATUMUMAB-HYALURONIDASE-FIHJ 1800-30000 MG-UT/15ML ~~LOC~~ SOLN
1800.0000 mg | Freq: Once | SUBCUTANEOUS | Status: AC
  Administered 2021-02-05: 1800 mg via SUBCUTANEOUS
  Filled 2021-02-05: qty 15

## 2021-02-05 MED ORDER — SODIUM CHLORIDE 0.9% FLUSH
10.0000 mL | Freq: Once | INTRAVENOUS | Status: AC
  Administered 2021-02-05: 10 mL

## 2021-02-05 MED ORDER — HEPARIN SOD (PORK) LOCK FLUSH 100 UNIT/ML IV SOLN
500.0000 [IU] | Freq: Once | INTRAVENOUS | Status: AC
  Administered 2021-02-05: 500 [IU] via INTRAVENOUS

## 2021-02-05 NOTE — Patient Instructions (Signed)
[facility]  Discharge Instructions: Thank you for choosing [facility] to provide your oncology and hematology care.  If you have a lab appointment with the [facility], please come in thru the Main Entrance and check in at the main information desk.  Wear comfortable clothing and clothing appropriate for easy access to any Portacath or PICC line.   We strive to give you quality time with your provider. You may need to reschedule your appointment if you arrive late (15 or more minutes).  Arriving late affects you and other patients whose appointments are after yours.  Also, if you miss three or more appointments without notifying the office, you may be dismissed from the clinic at the provider's discretion.      For prescription refill requests, have your pharmacy contact our office and allow 72 hours for refills to be completed.    Today you received the following chemotherapy and/or immunotherapy agents Darzalex Faspro [place]      To help prevent nausea and vomiting after your treatment, we encourage you to take your nausea medication as directed.  BELOW ARE SYMPTOMS THAT SHOULD BE REPORTED IMMEDIATELY: *FEVER GREATER THAN 100.4 F (38 C) OR HIGHER *CHILLS OR SWEATING *NAUSEA AND VOMITING THAT IS NOT CONTROLLED WITH YOUR NAUSEA MEDICATION *UNUSUAL SHORTNESS OF BREATH *UNUSUAL BRUISING OR BLEEDING *URINARY PROBLEMS (pain or burning when urinating, or frequent urination) *BOWEL PROBLEMS (unusual diarrhea, constipation, pain near the anus) TENDERNESS IN MOUTH AND THROAT WITH OR WITHOUT PRESENCE OF ULCERS (sore throat, sores in mouth, or a toothache) UNUSUAL RASH, SWELLING OR PAIN  UNUSUAL VAGINAL DISCHARGE OR ITCHING   Items with * indicate a potential emergency and should be followed up as soon as possible or go to the Emergency Department if any problems should occur.  Please show the CHEMOTHERAPY ALERT CARD or IMMUNOTHERAPY ALERT CARD at check-in to the  Emergency Department and triage nurse.  Should you have questions after your visit or need to cancel or reschedule your appointment, please contact [facility] [phone]  and follow the prompts.  Office hours are 8:00 a.m. to 4:30 p.m. Monday - Friday. Please note that voicemails left after 4:00 p.m. may not be returned until the following business day.  We are closed weekends and major holidays. You have access to a nurse at all times for urgent questions. Please call the main number to the clinic [phone] and follow the prompts.  For any non-urgent questions, you may also contact your provider using MyChart. We now offer e-Visits for anyone 27 and older to request care online for non-urgent symptoms. For details visit [email or web address].   Also download the MyChart app! Go to the app store, search "MyChart", open the app, select [facility], and log in with your MyChart username and password.  Due to Covid, a mask is required upon entering the hospital/clinic. If you do not have a mask, one will be given to you upon arrival. For doctor visits, patients may have 1 support person aged 57 or older with them. For treatment visits, patients cannot have anyone with them due to current Covid guidelines and our immunocompromised population.

## 2021-02-05 NOTE — Progress Notes (Signed)
Patient has been examined, vital signs and labs have been reviewed by Dr. Katragadda. ANC, Creatinine, LFTs, hemoglobin, and platelets are within treatment parameters per Dr. Katragadda. Patient may proceed with treatment per M.D.   

## 2021-02-05 NOTE — Patient Instructions (Signed)
[place] at [facility] Discharge Instructions  You were seen and examined today by Dr. Delton Coombes. You will receive your Darzalex injection today. Return as scheduled in 4 weeks for labs, office visit, and treatment.     Thank you for choosing [place] at [facility] to provide your oncology and hematology care.  To afford each patient quality time with our provider, please arrive at least 15 minutes before your scheduled appointment time.   If you have a lab appointment with the [facility] please come in thru the Main Entrance and check in at the main information desk.  You need to re-schedule your appointment should you arrive 10 or more minutes late.  We strive to give you quality time with our providers, and arriving late affects you and other patients whose appointments are after yours.  Also, if you no show three or more times for appointments you may be dismissed from the clinic at the providers discretion.     Again, thank you for choosing [facility].  Our hope is that these requests will decrease the amount of time that you wait before being seen by our physicians.       _____________________________________________________________  Should you have questions after your visit to [facility], please contact our office at [phone] and follow the prompts.  Our office hours are 8:00 a.m. and 4:30 p.m. Monday - Friday.  Please note that voicemails left after 4:00 p.m. may not be returned until the following business day.  We are closed weekends and major holidays.  You do have access to a nurse 24-7, just call the main number to the clinic [phone] and do not press any options, hold on the line and a nurse will answer the phone.    For prescription refill requests, have your pharmacy contact our office and allow 72 hours.    Due to Covid, you will need to wear a mask upon entering the hospital. If you do  not have a mask, a mask will be given to you at the Main Entrance upon arrival. For doctor visits, patients may have 1 support person age 14 or older with them. For treatment visits, patients can not have anyone with them due to social distancing guidelines and our immunocompromised population.

## 2021-02-05 NOTE — Progress Notes (Signed)
Patient presents today for follow up visit with Dr. Delton Coombes and treatment. Vital signs within parameters for treatment today. Labs pending. Patient denies any changes since his last treatment. Patient has no complaints of any side effects related to his treatment. Patient states he took his pre-meds prior to arrival at 08:00 am. Tylenol 650 mg, Benadryl 50 mg, and Decadron 20 mg PO.   Creatinine 1.97.   Labs reviewed by MD . Verbal message received by Lora Havens RN/ Dr. Delton Coombes to proceed with treatment.   Treatment given today per MD orders. Tolerated without adverse affects. Vital signs stable. No complaints at this time. Discharged from clinic ambulatory in stable condition. Alert and oriented x 3. F/U with [facility] as scheduled.

## 2021-02-06 ENCOUNTER — Inpatient Hospital Stay (HOSPITAL_COMMUNITY): Payer: Medicare Other | Attending: Hematology

## 2021-02-06 ENCOUNTER — Other Ambulatory Visit (HOSPITAL_COMMUNITY): Payer: Self-pay

## 2021-02-06 DIAGNOSIS — C9000 Multiple myeloma not having achieved remission: Secondary | ICD-10-CM

## 2021-02-06 DIAGNOSIS — E8581 Light chain (AL) amyloidosis: Secondary | ICD-10-CM

## 2021-02-06 LAB — KAPPA/LAMBDA LIGHT CHAINS
Kappa free light chain: 14.6 mg/L (ref 3.3–19.4)
Kappa, lambda light chain ratio: 0.34 (ref 0.26–1.65)
Lambda free light chains: 43 mg/L — ABNORMAL HIGH (ref 5.7–26.3)

## 2021-02-06 MED ORDER — DIPHENHYDRAMINE HCL 25 MG PO CAPS: 25.0000 mg | ORAL_CAPSULE | Freq: Once | ORAL | Status: AC

## 2021-02-06 MED ORDER — ACETAMINOPHEN 325 MG PO TABS: 650.0000 mg | ORAL_TABLET | Freq: Once | ORAL | Status: AC

## 2021-02-06 MED ORDER — SODIUM CHLORIDE 0.9% IV SOLUTION: 250.0000 mL | Freq: Once | INTRAVENOUS | Status: AC

## 2021-02-06 MED ORDER — SODIUM CHLORIDE 0.9% FLUSH: 10.0000 mL | INTRAVENOUS | Status: AC | PRN

## 2021-02-06 MED ORDER — HEPARIN SOD (PORK) LOCK FLUSH 100 UNIT/ML IV SOLN: 500.0000 [IU] | Freq: Every day | INTRAVENOUS | Status: AC | PRN

## 2021-02-06 MED ORDER — TRAMADOL HCL 50 MG PO TABS: ORAL_TABLET | ORAL | 0 refills | Status: DC

## 2021-02-06 MED ORDER — DIPHENHYDRAMINE HCL 25 MG PO CAPS: 25.0000 mg | ORAL_CAPSULE | Freq: Once | ORAL | Status: DC

## 2021-02-06 NOTE — Progress Notes (Signed)
Lab called and needs more specimen for Kappa/Lambda/light chains, protein electrophoresis, Immunofixation electrophoresis.

## 2021-02-09 LAB — UPEP/UIFE/LIGHT CHAINS/TP, 24-HR UR
% BETA, Urine: 12.9 %
ALPHA 1 URINE: 5.9 %
Albumin, U: 73.1 %
Alpha 2, Urine: 6.5 %
Free Kappa Lt Chains,Ur: 91.78 mg/L — ABNORMAL HIGH (ref 1.17–86.46)
Free Kappa/Lambda Ratio: 0.5 — ABNORMAL LOW (ref 1.83–14.26)
Free Lambda Lt Chains,Ur: 182.76 mg/L — ABNORMAL HIGH (ref 0.27–15.21)
GAMMA GLOBULIN URINE: 1.7 %
Total Protein, Urine-Ur/day: 13585 mg/24 hr — ABNORMAL HIGH (ref 30–150)
Total Protein, Urine: 1109 mg/dL
Total Volume: 1225

## 2021-02-09 LAB — KAPPA/LAMBDA LIGHT CHAINS
Kappa free light chain: 12.4 mg/L (ref 3.3–19.4)
Kappa, lambda light chain ratio: 0.39 (ref 0.26–1.65)
Lambda free light chains: 32.2 mg/L — ABNORMAL HIGH (ref 5.7–26.3)

## 2021-02-10 LAB — PROTEIN ELECTROPHORESIS, SERUM
A/G Ratio: 1.1 (ref 0.7–1.7)
Albumin ELP: 2.7 g/dL — ABNORMAL LOW (ref 2.9–4.4)
Alpha-1-Globulin: 0.2 g/dL (ref 0.0–0.4)
Alpha-2-Globulin: 1.1 g/dL — ABNORMAL HIGH (ref 0.4–1.0)
Beta Globulin: 0.7 g/dL (ref 0.7–1.3)
Gamma Globulin: 0.3 g/dL — ABNORMAL LOW (ref 0.4–1.8)
Globulin, Total: 2.4 g/dL (ref 2.2–3.9)
M-Spike, %: 0.1 g/dL — ABNORMAL HIGH
Total Protein ELP: 5.1 g/dL — ABNORMAL LOW (ref 6.0–8.5)

## 2021-02-12 LAB — IMMUNOFIXATION ELECTROPHORESIS
IgA: 17 mg/dL — ABNORMAL LOW (ref 61–437)
IgG (Immunoglobin G), Serum: 350 mg/dL — ABNORMAL LOW (ref 603–1613)
IgM (Immunoglobulin M), Srm: 44 mg/dL (ref 15–143)
Total Protein ELP: 5.3 g/dL — ABNORMAL LOW (ref 6.0–8.5)

## 2021-02-18 ENCOUNTER — Other Ambulatory Visit (HOSPITAL_COMMUNITY): Payer: Self-pay

## 2021-02-18 MED ORDER — HYDROCODONE-ACETAMINOPHEN 5-325 MG PO TABS
1.0000 | ORAL_TABLET | Freq: Three times a day (TID) | ORAL | 0 refills | Status: DC | PRN
Start: 2021-02-18 — End: 2021-03-06

## 2021-02-18 MED ORDER — DIAZEPAM 2 MG PO TABS: ORAL_TABLET | ORAL | 5 refills | Status: DC

## 2021-02-18 MED ORDER — ALPRAZOLAM 0.25 MG PO TABS
0.2500 mg | ORAL_TABLET | Freq: Four times a day (QID) | ORAL | 0 refills | Status: DC
Start: 2021-02-18 — End: 2021-03-23

## 2021-02-18 MED ORDER — MAGNESIUM OXIDE 400 MG PO TABS: 400.0000 mg | ORAL_TABLET | Freq: Every day | ORAL | 3 refills | Status: DC

## 2021-02-18 MED ORDER — ACYCLOVIR 400 MG PO TABS
400.0000 mg | ORAL_TABLET | Freq: Two times a day (BID) | ORAL | 5 refills | Status: AC
Start: 2021-02-18 — End: ?

## 2021-03-03 ENCOUNTER — Other Ambulatory Visit: Payer: Self-pay

## 2021-03-03 ENCOUNTER — Ambulatory Visit (INDEPENDENT_AMBULATORY_CARE_PROVIDER_SITE_OTHER)
Admission: RE | Admit: 2021-03-03 | Discharge: 2021-03-03 | Disposition: A | Discharge status: Home / Self Care | Payer: Medicare Other | Source: Ambulatory Visit | Attending: Vascular Surgery | Admitting: Vascular Surgery

## 2021-03-03 ENCOUNTER — Ambulatory Visit (INDEPENDENT_AMBULATORY_CARE_PROVIDER_SITE_OTHER): Payer: Medicare Other | Admitting: Vascular Surgery

## 2021-03-03 ENCOUNTER — Ambulatory Visit (HOSPITAL_COMMUNITY)
Admission: RE | Admit: 2021-03-03 | Discharge: 2021-03-03 | Disposition: A | Discharge status: Home / Self Care | Payer: Medicare Other | Source: Ambulatory Visit | Attending: Vascular Surgery | Admitting: Vascular Surgery

## 2021-03-03 ENCOUNTER — Encounter: Payer: Self-pay | Admitting: Vascular Surgery

## 2021-03-03 VITALS — BP 145/91 | HR 88 | Temp 97.1°F | Ht 70.0 in | Wt 189.3 lb

## 2021-03-03 DIAGNOSIS — I739 Peripheral vascular disease, unspecified: Secondary | ICD-10-CM | POA: Diagnosis present

## 2021-03-03 DIAGNOSIS — I724 Aneurysm of artery of lower extremity: Secondary | ICD-10-CM | POA: Diagnosis not present

## 2021-03-03 NOTE — Progress Notes (Signed)
Patient name: Julian Miranda MRN: [id number] DOB: 1951-01-17 Sex: male  REASON FOR VISIT: 1 year follow-up for surveillance of bilateral lower extremity bypass for exclusion of popliteal aneurysm  HPI: Julian Miranda is a 70 y.o. male that presents for 1 year follow-up of bilateral lower extremity bypasses for exclusion of popliteal aneurysm.  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  On follow-up today his lower extremities are doing well.  Unfortunately he was diagnosed with multiple myeloma and is now undergoing chemotherapy and has lost about 75 pounds.  He was walking with a walker but is now walking with a cane.  No lower extremity complaints.  Past Medical History:  Diagnosis Date   CAD (coronary artery disease)    GERD (gastroesophageal reflux disease)    Hypertension    Myocardial infarction [facility]) 1994   Peripheral vascular disease ([place])    Stroke [facility])     Past Surgical History:  Procedure Laterality Date   BYPASS GRAFT POPLITEAL TO POPLITEAL Right 03/23/2018   Procedure: BYPASS GRAFT RIGHT ABOVE KNEE POPLITEAL TO BELOW KNEE POPLITEAL ARTERY  USING RIGHT GREAT SAPHENOUS VEIN;  Surgeon: Marty Heck, MD;  Location: [place];  Service: Vascular;  Laterality: Right;   BYPASS GRAFT POPLITEAL TO POPLITEAL Left 04/09/2019   Procedure: BYPASS  ABOVE KNEE POPLITEAL TO BELOW KNEE POPLITEAL AND LIGATION OF LEFT POPITEAL ANEURYSM;  Surgeon: Marty Heck, MD;  Location: [facility] OR;  Service: Vascular;  Laterality: Left;   COLONOSCOPY WITH ESOPHAGOGASTRODUODENOSCOPY (EGD)     CORONARY ARTERY BYPASS GRAFT     EYE SURGERY Bilateral    cataracts   FEMORAL BYPASS Right 03/23/2018   HERNIA REPAIR     IR IMAGING GUIDED PORT INSERTION  03/25/2020    MASTOIDECTOMY     ROTATOR CUFF REPAIR Right    TONSILLECTOMY     VEIN HARVEST Right 03/23/2018   Procedure: VEIN HARVEST RIGHT GREAT SAPHENOUS;  Surgeon: Marty Heck, MD;  Location: [facility] OR;  Service: Vascular;  Laterality: Right;   VEIN HARVEST Left 04/09/2019   Procedure: Harvest Greater Saphenous Vein and  Revise Elisa Lateral Saphenous Vein ;  Surgeon: Marty Heck, MD;  Location: [facility] OR;  Service: Vascular;  Laterality: Left;    Family History  Problem Relation Age of Onset   Heart disease Mother     SOCIAL HISTORY: Social History   Tobacco Use   Smoking status: Never   Smokeless tobacco: Never  Substance Use Topics   Alcohol use: Never    Allergies  Allergen Reactions   Vancomycin     Other reaction(s): Red Man Syndrome Noted intraoperatively on 10/04/2018. No associated hemodynamic or respiratory changes. Please give slowly.    Current Outpatient Medications  Medication Sig Dispense Refill   acetaminophen (TYLENOL) 500 MG tablet Take 500 mg by mouth 2 (two) times daily.     acyclovir (ZOVIRAX) 400 MG tablet Take 1 tablet (400 mg total) by mouth 2 (two) times daily. 60 tablet 5   ALPRAZolam (XANAX) 0.25 MG tablet Take 1 tablet (0.25 mg total) by mouth in the morning, at noon, in the evening, and at bedtime. 120 tablet 0   Apixaban (ELIQUIS PO) Take by mouth.     aspirin 81 MG tablet Take 81  mg by mouth daily.     atorvastatin (LIPITOR) 80 MG tablet Take 80 mg by mouth at bedtime.      carvedilol (COREG) 25 MG tablet Take 25 mg by mouth daily.     chlorproMAZINE (THORAZINE) 25 MG tablet Take 1 tablet (25 mg total) by mouth 4 (four) times daily as needed. For hiccups 60 tablet 2   Dapagliflozin Propanediol (FARXIGA PO) Take by mouth.     dexamethasone (DECADRON) 4 MG tablet Take 56m (5 tabs) one hour prior to injection appointment every 28 days 5 tablet 11   diazepam (VALIUM) 2 MG tablet TAKE ONE TABLET BY MOUTH DAILY AS NEEDED FOR DIZZINESS 30 tablet 5    ergocalciferol (VITAMIN D2) 1.25 MG (50000 UT) capsule Take 1 capsule (50,000 Units total) by mouth once a week. 8 capsule 3   gabapentin (NEURONTIN) 300 MG capsule Take 1 capsule (300 mg total) by mouth daily. 30 capsule 6   HYDROcodone-acetaminophen (NORCO) 5-325 MG tablet Take 1 tablet by mouth every 8 (eight) hours as needed for moderate pain. 60 tablet 0   icosapent Ethyl (VASCEPA) 1 g capsule Take 1 g by mouth 2 (two) times daily.     Iron-Vitamin C (VITRON-C) 65-125 MG TABS Take 1 tablet by mouth daily. 60 tablet 3   lidocaine (XYLOCAINE) 2 % solution Use as directed 15 mLs in the mouth or throat as needed for mouth pain. 15 mL 3   lidocaine-prilocaine (EMLA) cream Apply 1 application topically as needed. Apply to portacath as needed 30 g 0   magnesium oxide (MAG-OX) 400 (241.3 Mg) MG tablet Take 1 tablet (400 mg total) by mouth daily. 30 tablet 0   magnesium oxide (MAG-OX) 400 MG tablet Take 1 tablet (400 mg total) by mouth daily. 30 tablet 3   meclizine (ANTIVERT) 25 MG tablet Take 1 tablet (25 mg total) by mouth 2 (two) times daily as needed for dizziness. 30 tablet 3   megestrol (MEGACE) 400 MG/10ML suspension Take 10 mLs (400 mg total) by mouth 2 (two) times daily. 480 mL 2   metoprolol succinate (TOPROL-XL) 25 MG 24 hr tablet Take 25 mg by mouth daily.     Multiple Minerals (CALCIUM/MAGNESIUM/ZINC PO) Take 1 tablet by mouth at bedtime.     ondansetron (ZOFRAN) 4 MG tablet Take 4 mg by mouth every 8 (eight) hours as needed for nausea.     pantoprazole (PROTONIX) 40 MG tablet Take 1 tablet (40 mg total) by mouth daily. 30 tablet 6   potassium chloride (KLOR-CON) 10 MEQ tablet Take 1 tablet (10 mEq total) by mouth 2 (two) times daily. 90 tablet 3   prochlorperazine (COMPAZINE) 10 MG tablet Take 1 tablet (10 mg total) by mouth every 6 (six) hours as needed for nausea or vomiting. 60 tablet 3   sacubitril-valsartan (ENTRESTO) 24-26 MG Take 1 tablet by mouth 2 (two) times daily.      scopolamine (TRANSDERM-SCOP) 1 MG/3DAYS      sildenafil (REVATIO) 20 MG tablet Take 20 mg by mouth daily.     torsemide (DEMADEX) 20 MG tablet Take 20 mg by mouth 2 (two) times daily.     traMADol (ULTRAM) 50 MG tablet TAKE ONE TABLET BY MOUTH EVERY 8 HOURS AS NEEDED FOR SEVERE PAIN 60 tablet 0   vitamin B-12 (CYANOCOBALAMIN) 1000 MCG tablet Take 1,000 mcg by mouth daily.     zinc gluconate 50 MG tablet Take 50 mg by mouth daily.      No current  facility-administered medications for this visit.   Facility-Administered Medications Ordered in Other Visits  Medication Dose Route Frequency Provider Last Rate Last Admin   0.9 %  sodium chloride infusion (Manually program via Guardrails IV Fluids)  250 mL Intravenous Once Pennington, Rebekah M, PA-C       acetaminophen (TYLENOL) tablet 650 mg  650 mg Oral Once Pennington, Rebekah M, PA-C       diphenhydrAMINE (BENADRYL) capsule 25 mg  25 mg Oral Once Pennington, Rebekah M, PA-C       heparin lock flush 100 unit/mL  500 Units Intracatheter Daily PRN Pennington, Rebekah M, PA-C       sodium chloride flush (NS) 0.9 % injection 10 mL  10 mL Intracatheter PRN Pennington, Rebekah M, PA-C        REVIEW OF SYSTEMS:  _0  denotes positive finding, _1  denotes negative finding Cardiac  Comments:  Chest pain or chest pressure:    Shortness of breath upon exertion:    Short of breath when lying flat:    Irregular heart rhythm:        Vascular    Pain in calf, thigh, or hip brought on by ambulation:    Pain in feet at night that wakes you up from your sleep:     Blood clot in your veins:    Leg swelling:         Pulmonary    Oxygen at home:    Productive cough:     Wheezing:         Neurologic    Sudden weakness in arms or legs:     Sudden numbness in arms or legs:     Sudden onset of difficulty speaking or slurred speech:    Temporary loss of vision in one eye:     Problems with dizziness:         Gastrointestinal    Blood in stool:      Vomited blood:         Genitourinary    Burning when urinating:     Blood in urine:        Psychiatric    Major depression:         Hematologic    Bleeding problems:    Problems with blood clotting too easily:        Skin    Rashes or ulcers:        Constitutional    Fever or chills:      PHYSICAL EXAM: Vitals:   03/03/21 1509  BP: (!) 145/91  Pulse: 88  Temp: (!) 97.1 F (36.2 C)  TempSrc: Temporal  SpO2: 96%  Weight: 189 lb 4.8 oz (85.9 kg)  Height: _2  (1.778 m)    GENERAL: The patient is a well-nourished male, in no acute distress. The vital signs are documented above. CARDIAC: There is a regular rate and rhythm.  VASCULAR:  Bilateral leg incisions previously well healed Bilateral DP pulses palpable ABDOMEN: Soft and non-tender. MUSCULOSKELETAL: There are no major deformities or cyanosis. NEUROLOGIC: No focal weakness or paresthesias are detected.  DATA:   Bilateral lower extremity arterial duplex today shows widely patent bypasses with no evidence of stenosis.  ABIs today are 1.19 on the right triphasic and 1.21 on the left triphasic.   Assessment/Plan:  70 year old male presents for 1 year interval follow-up for bilateral lower extremity bypasses for exclusion of popliteal aneurysms.  Discussed that both bypasses are widely patent on duplex today.  He has  easily palpable dorsalis pedis pulses bilaterally.  Very pleased with his progress.  I hope he continues to improve with chemotherapy for his multiple myeloma.  We will see him again in 1 year with follow-up with bilateral extra arterial duplexes and ABIs for surveillance.   Marty Heck, MD [facility] Office: [place]

## 2021-03-04 NOTE — Progress Notes (Signed)
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  PRIOR THERAPY: none  NGS Results: not done  CURRENT THERAPY: DaraCyBorD & Aloxi weekly  BRIEF ONCOLOGIC HISTORY:  Oncology History  Multiple myeloma ([facility])  03/06/2020 Initial Diagnosis   Multiple myeloma ([facility])   03/06/2020 Cancer Staging   Staging form: Plasma Cell Myeloma and Plasma Cell Disorders, AJCC 8th Edition - Clinical stage from 03/06/2020: RISS Stage II (Beta-2-microglobulin (mg/L): 4.5, Albumin (g/dL): 2.5, ISS: Stage II, High-risk cytogenetics: Absent, LDH: Elevated) - Signed by Derek Jack, MD on 03/06/2020    03/20/2020 - 03/20/2020 Chemotherapy   The patient had dexamethasone (DECADRON) tablet 40 mg, 40 mg, Oral,  Once, 0 of 4 cycles bortezomib SQ (VELCADE) chemo injection (2.49m/mL concentration) 2.75 mg, 1.3 mg/m2 = 2.75 mg, Subcutaneous,  Once, 0 of 4 cycles   for chemotherapy treatment.     03/20/2020 -  Chemotherapy   Patient is on Treatment Plan : PRIMARY AMYLOIDOSIS DaraCyBorD (Daratumumab SQ + Cyclophosphamide PO + Bortezomib SQ + Dexamethasone PO/IV) q28d x 6 cycles / Daratumumab SQ q28d       CANCER STAGING:  Cancer Staging  Multiple myeloma ([facility] Staging form: Plasma Cell Myeloma and Plasma Cell Disorders, AJCC 8th Edition - Clinical stage from 03/06/2020: RISS Stage II (Beta-2-microglobulin (mg/L): 4.5, Albumin (g/dL): 2.5, ISS: Stage II, High-risk cytogenetics: Absent, LDH: Elevated) - Signed by KDerek Jack MD on 03/06/2020   INTERVAL HISTORY:  Julian Miranda a 70y.o. male, returns for routine follow-up and consideration for next cycle of chemotherapy. [place] last seen on 02/05/2021.  Due for cycle #14 of DaraCyBorD & Aloxi today.   Overall, he tells me he has been feeling pretty well. He reports  pain in his legs in the afternoon and evenings which occurs while sitting for which he is currently taking 2 hydrocodone and 2 tramadol daily. He reports occasional malaise which is also helped by the hydrocodone and tramadol. He has gained 7 pounds since 11/03. He denies any recent falls, n/v/d/c, fevers, infections, fatigue.    Overall, he feels ready for next cycle of chemo today.   REVIEW OF SYSTEMS:  Review of Systems  Constitutional:  Positive for unexpected weight change (+7 lbs). Negative for appetite change, fatigue and fever.  Gastrointestinal:  Negative for constipation, diarrhea, nausea and vomiting.  Musculoskeletal:  Positive for arthralgias (legs).  Neurological:  Positive for headaches.  All other systems reviewed and are negative.  PAST MEDICAL/SURGICAL HISTORY:  Past Medical History:  Diagnosis Date   CAD (coronary artery disease)    GERD (gastroesophageal reflux disease)    Hypertension    Myocardial infarction ([facility] 1994   Peripheral vascular disease ([facility]    Stroke ([facility]    Past Surgical History:  Procedure Laterality Date   BYPASS GRAFT POPLITEAL TO POPLITEAL Right 03/23/2018   Procedure: BYPASS GRAFT RIGHT ABOVE KNEE POPLITEAL TO BELOW KNEE POPLITEAL ARTERY  USING RIGHT GREAT SAPHENOUS VEIN;  Surgeon: CMarty Heck MD;  [facility]  Service: Vascular;  Laterality: Right;   BYPASS GRAFT POPLITEAL TO POPLITEAL Left 04/09/2019   Procedure: BYPASS  ABOVE KNEE POPLITEAL TO BELOW KNEE POPLITEAL AND LIGATION OF LEFT POPITEAL ANEURYSM;  Surgeon: CMarty Heck MD;  [facility];  Service: Vascular;  Laterality: Left;   COLONOSCOPY WITH ESOPHAGOGASTRODUODENOSCOPY (EGD)  CORONARY ARTERY BYPASS GRAFT     EYE SURGERY Bilateral    cataracts   FEMORAL BYPASS Right 03/23/2018   HERNIA REPAIR     IR IMAGING GUIDED PORT INSERTION  03/25/2020   MASTOIDECTOMY     ROTATOR CUFF REPAIR Right    TONSILLECTOMY     VEIN HARVEST Right 03/23/2018    Procedure: VEIN HARVEST RIGHT GREAT SAPHENOUS;  Surgeon: Marty Heck, MD;  Location: [facility];  Service: Vascular;  Laterality: Right;   VEIN HARVEST Left 04/09/2019   Procedure: Harvest Julian Saphenous Vein and  Revise Elisa Lateral Saphenous Vein ;  Surgeon: Marty Heck, MD;  Location: [facility];  Service: Vascular;  Laterality: Left;    SOCIAL HISTORY:  Social History   Socioeconomic History   Marital status: Married    Spouse name: Not on file   Number of children: Not on file   Years of education: Not on file   Highest education level: Not on file  Occupational History   Not on file  Tobacco Use   Smoking status: Never   Smokeless tobacco: Never  Vaping Use   Vaping Use: Never used  Substance and Sexual Activity   Alcohol use: Never   Drug use: Never   Sexual activity: Not on file  Other Topics Concern   Not on file  Social History Narrative   Not on file   Social Determinants of Health   Financial Resource Strain: Not on file  Food Insecurity: Not on file  Transportation Needs: Not on file  Physical Activity: Not on file  Stress: Not on file  Social Connections: Not on file  Intimate Partner Violence: Not on file    FAMILY HISTORY:  Family History  Problem Relation Age of Onset   Heart disease Mother     CURRENT MEDICATIONS:  Current Outpatient Medications  Medication Sig Dispense Refill   acetaminophen (TYLENOL) 500 MG tablet Take 500 mg by mouth 2 (two) times daily.     acyclovir (ZOVIRAX) 400 MG tablet Take 1 tablet (400 mg total) by mouth 2 (two) times daily. 60 tablet 5   ALPRAZolam (XANAX) 0.25 MG tablet Take 1 tablet (0.25 mg total) by mouth in the morning, at noon, in the evening, and at bedtime. 120 tablet 0   Apixaban (ELIQUIS PO) Take by mouth.     aspirin 81 MG tablet Take 81 mg by mouth daily.     atorvastatin (LIPITOR) 80 MG tablet Take 80 mg by mouth at bedtime.      carvedilol (COREG) 25 MG tablet Take 25 mg by mouth daily.      chlorproMAZINE (THORAZINE) 25 MG tablet Take 1 tablet (25 mg total) by mouth 4 (four) times daily as needed. For hiccups 60 tablet 2   Dapagliflozin Propanediol (FARXIGA PO) Take by mouth.     dexamethasone (DECADRON) 4 MG tablet Take 36m (5 tabs) one hour prior to injection appointment every 28 days 5 tablet 11   diazepam (VALIUM) 2 MG tablet TAKE ONE TABLET BY MOUTH DAILY AS NEEDED FOR DIZZINESS 30 tablet 5   ergocalciferol (VITAMIN D2) 1.25 MG (50000 UT) capsule Take 1 capsule (50,000 Units total) by mouth once a week. 8 capsule 3   gabapentin (NEURONTIN) 300 MG capsule Take 1 capsule (300 mg total) by mouth daily. 30 capsule 6   HYDROcodone-acetaminophen (NORCO) 5-325 MG tablet Take 1 tablet by mouth every 8 (eight) hours as needed for moderate pain. 60 tablet 0   icosapent  Ethyl (VASCEPA) 1 g capsule Take 1 g by mouth 2 (two) times daily.     Iron-Vitamin C (VITRON-C) 65-125 MG TABS Take 1 tablet by mouth daily. 60 tablet 3   lidocaine (XYLOCAINE) 2 % solution Use as directed 15 mLs in the mouth Miranda throat as needed for mouth pain. 15 mL 3   lidocaine-prilocaine (EMLA) cream Apply 1 application topically as needed. Apply to portacath as needed 30 g 0   magnesium oxide (MAG-OX) 400 (241.3 Mg) MG tablet Take 1 tablet (400 mg total) by mouth daily. 30 tablet 0   magnesium oxide (MAG-OX) 400 MG tablet Take 1 tablet (400 mg total) by mouth daily. 30 tablet 3   meclizine (ANTIVERT) 25 MG tablet Take 1 tablet (25 mg total) by mouth 2 (two) times daily as needed for dizziness. 30 tablet 3   megestrol (MEGACE) 400 MG/10ML suspension Take 10 mLs (400 mg total) by mouth 2 (two) times daily. 480 mL 2   metoprolol succinate (TOPROL-XL) 25 MG 24 hr tablet Take 25 mg by mouth daily.     Multiple Minerals (CALCIUM/MAGNESIUM/ZINC PO) Take 1 tablet by mouth at bedtime.     ondansetron (ZOFRAN) 4 MG tablet Take 4 mg by mouth every 8 (eight) hours as needed for nausea.     pantoprazole (PROTONIX) 40 MG tablet  Take 1 tablet (40 mg total) by mouth daily. 30 tablet 6   potassium chloride (KLOR-CON) 10 MEQ tablet Take 1 tablet (10 mEq total) by mouth 2 (two) times daily. 90 tablet 3   prochlorperazine (COMPAZINE) 10 MG tablet Take 1 tablet (10 mg total) by mouth every 6 (six) hours as needed for nausea Miranda vomiting. 60 tablet 3   sacubitril-valsartan (ENTRESTO) 24-26 MG Take 1 tablet by mouth 2 (two) times daily.     scopolamine (TRANSDERM-SCOP) 1 MG/3DAYS      sildenafil (REVATIO) 20 MG tablet Take 20 mg by mouth daily.     traMADol (ULTRAM) 50 MG tablet TAKE ONE TABLET BY MOUTH EVERY 8 HOURS AS NEEDED FOR SEVERE PAIN 60 tablet 0   vitamin B-12 (CYANOCOBALAMIN) 1000 MCG tablet Take 1,000 mcg by mouth daily.     zinc gluconate 50 MG tablet Take 50 mg by mouth daily.      No current facility-administered medications for this visit.   Facility-Administered Medications Ordered in Other Visits  Medication Dose Route Frequency Provider Last Rate Last Admin   0.9 %  sodium chloride infusion (Manually program via Guardrails IV Fluids)  250 mL Intravenous Once Pennington, Rebekah M, PA-C       acetaminophen (TYLENOL) tablet 650 mg  650 mg Oral Once Pennington, Rebekah M, PA-C       diphenhydrAMINE (BENADRYL) capsule 25 mg  25 mg Oral Once Pennington, Rebekah M, PA-C       heparin lock flush 100 unit/mL  500 Units Intracatheter Daily PRN Pennington, Rebekah M, PA-C       sodium chloride flush (NS) 0.9 % injection 10 mL  10 mL Intracatheter PRN Pennington, Rebekah M, PA-C        ALLERGIES:  Allergies  Allergen Reactions   Vancomycin     Other reaction(s): Red Man Syndrome Noted intraoperatively on 10/04/2018. No associated hemodynamic Miranda respiratory changes. Please give slowly.    PHYSICAL EXAM:  Performance status (ECOG): 1 - Symptomatic but completely ambulatory  Vitals:   03/05/21 0940  BP: (!) 145/96  Pulse: 84  Resp: 18  Temp: 97.8 F (36.6 C)  SpO2: 99%   Wt Readings from Last 3 Encounters:   03/05/21 189 lb 8 oz (86 kg)  03/03/21 189 lb 4.8 oz (85.9 kg)  02/05/21 182 lb 15.7 oz (83 kg)   Physical Exam Vitals reviewed.  Constitutional:      Appearance: Normal appearance.  Cardiovascular:     Rate and Rhythm: Normal rate and regular rhythm.     Pulses: Normal pulses.     Heart sounds: Normal heart sounds.  Pulmonary:     Effort: Pulmonary effort is normal.     Breath sounds: Normal breath sounds.  Musculoskeletal:     Right lower leg: No edema.     Left lower leg: No edema.  Neurological:     General: No focal deficit present.     Mental Status: He is alert and oriented to person, place, and time.  Psychiatric:        Mood and Affect: Mood normal.        Behavior: Behavior normal.    LABORATORY DATA:  I have reviewed the labs as listed.  CBC Latest Ref Rng & Units 03/05/2021 02/05/2021 01/08/2021  WBC 4.0 - 10.5 K/uL 11.9(H) 11.8(H) 11.8(H)  Hemoglobin 13.0 - 17.0 g/dL 12.6(L) 13.2 12.5(L)  Hematocrit 39.0 - 52.0 % 37.9(L) 40.4 38.7(L)  Platelets 150 - 400 K/uL 328 300 287   CMP Latest Ref Rng & Units 02/05/2021 01/08/2021 12/11/2020  Glucose 70 - 99 mg/dL 95 117(H) 99  BUN 8 - 23 mg/dL 26(H) 24(H) 27(H)  Creatinine 0.61 - 1.24 mg/dL 1.97(H) 1.91(H) 2.14(H)  Sodium 135 - 145 mmol/L 138 138 139  Potassium 3.5 - 5.1 mmol/L 4.2 3.8 4.1  Chloride 98 - 111 mmol/L 104 104 102  CO2 22 - 32 mmol/L 26 27 28   Calcium 8.9 - 10.3 mg/dL 9.0 8.6(L) 8.7(L)  Total Protein 6.5 - 8.1 g/dL 6.3(L) 5.9(L) 5.7(L)  Total Bilirubin 0.3 - 1.2 mg/dL 0.4 0.8 0.4  Alkaline Phos 38 - 126 U/L 99 94 88  AST 15 - 41 U/L 14(L) 12(L) 14(L)  ALT 0 - 44 U/L 12 10 16     DIAGNOSTIC IMAGING:  I have independently reviewed the scans and discussed with the patient. VAS [place] ABI WITH/WO TBI  Result Date: 03/03/2021  LOWER EXTREMITY DOPPLER STUDY Patient Name:  KAHRON KAUTH  Date of Exam:   03/03/2021 Medical Rec #: [id number]       Accession #:    [id number] Date of Birth: 10-31-1950       Patient  Gender: M Patient Age:   1 years Exam Location:  [facility] Procedure:      VAS [place] ABI WITH/WO TBI Referring Phys: Jamelle Haring --------------------------------------------------------------------------------  Indications: Peripheral artery disease, and Bilateral popliteal artery              aneurysms.  Vascular Interventions: Right above knee to below knee popliteal artery bypass                         graft on 05/24/17. Left above knee to below knee                         popliteal bypass graft 04/09/2019. Performing Technologist: Alvia Grove RVT  Examination Guidelines: A complete evaluation includes at minimum, Doppler waveform signals and systolic blood pressure reading at the level of bilateral brachial, anterior tibial, and posterior tibial arteries, when vessel segments are accessible. Bilateral  testing is considered an integral part of a complete examination. Photoelectric Plethysmograph (PPG) waveforms and toe systolic pressure readings are included as required and additional duplex testing as needed. Limited examinations for reoccurring indications may be performed as noted.  ABI Findings: +---------+------------------+-----+---------+--------+ Right    Rt Pressure (mmHg)IndexWaveform Comment  +---------+------------------+-----+---------+--------+ Brachial 127                                      +---------+------------------+-----+---------+--------+ PTA      133               1.02 triphasic         +---------+------------------+-----+---------+--------+ DP       156               1.19 triphasic         +---------+------------------+-----+---------+--------+ Great Toe110               0.84 Normal            +---------+------------------+-----+---------+--------+ +---------+------------------+-----+---------+-------+ Left     Lt Pressure (mmHg)IndexWaveform Comment +---------+------------------+-----+---------+-------+ Brachial 131                                      +---------+------------------+-----+---------+-------+ PTA      154               1.18 triphasic        +---------+------------------+-----+---------+-------+ DP       158               1.21 triphasic        +---------+------------------+-----+---------+-------+ Great Toe116               0.89 Normal           +---------+------------------+-----+---------+-------+ +-------+-----------+-----------+------------+------------+ ABI/TBIToday's ABIToday's TBIPrevious ABIPrevious TBI +-------+-----------+-----------+------------+------------+ Right  1.19       0.84       0.90        0.73         +-------+-----------+-----------+------------+------------+ Left   1.21       0.89       0.92        0.76         +-------+-----------+-----------+------------+------------+  Summary: Right: Resting right ankle-brachial index is within normal range. No evidence of significant right lower extremity arterial disease. The right toe-brachial index is normal. Left: Resting left ankle-brachial index is within normal range. No evidence of significant left lower extremity arterial disease. The left toe-brachial index is normal.  *See table(s) above for measurements and observations.  Electronically signed by Monica Martinez MD on 03/03/2021 at 3:28:45 PM.    Final    VAS [place] LOWER EXTREMITY BYPASS GRAFT DUPL  Result Date: 03/03/2021 LOWER EXTREMITY ARTERIAL DUPLEX STUDY Patient Name:  MIKALE SILVERSMITH  Date of Exam:   03/03/2021 Medical Rec #: [id number]       Accession #:    [id number] Date of Birth: [birth year]       Patient Gender: M Patient Age:   16 years Exam Location:  [facility] Procedure:      VAS [place] LOWER EXTREMITY BYPASS GRAFT DUPLEX Referring Phys: Jamelle Haring --------------------------------------------------------------------------------  Indications: Peripheral artery disease, and Bilateral popliteal artery              aneurysms.  Vascular  Interventions: Right  above knee to below knee popliteal artery bypass                         graft on 05/24/17. Current ABI:            Right ABI 1.19 Left 1.21 Performing Technologist: Alvia Grove RVT  Examination Guidelines: A complete evaluation includes B-mode imaging, spectral Doppler, color Doppler, and power Doppler as needed of all accessible portions of each vessel. Bilateral testing is considered an integral part of a complete examination. Limited examinations for reoccurring indications may be performed as noted.   Right Graft #1: +------------------+--------+--------+---------+--------+                   PSV cm/sStenosisWaveform Comments +------------------+--------+--------+---------+--------+ Inflow            43              triphasic         +------------------+--------+--------+---------+--------+ Prox Anastomosis  49              triphasic         +------------------+--------+--------+---------+--------+ Proximal Graft    79              triphasic         +------------------+--------+--------+---------+--------+ Mid Graft         112             triphasic         +------------------+--------+--------+---------+--------+ Distal Graft      100             triphasic         +------------------+--------+--------+---------+--------+ Distal Anastomosis104             triphasic         +------------------+--------+--------+---------+--------+ Outflow           70              triphasic         +------------------+--------+--------+---------+--------+ 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  Left Graft #1: +--------------------+--------+--------+---------+--------+                     PSV cm/sStenosisWaveform Comments +--------------------+--------+--------+---------+--------+ Inflow              66              triphasic         +--------------------+--------+--------+---------+--------+ Proximal Anastomosis88              triphasic          +--------------------+--------+--------+---------+--------+ Proximal Graft      94              triphasic         +--------------------+--------+--------+---------+--------+ Mid Graft           130             triphasic         +--------------------+--------+--------+---------+--------+ Distal Graft        90              triphasic         +--------------------+--------+--------+---------+--------+ Distal Anastomosis  93              triphasic         +--------------------+--------+--------+---------+--------+ Outflow             38              triphasic         +--------------------+--------+--------+---------+--------+  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  See table(s) above for measurements and observations. Electronically signed by Monica Martinez MD on 03/03/2021 at 3:28:29 PM.    Final      ASSESSMENT:  1.  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 Calton Dach -Dr. Joylene Grapes at [facility]-SPEP 0.2 g, immunofixation with biclonal IgG with lambda specificity. -Free kappa light chain 16.7, lambda light chains 122.7, ratio 0.14. -Kidney biopsy on 02/04/2020 with AL amyloidosis, lambda immunophenotype, Congo red stain positive.  Immunofluorescence microscopy shows dense homogeneous glomerular mesangial staining with antisera specific for IgG 1-2+, IgM 2+, C1q 2+, lambda light chains 3+.  Waxy homogeneous staining noted along the blood vessels.  Congo red stain demonstrates positive apocrine birefringence within the glomerular mesangial regions and along blood vessel walls. -48 pound weight loss since summer 2021 with decreased appetite.  Positive for fatigue. -Reports occasional nausea, no vomiting.  Positive for occipital headaches. -Denies any tingling Miranda numbness in the extremities.  He has  cramps in the left arm which is new.  He had leg cramps for a long time. -Cardiac MRI on 02/29/2020 with EF 38%, no evidence of amyloid deposits in the myocardium. -PET scan on 02/26/2020 with scattered borderline mediastinal and hilar lymph nodes with low-level hypermetabolism most likely inflammatory Miranda reactive.  No bone abnormalities.  No findings in the abdomen Miranda pelvis. -Bone marrow biopsy on 02/26/2020 with slightly hypercellular bone marrow with 23% cells consistent with plasma cells, staining for lambda light chain.  No lymphoproliferative disorder.  Congo red stain was negative. -Bone marrow cytogenetics were normal.  FISH panel for multiple myeloma was negative. -M spike was 0.1 g, immunofixation shows IgG lambda.  LDH was elevated at 216.  Beta-2 microglobulin 4.5. - 6 cycles of Dara CyBorD from 03/20/2020 through 09/04/2020.  Currently on maintenance daratumumab.   2.  Social/family history: -He works for [employer] and retired 5 years ago. Never smoker. -Maternal aunt had cancer in male organs.  One maternal first cousin had appendiceal cancer, another maternal first cousin had cancer of the digestive system.   PLAN:  1.  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  Free light chain ratio is 0.39 and lambda light chains 32.  Immunofixation shows IgG lambda. - 24-hour urine shows total protein 13.5 g.  This is more Miranda less stable. - Reviewed CBC today which was grossly within normal limits.  LDH was mildly elevated at 206.  LFTs are normal with mild albumin low at 3.1. - Recommend proceeding with Darzalex today.  Reevaluate in 4 weeks for follow-up.  We will plan to check 24-hour urine at that time.     2.  Leg pains: - He reports slight worsening in the leg pains in the afternoons for the last 2 to 3 weeks. - He is having to take tramadol 2 tablets  and hydrocodone 2 tablets.  Previously was taking 1 tablet of each.  Denies any falls.   3.  Leg weakness/pains: - Left sural nerve biopsy was consistent with demyelination but not amyloidosis.  Leg strength is stable.   4.  Elevated creatinine: - Creatinine is 2.22 today.   5.  Loss of appetite: - He is not on  any appetite stimulants.  Appetite is good.   6.  Lower extremity swelling: - Torsemide on hold.  He gained about 7 pounds since first week of November. - He will talk to Dr. Alroy Dust to restart torsemide as needed.  7.  Acid reflux: - Continue Protonix.  Continue Zantac/Tums as needed.   Orders placed this encounter:  No orders of the defined types were placed in this encounter.    Derek Jack, MD [place] [phone]   I, Thana Ates, am acting as a scribe for Dr. Derek Jack.  I, Derek Jack MD, have reviewed the above documentation for accuracy and completeness, and I agree with the above.

## 2021-03-05 ENCOUNTER — Inpatient Hospital Stay (HOSPITAL_BASED_OUTPATIENT_CLINIC_OR_DEPARTMENT_OTHER): Payer: Medicare Other | Admitting: Hematology

## 2021-03-05 ENCOUNTER — Inpatient Hospital Stay (HOSPITAL_COMMUNITY): Payer: Medicare Other | Attending: Hematology

## 2021-03-05 ENCOUNTER — Other Ambulatory Visit: Payer: Self-pay

## 2021-03-05 ENCOUNTER — Encounter (HOSPITAL_COMMUNITY): Payer: Self-pay | Admitting: Hematology

## 2021-03-05 ENCOUNTER — Inpatient Hospital Stay (HOSPITAL_COMMUNITY): Payer: Medicare Other

## 2021-03-05 VITALS — BP 145/96 | HR 84 | Temp 97.8°F | Resp 18 | Wt 189.5 lb

## 2021-03-05 DIAGNOSIS — Z7952 Long term (current) use of systemic steroids: Secondary | ICD-10-CM | POA: Insufficient documentation

## 2021-03-05 DIAGNOSIS — I252 Old myocardial infarction: Secondary | ICD-10-CM | POA: Diagnosis not present

## 2021-03-05 DIAGNOSIS — R944 Abnormal results of kidney function studies: Secondary | ICD-10-CM | POA: Diagnosis not present

## 2021-03-05 DIAGNOSIS — Z5112 Encounter for antineoplastic immunotherapy: Secondary | ICD-10-CM | POA: Insufficient documentation

## 2021-03-05 DIAGNOSIS — R630 Anorexia: Secondary | ICD-10-CM | POA: Insufficient documentation

## 2021-03-05 DIAGNOSIS — C9000 Multiple myeloma not having achieved remission: Secondary | ICD-10-CM

## 2021-03-05 DIAGNOSIS — R110 Nausea: Secondary | ICD-10-CM | POA: Diagnosis not present

## 2021-03-05 DIAGNOSIS — R5381 Other malaise: Secondary | ICD-10-CM | POA: Diagnosis not present

## 2021-03-05 DIAGNOSIS — E8581 Light chain (AL) amyloidosis: Secondary | ICD-10-CM | POA: Insufficient documentation

## 2021-03-05 DIAGNOSIS — Z23 Encounter for immunization: Secondary | ICD-10-CM | POA: Insufficient documentation

## 2021-03-05 DIAGNOSIS — M7989 Other specified soft tissue disorders: Secondary | ICD-10-CM | POA: Diagnosis not present

## 2021-03-05 DIAGNOSIS — I2510 Atherosclerotic heart disease of native coronary artery without angina pectoris: Secondary | ICD-10-CM | POA: Insufficient documentation

## 2021-03-05 DIAGNOSIS — I10 Essential (primary) hypertension: Secondary | ICD-10-CM | POA: Diagnosis not present

## 2021-03-05 DIAGNOSIS — M79606 Pain in leg, unspecified: Secondary | ICD-10-CM | POA: Insufficient documentation

## 2021-03-05 DIAGNOSIS — I739 Peripheral vascular disease, unspecified: Secondary | ICD-10-CM | POA: Diagnosis not present

## 2021-03-05 DIAGNOSIS — Z8673 Personal history of transient ischemic attack (TIA), and cerebral infarction without residual deficits: Secondary | ICD-10-CM | POA: Diagnosis not present

## 2021-03-05 DIAGNOSIS — K219 Gastro-esophageal reflux disease without esophagitis: Secondary | ICD-10-CM | POA: Insufficient documentation

## 2021-03-05 DIAGNOSIS — Z7901 Long term (current) use of anticoagulants: Secondary | ICD-10-CM | POA: Insufficient documentation

## 2021-03-05 LAB — COMPREHENSIVE METABOLIC PANEL
ALT: 10 U/L (ref 0–44)
AST: 11 U/L — ABNORMAL LOW (ref 15–41)
Albumin: 3.1 g/dL — ABNORMAL LOW (ref 3.5–5.0)
Alkaline Phosphatase: 99 U/L (ref 38–126)
Anion gap: 7 (ref 5–15)
BUN: 28 mg/dL — ABNORMAL HIGH (ref 8–23)
CO2: 24 mmol/L (ref 22–32)
Calcium: 8.9 mg/dL (ref 8.9–10.3)
Chloride: 108 mmol/L (ref 98–111)
Creatinine, Ser: 2.22 mg/dL — ABNORMAL HIGH (ref 0.61–1.24)
GFR, Estimated: 31 mL/min — ABNORMAL LOW (ref >60)
Glucose, Bld: 106 mg/dL — ABNORMAL HIGH (ref 70–99)
Potassium: 3.9 mmol/L (ref 3.5–5.1)
Sodium: 139 mmol/L (ref 135–145)
Total Bilirubin: 0.6 mg/dL (ref 0.3–1.2)
Total Protein: 6.4 g/dL — ABNORMAL LOW (ref 6.5–8.1)

## 2021-03-05 LAB — CBC WITH DIFFERENTIAL/PLATELET
Abs Immature Granulocytes: 0.06 10*3/uL (ref 0.00–0.07)
Basophils Absolute: 0.1 10*3/uL (ref 0.0–0.1)
Basophils Relative: 0 %
Eosinophils Absolute: 0.9 10*3/uL — ABNORMAL HIGH (ref 0.0–0.5)
Eosinophils Relative: 7 %
HCT: 37.9 % — ABNORMAL LOW (ref 39.0–52.0)
Hemoglobin: 12.6 g/dL — ABNORMAL LOW (ref 13.0–17.0)
Immature Granulocytes: 1 %
Lymphocytes Relative: 13 %
Lymphs Abs: 1.5 10*3/uL (ref 0.7–4.0)
MCH: 33.4 pg (ref 26.0–34.0)
MCHC: 33.2 g/dL (ref 30.0–36.0)
MCV: 100.5 fL — ABNORMAL HIGH (ref 80.0–100.0)
Monocytes Absolute: 0.5 10*3/uL (ref 0.1–1.0)
Monocytes Relative: 5 %
Neutro Abs: 8.9 10*3/uL — ABNORMAL HIGH (ref 1.7–7.7)
Neutrophils Relative %: 74 %
Platelets: 328 10*3/uL (ref 150–400)
RBC: 3.77 MIL/uL — ABNORMAL LOW (ref 4.22–5.81)
RDW: 15.7 % — ABNORMAL HIGH (ref 11.5–15.5)
WBC: 11.9 10*3/uL — ABNORMAL HIGH (ref 4.0–10.5)
nRBC: 0 % (ref 0.0–0.2)

## 2021-03-05 LAB — MAGNESIUM: Magnesium: 1.8 mg/dL (ref 1.7–2.4)

## 2021-03-05 LAB — LACTATE DEHYDROGENASE: LDH: 206 U/L — ABNORMAL HIGH (ref 98–192)

## 2021-03-05 MED ORDER — HEPARIN SOD (PORK) LOCK FLUSH 100 UNIT/ML IV SOLN
500.0000 [IU] | Freq: Once | INTRAVENOUS | Status: AC
  Administered 2021-03-05: 500 [IU] via INTRAVENOUS

## 2021-03-05 MED ORDER — DARATUMUMAB-HYALURONIDASE-FIHJ 1800-30000 MG-UT/15ML ~~LOC~~ SOLN
1800.0000 mg | Freq: Once | SUBCUTANEOUS | Status: AC
  Administered 2021-03-05: 1800 mg via SUBCUTANEOUS
  Filled 2021-03-05: qty 15

## 2021-03-05 MED ORDER — INFLUENZA VAC A&B SA ADJ QUAD 0.5 ML IM PRSY
0.5000 mL | PREFILLED_SYRINGE | Freq: Once | INTRAMUSCULAR | Status: AC
  Administered 2021-03-05: 0.5 mL via INTRAMUSCULAR
  Filled 2021-03-05: qty 0.5

## 2021-03-05 MED ORDER — SODIUM CHLORIDE 0.9% FLUSH
10.0000 mL | Freq: Once | INTRAVENOUS | Status: AC
  Administered 2021-03-05: 10 mL via INTRAVENOUS

## 2021-03-05 NOTE — Patient Instructions (Addendum)
[facility] Discharge Instructions   You were seen and examined today by Dr. Delton Coombes. He reviewed your lab work with you which is normal/stable. We will proceed with treatment today. Return as scheduled in 4 week with labs, office visit, and treatment.     Thank you for choosing [facility] to provide your oncology and hematology care.  To afford each patient quality time with our provider, please arrive at least 15 minutes before your scheduled appointment time.   If you have a lab appointment with the [facility] please come in thru the Main Entrance and check in at the main information desk.  You need to re-schedule your appointment should you arrive 10 or more minutes late.  We strive to give you quality time with our providers, and arriving late affects you and other patients whose appointments are after yours.  Also, if you no show three or more times for appointments you may be dismissed from the clinic at the providers discretion.     Again, thank you for choosing [facility].  Our hope is that these requests will decrease the amount of time that you wait before being seen by our physicians.       _____________________________________________________________  Should you have questions after your visit to [facility], please contact our office at [phone] and follow the prompts.  Our office hours are 8:00 a.m. and 4:30 p.m. Monday - Friday.  Please note that voicemails left after 4:00 p.m. may not be returned until the following business day.  We are closed weekends and major holidays.  You do have access to a nurse 24-7, just call the main number to the clinic [phone] and do not press any options, hold on the line and a nurse will answer the phone.    For prescription refill requests, have your pharmacy contact our office and allow 72 hours.    Due to Covid, you will need  to wear a mask upon entering the hospital. If you do not have a mask, a mask will be given to you at the Main Entrance upon arrival. For doctor visits, patients may have 1 support person age 48 or older with them. For treatment visits, patients can not have anyone with them due to social distancing guidelines and our immunocompromised population.

## 2021-03-05 NOTE — Progress Notes (Signed)
Patient presents today for Daratumumab injection and influenza vaccination  per providers order.  Vital signs and labs within parameters within parameters for treatment. Patient takes premedications at home.  Administration without incident; injection sites WNL; see MAR for injection details.  Patient tolerated procedure well and without incident.  No questions or complaints noted at this time.  Patient declined AVS. Discharge from clinic ambulatory in stable condition.   Alert and oriented X 3.  Follow up with [facility] as scheduled.

## 2021-03-05 NOTE — Progress Notes (Signed)
Patient has been examined, vital signs and labs have been reviewed by Dr. Katragadda. ANC, Creatinine, LFTs, hemoglobin, and platelets are within treatment parameters per Dr. Katragadda. Patient may proceed with treatment per M.D.   

## 2021-03-05 NOTE — Patient Instructions (Signed)
[facility]  Discharge Instructions: Thank you for choosing [facility] to provide your oncology and hematology care.  If you have a lab appointment with the [facility], please come in thru the Main Entrance and check in at the main information desk.  Wear comfortable clothing and clothing appropriate for easy access to any Portacath or PICC line.   We strive to give you quality time with your provider. You may need to reschedule your appointment if you arrive late (15 or more minutes).  Arriving late affects you and other patients whose appointments are after yours.  Also, if you miss three or more appointments without notifying the office, you may be dismissed from the clinic at the provider's discretion.      For prescription refill requests, have your pharmacy contact our office and allow 72 hours for refills to be completed.    Today you received the following chemotherapy and/or immunotherapy agents Daratumumab and influenza vaccination.      To help prevent nausea and vomiting after your treatment, we encourage you to take your nausea medication as directed.  BELOW ARE SYMPTOMS THAT SHOULD BE REPORTED IMMEDIATELY: *FEVER GREATER THAN 100.4 F (38 C) OR HIGHER *CHILLS OR SWEATING *NAUSEA AND VOMITING THAT IS NOT CONTROLLED WITH YOUR NAUSEA MEDICATION *UNUSUAL SHORTNESS OF BREATH *UNUSUAL BRUISING OR BLEEDING *URINARY PROBLEMS (pain or burning when urinating, or frequent urination) *BOWEL PROBLEMS (unusual diarrhea, constipation, pain near the anus) TENDERNESS IN MOUTH AND THROAT WITH OR WITHOUT PRESENCE OF ULCERS (sore throat, sores in mouth, or a toothache) UNUSUAL RASH, SWELLING OR PAIN  UNUSUAL VAGINAL DISCHARGE OR ITCHING   Items with * indicate a potential emergency and should be followed up as soon as possible or go to the Emergency Department if any problems should occur.  Please show the CHEMOTHERAPY ALERT CARD or IMMUNOTHERAPY ALERT CARD at  check-in to the Emergency Department and triage nurse.  Should you have questions after your visit or need to cancel or reschedule your appointment, please contact [facility] [phone]  and follow the prompts.  Office hours are 8:00 a.m. to 4:30 p.m. Monday - Friday. Please note that voicemails left after 4:00 p.m. may not be returned until the following business day.  We are closed weekends and major holidays. You have access to a nurse at all times for urgent questions. Please call the main number to the clinic [phone] and follow the prompts.  For any non-urgent questions, you may also contact your provider using MyChart. We now offer e-Visits for anyone 33 and older to request care online for non-urgent symptoms. For details visit [email or web address].   Also download the MyChart app! Go to the app store, search "MyChart", open the app, select [facility], and log in with your MyChart username and password.  Due to Covid, a mask is required upon entering the hospital/clinic. If you do not have a mask, one will be given to you upon arrival. For doctor visits, patients may have 1 support person aged 24 or older with them. For treatment visits, patients cannot have anyone with them due to current Covid guidelines and our immunocompromised population.

## 2021-03-05 NOTE — Progress Notes (Signed)
Patients port flushed without difficulty.  Good blood return noted with no bruising or swelling noted at site.  Patient remains accessed for chemotherapy treatment. Patient is in stable condition.  °

## 2021-03-06 ENCOUNTER — Other Ambulatory Visit (HOSPITAL_COMMUNITY): Payer: Self-pay | Admitting: *Deleted

## 2021-03-06 LAB — KAPPA/LAMBDA LIGHT CHAINS
Kappa free light chain: 15.5 mg/L (ref 3.3–19.4)
Kappa, lambda light chain ratio: 0.41 (ref 0.26–1.65)
Lambda free light chains: 37.6 mg/L — ABNORMAL HIGH (ref 5.7–26.3)

## 2021-03-09 ENCOUNTER — Encounter (HOSPITAL_COMMUNITY): Payer: Self-pay | Admitting: Hematology and Oncology

## 2021-03-09 MED ORDER — HYDROCODONE-ACETAMINOPHEN 5-325 MG PO TABS
1.0000 | ORAL_TABLET | Freq: Three times a day (TID) | ORAL | 0 refills | Status: DC | PRN
Start: 2021-03-09 — End: 2021-03-23

## 2021-03-09 MED ORDER — TRAMADOL HCL 50 MG PO TABS
ORAL_TABLET | ORAL | 0 refills | Status: DC
Start: 2021-03-09 — End: 2021-04-22

## 2021-03-10 LAB — PROTEIN ELECTROPHORESIS, SERUM
A/G Ratio: 1 (ref 0.7–1.7)
Albumin ELP: 3.6 g/dL (ref 2.9–4.4)
Alpha-1-Globulin: 0.4 g/dL (ref 0.0–0.4)
Alpha-2-Globulin: 1.8 g/dL — ABNORMAL HIGH (ref 0.4–1.0)
Beta Globulin: 1 g/dL (ref 0.7–1.3)
Gamma Globulin: 0.4 g/dL (ref 0.4–1.8)
Globulin, Total: 3.5 g/dL (ref 2.2–3.9)
M-Spike, %: 0.1 g/dL — ABNORMAL HIGH
Total Protein ELP: 7.1 g/dL (ref 6.0–8.5)

## 2021-03-10 LAB — IMMUNOFIXATION ELECTROPHORESIS
IgA: 16 mg/dL — ABNORMAL LOW (ref 61–437)
IgG (Immunoglobin G), Serum: 367 mg/dL — ABNORMAL LOW (ref 603–1613)
IgM (Immunoglobulin M), Srm: 45 mg/dL (ref 15–143)
Total Protein ELP: 5.8 g/dL — ABNORMAL LOW (ref 6.0–8.5)

## 2021-03-23 ENCOUNTER — Other Ambulatory Visit (HOSPITAL_COMMUNITY): Payer: Self-pay

## 2021-03-23 MED ORDER — HYDROCODONE-ACETAMINOPHEN 5-325 MG PO TABS: 1.0000 | ORAL_TABLET | Freq: Three times a day (TID) | ORAL | 0 refills | Status: DC | PRN

## 2021-03-23 MED ORDER — ALPRAZOLAM 0.25 MG PO TABS: 0.2500 mg | ORAL_TABLET | Freq: Four times a day (QID) | ORAL | 0 refills | Status: DC

## 2021-03-31 ENCOUNTER — Other Ambulatory Visit (HOSPITAL_COMMUNITY): Payer: Self-pay

## 2021-03-31 MED ORDER — DEXAMETHASONE 4 MG PO TABS: ORAL_TABLET | ORAL | 11 refills | Status: DC

## 2021-04-01 NOTE — Progress Notes (Signed)
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  PRIOR THERAPY: none  NGS Results: not done  CURRENT THERAPY: DaraCyBorD & Aloxi weekly  BRIEF ONCOLOGIC HISTORY:  Oncology History  Multiple myeloma ([place])  03/06/2020 Initial Diagnosis   Multiple myeloma ([place])   03/06/2020 Cancer Staging   Staging form: Plasma Cell Myeloma and Plasma Cell Disorders, AJCC 8th Edition - Clinical stage from 03/06/2020: RISS Stage II (Beta-2-microglobulin (mg/L): 4.5, Albumin (g/dL): 2.5, ISS: Stage II, High-risk cytogenetics: Absent, LDH: Elevated) - Signed by Derek Jack, MD on 03/06/2020    03/20/2020 - 03/20/2020 Chemotherapy   The patient had dexamethasone (DECADRON) tablet 40 mg, 40 mg, Oral,  Once, 0 of 4 cycles bortezomib SQ (VELCADE) chemo injection (2.40m/mL concentration) 2.75 mg, 1.3 mg/m2 = 2.75 mg, Subcutaneous,  Once, 0 of 4 cycles   for chemotherapy treatment.     03/20/2020 -  Chemotherapy   Patient is on Treatment Plan : PRIMARY AMYLOIDOSIS DaraCyBorD (Daratumumab SQ + Cyclophosphamide PO + Bortezomib SQ + Dexamethasone PO/IV) q28d x 6 cycles / Daratumumab SQ q28d       CANCER STAGING:  Cancer Staging  Multiple myeloma ([facility] Staging form: Plasma Cell Myeloma and Plasma Cell Disorders, AJCC 8th Edition - Clinical stage from 03/06/2020: RISS Stage II (Beta-2-microglobulin (mg/L): 4.5, Albumin (g/dL): 2.5, ISS: Stage II, High-risk cytogenetics: Absent, LDH: Elevated) - Signed by KDerek Jack MD on 03/06/2020   INTERVAL HISTORY:  [name] a 70y.o. male, returns for routine follow-up and consideration for next cycle of chemotherapy. Julian Miranda last seen on 03/05/2021.  Due for cycle #15 of DaraCyBorD & Aloxi today.   Overall, he tells me he has been feeling pretty well. His appetite  is good, and he is no longer taking Megace. He has lost 4 lbs since his last visit. He reports continued pain in his legs bilaterally in the afternoons and evenings. He is sleeping well and frequently. His activity level have improved. He denies n/v/d. He is taking Eliquis and reports easy bruising. He denies any current bleeding.   Overall, he feels ready for next cycle of chemo today.   REVIEW OF SYSTEMS:  Review of Systems  Constitutional:  Positive for unexpected weight change (-4 lbs). Negative for appetite change and fatigue (80%).  HENT:   Negative for nosebleeds.   Respiratory:  Negative for hemoptysis.   Gastrointestinal:  Negative for blood in stool, diarrhea, nausea and vomiting.  Genitourinary:  Negative for hematuria.   Musculoskeletal:  Positive for arthralgias (6/10 legs bilaterally).  Hematological:  Bruises/bleeds easily.  All other systems reviewed and are negative.  PAST MEDICAL/SURGICAL HISTORY:  Past Medical History:  Diagnosis Date   CAD (coronary artery disease)    GERD (gastroesophageal reflux disease)    Hypertension    Myocardial infarction ([facility] 1994   Peripheral vascular disease ([facility]    Stroke ([facility]    Past Surgical History:  Procedure Laterality Date   BYPASS GRAFT POPLITEAL TO POPLITEAL Right 03/23/2018   Procedure: BYPASS GRAFT RIGHT ABOVE KNEE POPLITEAL TO BELOW KNEE POPLITEAL ARTERY  USING RIGHT GREAT SAPHENOUS VEIN;  Surgeon: CMarty Heck MD;  [facility] [facility]  Service: Vascular;  Laterality: Right;   BYPASS GRAFT POPLITEAL TO POPLITEAL Left 04/09/2019   Procedure: BYPASS  ABOVE KNEE POPLITEAL TO BELOW KNEE POPLITEAL  AND LIGATION OF LEFT POPITEAL ANEURYSM;  Surgeon: Marty Heck, MD;  Location: [facility] OR;  Service: Vascular;  Laterality: Left;   COLONOSCOPY WITH ESOPHAGOGASTRODUODENOSCOPY (EGD)     CORONARY ARTERY BYPASS GRAFT     EYE SURGERY Bilateral    cataracts   FEMORAL BYPASS Right 03/23/2018   HERNIA REPAIR     IR IMAGING  GUIDED PORT INSERTION  03/25/2020   MASTOIDECTOMY     ROTATOR CUFF REPAIR Right    TONSILLECTOMY     VEIN HARVEST Right 03/23/2018   Procedure: VEIN HARVEST RIGHT GREAT SAPHENOUS;  Surgeon: Marty Heck, MD;  [facility];  Service: Vascular;  Laterality: Right;   VEIN HARVEST Left 04/09/2019   Procedure: Harvest Greater Saphenous Vein and  Revise Elisa Lateral Saphenous Vein ;  Surgeon: Marty Heck, MD;  Location: [place];  Service: Vascular;  Laterality: Left;    SOCIAL HISTORY:  Social History   Socioeconomic History   Marital status: Married    Spouse name: Not on file   Number of children: Not on file   Years of education: Not on file   Highest education level: Not on file  Occupational History   Not on file  Tobacco Use   Smoking status: Never   Smokeless tobacco: Never  Vaping Use   Vaping Use: Never used  Substance and Sexual Activity   Alcohol use: Never   Drug use: Never   Sexual activity: Not on file  Other Topics Concern   Not on file  Social History Narrative   Not on file   Social Determinants of Health   Financial Resource Strain: Not on file  Food Insecurity: Not on file  Transportation Needs: Not on file  Physical Activity: Not on file  Stress: Not on file  Social Connections: Not on file  Intimate Partner Violence: Not on file    FAMILY HISTORY:  Family History  Problem Relation Age of Onset   Heart disease Mother     CURRENT MEDICATIONS:  Current Outpatient Medications  Medication Sig Dispense Refill   acetaminophen (TYLENOL) 500 MG tablet Take 500 mg by mouth 2 (two) times daily.     acyclovir (ZOVIRAX) 400 MG tablet Take 1 tablet (400 mg total) by mouth 2 (two) times daily. 60 tablet 5   ALPRAZolam (XANAX) 0.25 MG tablet Take 1 tablet (0.25 mg total) by mouth in the morning, at noon, in the evening, and at bedtime. 120 tablet 0   Apixaban (ELIQUIS PO) Take by mouth.     aspirin 81 MG tablet Take 81 mg by mouth daily.      atorvastatin (LIPITOR) 80 MG tablet Take 80 mg by mouth at bedtime.      carvedilol (COREG) 25 MG tablet Take 25 mg by mouth daily.     Dapagliflozin Propanediol (FARXIGA PO) Take by mouth.     dexamethasone (DECADRON) 4 MG tablet Take 30m (5 tabs) one hour prior to injection appointment every 28 days 5 tablet 11   ergocalciferol (VITAMIN D2) 1.25 MG (50000 UT) capsule Take 1 capsule (50,000 Units total) by mouth once a week. 8 capsule 3   gabapentin (NEURONTIN) 300 MG capsule Take 1 capsule (300 mg total) by mouth daily. 30 capsule 6   icosapent Ethyl (VASCEPA) 1 g capsule Take 1 g by mouth 2 (two) times daily.     Iron-Vitamin C (VITRON-C) 65-125 MG TABS Take 1 tablet by mouth daily. 60 tablet 3   magnesium oxide (MAG-OX)  400 (241.3 Mg) MG tablet Take 1 tablet (400 mg total) by mouth daily. 30 tablet 0   magnesium oxide (MAG-OX) 400 MG tablet Take 1 tablet (400 mg total) by mouth daily. 30 tablet 3   megestrol (MEGACE) 400 MG/10ML suspension Take 10 mLs (400 mg total) by mouth 2 (two) times daily. 480 mL 2   metoprolol succinate (TOPROL-XL) 25 MG 24 hr tablet Take 25 mg by mouth daily.     Multiple Minerals (CALCIUM/MAGNESIUM/ZINC PO) Take 1 tablet by mouth at bedtime.     pantoprazole (PROTONIX) 40 MG tablet Take 1 tablet (40 mg total) by mouth daily. 30 tablet 6   potassium chloride (KLOR-CON) 10 MEQ tablet Take 1 tablet (10 mEq total) by mouth 2 (two) times daily. 90 tablet 3   sacubitril-valsartan (ENTRESTO) 24-26 MG Take 1 tablet by mouth 2 (two) times daily.     scopolamine (TRANSDERM-SCOP) 1 MG/3DAYS      sildenafil (REVATIO) 20 MG tablet Take 20 mg by mouth daily.     vitamin B-12 (CYANOCOBALAMIN) 1000 MCG tablet Take 1,000 mcg by mouth daily.     zinc gluconate 50 MG tablet Take 50 mg by mouth daily.      chlorproMAZINE (THORAZINE) 25 MG tablet Take 1 tablet (25 mg total) by mouth 4 (four) times daily as needed. For hiccups (Patient not taking: Reported on 04/02/2021) 60 tablet 2    diazepam (VALIUM) 2 MG tablet TAKE ONE TABLET BY MOUTH DAILY AS NEEDED FOR DIZZINESS (Patient not taking: Reported on 04/02/2021) 30 tablet 5   HYDROcodone-acetaminophen (NORCO) 5-325 MG tablet Take 1 tablet by mouth every 8 (eight) hours as needed for moderate pain. (Patient not taking: Reported on 04/02/2021) 90 tablet 0   lidocaine (XYLOCAINE) 2 % solution Use as directed 15 mLs in the mouth or throat as needed for mouth pain. (Patient not taking: Reported on 04/02/2021) 15 mL 3   lidocaine-prilocaine (EMLA) cream Apply 1 application topically as needed. Apply to portacath as needed (Patient not taking: Reported on 04/02/2021) 30 g 0   meclizine (ANTIVERT) 25 MG tablet Take 1 tablet (25 mg total) by mouth 2 (two) times daily as needed for dizziness. (Patient not taking: Reported on 04/02/2021) 30 tablet 3   ondansetron (ZOFRAN) 4 MG tablet Take 4 mg by mouth every 8 (eight) hours as needed for nausea. (Patient not taking: Reported on 04/02/2021)     prochlorperazine (COMPAZINE) 10 MG tablet Take 1 tablet (10 mg total) by mouth every 6 (six) hours as needed for nausea or vomiting. (Patient not taking: Reported on 04/02/2021) 60 tablet 3   traMADol (ULTRAM) 50 MG tablet TAKE ONE TABLET BY MOUTH EVERY 8 HOURS AS NEEDED FOR SEVERE PAIN (Patient not taking: Reported on 04/02/2021) 60 tablet 0   No current facility-administered medications for this visit.   Facility-Administered Medications Ordered in Other Visits  Medication Dose Route Frequency Provider Last Rate Last Admin   0.9 %  sodium chloride infusion (Manually program via Guardrails IV Fluids)  250 mL Intravenous Once Pennington, Rebekah M, PA-C       acetaminophen (TYLENOL) tablet 650 mg  650 mg Oral Once Pennington, Rebekah M, PA-C       diphenhydrAMINE (BENADRYL) capsule 25 mg  25 mg Oral Once Pennington, Rebekah M, PA-C       heparin lock flush 100 unit/mL  500 Units Intracatheter Daily PRN Pennington, Rebekah M, PA-C       sodium chloride  flush (NS) 0.9 % injection  10 mL  10 mL Intracatheter PRN Pennington, Rebekah M, PA-C        ALLERGIES:  Allergies  Allergen Reactions   Vancomycin     Other reaction(s): Red Man Syndrome Noted intraoperatively on 10/04/2018. No associated hemodynamic or respiratory changes. Please give slowly.    PHYSICAL EXAM:  Performance status (ECOG): 1 - Symptomatic but completely ambulatory  Vitals:   04/02/21 0852  BP: (!) 139/95  Pulse: 85  Resp: 18  Temp: (!) 97.4 F (36.3 C)  SpO2: 99%   Wt Readings from Last 3 Encounters:  04/02/21 185 lb 3 oz (84 kg)  03/05/21 189 lb 8 oz (86 kg)  03/03/21 189 lb 4.8 oz (85.9 kg)   Physical Exam Vitals reviewed.  Constitutional:      Appearance: Normal appearance.  Cardiovascular:     Rate and Rhythm: Normal rate and regular rhythm.     Pulses: Normal pulses.     Heart sounds: Normal heart sounds.  Pulmonary:     Effort: Pulmonary effort is normal.     Breath sounds: Normal breath sounds.  Musculoskeletal:     Right lower leg: Edema (trace) present.     Left lower leg: Edema (trace) present.  Neurological:     General: No focal deficit present.     Mental Status: He is alert and oriented to person, place, and time.  Psychiatric:        Mood and Affect: Mood normal.        Behavior: Behavior normal.    LABORATORY DATA:  I have reviewed the labs as listed.  CBC Latest Ref Rng & Units 04/02/2021 03/05/2021 02/05/2021  WBC 4.0 - 10.5 K/uL 13.2(H) 11.9(H) 11.8(H)  Hemoglobin 13.0 - 17.0 g/dL 12.9(L) 12.6(L) 13.2  Hematocrit 39.0 - 52.0 % 39.9 37.9(L) 40.4  Platelets 150 - 400 K/uL 295 328 300   CMP Latest Ref Rng & Units 04/02/2021 03/05/2021 02/05/2021  Glucose 70 - 99 mg/dL 109(H) 106(H) 95  BUN 8 - 23 mg/dL 28(H) 28(H) 26(H)  Creatinine 0.61 - 1.24 mg/dL 2.20(H) 2.22(H) 1.97(H)  Sodium 135 - 145 mmol/L 137 139 138  Potassium 3.5 - 5.1 mmol/L 4.2 3.9 4.2  Chloride 98 - 111 mmol/L 102 108 104  CO2 22 - 32 mmol/L _0 Calcium  8.9 - 10.3 mg/dL 8.9 8.9 9.0  Total Protein 6.5 - 8.1 g/dL 6.2(L) 6.4(L) 6.3(L)  Total Bilirubin 0.3 - 1.2 mg/dL 0.5 0.6 0.4  Alkaline Phos 38 - 126 U/L 110 99 99  AST 15 - 41 U/L 13(L) 11(L) 14(L)  ALT 0 - 44 U/L _1 DIAGNOSTIC IMAGING:  I have independently reviewed the scans and discussed with the patient. VAS [place] ABI WITH/WO TBI  Result Date: 03/03/2021  LOWER EXTREMITY DOPPLER STUDY Patient Name:  Julian Miranda  Date of Exam:   03/03/2021 Medical Rec #: [id number]       Accession #:    [id number] Date of Birth: [birth year]       Patient Gender: M Patient Age:   88 years Exam Location:  [facility] Procedure:      VAS [place] ABI WITH/WO TBI Referring Phys: Jamelle Haring --------------------------------------------------------------------------------  Indications: Peripheral artery disease, and Bilateral popliteal artery              aneurysms.  Vascular Interventions: Right above knee to below knee popliteal artery bypass  graft on 05/24/17. Left above knee to below knee                         popliteal bypass graft 04/09/2019. Performing Technologist: Alvia Grove RVT  Examination Guidelines: A complete evaluation includes at minimum, Doppler waveform signals and systolic blood pressure reading at the level of bilateral brachial, anterior tibial, and posterior tibial arteries, when vessel segments are accessible. Bilateral testing is considered an integral part of a complete examination. Photoelectric Plethysmograph (PPG) waveforms and toe systolic pressure readings are included as required and additional duplex testing as needed. Limited examinations for reoccurring indications may be performed as noted.  ABI Findings: +---------+------------------+-----+---------+--------+  Right     Rt Pressure (mmHg) Index Waveform  Comment   +---------+------------------+-----+---------+--------+  Brachial  127                                           +---------+------------------+-----+---------+--------+  PTA       133                1.02  triphasic           +---------+------------------+-----+---------+--------+  DP        156                1.19  triphasic           +---------+------------------+-----+---------+--------+  Great Toe 110                0.84  Normal              +---------+------------------+-----+---------+--------+ +---------+------------------+-----+---------+-------+  Left      Lt Pressure (mmHg) Index Waveform  Comment  +---------+------------------+-----+---------+-------+  Brachial  131                                         +---------+------------------+-----+---------+-------+  PTA       154                1.18  triphasic          +---------+------------------+-----+---------+-------+  DP        158                1.21  triphasic          +---------+------------------+-----+---------+-------+  Great Toe 116                0.89  Normal             +---------+------------------+-----+---------+-------+ +-------+-----------+-----------+------------+------------+  ABI/TBI Today's ABI Today's TBI Previous ABI Previous TBI  +-------+-----------+-----------+------------+------------+  Right   1.19        0.84        0.90         0.73          +-------+-----------+-----------+------------+------------+  Left    1.21        0.89        0.92         0.76          +-------+-----------+-----------+------------+------------+  Summary: Right: Resting right ankle-brachial index is within normal range. No evidence of significant right lower extremity arterial disease. The right toe-brachial index is normal. Left: Resting left ankle-brachial index is within normal range. No evidence of  significant left lower extremity arterial disease. The left toe-brachial index is normal.  *See table(s) above for measurements and observations.  Electronically signed by Monica Martinez MD on 03/03/2021 at 3:28:45 PM.    Final    VAS [place] LOWER EXTREMITY BYPASS  GRAFT DUPL  Result Date: 03/03/2021 LOWER EXTREMITY ARTERIAL DUPLEX STUDY Patient Name:  Julian Miranda  Date of Exam:   03/03/2021 Medical Rec #: [id number]       Accession #:    [id number] Date of Birth: 11/01/1950       Patient Gender: M Patient Age:   70 years Exam Location:  [facility] Procedure:      VAS [place] LOWER EXTREMITY BYPASS GRAFT DUPLEX Referring Phys: Jamelle Haring --------------------------------------------------------------------------------  Indications: Peripheral artery disease, and Bilateral popliteal artery              aneurysms.  Vascular Interventions: Right above knee to below knee popliteal artery bypass                         graft on 05/24/17. Current ABI:            Right ABI 1.19 Left 1.21 Performing Technologist: Alvia Grove RVT  Examination Guidelines: A complete evaluation includes B-mode imaging, spectral Doppler, color Doppler, and power Doppler as needed of all accessible portions of each vessel. Bilateral testing is considered an integral part of a complete examination. Limited examinations for reoccurring indications may be performed as noted.   Right Graft #1: +------------------+--------+--------+---------+--------+                     PSV cm/s Stenosis Waveform  Comments  +------------------+--------+--------+---------+--------+  Inflow             43                triphasic           +------------------+--------+--------+---------+--------+  Prox Anastomosis   49                triphasic           +------------------+--------+--------+---------+--------+  Proximal Graft     79                triphasic           +------------------+--------+--------+---------+--------+  Mid Graft          112               triphasic           +------------------+--------+--------+---------+--------+  Distal Graft       100               triphasic           +------------------+--------+--------+---------+--------+  Distal Anastomosis 104               triphasic            +------------------+--------+--------+---------+--------+  Outflow            70                triphasic           +------------------+--------+--------+---------+--------+ 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  Left Graft #1: +--------------------+--------+--------+---------+--------+                       PSV cm/s Stenosis Waveform  Comments  +--------------------+--------+--------+---------+--------+  Inflow  66                triphasic           +--------------------+--------+--------+---------+--------+  Proximal Anastomosis 88                triphasic           +--------------------+--------+--------+---------+--------+  Proximal Graft       94                triphasic           +--------------------+--------+--------+---------+--------+  Mid Graft            130               triphasic           +--------------------+--------+--------+---------+--------+  Distal Graft         90                triphasic           +--------------------+--------+--------+---------+--------+  Distal Anastomosis   93                triphasic           +--------------------+--------+--------+---------+--------+  Outflow              38                triphasic           +--------------------+--------+--------+---------+--------+ 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  See table(s) above for measurements and observations. Electronically signed by Monica Martinez MD on 03/03/2021 at 3:28:29 PM.    Final      ASSESSMENT:  1.  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 Calton Dach -Dr. Joylene Grapes at [facility]-SPEP 0.2 g, immunofixation with biclonal IgG with lambda specificity. -Free kappa light chain 16.7, lambda light chains 122.7, ratio 0.14. -Kidney biopsy on 02/04/2020 with AL amyloidosis, lambda immunophenotype, Congo red stain  positive.  Immunofluorescence microscopy shows dense homogeneous glomerular mesangial staining with antisera specific for IgG 1-2+, IgM 2+, C1q 2+, lambda light chains 3+.  Waxy homogeneous staining noted along the blood vessels.  Congo red stain demonstrates positive apocrine birefringence within the glomerular mesangial regions and along blood vessel walls. -48 pound weight loss since summer 2021 with decreased appetite.  Positive for fatigue. -Reports occasional nausea, no vomiting.  Positive for occipital headaches. -Denies any tingling or numbness in the extremities.  He has cramps in the left arm which is new.  He had leg cramps for a long time. -Cardiac MRI on 02/29/2020 with EF 38%, no evidence of amyloid deposits in the myocardium. -PET scan on 02/26/2020 with scattered borderline mediastinal and hilar lymph nodes with low-level hypermetabolism most likely inflammatory or reactive.  No bone abnormalities.  No findings in the abdomen or pelvis. -Bone marrow biopsy on 02/26/2020 with slightly hypercellular bone marrow with 23% cells consistent with plasma cells, staining for lambda light chain.  No lymphoproliferative disorder.  Congo red stain was negative. -Bone marrow cytogenetics were normal.  FISH panel for multiple myeloma was negative. -M spike was 0.1 g, immunofixation shows IgG lambda.  LDH was elevated at 216.  Beta-2 microglobulin 4.5. - 6 cycles of Dara CyBorD from 03/20/2020 through 09/04/2020.  Currently on maintenance daratumumab.   2.  Social/family history: -He works  for [employer] [place] and retired 5 years ago. Never smoker. -Maternal aunt had cancer in male organs.  One maternal first cousin had appendiceal cancer, another maternal first cousin had cancer of the digestive system.   PLAN:  1.  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  M spike is stable at 0.1 g.  Lambda light  chains are stable at 37.  Ratio is 0.41.  Immunofixation shows IgG kappa. - Reviewed labs today which showed normal LFTs.  Creatinine is 2.20 with normal calcium.  White count is mildly elevated with hemoglobin 12.9 and normal differential. - 24-hour urine sample was submitted to the lab today. - Proceed with Darzalex maintenance today.  RTC 4 weeks for follow-up with repeat labs and treatment.     2.  Leg pains: - Continue tramadol 2 tablets and hydrocodone 2 tablets daily.  He had lower extremity Doppler which showed patent above-knee to below-knee bypass grafts with no evidence of restenosis.   3.  Leg weakness/pains: - Sural nerve biopsy of the left side shows demyelination but not amyloidosis. - No further falls reported.   4.  Elevated creatinine: - Creatinine is 2.20 and stable.   5.  Loss of appetite: - His appetite is good.  Is not requiring any appetite stimulants.   6.  CHF/lower extremity swelling: - Continue Entresto.  He is on Eliquis 5 mg twice daily.  His creatinine is 2.22.  Consider dose reduction to 2.5 mg twice daily.  He has bruising but no active bleeding.  Torsemide is on hold.  He has very trace edema today.  7.  Acid reflux: - Continue Protonix along with Zantac/Tums as needed.   Orders placed this encounter:  No orders of the defined types were placed in this encounter.    Derek Jack, MD [place] [phone]   I, Thana Ates, am acting as a scribe for Dr. Derek Jack.  I, Derek Jack MD, have reviewed the above documentation for accuracy and completeness, and I agree with the above.

## 2021-04-02 ENCOUNTER — Inpatient Hospital Stay (HOSPITAL_BASED_OUTPATIENT_CLINIC_OR_DEPARTMENT_OTHER): Payer: Medicare Other | Admitting: Hematology

## 2021-04-02 ENCOUNTER — Inpatient Hospital Stay (HOSPITAL_COMMUNITY): Payer: Medicare Other

## 2021-04-02 ENCOUNTER — Other Ambulatory Visit: Payer: Self-pay

## 2021-04-02 VITALS — BP 139/95 | HR 85 | Temp 97.4°F | Resp 18 | Ht 68.5 in | Wt 185.2 lb

## 2021-04-02 DIAGNOSIS — E8581 Light chain (AL) amyloidosis: Secondary | ICD-10-CM

## 2021-04-02 DIAGNOSIS — C9000 Multiple myeloma not having achieved remission: Secondary | ICD-10-CM

## 2021-04-02 DIAGNOSIS — Z5112 Encounter for antineoplastic immunotherapy: Secondary | ICD-10-CM | POA: Diagnosis not present

## 2021-04-02 LAB — CBC WITH DIFFERENTIAL/PLATELET
Abs Immature Granulocytes: 0.07 10*3/uL (ref 0.00–0.07)
Basophils Absolute: 0 10*3/uL (ref 0.0–0.1)
Basophils Relative: 0 %
Eosinophils Absolute: 0.9 10*3/uL — ABNORMAL HIGH (ref 0.0–0.5)
Eosinophils Relative: 7 %
HCT: 39.9 % (ref 39.0–52.0)
Hemoglobin: 12.9 g/dL — ABNORMAL LOW (ref 13.0–17.0)
Immature Granulocytes: 1 %
Lymphocytes Relative: 14 %
Lymphs Abs: 1.9 10*3/uL (ref 0.7–4.0)
MCH: 32.5 pg (ref 26.0–34.0)
MCHC: 32.3 g/dL (ref 30.0–36.0)
MCV: 100.5 fL — ABNORMAL HIGH (ref 80.0–100.0)
Monocytes Absolute: 0.6 10*3/uL (ref 0.1–1.0)
Monocytes Relative: 5 %
Neutro Abs: 9.7 10*3/uL — ABNORMAL HIGH (ref 1.7–7.7)
Neutrophils Relative %: 73 %
Platelets: 295 10*3/uL (ref 150–400)
RBC: 3.97 MIL/uL — ABNORMAL LOW (ref 4.22–5.81)
RDW: 15.6 % — ABNORMAL HIGH (ref 11.5–15.5)
WBC: 13.2 10*3/uL — ABNORMAL HIGH (ref 4.0–10.5)
nRBC: 0 % (ref 0.0–0.2)

## 2021-04-02 LAB — COMPREHENSIVE METABOLIC PANEL
ALT: 11 U/L (ref 0–44)
AST: 13 U/L — ABNORMAL LOW (ref 15–41)
Albumin: 3.1 g/dL — ABNORMAL LOW (ref 3.5–5.0)
Alkaline Phosphatase: 110 U/L (ref 38–126)
Anion gap: 8 (ref 5–15)
BUN: 28 mg/dL — ABNORMAL HIGH (ref 8–23)
CO2: 27 mmol/L (ref 22–32)
Calcium: 8.9 mg/dL (ref 8.9–10.3)
Chloride: 102 mmol/L (ref 98–111)
Creatinine, Ser: 2.2 mg/dL — ABNORMAL HIGH (ref 0.61–1.24)
GFR, Estimated: 31 mL/min — ABNORMAL LOW (ref >60)
Glucose, Bld: 109 mg/dL — ABNORMAL HIGH (ref 70–99)
Potassium: 4.2 mmol/L (ref 3.5–5.1)
Sodium: 137 mmol/L (ref 135–145)
Total Bilirubin: 0.5 mg/dL (ref 0.3–1.2)
Total Protein: 6.2 g/dL — ABNORMAL LOW (ref 6.5–8.1)

## 2021-04-02 LAB — LACTATE DEHYDROGENASE: LDH: 209 U/L — ABNORMAL HIGH (ref 98–192)

## 2021-04-02 LAB — MAGNESIUM: Magnesium: 2 mg/dL (ref 1.7–2.4)

## 2021-04-02 MED ORDER — HEPARIN SOD (PORK) LOCK FLUSH 100 UNIT/ML IV SOLN
500.0000 [IU] | Freq: Once | INTRAVENOUS | Status: AC
  Administered 2021-04-02: 12:00:00 500 [IU] via INTRAVENOUS

## 2021-04-02 MED ORDER — DARATUMUMAB-HYALURONIDASE-FIHJ 1800-30000 MG-UT/15ML ~~LOC~~ SOLN
1800.0000 mg | Freq: Once | SUBCUTANEOUS | Status: AC
  Administered 2021-04-02: 12:00:00 1800 mg via SUBCUTANEOUS
  Filled 2021-04-02: qty 15

## 2021-04-02 MED ORDER — SODIUM CHLORIDE 0.9% FLUSH
10.0000 mL | INTRAVENOUS | Status: DC | PRN
  Administered 2021-04-02: 12:00:00 10 mL via INTRAVENOUS

## 2021-04-02 NOTE — Progress Notes (Signed)
Patient has been examined by Dr. Katragadda, and vital signs and labs have been reviewed. ANC, Creatinine, LFTs, hemoglobin, and platelets are within treatment parameters per M.D. - pt may proceed with treatment.    °

## 2021-04-02 NOTE — Patient Instructions (Signed)
[facility]  Discharge Instructions: Thank you for choosing [facility] to provide your oncology and hematology care.  If you have a lab appointment with the Cancer Center, please come in thru the Main Entrance and check in at the main information desk.  Wear comfortable clothing and clothing appropriate for easy access to any Portacath or PICC line.   We strive to give you quality time with your provider. You may need to reschedule your appointment if you arrive late (15 or more minutes).  Arriving late affects you and other patients whose appointments are after yours.  Also, if you miss three or more appointments without notifying the office, you may be dismissed from the clinic at the provider's discretion.      For prescription refill requests, have your pharmacy contact our office and allow 72 hours for refills to be completed.        To help prevent nausea and vomiting after your treatment, we encourage you to take your nausea medication as directed.  BELOW ARE SYMPTOMS THAT SHOULD BE REPORTED IMMEDIATELY: *FEVER GREATER THAN 100.4 F (38 C) OR HIGHER *CHILLS OR SWEATING *NAUSEA AND VOMITING THAT IS NOT CONTROLLED WITH YOUR NAUSEA MEDICATION *UNUSUAL SHORTNESS OF BREATH *UNUSUAL BRUISING OR BLEEDING *URINARY PROBLEMS (pain or burning when urinating, or frequent urination) *BOWEL PROBLEMS (unusual diarrhea, constipation, pain near the anus) TENDERNESS IN MOUTH AND THROAT WITH OR WITHOUT PRESENCE OF ULCERS (sore throat, sores in mouth, or a toothache) UNUSUAL RASH, SWELLING OR PAIN  UNUSUAL VAGINAL DISCHARGE OR ITCHING   Items with * indicate a potential emergency and should be followed up as soon as possible or go to the Emergency Department if any problems should occur.  Please show the CHEMOTHERAPY ALERT CARD or IMMUNOTHERAPY ALERT CARD at check-in to the Emergency Department and triage nurse.  Should you have questions after your visit or need to cancel  or reschedule your appointment, please contact [facility] [phone]  and follow the prompts.  Office hours are 8:00 a.m. to 4:30 p.m. Monday - Friday. Please note that voicemails left after 4:00 p.m. may not be returned until the following business day.  We are closed weekends and major holidays. You have access to a nurse at all times for urgent questions. Please call the main number to the clinic [phone] and follow the prompts.  For any non-urgent questions, you may also contact your provider using MyChart. We now offer e-Visits for anyone 18 and older to request care online for non-urgent symptoms. For details visit [email or web address].   Also download the MyChart app! Go to the app store, search "MyChart", open the app, select [facility], and log in with your MyChart username and password.  Due to Covid, a mask is required upon entering the hospital/clinic. If you do not have a mask, one will be given to you upon arrival. For doctor visits, patients may have 1 support person aged 18 or older with them. For treatment visits, patients cannot have anyone with them due to current Covid guidelines and our immunocompromised population.  

## 2021-04-02 NOTE — Patient Instructions (Addendum)
[place] at [facility] Discharge Instructions   You were seen and examined today by Dr. Delton Coombes. He reviewed your lab work with you, which is normal/stable. We will proceed with your treatment today. Talk to Dr. Alroy Dust about decreasing the dose of your Eliquis to 2.5 mg twice a day due to your renal function and easy bruising. Return as scheduled in 4 weeks.    Thank you for choosing [place] at [facility] to provide your oncology and hematology care.  To afford each patient quality time with our provider, please arrive at least 15 minutes before your scheduled appointment time.   If you have a lab appointment with the [facility] please come in thru the Main Entrance and check in at the main information desk.  You need to re-schedule your appointment should you arrive 10 or more minutes late.  We strive to give you quality time with our providers, and arriving late affects you and other patients whose appointments are after yours.  Also, if you no show three or more times for appointments you may be dismissed from the clinic at the providers discretion.     Again, thank you for choosing [facility].  Our hope is that these requests will decrease the amount of time that you wait before being seen by our physicians.       _____________________________________________________________  Should you have questions after your visit to [facility], please contact our office at [phone] and follow the prompts.  Our office hours are 8:00 a.m. and 4:30 p.m. Monday - Friday.  Please note that voicemails left after 4:00 p.m. may not be returned until the following business day.  We are closed weekends and major holidays.  You do have access to a nurse 24-7, just call the main number to the clinic [phone] and do not press any options, hold on the line and a nurse will answer the phone.    For prescription refill  requests, have your pharmacy contact our office and allow 72 hours.    Due to Covid, you will need to wear a mask upon entering the hospital. If you do not have a mask, a mask will be given to you at the Main Entrance upon arrival. For doctor visits, patients may have 1 support person age 7 or older with them. For treatment visits, patients can not have anyone with them due to social distancing guidelines and our immunocompromised population.

## 2021-04-02 NOTE — Progress Notes (Signed)
Patient presents today for chemotherapy injection.  Patient is in satisfactory condition with no complaints voiced.  Vital signs are stable.  Labs reviewed by Dr. Delton Coombes during his office visit.  Creatinine today is 2.20.  Pre-medications (Tylenol, Benadryl, and Decadron) were taken at home prior to office visit at 0800.  We will proceed with treatment per MD orders.   Patient tolerated injection with no complaints voiced.  Site clean and dry with no bruising or swelling noted.  No complaints of pain.  Discharged with vital signs stable and no signs or symptoms of distress noted.

## 2021-04-03 LAB — KAPPA/LAMBDA LIGHT CHAINS
Kappa free light chain: 16.6 mg/L (ref 3.3–19.4)
Kappa, lambda light chain ratio: 0.35 (ref 0.26–1.65)
Lambda free light chains: 47.2 mg/L — ABNORMAL HIGH (ref 5.7–26.3)

## 2021-04-07 LAB — UPEP/UIFE/LIGHT CHAINS/TP, 24-HR UR
% BETA, Urine: 10.1 %
ALPHA 1 URINE: 7.7 %
Albumin, U: 70.1 %
Alpha 2, Urine: 9.6 %
Free Kappa Lt Chains,Ur: 98.74 mg/L — ABNORMAL HIGH (ref 1.17–86.46)
Free Kappa/Lambda Ratio: 0.52 — ABNORMAL LOW (ref 1.83–14.26)
Free Lambda Lt Chains,Ur: 190.94 mg/L — ABNORMAL HIGH (ref 0.27–15.21)
GAMMA GLOBULIN URINE: 2.5 %
M-SPIKE %, Urine: 0.7 % — ABNORMAL HIGH
M-Spike, Mg/24 Hr: 97 mg/24 hr — ABNORMAL HIGH
Total Protein, Urine-Ur/day: 13922 mg/24 hr — ABNORMAL HIGH (ref 30–150)
Total Protein, Urine: 928.1 mg/dL
Total Volume: 1500

## 2021-04-07 LAB — PROTEIN ELECTROPHORESIS, SERUM
A/G Ratio: 1 (ref 0.7–1.7)
Albumin ELP: 2.7 g/dL — ABNORMAL LOW (ref 2.9–4.4)
Alpha-1-Globulin: 0.3 g/dL (ref 0.0–0.4)
Alpha-2-Globulin: 1.3 g/dL — ABNORMAL HIGH (ref 0.4–1.0)
Beta Globulin: 0.7 g/dL (ref 0.7–1.3)
Gamma Globulin: 0.4 g/dL (ref 0.4–1.8)
Globulin, Total: 2.7 g/dL (ref 2.2–3.9)
M-Spike, %: 0.1 g/dL — ABNORMAL HIGH
Total Protein ELP: 5.4 g/dL — ABNORMAL LOW (ref 6.0–8.5)

## 2021-04-08 LAB — IMMUNOFIXATION ELECTROPHORESIS
IgA: 21 mg/dL — ABNORMAL LOW (ref 61–437)
IgG (Immunoglobin G), Serum: 380 mg/dL — ABNORMAL LOW (ref 603–1613)
IgM (Immunoglobulin M), Srm: 50 mg/dL (ref 15–143)
Total Protein ELP: 5.7 g/dL — ABNORMAL LOW (ref 6.0–8.5)

## 2021-04-22 ENCOUNTER — Other Ambulatory Visit (HOSPITAL_COMMUNITY): Payer: Self-pay

## 2021-04-22 MED ORDER — ALPRAZOLAM 0.25 MG PO TABS: 0.2500 mg | ORAL_TABLET | Freq: Four times a day (QID) | ORAL | 0 refills | Status: DC

## 2021-04-22 MED ORDER — TRAMADOL HCL 50 MG PO TABS: ORAL_TABLET | ORAL | 0 refills | Status: DC

## 2021-04-22 MED ORDER — HYDROCODONE-ACETAMINOPHEN 5-325 MG PO TABS: 1.0000 | ORAL_TABLET | Freq: Three times a day (TID) | ORAL | 0 refills | Status: DC | PRN

## 2021-04-30 ENCOUNTER — Inpatient Hospital Stay (HOSPITAL_COMMUNITY): Payer: Medicare Other | Attending: Hematology

## 2021-04-30 ENCOUNTER — Inpatient Hospital Stay (HOSPITAL_COMMUNITY): Payer: Medicare Other

## 2021-04-30 ENCOUNTER — Inpatient Hospital Stay (HOSPITAL_BASED_OUTPATIENT_CLINIC_OR_DEPARTMENT_OTHER): Payer: Medicare Other | Admitting: Hematology

## 2021-04-30 ENCOUNTER — Other Ambulatory Visit: Payer: Self-pay

## 2021-04-30 DIAGNOSIS — Z7952 Long term (current) use of systemic steroids: Secondary | ICD-10-CM | POA: Diagnosis not present

## 2021-04-30 DIAGNOSIS — R630 Anorexia: Secondary | ICD-10-CM | POA: Insufficient documentation

## 2021-04-30 DIAGNOSIS — I509 Heart failure, unspecified: Secondary | ICD-10-CM | POA: Diagnosis not present

## 2021-04-30 DIAGNOSIS — E8581 Light chain (AL) amyloidosis: Secondary | ICD-10-CM | POA: Diagnosis not present

## 2021-04-30 DIAGNOSIS — Z7982 Long term (current) use of aspirin: Secondary | ICD-10-CM | POA: Diagnosis not present

## 2021-04-30 DIAGNOSIS — I739 Peripheral vascular disease, unspecified: Secondary | ICD-10-CM | POA: Insufficient documentation

## 2021-04-30 DIAGNOSIS — I252 Old myocardial infarction: Secondary | ICD-10-CM | POA: Insufficient documentation

## 2021-04-30 DIAGNOSIS — Z79899 Other long term (current) drug therapy: Secondary | ICD-10-CM | POA: Diagnosis not present

## 2021-04-30 DIAGNOSIS — K219 Gastro-esophageal reflux disease without esophagitis: Secondary | ICD-10-CM | POA: Insufficient documentation

## 2021-04-30 DIAGNOSIS — I2510 Atherosclerotic heart disease of native coronary artery without angina pectoris: Secondary | ICD-10-CM | POA: Diagnosis not present

## 2021-04-30 DIAGNOSIS — Z5112 Encounter for antineoplastic immunotherapy: Secondary | ICD-10-CM | POA: Insufficient documentation

## 2021-04-30 DIAGNOSIS — C9000 Multiple myeloma not having achieved remission: Secondary | ICD-10-CM | POA: Insufficient documentation

## 2021-04-30 DIAGNOSIS — I110 Hypertensive heart disease with heart failure: Secondary | ICD-10-CM | POA: Diagnosis not present

## 2021-04-30 DIAGNOSIS — J3489 Other specified disorders of nose and nasal sinuses: Secondary | ICD-10-CM | POA: Diagnosis not present

## 2021-04-30 DIAGNOSIS — Z8673 Personal history of transient ischemic attack (TIA), and cerebral infarction without residual deficits: Secondary | ICD-10-CM | POA: Insufficient documentation

## 2021-04-30 DIAGNOSIS — Z7901 Long term (current) use of anticoagulants: Secondary | ICD-10-CM | POA: Insufficient documentation

## 2021-04-30 DIAGNOSIS — I10 Essential (primary) hypertension: Secondary | ICD-10-CM | POA: Diagnosis not present

## 2021-04-30 DIAGNOSIS — R519 Headache, unspecified: Secondary | ICD-10-CM

## 2021-04-30 LAB — COMPREHENSIVE METABOLIC PANEL
ALT: 11 U/L (ref 0–44)
AST: 13 U/L — ABNORMAL LOW (ref 15–41)
Albumin: 3.3 g/dL — ABNORMAL LOW (ref 3.5–5.0)
Alkaline Phosphatase: 88 U/L (ref 38–126)
Anion gap: 12 (ref 5–15)
BUN: 27 mg/dL — ABNORMAL HIGH (ref 8–23)
CO2: 25 mmol/L (ref 22–32)
Calcium: 8.9 mg/dL (ref 8.9–10.3)
Chloride: 99 mmol/L (ref 98–111)
Creatinine, Ser: 2.45 mg/dL — ABNORMAL HIGH (ref 0.61–1.24)
GFR, Estimated: 27 mL/min — ABNORMAL LOW (ref >60)
Glucose, Bld: 100 mg/dL — ABNORMAL HIGH (ref 70–99)
Potassium: 4.1 mmol/L (ref 3.5–5.1)
Sodium: 136 mmol/L (ref 135–145)
Total Bilirubin: 0.4 mg/dL (ref 0.3–1.2)
Total Protein: 6.4 g/dL — ABNORMAL LOW (ref 6.5–8.1)

## 2021-04-30 LAB — CBC WITH DIFFERENTIAL/PLATELET
Abs Immature Granulocytes: 0.07 10*3/uL (ref 0.00–0.07)
Basophils Absolute: 0.1 10*3/uL (ref 0.0–0.1)
Basophils Relative: 1 %
Eosinophils Absolute: 0.1 10*3/uL (ref 0.0–0.5)
Eosinophils Relative: 1 %
HCT: 44.6 % (ref 39.0–52.0)
Hemoglobin: 13.9 g/dL (ref 13.0–17.0)
Immature Granulocytes: 1 %
Lymphocytes Relative: 15 %
Lymphs Abs: 1.9 10*3/uL (ref 0.7–4.0)
MCH: 31 pg (ref 26.0–34.0)
MCHC: 31.2 g/dL (ref 30.0–36.0)
MCV: 99.3 fL (ref 80.0–100.0)
Monocytes Absolute: 0.9 10*3/uL (ref 0.1–1.0)
Monocytes Relative: 7 %
Neutro Abs: 9.6 10*3/uL — ABNORMAL HIGH (ref 1.7–7.7)
Neutrophils Relative %: 75 %
Platelets: 318 10*3/uL (ref 150–400)
RBC: 4.49 MIL/uL (ref 4.22–5.81)
RDW: 16.1 % — ABNORMAL HIGH (ref 11.5–15.5)
WBC: 12.6 10*3/uL — ABNORMAL HIGH (ref 4.0–10.5)
nRBC: 0 % (ref 0.0–0.2)

## 2021-04-30 LAB — MAGNESIUM: Magnesium: 2.3 mg/dL (ref 1.7–2.4)

## 2021-04-30 LAB — LACTATE DEHYDROGENASE: LDH: 182 U/L (ref 98–192)

## 2021-04-30 MED ORDER — HEPARIN SOD (PORK) LOCK FLUSH 100 UNIT/ML IV SOLN
500.0000 [IU] | Freq: Once | INTRAVENOUS | Status: AC
  Administered 2021-04-30: 500 [IU] via INTRAVENOUS

## 2021-04-30 MED ORDER — DARATUMUMAB-HYALURONIDASE-FIHJ 1800-30000 MG-UT/15ML ~~LOC~~ SOLN
1800.0000 mg | Freq: Once | SUBCUTANEOUS | Status: AC
  Administered 2021-04-30: 1800 mg via SUBCUTANEOUS
  Filled 2021-04-30: qty 15

## 2021-04-30 MED ORDER — SODIUM CHLORIDE 0.9% FLUSH
10.0000 mL | Freq: Once | INTRAVENOUS | Status: AC
  Administered 2021-04-30: 10 mL via INTRAVENOUS

## 2021-04-30 NOTE — Progress Notes (Signed)
Patients port flushed without difficulty.  Good blood return noted with no bruising or swelling noted at site.  Stable during access and blood draw.  Band aid applied.  VSS with discharge and left in satisfactory condition with no s/s of distress noted.   ?

## 2021-04-30 NOTE — Progress Notes (Signed)
Patient has been examined by Dr. Katragadda, and vital signs and labs have been reviewed. ANC, Creatinine, LFTs, hemoglobin, and platelets are within treatment parameters per M.D. - pt may proceed with treatment.    °

## 2021-04-30 NOTE — Progress Notes (Signed)
Patient presents today for treatment. Patient okay for treatment per Dr. Delton Coombes. Patient reports taking tylenol, benadryl and dex at home. Patient tolerated Daratumumab injection with no complaints voiced. See MAR for details. Lab reviewed. Injection site clean and dry with no bruising or swelling noted at site. Band aid applied. Vss with discharge and left in satisfactory condition with nos/s of distress noted.

## 2021-04-30 NOTE — Patient Instructions (Signed)
[facility]  Discharge Instructions: Thank you for choosing [facility] to provide your oncology and hematology care.  If you have a lab appointment with the [facility], please come in thru the Main Entrance and check in at the main information desk.  Wear comfortable clothing and clothing appropriate for easy access to any Portacath or PICC line.   We strive to give you quality time with your provider. You may need to reschedule your appointment if you arrive late (15 or more minutes).  Arriving late affects you and other patients whose appointments are after yours.  Also, if you miss three or more appointments without notifying the office, you may be dismissed from the clinic at the providers discretion.      For prescription refill requests, have your pharmacy contact our office and allow 72 hours for refills to be completed.    Today you received the following chemotherapy and/or immunotherapy agents Daratumumab, return as scheduled.   To help prevent nausea and vomiting after your treatment, we encourage you to take your nausea medication as directed.  BELOW ARE SYMPTOMS THAT SHOULD BE REPORTED IMMEDIATELY: *FEVER GREATER THAN 100.4 F (38 C) OR HIGHER *CHILLS OR SWEATING *NAUSEA AND VOMITING THAT IS NOT CONTROLLED WITH YOUR NAUSEA MEDICATION *UNUSUAL SHORTNESS OF BREATH *UNUSUAL BRUISING OR BLEEDING *URINARY PROBLEMS (pain or burning when urinating, or frequent urination) *BOWEL PROBLEMS (unusual diarrhea, constipation, pain near the anus) TENDERNESS IN MOUTH AND THROAT WITH OR WITHOUT PRESENCE OF ULCERS (sore throat, sores in mouth, or a toothache) UNUSUAL RASH, SWELLING OR PAIN  UNUSUAL VAGINAL DISCHARGE OR ITCHING   Items with * indicate a potential emergency and should be followed up as soon as possible or go to the Emergency Department if any problems should occur.  Please show the CHEMOTHERAPY ALERT CARD or IMMUNOTHERAPY ALERT CARD at check-in to  the Emergency Department and triage nurse.  Should you have questions after your visit or need to cancel or reschedule your appointment, please contact [facility] [phone]  and follow the prompts.  Office hours are 8:00 a.m. to 4:30 p.m. Monday - Friday. Please note that voicemails left after 4:00 p.m. may not be returned until the following business day.  We are closed weekends and major holidays. You have access to a nurse at all times for urgent questions. Please call the main number to the clinic [phone] and follow the prompts.  For any non-urgent questions, you may also contact your provider using MyChart. We now offer e-Visits for anyone 68 and older to request care online for non-urgent symptoms. For details visit [email or web address].   Also download the MyChart app! Go to the app store, search "MyChart", open the app, select [facility], and log in with your MyChart username and password.  Due to Covid, a mask is required upon entering the hospital/clinic. If you do not have a mask, one will be given to you upon arrival. For doctor visits, patients may have 1 support person aged 53 or older with them. For treatment visits, patients cannot have anyone with them due to current Covid guidelines and our immunocompromised population.

## 2021-04-30 NOTE — Patient Instructions (Addendum)
[facility] at [facility] Discharge Instructions   You were seen and examined today by Dr. Delton Coombes.  He reviewed the results of your lab work, which is normal/stable.   We will proceed with your treatment today.  We will arrange an MRI of your head to try to determine the cause of your headaches.  Return as scheduled in 4 weeks.      Thank you for choosing [place] at [facility] to provide your oncology and hematology care.  To afford each patient quality time with our provider, please arrive at least 15 minutes before your scheduled appointment time.   If you have a lab appointment with the [facility] please come in thru the Main Entrance and check in at the main information desk.  You need to re-schedule your appointment should you arrive 10 or more minutes late.  We strive to give you quality time with our providers, and arriving late affects you and other patients whose appointments are after yours.  Also, if you no show three or more times for appointments you may be dismissed from the clinic at the providers discretion.     Again, thank you for choosing [facility].  Our hope is that these requests will decrease the amount of time that you wait before being seen by our physicians.       _____________________________________________________________  Should you have questions after your visit to [facility], please contact our office at [phone] and follow the prompts.  Our office hours are 8:00 a.m. and 4:30 p.m. Monday - Friday.  Please note that voicemails left after 4:00 p.m. may not be returned until the following business day.  We are closed weekends and major holidays.  You do have access to a nurse 24-7, just call the main number to the clinic [phone] and do not press any options, hold on the line and a nurse will answer the phone.    For prescription refill requests, have your pharmacy  contact our office and allow 72 hours.    Due to Covid, you will need to wear a mask upon entering the hospital. If you do not have a mask, a mask will be given to you at the Main Entrance upon arrival. For doctor visits, patients may have 1 support person age 19 or older with them. For treatment visits, patients can not have anyone with them due to social distancing guidelines and our immunocompromised population.

## 2021-04-30 NOTE — Progress Notes (Signed)
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  PRIOR THERAPY: none  NGS Results: not done  CURRENT THERAPY: DaraCyBorD & Aloxi weekly  BRIEF ONCOLOGIC HISTORY:  Oncology History  Multiple myeloma ([place])  03/06/2020 Initial Diagnosis   Multiple myeloma ([place])   03/06/2020 Cancer Staging   Staging form: Plasma Cell Myeloma and Plasma Cell Disorders, AJCC 8th Edition - Clinical stage from 03/06/2020: RISS Stage II (Beta-2-microglobulin (mg/L): 4.5, Albumin (g/dL): 2.5, ISS: Stage II, High-risk cytogenetics: Absent, LDH: Elevated) - Signed by Derek Jack, MD on 03/06/2020    03/20/2020 - 03/20/2020 Chemotherapy   The patient had dexamethasone (DECADRON) tablet 40 mg, 40 mg, Oral,  Once, 0 of 4 cycles bortezomib SQ (VELCADE) chemo injection (2.72m/mL concentration) 2.75 mg, 1.3 mg/m2 = 2.75 mg, Subcutaneous,  Once, 0 of 4 cycles   for chemotherapy treatment.     03/20/2020 -  Chemotherapy   Patient is on Treatment Plan : PRIMARY AMYLOIDOSIS DaraCyBorD (Daratumumab SQ + Cyclophosphamide PO + Bortezomib SQ + Dexamethasone PO/IV) q28d x 6 cycles / Daratumumab SQ q28d       CANCER STAGING:  Cancer Staging  [place] Staging form: Plasma Cell Myeloma and Plasma Cell Disorders, AJCC 8th Edition - Clinical stage from 03/06/2020: RISS Stage II (Beta-2-microglobulin (mg/L): 4.5, Albumin (g/dL): 2.5, ISS: Stage II, High-risk cytogenetics: Absent, LDH: Elevated) - Signed by KDerek Jack MD on 03/06/2020   INTERVAL HISTORY:  [name] a 71y.o. male, returns for routine follow-up and consideration for next cycle of chemotherapy. THagenwas last seen on 04/02/2021.  Due for cycle #16 of DARZALEX FASPRO today.   Overall, he tells me he has been feeling pretty well. He reports a  headache which is worse on the back of his head followed by sweating, nausea, vomiting, and dizziness at night over the past 2 days. He forgot he had Zofran tablets over the past 2 nights, but he has taken 1 this morning and feels better. He denies cough and sore throat, and he denies ever previously having a similar headache. He reports increased sleepiness and napping throughout the day over the past month. His weight is stable. He denies any falls over the past month. He reports rhinorrhea.   Overall, he feels ready for next cycle of chemo today.   REVIEW OF SYSTEMS:  Review of Systems  Constitutional:  Negative for appetite change, fatigue and unexpected weight change.  HENT:   Negative for sore throat.   Respiratory:  Negative for cough.   Gastrointestinal:  Positive for nausea and vomiting.  Neurological:  Positive for dizziness and headaches.  Psychiatric/Behavioral:  Positive for sleep disturbance.   All other systems reviewed and are negative.  PAST MEDICAL/SURGICAL HISTORY:  Past Medical History:  Diagnosis Date   CAD (coronary artery disease)    GERD (gastroesophageal reflux disease)    Hypertension    Myocardial infarction ([facility] 1994   Peripheral vascular disease ([place]    [place]    Past Surgical History:  Procedure Laterality Date   BYPASS GRAFT POPLITEAL TO POPLITEAL Right 03/23/2018   Procedure: BYPASS GRAFT RIGHT ABOVE KNEE POPLITEAL TO BELOW KNEE POPLITEAL ARTERY  USING RIGHT GREAT SAPHENOUS VEIN;  Surgeon: CMarty Heck MD;  [place] [facility]  Service: Vascular;  Laterality: Right;   BYPASS GRAFT POPLITEAL  TO POPLITEAL Left 04/09/2019   Procedure: BYPASS  ABOVE KNEE POPLITEAL TO BELOW KNEE POPLITEAL AND LIGATION OF LEFT POPITEAL ANEURYSM;  Surgeon: Marty Heck, MD;  Location: [facility] OR;  Service: Vascular;  Laterality: Left;   COLONOSCOPY WITH ESOPHAGOGASTRODUODENOSCOPY (EGD)     CORONARY ARTERY BYPASS GRAFT     EYE SURGERY Bilateral    cataracts    FEMORAL BYPASS Right 03/23/2018   HERNIA REPAIR     IR IMAGING GUIDED PORT INSERTION  03/25/2020   MASTOIDECTOMY     ROTATOR CUFF REPAIR Right    TONSILLECTOMY     VEIN HARVEST Right 03/23/2018   Procedure: VEIN HARVEST RIGHT GREAT SAPHENOUS;  Surgeon: Marty Heck, MD;  Location: [facility] OR;  Service: Vascular;  Laterality: Right;   VEIN HARVEST Left 04/09/2019   Procedure: Harvest Greater Saphenous Vein and  Revise Elisa Lateral Saphenous Vein ;  Surgeon: Marty Heck, MD;  Location: [facility] OR;  Service: Vascular;  Laterality: Left;    SOCIAL HISTORY:  Social History   Socioeconomic History   Marital status: Married    Spouse name: Not on file   Number of children: Not on file   Years of education: Not on file   Highest education level: Not on file  Occupational History   Not on file  Tobacco Use   Smoking status: Never   Smokeless tobacco: Never  Vaping Use   Vaping Use: Never used  Substance and Sexual Activity   Alcohol use: Never   Drug use: Never   Sexual activity: Not on file  Other Topics Concern   Not on file  Social History Narrative   Not on file   Social Determinants of Health   Financial Resource Strain: Not on file  Food Insecurity: Not on file  Transportation Needs: Not on file  Physical Activity: Not on file  Stress: Not on file  Social Connections: Not on file  Intimate Partner Violence: Not on file    FAMILY HISTORY:  Family History  Problem Relation Age of Onset   Heart disease Mother     CURRENT MEDICATIONS:  Current Outpatient Medications  Medication Sig Dispense Refill   acetaminophen (TYLENOL) 500 MG tablet Take 500 mg by mouth 2 (two) times daily.     acyclovir (ZOVIRAX) 400 MG tablet Take 1 tablet (400 mg total) by mouth 2 (two) times daily. 60 tablet 5   ALPRAZolam (XANAX) 0.25 MG tablet Take 1 tablet (0.25 mg total) by mouth in the morning, at noon, in the evening, and at bedtime. 120 tablet 0   Apixaban (ELIQUIS PO) Take by  mouth.     aspirin 81 MG tablet Take 81 mg by mouth daily.     atorvastatin (LIPITOR) 80 MG tablet Take 80 mg by mouth at bedtime.      carvedilol (COREG) 25 MG tablet Take 25 mg by mouth daily.     chlorproMAZINE (THORAZINE) 25 MG tablet Take 1 tablet (25 mg total) by mouth 4 (four) times daily as needed. For hiccups 60 tablet 2   Dapagliflozin Propanediol (FARXIGA PO) Take by mouth.     dexamethasone (DECADRON) 4 MG tablet Take 94m (5 tabs) one hour prior to injection appointment every 28 days 5 tablet 11   diazepam (VALIUM) 2 MG tablet TAKE ONE TABLET BY MOUTH DAILY AS NEEDED FOR DIZZINESS 30 tablet 5   ergocalciferol (VITAMIN D2) 1.25 MG (50000 UT) capsule Take 1 capsule (50,000 Units total) by mouth once a week. 8  capsule 3   gabapentin (NEURONTIN) 300 MG capsule Take 1 capsule (300 mg total) by mouth daily. 30 capsule 6   HYDROcodone-acetaminophen (NORCO) 5-325 MG tablet Take 1 tablet by mouth every 8 (eight) hours as needed for moderate pain. 90 tablet 0   icosapent Ethyl (VASCEPA) 1 g capsule Take 1 g by mouth 2 (two) times daily.     Iron-Vitamin C (VITRON-C) 65-125 MG TABS Take 1 tablet by mouth daily. 60 tablet 3   lidocaine (XYLOCAINE) 2 % solution Use as directed 15 mLs in the mouth or throat as needed for mouth pain. 15 mL 3   lidocaine-prilocaine (EMLA) cream Apply 1 application topically as needed. Apply to portacath as needed 30 g 0   magnesium oxide (MAG-OX) 400 (241.3 Mg) MG tablet Take 1 tablet (400 mg total) by mouth daily. 30 tablet 0   magnesium oxide (MAG-OX) 400 MG tablet Take 1 tablet (400 mg total) by mouth daily. 30 tablet 3   meclizine (ANTIVERT) 25 MG tablet Take 1 tablet (25 mg total) by mouth 2 (two) times daily as needed for dizziness. 30 tablet 3   megestrol (MEGACE) 400 MG/10ML suspension Take 10 mLs (400 mg total) by mouth 2 (two) times daily. 480 mL 2   metoprolol succinate (TOPROL-XL) 25 MG 24 hr tablet Take 25 mg by mouth daily.     Multiple Minerals  (CALCIUM/MAGNESIUM/ZINC PO) Take 1 tablet by mouth at bedtime.     ondansetron (ZOFRAN) 4 MG tablet Take 4 mg by mouth every 8 (eight) hours as needed for nausea.     pantoprazole (PROTONIX) 40 MG tablet Take 1 tablet (40 mg total) by mouth daily. 30 tablet 6   potassium chloride (KLOR-CON) 10 MEQ tablet Take 1 tablet (10 mEq total) by mouth 2 (two) times daily. 90 tablet 3   prochlorperazine (COMPAZINE) 10 MG tablet Take 1 tablet (10 mg total) by mouth every 6 (six) hours as needed for nausea or vomiting. 60 tablet 3   sacubitril-valsartan (ENTRESTO) 24-26 MG Take 1 tablet by mouth 2 (two) times daily.     scopolamine (TRANSDERM-SCOP) 1 MG/3DAYS      sildenafil (REVATIO) 20 MG tablet Take 20 mg by mouth daily.     traMADol (ULTRAM) 50 MG tablet TAKE ONE TABLET BY MOUTH EVERY 8 HOURS AS NEEDED FOR SEVERE PAIN 60 tablet 0   vitamin B-12 (CYANOCOBALAMIN) 1000 MCG tablet Take 1,000 mcg by mouth daily.     zinc gluconate 50 MG tablet Take 50 mg by mouth daily.      No current facility-administered medications for this visit.   Facility-Administered Medications Ordered in Other Visits  Medication Dose Route Frequency Provider Last Rate Last Admin   0.9 %  sodium chloride infusion (Manually program via Guardrails IV Fluids)  250 mL Intravenous Once Pennington, Rebekah M, PA-C       acetaminophen (TYLENOL) tablet 650 mg  650 mg Oral Once Pennington, Rebekah M, PA-C       diphenhydrAMINE (BENADRYL) capsule 25 mg  25 mg Oral Once Pennington, Rebekah M, PA-C       heparin lock flush 100 unit/mL  500 Units Intracatheter Daily PRN Pennington, Rebekah M, PA-C       sodium chloride flush (NS) 0.9 % injection 10 mL  10 mL Intracatheter PRN Pennington, Rebekah M, PA-C        ALLERGIES:  Allergies  Allergen Reactions   Vancomycin     Other reaction(s): Red Man Syndrome Noted intraoperatively on  10/04/2018. No associated hemodynamic or respiratory changes. Please give slowly.    PHYSICAL EXAM:   Performance status (ECOG): 1 - Symptomatic but completely ambulatory  There were no vitals filed for this visit. Wt Readings from Last 3 Encounters:  04/30/21 185 lb (83.9 kg)  04/02/21 185 lb 3 oz (84 kg)  03/05/21 189 lb 8 oz (86 kg)   Physical Exam Vitals reviewed.  Constitutional:      Appearance: Normal appearance.  Cardiovascular:     Rate and Rhythm: Normal rate and regular rhythm.     Pulses: Normal pulses.     Heart sounds: Normal heart sounds.  Pulmonary:     Effort: Pulmonary effort is normal.     Breath sounds: Normal breath sounds.  Musculoskeletal:     Right lower leg: No edema.     Left lower leg: No edema.  Neurological:     General: No focal deficit present.     Mental Status: He is alert and oriented to person, place, and time.  Psychiatric:        Mood and Affect: Mood normal.        Behavior: Behavior normal.    LABORATORY DATA:  I have reviewed the labs as listed.  CBC Latest Ref Rng & Units 04/30/2021 04/02/2021 03/05/2021  WBC 4.0 - 10.5 K/uL 12.6(H) 13.2(H) 11.9(H)  Hemoglobin 13.0 - 17.0 g/dL 13.9 12.9(L) 12.6(L)  Hematocrit 39.0 - 52.0 % 44.6 39.9 37.9(L)  Platelets 150 - 400 K/uL 318 295 328   CMP Latest Ref Rng & Units 04/30/2021 04/02/2021 03/05/2021  Glucose 70 - 99 mg/dL 100(H) 109(H) 106(H)  BUN 8 - 23 mg/dL 27(H) 28(H) 28(H)  Creatinine 0.61 - 1.24 mg/dL 2.45(H) 2.20(H) 2.22(H)  Sodium 135 - 145 mmol/L 136 137 139  Potassium 3.5 - 5.1 mmol/L 4.1 4.2 3.9  Chloride 98 - 111 mmol/L 99 102 108  CO2 22 - 32 mmol/L 25 27 24   Calcium 8.9 - 10.3 mg/dL 8.9 8.9 8.9  Total Protein 6.5 - 8.1 g/dL 6.4(L) 6.2(L) 6.4(L)  Total Bilirubin 0.3 - 1.2 mg/dL 0.4 0.5 0.6  Alkaline Phos 38 - 126 U/L 88 110 99  AST 15 - 41 U/L 13(L) 13(L) 11(L)  ALT 0 - 44 U/L 11 11 10     DIAGNOSTIC IMAGING:  I have independently reviewed the scans and discussed with the patient. No results found.   ASSESSMENT:  1.  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 Calton Dach -Dr. Joylene Grapes at [facility]-SPE[facility] 0.2 g, immunofixation with biclonal IgG with lambda specificity. -Free kappa light chain 16.7, lambda light chains 122.7, ratio 0.14. -Kidney biopsy on 02/04/2020 with AL amyloidosis, lambda immunophenotype, [place] red stain positive.  Immunofluorescence microscopy shows dense homogeneous glomerular mesangial staining with antisera specific for IgG 1-2+, IgM 2+, C1q 2+, lambda light chains 3+.  Waxy homogeneous staining noted along the blood vessels.  Congo red stain demonstrates positive apocrine birefringence within the glomerular mesangial regions and along blood vessel walls. -48 pound weight loss since summer 2021 with decreased appetite.  Positive for fatigue. -Reports occasional nausea, no vomiting.  Positive for occipital headaches. -Denies any tingling or numbness in the extremities.  He has cramps in the left arm which is new.  He had leg cramps for a long time. -Cardiac MRI on 02/29/2020 with EF 38%, no evidence of amyloid deposits in the myocardium. -PET scan on 02/26/2020 with scattered borderline mediastinal and  hilar lymph nodes with low-level hypermetabolism most likely inflammatory or reactive.  No bone abnormalities.  No findings in the abdomen or pelvis. -Bone marrow biopsy on 02/26/2020 with slightly hypercellular bone marrow with 23% cells consistent with plasma cells, staining for lambda light chain.  No lymphoproliferative disorder.  Congo red stain was negative. -Bone marrow cytogenetics were normal.  FISH panel for multiple myeloma was negative. -M spike was 0.1 g, immunofixation shows IgG lambda.  LDH was elevated at 216.  Beta-2 microglobulin 4.5. - 6 cycles of Dara CyBorD from 03/20/2020 through 09/04/2020.  Currently on maintenance daratumumab.   2.  Social/family history: -He works for [employer] and retired 5 years ago. Never  smoker. -Maternal aunt had cancer in male organs.  One maternal first cousin had appendiceal cancer, another maternal first cousin had cancer of the digestive system.   PLAN:  1.  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  M spike is stable at 0.1 g.  Lambda light chains are 47, up from 37.  Ratio is 0.35 and stable.  Immunofixation positive for IgG lambda. - 24-hour urine on 04/02/2021 shows total protein 13.9 g, more or less stable. - Reviewed labs today which shows normal CBC and LFTs.  Albumin 3.3. - Proceed with Darzalex today. - RTC 4 weeks for follow-up with repeat labs. - He reported headache at nighttime for the last 2 nights.  Once the headache starts he also feels nauseous and sweats a lot.  He had thrown up twice each night around midnight and 2:00 in the morning.  No fevers or chills reported. - I have recommended MRI of the brain without contrast.  If the pain subsides in the next day or 2, he may cancel the MRI.     2.  Leg pains: - Continue tramadol 2 tablets and hydrocodone 2 tablets daily alternating. - Leg pains are well controlled.   3.  Leg weakness/pains: - Sural nerve biopsy of the left side shows demyelination but not amyloidosis. - No further falls reported.   4.  Elevated creatinine: - Creatinine has increased to 2.45 from 2.20, 4 weeks ago.  Likely due to [facility].  We will closely monitor.   5.  Loss of appetite: - Appetite is good.  Not requiring stimulants.   6.  CHF/lower extremity swelling: - Continue Entresto twice daily. - Continue Eliquis 2.5 mg twice daily.  Torsemide is on hold.  7.  Acid reflux: - Continue Protonix along with Zantac/Tums as needed.   Orders placed this encounter:  Orders Placed This Encounter  Procedures   MR Brain [place], MD [place] [phone]   I, Thana Ates, am acting  as a scribe for Dr. Derek Jack.  I, Derek Jack MD, have reviewed the above documentation for accuracy and completeness, and I agree with the above.

## 2021-05-01 ENCOUNTER — Ambulatory Visit (HOSPITAL_COMMUNITY)
Admission: RE | Admit: 2021-05-01 | Discharge: 2021-05-01 | Disposition: A | Discharge status: Home / Self Care | Payer: Medicare Other | Source: Ambulatory Visit | Attending: Hematology | Admitting: Hematology

## 2021-05-01 DIAGNOSIS — C9000 Multiple myeloma not having achieved remission: Secondary | ICD-10-CM | POA: Insufficient documentation

## 2021-05-01 DIAGNOSIS — R519 Headache, unspecified: Secondary | ICD-10-CM | POA: Diagnosis present

## 2021-05-01 DIAGNOSIS — E8581 Light chain (AL) amyloidosis: Secondary | ICD-10-CM | POA: Diagnosis present

## 2021-05-01 LAB — KAPPA/LAMBDA LIGHT CHAINS
Kappa free light chain: 14.8 mg/L (ref 3.3–19.4)
Kappa, lambda light chain ratio: 0.33 (ref 0.26–1.65)
Lambda free light chains: 45.3 mg/L — ABNORMAL HIGH (ref 5.7–26.3)

## 2021-05-04 LAB — IMMUNOFIXATION ELECTROPHORESIS
IgA: 21 mg/dL — ABNORMAL LOW (ref 61–437)
IgG (Immunoglobin G), Serum: 356 mg/dL — ABNORMAL LOW (ref 603–1613)
IgM (Immunoglobulin M), Srm: 53 mg/dL (ref 15–143)
Total Protein ELP: 5.8 g/dL — ABNORMAL LOW (ref 6.0–8.5)

## 2021-05-07 ENCOUNTER — Other Ambulatory Visit (HOSPITAL_COMMUNITY): Payer: Self-pay

## 2021-05-07 MED ORDER — OXYCODONE HCL 5 MG PO TABS
5.0000 mg | ORAL_TABLET | Freq: Three times a day (TID) | ORAL | 0 refills | Status: DC | PRN
Start: 2021-05-07 — End: 2021-06-03

## 2021-05-16 ENCOUNTER — Emergency Department (HOSPITAL_COMMUNITY): Payer: Medicare Other

## 2021-05-16 ENCOUNTER — Other Ambulatory Visit: Payer: Self-pay

## 2021-05-16 ENCOUNTER — Encounter (HOSPITAL_COMMUNITY): Payer: Self-pay

## 2021-05-16 ENCOUNTER — Emergency Department (HOSPITAL_COMMUNITY)
Admission: EM | Admit: 2021-05-16 | Discharge: 2021-05-16 | Disposition: A | Discharge status: Home / Self Care | Payer: Medicare Other | Attending: Emergency Medicine | Admitting: Emergency Medicine

## 2021-05-16 DIAGNOSIS — I10 Essential (primary) hypertension: Secondary | ICD-10-CM | POA: Insufficient documentation

## 2021-05-16 DIAGNOSIS — Z79899 Other long term (current) drug therapy: Secondary | ICD-10-CM | POA: Diagnosis not present

## 2021-05-16 DIAGNOSIS — Z7901 Long term (current) use of anticoagulants: Secondary | ICD-10-CM | POA: Diagnosis not present

## 2021-05-16 DIAGNOSIS — I2510 Atherosclerotic heart disease of native coronary artery without angina pectoris: Secondary | ICD-10-CM | POA: Diagnosis not present

## 2021-05-16 DIAGNOSIS — M79672 Pain in left foot: Secondary | ICD-10-CM | POA: Diagnosis not present

## 2021-05-16 DIAGNOSIS — Y9289 Other specified places as the place of occurrence of the external cause: Secondary | ICD-10-CM | POA: Diagnosis not present

## 2021-05-16 DIAGNOSIS — Y9301 Activity, walking, marching and hiking: Secondary | ICD-10-CM | POA: Insufficient documentation

## 2021-05-16 DIAGNOSIS — Z7982 Long term (current) use of aspirin: Secondary | ICD-10-CM | POA: Diagnosis not present

## 2021-05-16 DIAGNOSIS — M25561 Pain in right knee: Secondary | ICD-10-CM | POA: Diagnosis not present

## 2021-05-16 DIAGNOSIS — W010XXA Fall on same level from slipping, tripping and stumbling without subsequent striking against object, initial encounter: Secondary | ICD-10-CM | POA: Insufficient documentation

## 2021-05-16 DIAGNOSIS — W19XXXA Unspecified fall, initial encounter: Secondary | ICD-10-CM

## 2021-05-16 MED ORDER — OXYCODONE-ACETAMINOPHEN 5-325 MG PO TABS
2.0000 | ORAL_TABLET | Freq: Once | ORAL | Status: AC
  Administered 2021-05-16: 2 via ORAL
  Filled 2021-05-16: qty 2

## 2021-05-16 NOTE — Discharge Instructions (Addendum)
Follow-up with Dr.Cairns next week for recheck.  Use a walker to help with ambulation

## 2021-05-16 NOTE — ED Triage Notes (Signed)
Pov from home with cc of fall around lunch time. He turned wrong on some stairs and slipped. C/o left foot and right knee. Also has small skin tears on arm according to wife. Denies loc or head injury. Is on blood thinners.

## 2021-05-16 NOTE — ED Provider Notes (Signed)
[facility] EMERGENCY DEPARTMENT Provider Note   CSN: [id number] Arrival date & time: 05/16/21  1831     History  Chief Complaint  Patient presents with   Lytle Michaels    Julian Miranda is a 71 y.o. male.  Patient with a history of coronary artery disease and hypertension.  He tripped while walking on stairs.  Patient did not hit his head.  He complains of left foot pain and right knee pain  The history is provided by the patient and medical records. No language interpreter was used.  Fall This is a new problem. The current episode started 6 to 12 hours ago. The problem occurs rarely. The problem has been resolved. Pertinent negatives include no chest pain, no abdominal pain and no shortness of breath. Nothing aggravates the symptoms. Nothing relieves the symptoms. He has tried nothing for the symptoms. The treatment provided no relief.      Home Medications Prior to Admission medications   Medication Sig Start Date End Date Taking? Authorizing Provider  acetaminophen (TYLENOL) 500 MG tablet Take 500 mg by mouth 2 (two) times daily. 10/08/18   [provider]  acyclovir (ZOVIRAX) 400 MG tablet Take 1 tablet (400 mg total) by mouth 2 (two) times daily. 02/18/21   Derek Jack, MD  ALPRAZolam Duanne Moron) 0.25 MG tablet Take 1 tablet (0.25 mg total) by mouth in the morning, at noon, in the evening, and at bedtime. 04/22/21   Derek Jack, MD  Apixaban (ELIQUIS PO) Take by mouth.    [provider]  aspirin 81 MG tablet Take 81 mg by mouth daily.    [provider]  atorvastatin (LIPITOR) 80 MG tablet Take 80 mg by mouth at bedtime.  02/06/18   [provider]  carvedilol (COREG) 25 MG tablet Take 25 mg by mouth daily.    [provider]  chlorproMAZINE (THORAZINE) 25 MG tablet Take 1 tablet (25 mg total) by mouth 4 (four) times daily as needed. For hiccups 04/24/20   Derek Jack, MD  Dapagliflozin Propanediol (FARXIGA PO) Take by  mouth.    [provider]  dexamethasone (DECADRON) 4 MG tablet Take 20mg  (5 tabs) one hour prior to injection appointment every 28 days 03/31/21   Derek Jack, MD  diazepam (VALIUM) 2 MG tablet TAKE ONE TABLET BY MOUTH DAILY AS NEEDED FOR DIZZINESS 02/18/21   Derek Jack, MD  ergocalciferol (VITAMIN D2) 1.25 MG (50000 UT) capsule Take 1 capsule (50,000 Units total) by mouth once a week. 06/12/20   Derek Jack, MD  gabapentin (NEURONTIN) 300 MG capsule Take 1 capsule (300 mg total) by mouth daily. 08/07/20   Derek Jack, MD  HYDROcodone-acetaminophen (NORCO) 5-325 MG tablet Take 1 tablet by mouth every 8 (eight) hours as needed for moderate pain. 04/22/21   Derek Jack, MD  icosapent Ethyl (VASCEPA) 1 g capsule Take 1 g by mouth 2 (two) times daily.    [provider]  Iron-Vitamin C (VITRON-C) 65-125 MG TABS Take 1 tablet by mouth daily. 07/17/20   Derek Jack, MD  lidocaine (XYLOCAINE) 2 % solution Use as directed 15 mLs in the mouth or throat as needed for mouth pain. 05/09/20   Derek Jack, MD  lidocaine-prilocaine (EMLA) cream Apply 1 application topically as needed. Apply to portacath as needed 04/03/20   Derek Jack, MD  magnesium oxide (MAG-OX) 400 (241.3 Mg) MG tablet Take 1 tablet (400 mg total) by mouth daily. 06/19/20   Derek Jack, MD  magnesium oxide (MAG-OX) 400  MG tablet Take 1 tablet (400 mg total) by mouth daily. 02/18/21   Derek Jack, MD  meclizine (ANTIVERT) 25 MG tablet Take 1 tablet (25 mg total) by mouth 2 (two) times daily as needed for dizziness. 04/03/20   Derek Jack, MD  megestrol (MEGACE) 400 MG/10ML suspension Take 10 mLs (400 mg total) by mouth 2 (two) times daily. 05/01/20   Derek Jack, MD  metoprolol succinate (TOPROL-XL) 25 MG 24 hr tablet Take 25 mg by mouth daily. 11/30/20   [provider]  Multiple Minerals (CALCIUM/MAGNESIUM/ZINC PO) Take  1 tablet by mouth at bedtime.    [provider]  ondansetron (ZOFRAN) 4 MG tablet Take 4 mg by mouth every 8 (eight) hours as needed for nausea. 03/11/20   [provider]  oxyCODONE (OXY IR/ROXICODONE) 5 MG immediate release tablet Take 1 tablet (5 mg total) by mouth every 8 (eight) hours as needed for severe pain. 05/07/21   Derek Jack, MD  pantoprazole (PROTONIX) 40 MG tablet Take 1 tablet (40 mg total) by mouth daily. 12/11/20   Derek Jack, MD  potassium chloride (KLOR-CON) 10 MEQ tablet Take 1 tablet (10 mEq total) by mouth 2 (two) times daily. 07/15/20   Derek Jack, MD  prochlorperazine (COMPAZINE) 10 MG tablet Take 1 tablet (10 mg total) by mouth every 6 (six) hours as needed for nausea or vomiting. 09/04/20   Derek Jack, MD  sacubitril-valsartan (ENTRESTO) 24-26 MG Take 1 tablet by mouth 2 (two) times daily.    [provider]  scopolamine (TRANSDERM-SCOP) 1 MG/3DAYS     [provider]  sildenafil (REVATIO) 20 MG tablet Take 20 mg by mouth daily. 12/03/19   [provider]  traMADol (ULTRAM) 50 MG tablet TAKE ONE TABLET BY MOUTH EVERY 8 HOURS AS NEEDED FOR SEVERE PAIN 04/22/21   Derek Jack, MD  vitamin B-12 (CYANOCOBALAMIN) 1000 MCG tablet Take 1,000 mcg by mouth daily.    [provider]  zinc gluconate 50 MG tablet Take 50 mg by mouth daily.     [provider]      Allergies    Vancomycin    Review of Systems   Review of Systems  Constitutional:  Negative for chills and fever.  HENT:  Negative for ear pain and sore throat.   Eyes:  Negative for pain and visual disturbance.  Respiratory:  Negative for cough and shortness of breath.   Cardiovascular:  Negative for chest pain and palpitations.  Gastrointestinal:  Negative for abdominal pain and vomiting.  Genitourinary:  Negative for dysuria and hematuria.  Musculoskeletal:  Negative for arthralgias and back pain.       Pain in  left foot and right knee  Skin:  Negative for color change and rash.  Neurological:  Negative for seizures and syncope.  All other systems reviewed and are negative.  Physical Exam Updated Vital Signs BP 132/82    Pulse 100    Temp 97.9 F (36.6 C) (Oral)    Resp 19    Ht 5\' 10"  (1.778 m)    Wt 83.5 kg    SpO2 95%    BMI 26.40 kg/m  Physical Exam Vitals and nursing note reviewed.  Constitutional:      Appearance: He is well-developed.  HENT:     Head: Normocephalic.  Eyes:     General: No scleral icterus.    Conjunctiva/sclera: Conjunctivae normal.  Neck:     Thyroid: No thyromegaly.  Cardiovascular:     Rate  and Rhythm: Normal rate and regular rhythm.     Heart sounds: No murmur heard.   No friction rub. No gallop.  Pulmonary:     Breath sounds: No stridor. No wheezing or rales.  Chest:     Chest wall: No tenderness.  Abdominal:     General: There is no distension.     Tenderness: There is no abdominal tenderness. There is no rebound.  Musculoskeletal:     Cervical back: Neck supple.     Comments: Mild swelling to left foot and right knee  Lymphadenopathy:     Cervical: No cervical adenopathy.  Skin:    Findings: No erythema or rash.  Neurological:     Mental Status: He is alert and oriented to person, place, and time.     Motor: No abnormal muscle tone.     Coordination: Coordination normal.  Psychiatric:        Behavior: Behavior normal.    ED Results / Procedures / Treatments   Labs (all labs ordered are listed, but only abnormal results are displayed) Labs Reviewed - No data to display  EKG None  Radiology DG Knee Complete 4 Views Right  Result Date: 05/16/2021 CLINICAL DATA:  Fall down stairs, knee pain EXAM: RIGHT KNEE - COMPLETE 4+ VIEW COMPARISON:  None. FINDINGS: No fracture or dislocation is seen. Mild to moderate tricompartmental degenerative changes, most prominent in the medial compartment. Small suprapatellar knee joint effusion. Vascular  calcifications. Prominent calcification in the popliteal fossa, possibly reflecting a calcified popliteal fossa cyst. IMPRESSION: No fracture or dislocation is seen. Mild to moderate degenerative changes, as above. Electronically Signed   By: Julian Hy M.D.   On: 05/16/2021 19:50   DG Foot Complete Left  Result Date: 05/16/2021 CLINICAL DATA:  fall pain EXAM: LEFT FOOT - COMPLETE 3+ VIEW COMPARISON:  None. FINDINGS: There is no evidence of fracture or dislocation. Severe degenerative changes of the first metatarsophalangeal joint. Osteophyte formation along the superior navicular. No aggressive appearing focal bone abnormality. Soft tissues are unremarkable. Vascular calcifications. IMPRESSION: No acute displaced fracture or dislocation. Electronically Signed   By: Iven Finn M.D.   On: 05/16/2021 19:49    Procedures Procedures    Medications Ordered in ED Medications  oxyCODONE-acetaminophen (PERCOCET/ROXICET) 5-325 MG per tablet 2 tablet (has no administration in time range)    ED Course/ Medical Decision Making/ A&P                           Medical Decision Making Amount and/or Complexity of Data Reviewed Radiology: ordered.  Risk Prescription drug management.  Patient with contusion to left foot and right knee.  He is placed in a cam walker for the left foot and Ace bandage for his right knee and needs to use a walker to ambulate with.  He is to follow-up with orthopedics   This patient presents to the ED for concern of fall, this involves an extensive number of treatment options, and is a complaint that carries with it a high risk of complications and morbidity.  The differential diagnosis includes injury to left foot and right knee   Co morbidities that complicate the patient evaluation  Hypertension coronary disease   Additional history obtained:  Additional history obtained from patient External records from outside source obtained and reviewed including  hospital records   Lab Tests:  I Ordered, and personally interpreted labs.  The pertinent results include: No labs  Imaging Studies ordered:  I ordered imaging studies including right knee and left foot I independently visualized and interpreted imaging which showed no fractures or dislocation I agree with the radiologist interpretation   Cardiac Monitoring:  The patient was maintained on a cardiac monitor.  I personally viewed and interpreted the cardiac monitored which showed an underlying rhythm of: Normal sinus rhythm   Medicines ordered and prescription drug management:  I ordered medication including Percocet for pain Reevaluation of the patient after these medicines showed that the patient improved I have reviewed the patients home medicines and have made adjustments as needed   Test Considered:  MRI of the knee   Critical Interventions:  None   Consultations Obtained:  None  Problem List / ED Course:  Reevaluation  After the interventions noted above, I reevaluated the patient and found that they have :improved   Social Determinants of Health:  None   Dispostion:  After consideration of the diagnostic results and the patients response to treatment, I feel that the patent would benefit from discharge home with a cam walker to his left foot and Ace bandage for right knee and using a walker to ambulate with with follow-up with Ortho.          Final Clinical Impression(s) / ED Diagnoses Final diagnoses:  Fall, initial encounter    Rx / DC Orders ED Discharge Orders     None         Milton Ferguson, MD 05/18/21 1211

## 2021-05-22 ENCOUNTER — Encounter: Payer: Self-pay | Admitting: Orthopedic Surgery

## 2021-05-22 ENCOUNTER — Ambulatory Visit (INDEPENDENT_AMBULATORY_CARE_PROVIDER_SITE_OTHER): Payer: Medicare Other | Admitting: Orthopedic Surgery

## 2021-05-22 ENCOUNTER — Other Ambulatory Visit: Payer: Self-pay

## 2021-05-22 VITALS — BP 120/83 | HR 97 | Ht 70.0 in | Wt 184.0 lb

## 2021-05-22 DIAGNOSIS — M1711 Unilateral primary osteoarthritis, right knee: Secondary | ICD-10-CM

## 2021-05-22 DIAGNOSIS — S93402A Sprain of unspecified ligament of left ankle, initial encounter: Secondary | ICD-10-CM | POA: Diagnosis not present

## 2021-05-22 NOTE — Patient Instructions (Addendum)
Instructions Following Joint Injections  In clinic today, you received an injection in one of your joints (sometimes more than one).  Occasionally, you can have some pain at the injection site, this is normal.  You can place ice at the injection site, or take over-the-counter medications such as Tylenol (acetaminophen) or Advil (ibuprofen).  Please follow all directions listed on the bottle.  If your joint (knee or shoulder) becomes swollen, red or very painful, please contact the clinic for additional assistance.   Two medications were injected, including lidocaine and a steroid (often referred to as cortisone).  Lidocaine is effective almost immediately but wears off quickly.  However, the steroid can take a few days to improve your symptoms.  In some cases, it can make your pain worse for a couple of days.  Do not be concerned if this happens as it is common.  You can apply ice or take some over-the-counter medications as needed.    Instructions  1.  You have sustained an ankle sprain, or similar exercises that can be treated as an ankle sprain.  **These exercises can also be used as part of recovery from an ankle fracture.  2.  I encourage you to stay on your feet and gradually remove your walking boot.   3.  Below are some exercises that you can complete on your own to improve your symptoms.  4.  As an alternative, you can search for ankle sprain exercises online, and can see some demonstrations on YouTube  5.  If you are having difficulty with these exercises, we can also prescribe formal physical therapy  Ankle Exercises Ask your health care provider which exercises are safe for you. Do exercises exactly as told by your health care provider and adjust them as directed. It is normal to feel mild stretching, pulling, tightness, or mild discomfort as you do these exercises. Stop right away if you feel sudden pain or your pain gets worse. Do not begin these exercises until told by your health  care provider.  Stretching and range-of-motion exercises These exercises warm up your muscles and joints and improve the movement and flexibility of your ankle. These exercises may also help to relieve pain.  Dorsiflexion/plantar flexion  Sit with your L knee straight or bent. Do not rest your foot on anything. Flex your left ankle to tilt the top of your foot toward your shin. This is called dorsiflexion. Hold this position for 5 seconds. Point your toes downward to tilt the top of your foot away from your shin. This is called plantar flexion. Hold this position for 5 seconds. Repeat 10 times. Complete this exercise 2-3 times a day.  As tolerated  Ankle alphabet  Sit with your L foot supported at your lower leg. Do not rest your foot on anything. Make sure your foot has room to move freely. Think of your L foot as a paintbrush: Move your foot to trace each letter of the alphabet in the air. Keep your hip and knee still while you trace the letters. Trace every letter from A to Z. Make the letters as large as you can without causing or increasing any discomfort.  Repeat 2-3 times. Complete this exercise 2-3 times a day.   Strengthening exercises These exercises build strength and endurance in your ankle. Endurance is the ability to use your muscles for a long time, even after they get tired. Dorsiflexors These are muscles that lift your foot up. Secure a rubber exercise band or tube  to an object, such as a table leg, that will stay still when the band is pulled. Secure the other end around your L foot. Sit on the floor, facing the object with your L leg extended. The band or tube should be slightly tense when your foot is relaxed. Slowly flex your L ankle and toes to bring your foot toward your shin. Hold this position for 5 seconds. Slowly return your foot to the starting position, controlling the band as you do that. Repeat 10 times. Complete this exercise 2-3 times a  day.  Plantar flexors These are muscles that push your foot down. Sit on the floor with your L leg extended. Loop a rubber exercise band or tube around the ball of your L foot. The ball of your foot is on the walking surface, right under your toes. The band or tube should be slightly tense when your foot is relaxed. Slowly point your toes downward, pushing them away from you. Hold this position for 5 seconds. Slowly release the tension in the band or tube, controlling smoothly until your foot is back in the starting position. Repeat 10 times. Complete this exercise 2-3 times a day.  Towel curls  Sit in a chair on a non-carpeted surface, and put your feet on the floor. Place a towel in front of your feet. Keeping your heel on the floor, put your L foot on the towel. Pull the towel toward you by grabbing the towel with your toes and curling them under. Keep your heel on the floor. Let your toes relax. Grab the towel again. Keep pulling the towel until it is completely underneath your foot. Repeat 10 times. Complete this exercise 2-3 times a day.  Standing plantar flexion This is an exercise in which you use your toes to lift your body's weight while standing. Stand with your feet shoulder-width apart. Keep your weight spread evenly over the width of your feet while you rise up on your toes. Use a wall or table to steady yourself if needed, but try not to use it for support. If this exercise is too easy, try these options: Shift your weight toward your L leg until you feel challenged. If told by your health care provider, lift your uninjured leg off the floor. Hold this position for 5 seconds. Repeat 10 times. Complete this exercise 2-3 times a day.  Tandem walking Stand with one foot directly in front of the other. Slowly raise your back foot up, lifting your heel before your toes, and place it directly in front of your other foot. Continue to walk in this heel-to-toe way. Have a  countertop or wall nearby to use if needed to keep your balance, but try not to hold onto anything for support.  Repeat 10 times. Complete this exercise 2-3 times a day.

## 2021-05-22 NOTE — Progress Notes (Signed)
New Patient Visit  Assessment: Julian Miranda is a 71 y.o. male with the following: 1. Arthritis of right knee 2. Sprain of left ankle, unspecified ligament, initial encounter  Plan: Left ankle pain is improving.  I provided him with ankle sprain exercises.  Continue with activities as tolerated.  Regarding the right knee, he has some obvious bruising on physical exam.  I reviewed the radiographs with the patient, which demonstrates medial joint space narrowing, with associated osteophytes.  I think would benefit from an injection.  He elected to proceed.  Follow-up as needed.  Procedure note injection Right knee joint   Verbal consent was obtained to inject the right knee joint  Timeout was completed to confirm the site of injection.  The skin was prepped with alcohol and ethyl chloride was sprayed at the injection site.  A 21-gauge needle was used to inject 40 mg of Depo-Medrol and 1% lidocaine (3 cc) into the right knee using an anterolateral approach.  There were no complications. A sterile bandage was applied.    Follow-up: Return if symptoms worsen or fail to improve.  Subjective:  Chief Complaint  Patient presents with   New Patient (Initial Visit)   Ankle Pain    LT/ s/p fall  DOI 05/16/21   Knee Pain    RT knee DOI 05/16/21    History of Present Illness: Julian Miranda is a 71 y.o. male who presents for evaluation of multiple complaints.  He fell earlier this week, landing directly onto his right knee.  He also twisted his left ankle.  He was given a walking boot, but the pain in the ankle has improved.  He is now walking in a regular shoe.  He continues to have pain in the right knee.  He is taking medications as needed.   Review of Systems: No fevers or chills No numbness or tingling No chest pain No shortness of breath No bowel or bladder dysfunction No GI distress No headaches   Medical History:  Past Medical History:  Diagnosis Date   CAD (coronary  artery disease)    GERD (gastroesophageal reflux disease)    Hypertension    Myocardial infarction [facility]) 1994   Peripheral vascular disease ([place])    Stroke ([place])     Past Surgical History:  Procedure Laterality Date   BYPASS GRAFT POPLITEAL TO POPLITEAL Right 03/23/2018   Procedure: BYPASS GRAFT RIGHT ABOVE KNEE POPLITEAL TO BELOW KNEE POPLITEAL ARTERY  USING RIGHT GREAT SAPHENOUS VEIN;  Surgeon: Marty Heck, MD;  Location: [place];  Service: Vascular;  Laterality: Right;   BYPASS GRAFT POPLITEAL TO POPLITEAL Left 04/09/2019   Procedure: BYPASS  ABOVE KNEE POPLITEAL TO BELOW KNEE POPLITEAL AND LIGATION OF LEFT POPITEAL ANEURYSM;  Surgeon: Marty Heck, MD;  Location: [place];  Service: Vascular;  Laterality: Left;   COLONOSCOPY WITH ESOPHAGOGASTRODUODENOSCOPY (EGD)     CORONARY ARTERY BYPASS GRAFT     EYE SURGERY Bilateral    cataracts   FEMORAL BYPASS Right 03/23/2018   HERNIA REPAIR     IR IMAGING GUIDED PORT INSERTION  03/25/2020   MASTOIDECTOMY     ROTATOR CUFF REPAIR Right    TONSILLECTOMY     VEIN HARVEST Right 03/23/2018   Procedure: VEIN HARVEST RIGHT GREAT SAPHENOUS;  Surgeon: Marty Heck, MD;  Location: [place];  Service: Vascular;  Laterality: Right;   VEIN HARVEST Left 04/09/2019   Procedure: Harvest Greater Saphenous Vein and  Revise Elisa Lateral Saphenous Vein ;  Surgeon: Marty Heck, MD;  Location: [facility] OR;  Service: Vascular;  Laterality: Left;    Family History  Problem Relation Age of Onset   Heart disease Mother    Social History   Tobacco Use   Smoking status: Never   Smokeless tobacco: Never  Vaping Use   Vaping Use: Never used  Substance Use Topics   Alcohol use: Never   Drug use: Never    Allergies  Allergen Reactions   Vancomycin     Other reaction(s): Red Man Syndrome Noted intraoperatively on 10/04/2018. No associated hemodynamic or respiratory changes. Please give slowly.    Current Meds  Medication Sig    acetaminophen (TYLENOL) 500 MG tablet Take 500 mg by mouth 2 (two) times daily.   acyclovir (ZOVIRAX) 400 MG tablet Take 1 tablet (400 mg total) by mouth 2 (two) times daily.   ALPRAZolam (XANAX) 0.25 MG tablet Take 1 tablet (0.25 mg total) by mouth in the morning, at noon, in the evening, and at bedtime.   Apixaban (ELIQUIS PO) Take by mouth.   aspirin 81 MG tablet Take 81 mg by mouth daily.   atorvastatin (LIPITOR) 80 MG tablet Take 80 mg by mouth at bedtime.    carvedilol (COREG) 25 MG tablet Take 25 mg by mouth daily.   chlorproMAZINE (THORAZINE) 25 MG tablet Take 1 tablet (25 mg total) by mouth 4 (four) times daily as needed. For hiccups   Dapagliflozin Propanediol (FARXIGA PO) Take by mouth.   dexamethasone (DECADRON) 4 MG tablet Take 20mg  (5 tabs) one hour prior to injection appointment every 28 days   diazepam (VALIUM) 2 MG tablet TAKE ONE TABLET BY MOUTH DAILY AS NEEDED FOR DIZZINESS   ergocalciferol (VITAMIN D2) 1.25 MG (50000 UT) capsule Take 1 capsule (50,000 Units total) by mouth once a week.   gabapentin (NEURONTIN) 300 MG capsule Take 1 capsule (300 mg total) by mouth daily.   HYDROcodone-acetaminophen (NORCO) 5-325 MG tablet Take 1 tablet by mouth every 8 (eight) hours as needed for moderate pain.   icosapent Ethyl (VASCEPA) 1 g capsule Take 1 g by mouth 2 (two) times daily.   Iron-Vitamin C (VITRON-C) 65-125 MG TABS Take 1 tablet by mouth daily.   lidocaine (XYLOCAINE) 2 % solution Use as directed 15 mLs in the mouth or throat as needed for mouth pain.   lidocaine-prilocaine (EMLA) cream Apply 1 application topically as needed. Apply to portacath as needed   magnesium oxide (MAG-OX) 400 (241.3 Mg) MG tablet Take 1 tablet (400 mg total) by mouth daily.   magnesium oxide (MAG-OX) 400 MG tablet Take 1 tablet (400 mg total) by mouth daily.   meclizine (ANTIVERT) 25 MG tablet Take 1 tablet (25 mg total) by mouth 2 (two) times daily as needed for dizziness.   megestrol (MEGACE) 400  MG/10ML suspension Take 10 mLs (400 mg total) by mouth 2 (two) times daily.   metoprolol succinate (TOPROL-XL) 25 MG 24 hr tablet Take 25 mg by mouth daily.   Multiple Minerals (CALCIUM/MAGNESIUM/ZINC PO) Take 1 tablet by mouth at bedtime.   ondansetron (ZOFRAN) 4 MG tablet Take 4 mg by mouth every 8 (eight) hours as needed for nausea.   oxyCODONE (OXY IR/ROXICODONE) 5 MG immediate release tablet Take 1 tablet (5 mg total) by mouth every 8 (eight) hours as needed for severe pain.   pantoprazole (PROTONIX) 40 MG tablet Take 1 tablet (40 mg total) by mouth daily.   potassium chloride (KLOR-CON) 10 MEQ tablet Take 1  tablet (10 mEq total) by mouth 2 (two) times daily.   prochlorperazine (COMPAZINE) 10 MG tablet Take 1 tablet (10 mg total) by mouth every 6 (six) hours as needed for nausea or vomiting.   sacubitril-valsartan (ENTRESTO) 24-26 MG Take 1 tablet by mouth 2 (two) times daily.   scopolamine (TRANSDERM-SCOP) 1 MG/3DAYS    sildenafil (REVATIO) 20 MG tablet Take 20 mg by mouth daily.   traMADol (ULTRAM) 50 MG tablet TAKE ONE TABLET BY MOUTH EVERY 8 HOURS AS NEEDED FOR SEVERE PAIN   vitamin B-12 (CYANOCOBALAMIN) 1000 MCG tablet Take 1,000 mcg by mouth daily.   zinc gluconate 50 MG tablet Take 50 mg by mouth daily.     Objective: BP 120/83    Pulse 97    Ht 5\' 10"  (1.778 m)    Wt 184 lb (83.5 kg)    BMI 26.40 kg/m   Physical Exam:  General: Alert and oriented. and No acute distress. Gait: Right sided antalgic gait.  Evaluation left ankle demonstrates no swelling.  No bruising is appreciated.  Mild tenderness to palpation over the anterior lateral ankle.  Mild pain with resisted inversion.  Toes warm and well-perfused.  Evaluation of the right knee demonstrates no effusion.  He has some ecchymosis over the medial aspect of the knee.  This is tender to palpation.  Range of motion from 5-110 degrees.  No increased laxity to varus or valgus stress.  Negative Lachman  IMAGING: I personally  reviewed images previously obtained from the ED  Left ankle x-rays without acute injury.  Right knee x-rays with moderate degenerative changes including joint space narrowing in the medial compartment, as well as osteophytes.   New Medications:  No orders of the defined types were placed in this encounter.     Mordecai Rasmussen, MD  05/22/2021 12:56 PM

## 2021-05-25 ENCOUNTER — Other Ambulatory Visit (HOSPITAL_COMMUNITY): Payer: Self-pay

## 2021-05-25 MED ORDER — TRAMADOL HCL 50 MG PO TABS: ORAL_TABLET | ORAL | 0 refills | Status: DC

## 2021-05-25 MED ORDER — DIAZEPAM 2 MG PO TABS: ORAL_TABLET | ORAL | 5 refills | Status: AC

## 2021-05-25 MED ORDER — ALPRAZOLAM 0.25 MG PO TABS: 0.2500 mg | ORAL_TABLET | Freq: Four times a day (QID) | ORAL | 5 refills | Status: DC

## 2021-05-25 MED ORDER — HYDROCODONE-ACETAMINOPHEN 5-325 MG PO TABS: 1.0000 | ORAL_TABLET | Freq: Three times a day (TID) | ORAL | 0 refills | Status: DC | PRN

## 2021-05-28 ENCOUNTER — Inpatient Hospital Stay (HOSPITAL_BASED_OUTPATIENT_CLINIC_OR_DEPARTMENT_OTHER): Payer: Medicare Other | Admitting: Hematology

## 2021-05-28 ENCOUNTER — Other Ambulatory Visit: Payer: Self-pay

## 2021-05-28 ENCOUNTER — Inpatient Hospital Stay (HOSPITAL_COMMUNITY): Payer: Medicare Other

## 2021-05-28 ENCOUNTER — Inpatient Hospital Stay (HOSPITAL_COMMUNITY): Payer: Medicare Other | Attending: Hematology

## 2021-05-28 DIAGNOSIS — C9000 Multiple myeloma not having achieved remission: Secondary | ICD-10-CM | POA: Diagnosis not present

## 2021-05-28 DIAGNOSIS — R944 Abnormal results of kidney function studies: Secondary | ICD-10-CM | POA: Diagnosis not present

## 2021-05-28 DIAGNOSIS — R519 Headache, unspecified: Secondary | ICD-10-CM | POA: Insufficient documentation

## 2021-05-28 DIAGNOSIS — Z5112 Encounter for antineoplastic immunotherapy: Secondary | ICD-10-CM | POA: Insufficient documentation

## 2021-05-28 DIAGNOSIS — K219 Gastro-esophageal reflux disease without esophagitis: Secondary | ICD-10-CM | POA: Diagnosis not present

## 2021-05-28 DIAGNOSIS — E8581 Light chain (AL) amyloidosis: Secondary | ICD-10-CM

## 2021-05-28 DIAGNOSIS — Z7901 Long term (current) use of anticoagulants: Secondary | ICD-10-CM | POA: Insufficient documentation

## 2021-05-28 DIAGNOSIS — I252 Old myocardial infarction: Secondary | ICD-10-CM | POA: Diagnosis not present

## 2021-05-28 DIAGNOSIS — R531 Weakness: Secondary | ICD-10-CM | POA: Diagnosis not present

## 2021-05-28 DIAGNOSIS — R112 Nausea with vomiting, unspecified: Secondary | ICD-10-CM | POA: Insufficient documentation

## 2021-05-28 DIAGNOSIS — R634 Abnormal weight loss: Secondary | ICD-10-CM | POA: Diagnosis not present

## 2021-05-28 DIAGNOSIS — R7402 Elevation of levels of lactic acid dehydrogenase (LDH): Secondary | ICD-10-CM | POA: Diagnosis not present

## 2021-05-28 DIAGNOSIS — I509 Heart failure, unspecified: Secondary | ICD-10-CM | POA: Insufficient documentation

## 2021-05-28 DIAGNOSIS — R630 Anorexia: Secondary | ICD-10-CM | POA: Insufficient documentation

## 2021-05-28 DIAGNOSIS — I2510 Atherosclerotic heart disease of native coronary artery without angina pectoris: Secondary | ICD-10-CM | POA: Insufficient documentation

## 2021-05-28 DIAGNOSIS — Z79899 Other long term (current) drug therapy: Secondary | ICD-10-CM | POA: Diagnosis not present

## 2021-05-28 DIAGNOSIS — I739 Peripheral vascular disease, unspecified: Secondary | ICD-10-CM | POA: Diagnosis not present

## 2021-05-28 DIAGNOSIS — R252 Cramp and spasm: Secondary | ICD-10-CM | POA: Diagnosis not present

## 2021-05-28 DIAGNOSIS — I10 Essential (primary) hypertension: Secondary | ICD-10-CM | POA: Diagnosis not present

## 2021-05-28 DIAGNOSIS — Z7982 Long term (current) use of aspirin: Secondary | ICD-10-CM | POA: Insufficient documentation

## 2021-05-28 DIAGNOSIS — Z8673 Personal history of transient ischemic attack (TIA), and cerebral infarction without residual deficits: Secondary | ICD-10-CM | POA: Diagnosis not present

## 2021-05-28 DIAGNOSIS — R7989 Other specified abnormal findings of blood chemistry: Secondary | ICD-10-CM

## 2021-05-28 LAB — COMPREHENSIVE METABOLIC PANEL
ALT: 13 U/L (ref 0–44)
AST: 13 U/L — ABNORMAL LOW (ref 15–41)
Albumin: 2.9 g/dL — ABNORMAL LOW (ref 3.5–5.0)
Alkaline Phosphatase: 100 U/L (ref 38–126)
Anion gap: 9 (ref 5–15)
BUN: 46 mg/dL — ABNORMAL HIGH (ref 8–23)
CO2: 24 mmol/L (ref 22–32)
Calcium: 8.5 mg/dL — ABNORMAL LOW (ref 8.9–10.3)
Chloride: 104 mmol/L (ref 98–111)
Creatinine, Ser: 3.24 mg/dL — ABNORMAL HIGH (ref 0.61–1.24)
GFR, Estimated: 20 mL/min — ABNORMAL LOW (ref >60)
Glucose, Bld: 109 mg/dL — ABNORMAL HIGH (ref 70–99)
Potassium: 4.3 mmol/L (ref 3.5–5.1)
Sodium: 137 mmol/L (ref 135–145)
Total Bilirubin: 0.4 mg/dL (ref 0.3–1.2)
Total Protein: 5.8 g/dL — ABNORMAL LOW (ref 6.5–8.1)

## 2021-05-28 LAB — CBC WITH DIFFERENTIAL/PLATELET
Abs Immature Granulocytes: 0.14 10*3/uL — ABNORMAL HIGH (ref 0.00–0.07)
Basophils Absolute: 0.1 10*3/uL (ref 0.0–0.1)
Basophils Relative: 1 %
Eosinophils Absolute: 0.6 10*3/uL — ABNORMAL HIGH (ref 0.0–0.5)
Eosinophils Relative: 5 %
HCT: 40.1 % (ref 39.0–52.0)
Hemoglobin: 13.3 g/dL (ref 13.0–17.0)
Immature Granulocytes: 1 %
Lymphocytes Relative: 18 %
Lymphs Abs: 2.2 10*3/uL (ref 0.7–4.0)
MCH: 33.2 pg (ref 26.0–34.0)
MCHC: 33.2 g/dL (ref 30.0–36.0)
MCV: 100 fL (ref 80.0–100.0)
Monocytes Absolute: 0.8 10*3/uL (ref 0.1–1.0)
Monocytes Relative: 7 %
Neutro Abs: 8.5 10*3/uL — ABNORMAL HIGH (ref 1.7–7.7)
Neutrophils Relative %: 68 %
Platelets: 383 10*3/uL (ref 150–400)
RBC: 4.01 MIL/uL — ABNORMAL LOW (ref 4.22–5.81)
RDW: 15.5 % (ref 11.5–15.5)
WBC: 12.3 10*3/uL — ABNORMAL HIGH (ref 4.0–10.5)
nRBC: 0 % (ref 0.0–0.2)

## 2021-05-28 LAB — MAGNESIUM: Magnesium: 2.2 mg/dL (ref 1.7–2.4)

## 2021-05-28 LAB — LACTATE DEHYDROGENASE: LDH: 147 U/L (ref 98–192)

## 2021-05-28 MED ORDER — DARATUMUMAB-HYALURONIDASE-FIHJ 1800-30000 MG-UT/15ML ~~LOC~~ SOLN
1800.0000 mg | Freq: Once | SUBCUTANEOUS | Status: AC
  Administered 2021-05-28: 1800 mg via SUBCUTANEOUS
  Filled 2021-05-28: qty 15

## 2021-05-28 MED ORDER — SODIUM CHLORIDE 0.9 % IV SOLN: Freq: Once | INTRAVENOUS | Status: AC

## 2021-05-28 MED ORDER — HEPARIN SOD (PORK) LOCK FLUSH 100 UNIT/ML IV SOLN
500.0000 [IU] | Freq: Once | INTRAVENOUS | Status: AC
  Administered 2021-05-28: 500 [IU] via INTRAVENOUS

## 2021-05-28 MED ORDER — DEXAMETHASONE 4 MG PO TABS: 20.0000 mg | ORAL_TABLET | Freq: Once | ORAL | Status: DC

## 2021-05-28 MED ORDER — SODIUM CHLORIDE 0.9% FLUSH
10.0000 mL | INTRAVENOUS | Status: DC | PRN
  Administered 2021-05-28 (×2): 10 mL via INTRAVENOUS

## 2021-05-28 MED ORDER — DIPHENHYDRAMINE HCL 25 MG PO CAPS: 50.0000 mg | ORAL_CAPSULE | Freq: Once | ORAL | Status: DC

## 2021-05-28 MED ORDER — ACETAMINOPHEN 325 MG PO TABS: 650.0000 mg | ORAL_TABLET | Freq: Once | ORAL | Status: DC

## 2021-05-28 NOTE — Progress Notes (Signed)
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  PRIOR THERAPY: none  NGS Results: not done  CURRENT THERAPY: DaraCyBorD & Aloxi weekly  BRIEF ONCOLOGIC HISTORY:  Oncology History  Multiple myeloma (Julian Miranda)  03/06/2020 Initial Diagnosis   Multiple myeloma (Julian Miranda)   03/06/2020 Cancer Staging   Staging form: Plasma Cell Myeloma and Plasma Cell Disorders, AJCC 8th Edition - Clinical stage from 03/06/2020: RISS Stage II (Beta-2-microglobulin (mg/L): 4.5, Albumin (g/dL): 2.5, ISS: Stage II, High-risk cytogenetics: Absent, LDH: Elevated) - Signed by Derek Jack, MD on 03/06/2020    03/20/2020 - 03/20/2020 Chemotherapy   The patient had dexamethasone (DECADRON) tablet 40 mg, 40 mg, Oral,  Once, 0 of 4 cycles bortezomib SQ (VELCADE) chemo injection (2.40m/mL concentration) 2.75 mg, 1.3 mg/m2 = 2.75 mg, Subcutaneous,  Once, 0 of 4 cycles   for chemotherapy treatment.     03/20/2020 -  Chemotherapy   Patient is on Treatment Plan : PRIMARY AMYLOIDOSIS DaraCyBorD (Daratumumab SQ + Cyclophosphamide PO + Bortezomib SQ + Dexamethasone PO/IV) q28d x 6 cycles / Daratumumab SQ q28d       CANCER STAGING:  Cancer Staging  Multiple myeloma (Julian Miranda Staging form: Plasma Cell Myeloma and Plasma Cell Disorders, AJCC 8th Edition - Clinical stage from 03/06/2020: RISS Stage II (Beta-2-microglobulin (mg/L): 4.5, Albumin (g/dL): 2.5, ISS: Stage II, High-risk cytogenetics: Absent, LDH: Elevated) - Signed by KDerek Jack MD on 03/06/2020   INTERVAL HISTORY:  Julian Miranda a 71y.o. male, returns for routine follow-up and consideration for next cycle of chemotherapy. Julian Miranda last seen on 04/30/2021.  Due for cycle #17 of DaraCyBorD  today.   Overall, he tells me he has been feeling pretty well. He reports a fall on  05/16/2021 at which time he tripped down 2 steps; he denies light-headedness or dizziness at that time. His hearing has decreased. He reports numbness in his left fingers for 1-2 days following his last treatment which has resolved. His appetite is good, and he denies n/v/d. His leg pains have improved. He reports a sinus infection 2 weeks ago. He has lost 4 lbs since his last treatment.   Overall, he feels ready for next cycle of chemo today.   REVIEW OF SYSTEMS:  Review of Systems  Constitutional:  Negative for appetite change and fatigue. Unexpected weight change: -4 lbs. HENT:   Positive for hearing loss.   Gastrointestinal:  Negative for diarrhea, nausea and vomiting.  Musculoskeletal:  Positive for arthralgias (legs - improved).  Neurological:  Negative for dizziness, light-headedness and numbness (resolved).  Psychiatric/Behavioral:  Positive for sleep disturbance.   All other systems reviewed and are negative.  PAST MEDICAL/SURGICAL HISTORY:  Past Medical History:  Diagnosis Date   CAD (coronary artery disease)    GERD (gastroesophageal reflux disease)    Hypertension    Myocardial infarction ([facility] 1994   [place]ipheral vascular disease [place]    [place]    Past Surgical History:  Procedure Laterality Date   BYPASS GRAFT POPLITEAL TO POPLITEAL Right 03/23/2018   Procedure: BYPASS GRAFT RIGHT ABOVE KNEE POPLITEAL TO BELOW KNEE POPLITEAL ARTERY  USING RIGHT GREAT SAPHENOUS VEIN;  Surgeon: CMarty Heck MD;  [facility]  Service: Vascular;  Laterality: Right;   BYPASS GRAFT POPLITEAL TO POPLITEAL Left 04/09/2019   Procedure: BYPASS  ABOVE KNEE POPLITEAL  TO BELOW KNEE POPLITEAL AND LIGATION OF LEFT POPITEAL ANEURYSM;  Surgeon: Marty Heck, MD;  Location[facility];  Service: Vascular;  Laterality: Left;   COLONOSCOPY WITH ESOPHAGOGASTRODUODENOSCOPY (EGD)     CORONARY ARTERY BYPASS GRAFT     EYE SURGERY Bilateral    cataracts   FEMORAL BYPASS Right 03/23/2018    HERNIA REPAIR     IR IMAGING GUIDED PORT INSERTION  03/25/2020   MASTOIDECTOMY     ROTATOR CUFF REPAIR Right    TONSILLECTOMY     VEIN HARVEST Right 03/23/2018   Procedure: VEIN HARVEST RIGHT GREAT SAPHENOUS;  Surgeon: Marty Heck, MD;  [facility];  Service: Vascular;  Laterality: Right;   VEIN HARVEST Left 04/09/2019   Procedure: Harvest Greater Saphenous Vein and  Revise Elisa Lateral Saphenous Vein ;  Surgeon: Marty Heck, MD;  Location: [facility];  Service: Vascular;  Laterality: Left;    SOCIAL HISTORY:  Social History   Socioeconomic History   Marital status: Married    Spouse name: Not on file   Number of children: Not on file   Years of education: Not on file   Highest education level: Not on file  Occupational History   Not on file  Tobacco Use   Smoking status: Never   Smokeless tobacco: Never  Vaping Use   Vaping Use: Never used  Substance and Sexual Activity   Alcohol use: Never   Drug use: Never   Sexual activity: Not on file  Other Topics Concern   Not on file  Social History Narrative   Not on file   Social Determinants of Health   Financial Resource Strain: Not on file  Food Insecurity: Not on file  Transportation Needs: Not on file  Physical Activity: Not on file  Stress: Not on file  Social Connections: Not on file  Intimate Partner Violence: Not on file    FAMILY HISTORY:  Family History  Problem Relation Age of Onset   Heart disease Mother     CURRENT MEDICATIONS:  Current Outpatient Medications  Medication Sig Dispense Refill   acetaminophen (TYLENOL) 500 MG tablet Take 500 mg by mouth 2 (two) times daily.     acyclovir (ZOVIRAX) 400 MG tablet Take 1 tablet (400 mg total) by mouth 2 (two) times daily. 60 tablet 5   ALPRAZolam (XANAX) 0.25 MG tablet Take 1 tablet (0.25 mg total) by mouth in the morning, at noon, in the evening, and at bedtime. 120 tablet 5   Apixaban (ELIQUIS PO) Take by mouth.     aspirin 81 MG tablet  Take 81 mg by mouth daily.     atorvastatin (LIPITOR) 80 MG tablet Take 80 mg by mouth at bedtime.      carvedilol (COREG) 25 MG tablet Take 25 mg by mouth daily.     chlorproMAZINE (THORAZINE) 25 MG tablet Take 1 tablet (25 mg total) by mouth 4 (four) times daily as needed. For hiccups 60 tablet 2   Dapagliflozin Propanediol (FARXIGA PO) Take by mouth.     dexamethasone (DECADRON) 4 MG tablet Take 62m (5 tabs) one hour prior to injection appointment every 28 days 5 tablet 11   diazepam (VALIUM) 2 MG tablet TAKE ONE TABLET BY MOUTH DAILY AS NEEDED FOR DIZZINESS 30 tablet 5   ergocalciferol (VITAMIN D2) 1.25 MG (50000 UT) capsule Take 1 capsule (50,000 Units total) by mouth once a week. 8 capsule 3   gabapentin (NEURONTIN) 300 MG capsule Take 1 capsule (  300 mg total) by mouth daily. 30 capsule 6   HYDROcodone-acetaminophen (NORCO) 5-325 MG tablet Take 1 tablet by mouth every 8 (eight) hours as needed for moderate pain. 90 tablet 0   icosapent Ethyl (VASCEPA) 1 g capsule Take 1 g by mouth 2 (two) times daily.     Iron-Vitamin C (VITRON-C) 65-125 MG TABS Take 1 tablet by mouth daily. 60 tablet 3   lidocaine (XYLOCAINE) 2 % solution Use as directed 15 mLs in the mouth or throat as needed for mouth pain. 15 mL 3   lidocaine-prilocaine (EMLA) cream Apply 1 application topically as needed. Apply to portacath as needed 30 g 0   magnesium oxide (MAG-OX) 400 (241.3 Mg) MG tablet Take 1 tablet (400 mg total) by mouth daily. 30 tablet 0   magnesium oxide (MAG-OX) 400 MG tablet Take 1 tablet (400 mg total) by mouth daily. 30 tablet 3   meclizine (ANTIVERT) 25 MG tablet Take 1 tablet (25 mg total) by mouth 2 (two) times daily as needed for dizziness. 30 tablet 3   megestrol (MEGACE) 400 MG/10ML suspension Take 10 mLs (400 mg total) by mouth 2 (two) times daily. 480 mL 2   metoprolol succinate (TOPROL-XL) 25 MG 24 hr tablet Take 25 mg by mouth daily.     Multiple Minerals (CALCIUM/MAGNESIUM/ZINC PO) Take 1  tablet by mouth at bedtime.     ondansetron (ZOFRAN) 4 MG tablet Take 4 mg by mouth every 8 (eight) hours as needed for nausea.     oxyCODONE (OXY IR/ROXICODONE) 5 MG immediate release tablet Take 1 tablet (5 mg total) by mouth every 8 (eight) hours as needed for severe pain. 84 tablet 0   pantoprazole (PROTONIX) 40 MG tablet Take 1 tablet (40 mg total) by mouth daily. 30 tablet 6   potassium chloride (KLOR-CON) 10 MEQ tablet Take 1 tablet (10 mEq total) by mouth 2 (two) times daily. 90 tablet 3   prochlorperazine (COMPAZINE) 10 MG tablet Take 1 tablet (10 mg total) by mouth every 6 (six) hours as needed for nausea or vomiting. 60 tablet 3   sacubitril-valsartan (ENTRESTO) 24-26 MG Take 1 tablet by mouth 2 (two) times daily.     scopolamine (TRANSDERM-SCOP) 1 MG/3DAYS      sildenafil (REVATIO) 20 MG tablet Take 20 mg by mouth daily.     traMADol (ULTRAM) 50 MG tablet TAKE ONE TABLET BY MOUTH EVERY 8 HOURS AS NEEDED FOR SEVERE PAIN 60 tablet 0   vitamin B-12 (CYANOCOBALAMIN) 1000 MCG tablet Take 1,000 mcg by mouth daily.     zinc gluconate 50 MG tablet Take 50 mg by mouth daily.      No current facility-administered medications for this visit.   Facility-Administered Medications Ordered in Other Visits  Medication Dose Route Frequency Provider Last Rate Last Admin   0.9 %  sodium chloride infusion (Manually program via Guardrails IV Fluids)  250 mL Intravenous Once Pennington, Rebekah M, PA-C       acetaminophen (TYLENOL) tablet 650 mg  650 mg Oral Once Pennington, Rebekah M, PA-C       diphenhydrAMINE (BENADRYL) capsule 25 mg  25 mg Oral Once Pennington, Rebekah M, PA-C       heparin lock flush 100 unit/mL  500 Units Intracatheter Daily PRN Pennington, Rebekah M, PA-C       sodium chloride flush (NS) 0.9 % injection 10 mL  10 mL Intracatheter PRN Tarri Abernethy M, PA-C        ALLERGIES:  Allergies  Allergen Reactions   Vancomycin     Other reaction(s): Red Man Syndrome Noted  intraoperatively on 10/04/2018. No associated hemodynamic or respiratory changes. Please give slowly.    PHYSICAL EXAM:  Performance status (ECOG): 1 - Symptomatic but completely ambulatory  There were no vitals filed for this visit. Wt Readings from Last 3 Encounters:  05/22/21 184 lb (83.5 kg)  05/16/21 184 lb (83.5 kg)  04/30/21 185 lb (83.9 kg)   Physical Exam Vitals reviewed.  Constitutional:      Appearance: Normal appearance.  HENT:     Right Ear: Ear canal normal.  Cardiovascular:     Rate and Rhythm: Normal rate and regular rhythm.     Pulses: Normal pulses.     Heart sounds: Normal heart sounds.  Pulmonary:     Effort: Pulmonary effort is normal.     Breath sounds: Normal breath sounds.  Musculoskeletal:     Right lower leg: No edema.     Left lower leg: No edema.  Neurological:     General: No focal deficit present.     Mental Status: He is alert and oriented to person, place, and time.  Psychiatric:        Mood and Affect: Mood normal.        Behavior: Behavior normal.    LABORATORY DATA:  I have reviewed the labs as listed.  CBC Latest Ref Rng & Units 04/30/2021 04/02/2021 03/05/2021  WBC 4.0 - 10.5 K/uL 12.6(H) 13.2(H) 11.9(H)  Hemoglobin 13.0 - 17.0 g/dL 13.9 12.9(L) 12.6(L)  Hematocrit 39.0 - 52.0 % 44.6 39.9 37.9(L)  Platelets 150 - 400 K/uL 318 295 328   CMP Latest Ref Rng & Units 04/30/2021 04/02/2021 03/05/2021  Glucose 70 - 99 mg/dL 100(H) 109(H) 106(H)  BUN 8 - 23 mg/dL 27(H) 28(H) 28(H)  Creatinine 0.61 - 1.24 mg/dL 2.45(H) 2.20(H) 2.22(H)  Sodium 135 - 145 mmol/L 136 137 139  Potassium 3.5 - 5.1 mmol/L 4.1 4.2 3.9  Chloride 98 - 111 mmol/L 99 102 108  CO2 22 - 32 mmol/L 25 27 24   Calcium 8.9 - 10.3 mg/dL 8.9 8.9 8.9  Total Protein 6.5 - 8.1 g/dL 6.4(L) 6.2(L) 6.4(L)  Total Bilirubin 0.3 - 1.2 mg/dL 0.4 0.5 0.6  Alkaline Phos 38 - 126 U/L 88 110 99  AST 15 - 41 U/L 13(L) 13(L) 11(L)  ALT 0 - 44 U/L 11 11 10     DIAGNOSTIC IMAGING:  I have  independently reviewed the scans and discussed with the patient. MR Brain Wo Contrast  Result Date: 05/03/2021 CLINICAL DATA:  Headache, new or worsening. Nausea and vomiting. Cold sweats. Personal history of multiple myeloma and amyloid. EXAM: MRI HEAD WITHOUT CONTRAST TECHNIQUE: Multiplanar, multiecho pulse sequences of the brain and surrounding structures were obtained without intravenous contrast. COMPARISON:  None. FINDINGS: Brain: Minimal atrophy and white matter changes within normal limits for age. No acute infarct, hemorrhage, or mass lesion is present. The ventricles are of normal size. No significant extraaxial fluid collection is present. The internal auditory canals are within normal limits. The brainstem and cerebellum are within normal limits. Vascular: Flow is present in the major intracranial arteries. Skull and upper cervical spine: The craniocervical junction is normal. Upper cervical spine is within normal limits. Marrow signal is unremarkable. Sinuses/Orbits: The paranasal sinuses and mastoid air cells are clear. Left mastoid effusion is present. No obstructing nasopharyngeal lesion is present. The paranasal sinuses and mastoid air cells are otherwise clear. Bilateral lens replacements are noted.  Globes and orbits are otherwise unremarkable. IMPRESSION: 1. Normal MRI appearance of the brain for age. No acute or focal lesion to explain the patient's symptoms. 2. Left mastoid effusion. No obstructing nasopharyngeal lesion is present. Electronically Signed   By: San Morelle M.D.   On: 05/03/2021 08:01   DG Knee Complete 4 Views Right  Result Date: 05/16/2021 CLINICAL DATA:  Fall down stairs, knee pain EXAM: RIGHT KNEE - COMPLETE 4+ VIEW COMPARISON:  None. FINDINGS: No fracture or dislocation is seen. Mild to moderate tricompartmental degenerative changes, most prominent in the medial compartment. Small suprapatellar knee joint effusion. Vascular calcifications. Prominent  calcification in the popliteal fossa, possibly reflecting a calcified popliteal fossa cyst. IMPRESSION: No fracture or dislocation is seen. Mild to moderate degenerative changes, as above. Electronically Signed   By: Julian Hy M.D.   On: 05/16/2021 19:50   DG Foot Complete Left  Result Date: 05/16/2021 CLINICAL DATA:  fall pain EXAM: LEFT FOOT - COMPLETE 3+ VIEW COMPARISON:  None. FINDINGS: There is no evidence of fracture or dislocation. Severe degenerative changes of the first metatarsophalangeal joint. Osteophyte formation along the superior navicular. No aggressive appearing focal bone abnormality. Soft tissues are unremarkable. Vascular calcifications. IMPRESSION: No acute displaced fracture or dislocation. Electronically Signed   By: Iven Finn M.D.   On: 05/16/2021 19:49     ASSESSMENT:  1.  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 Calton Dach -Dr. Joylene Grapes at [facility]-SPEP 0.2 g, immunofixation with biclonal IgG with lambda specificity. -Free kappa light chain 16.7, lambda light chains 122.7, ratio 0.14. -Kidney biopsy on 02/04/2020 with AL amyloidosis, lambda immunophenotype, [place] red stain positive.  Immunofluorescence microscopy shows dense homogeneous glomerular mesangial staining with antisera specific for IgG 1-2+, IgM 2+, C1q 2+, lambda light chains 3+.  Waxy homogeneous staining noted along the blood vessels.  Congo red stain demonstrates positive apocrine birefringence within the glomerular mesangial regions and along blood vessel walls. -48 pound weight loss since summer 2021 with decreased appetite.  Positive for fatigue. -Reports occasional nausea, no vomiting.  Positive for occipital headaches. -Denies any tingling or numbness in the extremities.  He has cramps in the left arm which is new.  He had leg cramps for a long time. -Cardiac MRI on 02/29/2020 with EF 38%, no evidence  of amyloid deposits in the myocardium. -PET scan on 02/26/2020 with scattered borderline mediastinal and hilar lymph nodes with low-level hypermetabolism most likely inflammatory or reactive.  No bone abnormalities.  No findings in the abdomen or pelvis. -Bone marrow biopsy on 02/26/2020 with slightly hypercellular bone marrow with 23% cells consistent with plasma cells, staining for lambda light chain.  No lymphoproliferative disorder.  [place] red stain was negative. -Bone marrow cytogenetics were normal.  FISH panel for multiple myeloma was negative. -M spike was 0.1 g, immunofixation shows IgG lambda.  LDH was elevated at 216.  Beta-2 microglobulin 4.5. - 6 cycles of Dara CyBorD from 03/20/2020 through 09/04/2020.  Currently on maintenance daratumumab.   2.  Social/family history: -He works for [employer] and retired 5 years ago. Never smoker. -Maternal aunt had cancer in male organs.  One maternal first cousin had appendiceal cancer, another maternal first cousin had cancer of the digestive system.   PLAN:  1.  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  Lambda light chains are stable at  45 with ratio of 0.33. - Last 24-hour urine on 04/02/2021 shows total protein 13.9 g, more or less stable. - MRI of the brain from 05/03/2021 reviewed by me shows normal with no focal lesions.  Left mastoid effusion seen. - I have reviewed labs today which showed normal CBC.  CMP shows elevated creatinine 3.24 and BUN 46.  We will give him 1 final mL of normal saline today.  LDH was normal. - We will proceed with Darzalex today. - She reported decreased hearing in the right ear.  Examination did not show any wax.  Left ear was always consistent with decreased hearing.  If there is no improvement, I recommended audiologist/ENT evaluation. - RTC 4 weeks for follow-up.     2.  Leg pains: - Continue tramadol 2  tablets and hydrocodone 2 tablets daily alternating.  Leg pains are well controlled.   3.  Leg weakness/pains: - Sural nerve biopsy of the left side shows demyelination but not amyloidosis. - He recently fell on 05/16/2021 when he tripped while coming down the stairs. - Reviewed x-rays of his foot and knee from the ER visit which did not show any fractures. - He was evaluated by Dr. Cecilie Lowers in orthopedics.   4.  Elevated creatinine: - Creatinine has increased to 3.24 from 2.45 on 04/30/2021 and 2.20 on 04/02/2021. - Likely from [place].  We will send information to Dr. Virgilio Belling office.   5.  Loss of appetite: - He reports eating well.  However he lost 4 pounds since last month.  We will closely monitor.   6.  CHF/lower extremity swelling: - Continue Eliquis 2.5 mg twice daily.  Continue Entresto twice daily.  7.  Acid reflux: - Continue Protonix.  Continue Tums as needed.   Orders placed this encounter:  No orders of the defined types were placed in this encounter.    Derek Jack, MD [place] [phone]   I, Thana Ates, am acting as a scribe for Dr. Derek Jack.  I, Derek Jack MD, have reviewed the above documentation for accuracy and completeness, and I agree with the above.

## 2021-05-28 NOTE — Progress Notes (Signed)
Patient has been examined by Dr. Katragadda, and vital signs and labs have been reviewed. ANC, Creatinine, LFTs, hemoglobin, and platelets are within treatment parameters per M.D. - pt may proceed with treatment.    °

## 2021-05-28 NOTE — Patient Instructions (Signed)
[facility]  Discharge Instructions: Thank you for choosing [facility] to provide your oncology and hematology care.  If you have a lab appointment with the Cancer Center, please come in thru the Main Entrance and check in at the main information desk.  Wear comfortable clothing and clothing appropriate for easy access to any Portacath or PICC line.   We strive to give you quality time with your provider. You may need to reschedule your appointment if you arrive late (15 or more minutes).  Arriving late affects you and other patients whose appointments are after yours.  Also, if you miss three or more appointments without notifying the office, you may be dismissed from the clinic at the provider's discretion.      For prescription refill requests, have your pharmacy contact our office and allow 72 hours for refills to be completed.        To help prevent nausea and vomiting after your treatment, we encourage you to take your nausea medication as directed.  BELOW ARE SYMPTOMS THAT SHOULD BE REPORTED IMMEDIATELY: *FEVER GREATER THAN 100.4 F (38 C) OR HIGHER *CHILLS OR SWEATING *NAUSEA AND VOMITING THAT IS NOT CONTROLLED WITH YOUR NAUSEA MEDICATION *UNUSUAL SHORTNESS OF BREATH *UNUSUAL BRUISING OR BLEEDING *URINARY PROBLEMS (pain or burning when urinating, or frequent urination) *BOWEL PROBLEMS (unusual diarrhea, constipation, pain near the anus) TENDERNESS IN MOUTH AND THROAT WITH OR WITHOUT PRESENCE OF ULCERS (sore throat, sores in mouth, or a toothache) UNUSUAL RASH, SWELLING OR PAIN  UNUSUAL VAGINAL DISCHARGE OR ITCHING   Items with * indicate a potential emergency and should be followed up as soon as possible or go to the Emergency Department if any problems should occur.  Please show the CHEMOTHERAPY ALERT CARD or IMMUNOTHERAPY ALERT CARD at check-in to the Emergency Department and triage nurse.  Should you have questions after your visit or need to cancel  or reschedule your appointment, please contact [facility] [phone]  and follow the prompts.  Office hours are 8:00 a.m. to 4:30 p.m. Monday - Friday. Please note that voicemails left after 4:00 p.m. may not be returned until the following business day.  We are closed weekends and major holidays. You have access to a nurse at all times for urgent questions. Please call the main number to the clinic [phone] and follow the prompts.  For any non-urgent questions, you may also contact your provider using MyChart. We now offer e-Visits for anyone 18 and older to request care online for non-urgent symptoms. For details visit [email or web address].   Also download the MyChart app! Go to the app store, search "MyChart", open the app, select [facility], and log in with your MyChart username and password.  Due to Covid, a mask is required upon entering the hospital/clinic. If you do not have a mask, one will be given to you upon arrival. For doctor visits, patients may have 1 support person aged 18 or older with them. For treatment visits, patients cannot have anyone with them due to current Covid guidelines and our immunocompromised population.  

## 2021-05-28 NOTE — Progress Notes (Signed)
Patient presents today for chemotherapy injection.  Patient is in satisfactory condition with no complaints voiced.  Vital signs are stable.  Labs reviewed by Dr. Delton Coombes during his office visit.  Creatinine today is 3.24.  MD aware.  We will give NS 500 mL over over one hour per Dr. Delton Coombes.  All other labs are within treatment parameters.  Patient took pre-medications at home prior to office visit at 0830.  We will proceed with treatment per MD orders.   Patient tolerated fluids and injection well with no complaints voiced.  Patient left ambulatory in stable condition.  Vital signs stable at discharge.  Follow up as scheduled.

## 2021-05-28 NOTE — Patient Instructions (Signed)
[place] at [facility] Discharge Instructions   You were seen and examined today by Dr. Delton Coombes.  He reviewed your lab work which is normal/stable.   We will proceed with your treatment today.   Return as scheduled for lab work, office visit, and treatment.    Thank you for choosing [place] at [facility] to provide your oncology and hematology care.  To afford each patient quality time with our provider, please arrive at least 15 minutes before your scheduled appointment time.   If you have a lab appointment with the [place] please come in thru the Main Entrance and check in at the main information desk.  You need to re-schedule your appointment should you arrive 10 or more minutes late.  We strive to give you quality time with our providers, and arriving late affects you and other patients whose appointments are after yours.  Also, if you no show three or more times for appointments you may be dismissed from the clinic at the providers discretion.     Again, thank you for choosing [facility].  Our hope is that these requests will decrease the amount of time that you wait before being seen by our physicians.       _____________________________________________________________  Should you have questions after your visit to [facility], please contact our office at [phone] and follow the prompts.  Our office hours are 8:00 a.m. and 4:30 p.m. Monday - Friday.  Please note that voicemails left after 4:00 p.m. may not be returned until the following business day.  We are closed weekends and major holidays.  You do have access to a nurse 24-7, just call the main number to the clinic [phone] and do not press any options, hold on the line and a nurse will answer the phone.    For prescription refill requests, have your pharmacy contact our office and allow 72 hours.    Due to Covid, you will need to  wear a mask upon entering the hospital. If you do not have a mask, a mask will be given to you at the Main Entrance upon arrival. For doctor visits, patients may have 1 support person age 65 or older with them. For treatment visits, patients can not have anyone with them due to social distancing guidelines and our immunocompromised population.

## 2021-05-29 LAB — KAPPA/LAMBDA LIGHT CHAINS
Kappa free light chain: 12.8 mg/L (ref 3.3–19.4)
Kappa, lambda light chain ratio: 0.3 (ref 0.26–1.65)
Lambda free light chains: 42.8 mg/L — ABNORMAL HIGH (ref 5.7–26.3)

## 2021-06-01 LAB — PROTEIN ELECTROPHORESIS, SERUM
A/G Ratio: 1.2 (ref 0.7–1.7)
Albumin ELP: 2.7 g/dL — ABNORMAL LOW (ref 2.9–4.4)
Alpha-1-Globulin: 0.2 g/dL (ref 0.0–0.4)
Alpha-2-Globulin: 1.2 g/dL — ABNORMAL HIGH (ref 0.4–1.0)
Beta Globulin: 0.6 g/dL — ABNORMAL LOW (ref 0.7–1.3)
Gamma Globulin: 0.3 g/dL — ABNORMAL LOW (ref 0.4–1.8)
Globulin, Total: 2.3 g/dL (ref 2.2–3.9)
M-Spike, %: 0.1 g/dL — ABNORMAL HIGH
Total Protein ELP: 5 g/dL — ABNORMAL LOW (ref 6.0–8.5)

## 2021-06-01 LAB — UPEP/UIFE/LIGHT CHAINS/TP, 24-HR UR
% BETA, Urine: 14.9 %
ALPHA 1 URINE: 6.2 %
Albumin, U: 66.7 %
Alpha 2, Urine: 8.4 %
Free Kappa Lt Chains,Ur: 52.32 mg/L (ref 1.17–86.46)
Free Kappa/Lambda Ratio: 0.37 — ABNORMAL LOW (ref 1.83–14.26)
Free Lambda Lt Chains,Ur: 140.3 mg/L — ABNORMAL HIGH (ref 0.27–15.21)
GAMMA GLOBULIN URINE: 3.8 %
Total Protein, Urine-Ur/day: 16765 mg/24 hr — ABNORMAL HIGH (ref 30–150)
Total Protein, Urine: 931.4 mg/dL
Total Volume: 1800

## 2021-06-02 LAB — IMMUNOFIXATION ELECTROPHORESIS
IgA: 20 mg/dL — ABNORMAL LOW (ref 61–437)
IgG (Immunoglobin G), Serum: 281 mg/dL — ABNORMAL LOW (ref 603–1613)
IgM (Immunoglobulin M), Srm: 51 mg/dL (ref 15–143)
Total Protein ELP: 5.1 g/dL — ABNORMAL LOW (ref 6.0–8.5)

## 2021-06-03 ENCOUNTER — Other Ambulatory Visit (HOSPITAL_COMMUNITY): Payer: Self-pay

## 2021-06-03 MED ORDER — OXYCODONE HCL 5 MG PO TABS: 5.0000 mg | ORAL_TABLET | Freq: Three times a day (TID) | ORAL | 0 refills | Status: DC | PRN

## 2021-06-10 ENCOUNTER — Other Ambulatory Visit: Payer: Self-pay | Admitting: Oncology

## 2021-06-24 ENCOUNTER — Other Ambulatory Visit: Payer: Self-pay

## 2021-06-24 ENCOUNTER — Inpatient Hospital Stay (HOSPITAL_COMMUNITY): Payer: Medicare Other | Attending: Hematology

## 2021-06-24 ENCOUNTER — Inpatient Hospital Stay (HOSPITAL_COMMUNITY): Payer: Medicare Other

## 2021-06-24 ENCOUNTER — Inpatient Hospital Stay (HOSPITAL_BASED_OUTPATIENT_CLINIC_OR_DEPARTMENT_OTHER): Payer: Medicare Other | Admitting: Hematology

## 2021-06-24 DIAGNOSIS — Z5112 Encounter for antineoplastic immunotherapy: Secondary | ICD-10-CM | POA: Diagnosis present

## 2021-06-24 DIAGNOSIS — C9000 Multiple myeloma not having achieved remission: Secondary | ICD-10-CM | POA: Insufficient documentation

## 2021-06-24 DIAGNOSIS — E8581 Light chain (AL) amyloidosis: Secondary | ICD-10-CM | POA: Insufficient documentation

## 2021-06-24 LAB — CBC WITH DIFFERENTIAL/PLATELET
Abs Immature Granulocytes: 0.07 10*3/uL (ref 0.00–0.07)
Basophils Absolute: 0.1 10*3/uL (ref 0.0–0.1)
Basophils Relative: 1 %
Eosinophils Absolute: 0.6 10*3/uL — ABNORMAL HIGH (ref 0.0–0.5)
Eosinophils Relative: 6 %
HCT: 43.1 % (ref 39.0–52.0)
Hemoglobin: 13.8 g/dL (ref 13.0–17.0)
Immature Granulocytes: 1 %
Lymphocytes Relative: 17 %
Lymphs Abs: 1.9 10*3/uL (ref 0.7–4.0)
MCH: 32 pg (ref 26.0–34.0)
MCHC: 32 g/dL (ref 30.0–36.0)
MCV: 100 fL (ref 80.0–100.0)
Monocytes Absolute: 0.5 10*3/uL (ref 0.1–1.0)
Monocytes Relative: 5 %
Neutro Abs: 7.7 10*3/uL (ref 1.7–7.7)
Neutrophils Relative %: 70 %
Platelets: 317 10*3/uL (ref 150–400)
RBC: 4.31 MIL/uL (ref 4.22–5.81)
RDW: 15.6 % — ABNORMAL HIGH (ref 11.5–15.5)
WBC: 10.8 10*3/uL — ABNORMAL HIGH (ref 4.0–10.5)
nRBC: 0 % (ref 0.0–0.2)

## 2021-06-24 LAB — COMPREHENSIVE METABOLIC PANEL
ALT: 10 U/L (ref 0–44)
AST: 12 U/L — ABNORMAL LOW (ref 15–41)
Albumin: 2.8 g/dL — ABNORMAL LOW (ref 3.5–5.0)
Alkaline Phosphatase: 94 U/L (ref 38–126)
Anion gap: 9 (ref 5–15)
BUN: 36 mg/dL — ABNORMAL HIGH (ref 8–23)
CO2: 24 mmol/L (ref 22–32)
Calcium: 8.8 mg/dL — ABNORMAL LOW (ref 8.9–10.3)
Chloride: 104 mmol/L (ref 98–111)
Creatinine, Ser: 3.6 mg/dL — ABNORMAL HIGH (ref 0.61–1.24)
GFR, Estimated: 17 mL/min — ABNORMAL LOW (ref >60)
Glucose, Bld: 90 mg/dL (ref 70–99)
Potassium: 4.2 mmol/L (ref 3.5–5.1)
Sodium: 137 mmol/L (ref 135–145)
Total Bilirubin: 0.7 mg/dL (ref 0.3–1.2)
Total Protein: 5.7 g/dL — ABNORMAL LOW (ref 6.5–8.1)

## 2021-06-24 LAB — MAGNESIUM: Magnesium: 2.1 mg/dL (ref 1.7–2.4)

## 2021-06-24 LAB — LACTATE DEHYDROGENASE: LDH: 176 U/L (ref 98–192)

## 2021-06-24 MED ORDER — HEPARIN SOD (PORK) LOCK FLUSH 100 UNIT/ML IV SOLN
500.0000 [IU] | Freq: Once | INTRAVENOUS | Status: AC
  Administered 2021-06-24: 500 [IU] via INTRAVENOUS

## 2021-06-24 MED ORDER — DARATUMUMAB-HYALURONIDASE-FIHJ 1800-30000 MG-UT/15ML ~~LOC~~ SOLN
1800.0000 mg | Freq: Once | SUBCUTANEOUS | Status: AC
  Administered 2021-06-24: 1800 mg via SUBCUTANEOUS
  Filled 2021-06-24: qty 15

## 2021-06-24 MED ORDER — SODIUM CHLORIDE 0.9% FLUSH
10.0000 mL | Freq: Once | INTRAVENOUS | Status: AC
  Administered 2021-06-24: 10 mL via INTRAVENOUS

## 2021-06-24 MED ORDER — DEXAMETHASONE 6 MG PO TABS: 20.0000 mg | ORAL_TABLET | Freq: Once | ORAL | Status: DC

## 2021-06-24 MED ORDER — DEXAMETHASONE 4 MG PO TABS: 20.0000 mg | ORAL_TABLET | Freq: Once | ORAL | Status: DC

## 2021-06-24 MED ORDER — ACETAMINOPHEN 325 MG PO TABS: 650.0000 mg | ORAL_TABLET | Freq: Once | ORAL | Status: DC

## 2021-06-24 MED ORDER — SODIUM CHLORIDE 0.9 % IV SOLN: Freq: Once | INTRAVENOUS | Status: DC

## 2021-06-24 MED ORDER — DIPHENHYDRAMINE HCL 25 MG PO CAPS: 50.0000 mg | ORAL_CAPSULE | Freq: Once | ORAL | Status: DC

## 2021-06-24 NOTE — Progress Notes (Signed)
Patients port flushed without difficulty.  Good blood return noted with no bruising or swelling noted at site. Patient remains accessed.  

## 2021-06-24 NOTE — Progress Notes (Signed)
? ?[place] ?[address] ?[place], [place] [address] ? ? ?CLINIC:  ?Medical Oncology/Hematology ? ?PCP:  ?Pcp, No ?None ?None ? ? 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 ?PRIOR THERAPY: none ? ?NGS Results: not done ? ?CURRENT THERAPY: DaraCyBorD & Aloxi weekly ? ?BRIEF ONCOLOGIC HISTORY:  ?Oncology History  ?Multiple myeloma ([place])  ?03/06/2020 Initial Diagnosis  ? Multiple myeloma ([place]) ?  ?03/06/2020 Cancer Staging  ? Staging form: Plasma Cell Myeloma and Plasma Cell Disorders, AJCC 8th Edition ?- Clinical stage from 03/06/2020: RISS Stage II (Beta-2-microglobulin (mg/L): 4.5, Albumin (g/dL): 2.5, ISS: Stage II, High-risk cytogenetics: Absent, LDH: Elevated) - Signed by Derek Jack, MD on 03/06/2020 ? ?  ?03/20/2020 - 03/20/2020 Chemotherapy  ? The patient had dexamethasone (DECADRON) tablet 40 mg, 40 mg, Oral,  Once, 0 of 4 cycles ?bortezomib SQ (VELCADE) chemo injection (2.2m/mL concentration) 2.75 mg, 1.3 mg/m2 = 2.75 mg, Subcutaneous,  Once, 0 of 4 cycles ? ? for chemotherapy treatment.  ? ?  ?03/20/2020 -  Chemotherapy  ? Patient is on Treatment Plan : PRIMARY AMYLOIDOSIS DaraCyBorD (Daratumumab SQ + Cyclophosphamide PO + Bortezomib SQ + Dexamethasone PO/IV) q28d x 6 cycles / Daratumumab SQ q28d  ?   ? ? ?CANCER STAGING: ? Cancer Staging  ?Multiple myeloma ([facility] ?Staging form: Plasma Cell Myeloma and Plasma Cell Disorders, AJCC 8th Edition ?- Clinical stage from 03/06/2020: RISS Stage II (Beta-2-microglobulin (mg/L): 4.5, Albumin (g/dL): 2.5, ISS: Stage II, High-risk cytogenetics: Absent, LDH: Elevated) - Signed by KDerek Jack MD on 03/06/2020 ? ? ?INTERVAL HISTORY:  ?[name] a 71y.o. male, returns for routine follow-up and consideration for next cycle of chemotherapy. TKeyonwas last seen on 05/28/2021. ? ?Due for cycle #18 of DARZALEX FASPRO today.  ? ?Overall, he tells me he has been feeling pretty well. He denies n/v/d  and recent falls. He is eating well, and his weight is stable. His appetite is fair. His leg pains are stable. He continues to have decreased hearing in his right ear. He denies tingling/numbness and recent infections.  ? ?Overall, he feels ready for next cycle of chemo today.  ? ?REVIEW OF SYSTEMS:  ?Review of Systems  ?Constitutional:  Negative for appetite change, fatigue and unexpected weight change.  ?HENT:   Positive for hearing loss (R ear).   ?Gastrointestinal:  Negative for diarrhea, nausea and vomiting.  ?Musculoskeletal:  Positive for arthralgias (legs -stable).  ?Neurological:  Negative for numbness.  ?All other systems reviewed and are negative. ? ?PAST MEDICAL/SURGICAL HISTORY:  ?Past Medical History:  ?Diagnosis Date  ? CAD (coronary artery disease)   ? GERD (gastroesophageal reflux disease)   ? Hypertension   ? Myocardial infarction ([name]  ? Peripheral vascular disease ([facility]   ? Stroke ([name]   ? ?Past Surgical History:  ?Procedure Laterality Date  ? BYPASS GRAFT POPLITEAL TO POPLITEAL Right 03/23/2018  ? Procedure: BYPASS GRAFT RIGHT ABOVE KNEE POPLITEAL TO BELOW KNEE POPLITEAL ARTERY  USING RIGHT GREAT SAPHENOUS VEIN;  Surgeon: CMarty Heck MD;  [place] [facility]  Service: Vascular;  Laterality: Right;  ? BYPASS GRAFT POPLITEAL TO POPLITEAL Left 04/09/2019  ? Procedure: BYPASS  ABOVE KNEE POPLITEAL TO BELOW KNEE POPLITEAL AND LIGATION OF LEFT POPITEAL ANEURYSM;  Surgeon: CMarty Heck MD;  [place] [facility]  Service: Vascular;  Laterality: Left;  ? COLONOSCOPY WITH ESOPHAGOGASTRODUODENOSCOPY (EGD)    ? CORONARY ARTERY BYPASS GRAFT    ? EYE  SURGERY Bilateral   ? cataracts  ? FEMORAL BYPASS Right 03/23/2018  ? HERNIA REPAIR    ? IR IMAGING GUIDED PORT INSERTION  03/25/2020  ? MASTOIDECTOMY    ? ROTATOR CUFF REPAIR Right   ? TONSILLECTOMY    ? VEIN HARVEST Right 03/23/2018  ? Procedure: VEIN HARVEST RIGHT GREAT SAPHENOUS;  Surgeon: Marty Heck, MD;  Location: [place];  Service:  Vascular;  Laterality: Right;  ? VEIN HARVEST Left 04/09/2019  ? Procedure: Harvest Greater Saphenous Vein and  Revise Elisa Lateral Saphenous Vein ;  Surgeon: Marty Heck, MD;  Location: [place];  Service: Vascular;  Laterality: Left;  ? ? ?SOCIAL HISTORY:  ?Social History  ? ?Socioeconomic History  ? Marital status: Married  ?  Spouse name: Not on file  ? Number of children: Not on file  ? Years of education: Not on file  ? Highest education level: Not on file  ?Occupational History  ? Not on file  ?Tobacco Use  ? Smoking status: Never  ? Smokeless tobacco: Never  ?Vaping Use  ? Vaping Use: Never used  ?Substance and Sexual Activity  ? Alcohol use: Never  ? Drug use: Never  ? Sexual activity: Not on file  ?Other Topics Concern  ? Not on file  ?Social History Narrative  ? Not on file  ? ?Social Determinants of Health  ? ?Financial Resource Strain: Not on file  ?Food Insecurity: Not on file  ?Transportation Needs: Not on file  ?Physical Activity: Not on file  ?Stress: Not on file  ?Social Connections: Not on file  ?Intimate Partner Violence: Not on file  ? ? ?FAMILY HISTORY:  ?Family History  ?Problem Relation Age of Onset  ? Heart disease Mother   ? ? ?CURRENT MEDICATIONS:  ?Current Outpatient Medications  ?Medication Sig Dispense Refill  ? acetaminophen (TYLENOL) 500 MG tablet Take 500 mg by mouth 2 (two) times daily.    ? acyclovir (ZOVIRAX) 400 MG tablet Take 1 tablet (400 mg total) by mouth 2 (two) times daily. 60 tablet 5  ? ALPRAZolam (XANAX) 0.25 MG tablet Take 1 tablet (0.25 mg total) by mouth in the morning, at noon, in the evening, and at bedtime. 120 tablet 5  ? Apixaban (ELIQUIS PO) Take by mouth.    ? aspirin 81 MG tablet Take 81 mg by mouth daily.    ? atorvastatin (LIPITOR) 80 MG tablet Take 80 mg by mouth at bedtime.     ? carvedilol (COREG) 25 MG tablet Take 25 mg by mouth daily.    ? chlorproMAZINE (THORAZINE) 25 MG tablet Take 1 tablet (25 mg total) by mouth 4 (four) times daily as needed.  For hiccups 60 tablet 2  ? dexamethasone (DECADRON) 4 MG tablet Take 57m (5 tabs) one hour prior to injection appointment every 28 days 5 tablet 11  ? diazepam (VALIUM) 2 MG tablet TAKE ONE TABLET BY MOUTH DAILY AS NEEDED FOR DIZZINESS 30 tablet 5  ? empagliflozin (JARDIANCE) 10 MG TABS tablet Take 10 mg by mouth daily.    ? ergocalciferol (VITAMIN D2) 1.25 MG (50000 UT) capsule Take 1 capsule (50,000 Units total) by mouth once a week. 8 capsule 3  ? gabapentin (NEURONTIN) 300 MG capsule Take 1 capsule (300 mg total) by mouth daily. 30 capsule 6  ? HYDROcodone-acetaminophen (NORCO) 5-325 MG tablet Take 1 tablet by mouth every 8 (eight) hours as needed for moderate pain. 90 tablet 0  ? icosapent Ethyl (VASCEPA) 1 g  capsule Take 1 g by mouth 2 (two) times daily.    ? Iron-Vitamin C (VITRON-C) 65-125 MG TABS Take 1 tablet by mouth daily. 60 tablet 3  ? lidocaine (XYLOCAINE) 2 % solution Use as directed 15 mLs in the mouth or throat as needed for mouth pain. 15 mL 3  ? magnesium oxide (MAG-OX) 400 (241.3 Mg) MG tablet Take 1 tablet (400 mg total) by mouth daily. 30 tablet 0  ? magnesium oxide (MAG-OX) 400 MG tablet Take 1 tablet (400 mg total) by mouth daily. 30 tablet 3  ? meclizine (ANTIVERT) 25 MG tablet Take 1 tablet (25 mg total) by mouth 2 (two) times daily as needed for dizziness. 30 tablet 3  ? megestrol (MEGACE) 400 MG/10ML suspension Take 10 mLs (400 mg total) by mouth 2 (two) times daily. 480 mL 2  ? metoprolol succinate (TOPROL-XL) 25 MG 24 hr tablet Take 25 mg by mouth daily.    ? Multiple Minerals (CALCIUM/MAGNESIUM/ZINC PO) Take 1 tablet by mouth at bedtime.    ? oxyCODONE (OXY IR/ROXICODONE) 5 MG immediate release tablet Take 1 tablet (5 mg total) by mouth every 8 (eight) hours as needed for severe pain. 84 tablet 0  ? pantoprazole (PROTONIX) 40 MG tablet Take 1 tablet (40 mg total) by mouth daily. 30 tablet 6  ? potassium chloride (KLOR-CON) 10 MEQ tablet Take 1 tablet (10 mEq total) by mouth 2 (two)  times daily. 90 tablet 3  ? prochlorperazine (COMPAZINE) 10 MG tablet Take 1 tablet (10 mg total) by mouth every 6 (six) hours as needed for nausea or vomiting. 60 tablet 3  ? sacubitril-valsartan (ENTR

## 2021-06-24 NOTE — Patient Instructions (Signed)
[place] at [facility] ?Discharge Instructions ? ? ?You were seen and examined today by Dr. Delton Coombes. ? ?He reviewed the results of your lab work.  Your creatinine has increased again.  Dr. Raliegh Ip recommends seeing your kidney doctor to make him aware of this. All other labs are normal/stable. ? ?We will proceed with treatment today. ? ?Return as scheduled in 4 weeks.  ? ? ?Thank you for choosing [place] at [facility] to provide your oncology and hematology care.  To afford each patient quality time with our provider, please arrive at least 15 minutes before your scheduled appointment time.  ? ?If you have a lab appointment with the [place] please come in thru the Main Entrance and check in at the main information desk. ? ?You need to re-schedule your appointment should you arrive 10 or more minutes late.  We strive to give you quality time with our providers, and arriving late affects you and other patients whose appointments are after yours.  Also, if you no show three or more times for appointments you may be dismissed from the clinic at the providers discretion.     ?Again, thank you for choosing [facility].  Our hope is that these requests will decrease the amount of time that you wait before being seen by our physicians.       ?_____________________________________________________________ ? ?Should you have questions after your visit to [facility], please contact our office at [phone] and follow the prompts.  Our office hours are 8:00 a.m. and 4:30 p.m. Monday - Friday.  Please note that voicemails left after 4:00 p.m. may not be returned until the following business day.  We are closed weekends and major holidays.  You do have access to a nurse 24-7, just call the main number to the clinic [phone] and do not press any options, hold on the line and a nurse will answer the phone.   ? ?For prescription refill  requests, have your pharmacy contact our office and allow 72 hours.   ? ?Due to Covid, you will need to wear a mask upon entering the hospital. If you do not have a mask, a mask will be given to you at the Main Entrance upon arrival. For doctor visits, patients may have 1 support person age 44 or older with them. For treatment visits, patients can not have anyone with them due to social distancing guidelines and our immunocompromised population.  ? ?   ?

## 2021-06-24 NOTE — Progress Notes (Signed)
Patient presents today for treatment and follow up visit with Dr. Delton Coombes. Labs reviewed. Creatinine 3.60 .  ? ?Labs reviewed by MD. Message received from A. Ouida Sills RN / Dr. Delton Coombes to proceed with treatment.  ? ?Patient takes premeds at home prior to arrival. Patient took Tylenol 650 mgs PO, Benadryl 50 mgs PO,  Decadron 20 mgs.  ? ?Treatment given today per MD orders. Tolerated without adverse affects. Vital signs stable. No complaints at this time. Discharged from clinic ambulatory in stable condition. Alert and oriented x 3. F/U with [facility] as scheduled.   ?

## 2021-06-24 NOTE — Progress Notes (Signed)
Patient has been examined by Dr. Katragadda, and vital signs and labs have been reviewed. ANC, Creatinine, LFTs, hemoglobin, and platelets are within treatment parameters per M.D. - pt may proceed with treatment.    °

## 2021-06-24 NOTE — Patient Instructions (Signed)
[place]  Discharge Instructions: ?Thank you for choosing [place] to provide your oncology and hematology care.  ?If you have a lab appointment with the [facility], please come in thru the Main Entrance and check in at the main information desk. ? ?Wear comfortable clothing and clothing appropriate for easy access to any Portacath or PICC line.  ? ?We strive to give you quality time with your provider. You may need to reschedule your appointment if you arrive late (15 or more minutes).  Arriving late affects you and other patients whose appointments are after yours.  Also, if you miss three or more appointments without notifying the office, you may be dismissed from the clinic at the provider?s discretion.    ?  ?For prescription refill requests, have your pharmacy contact our office and allow 72 hours for refills to be completed.   ? ?Today you received the following chemotherapy : Darzalex Faspro [place].     ?  ?To help prevent nausea and vomiting after your treatment, we encourage you to take your nausea medication as directed. ? ?BELOW ARE SYMPTOMS THAT SHOULD BE REPORTED IMMEDIATELY: ?*FEVER GREATER THAN 100.4 F (38 ?C) OR HIGHER ?*CHILLS OR SWEATING ?*NAUSEA AND VOMITING THAT IS NOT CONTROLLED WITH YOUR NAUSEA MEDICATION ?*UNUSUAL SHORTNESS OF BREATH ?*UNUSUAL BRUISING OR BLEEDING ?*URINARY PROBLEMS (pain or burning when urinating, or frequent urination) ?*BOWEL PROBLEMS (unusual diarrhea, constipation, pain near the anus) ?TENDERNESS IN MOUTH AND THROAT WITH OR WITHOUT PRESENCE OF ULCERS (sore throat, sores in mouth, or a toothache) ?UNUSUAL RASH, SWELLING OR PAIN  ?UNUSUAL VAGINAL DISCHARGE OR ITCHING  ? ?Items with * indicate a potential emergency and should be followed up as soon as possible or go to the Emergency Department if any problems should occur. ? ?Please show the CHEMOTHERAPY ALERT CARD or IMMUNOTHERAPY ALERT CARD at check-in to the Emergency Department and triage  nurse. ? ?Should you have questions after your visit or need to cancel or reschedule your appointment, please contact [facility] [phone]  and follow the prompts.  Office hours are 8:00 a.m. to 4:30 p.m. Monday - Friday. Please note that voicemails left after 4:00 p.m. may not be returned until the following business day.  We are closed weekends and major holidays. You have access to a nurse at all times for urgent questions. Please call the main number to the clinic [phone] and follow the prompts. ? ?For any non-urgent questions, you may also contact your provider using MyChart. We now offer e-Visits for anyone 27 and older to request care online for non-urgent symptoms. For details visit [email or web address]. ?  ?Also download the MyChart app! Go to the app store, search "MyChart", open the app, select , and log in with your MyChart username and password. ? ?Due to Covid, a mask is required upon entering the hospital/clinic. If you do not have a mask, one will be given to you upon arrival. For doctor visits, patients may have 1 support person aged 19 or older with them. For treatment visits, patients cannot have anyone with them due to current Covid guidelines and our immunocompromised population.  ?

## 2021-06-25 ENCOUNTER — Other Ambulatory Visit (HOSPITAL_COMMUNITY): Payer: Medicare Other

## 2021-06-25 ENCOUNTER — Ambulatory Visit (HOSPITAL_COMMUNITY): Payer: Medicare Other

## 2021-06-25 ENCOUNTER — Ambulatory Visit (HOSPITAL_COMMUNITY): Payer: Medicare Other | Admitting: Hematology

## 2021-06-25 LAB — KAPPA/LAMBDA LIGHT CHAINS
Kappa free light chain: 16.7 mg/L (ref 3.3–19.4)
Kappa, lambda light chain ratio: 0.29 (ref 0.26–1.65)
Lambda free light chains: 57.5 mg/L — ABNORMAL HIGH (ref 5.7–26.3)

## 2021-06-26 ENCOUNTER — Encounter (HOSPITAL_COMMUNITY): Payer: Self-pay | Admitting: Hematology

## 2021-06-26 LAB — PROTEIN ELECTROPHORESIS, SERUM
A/G Ratio: 1.3 (ref 0.7–1.7)
Albumin ELP: 2.9 g/dL (ref 2.9–4.4)
Alpha-1-Globulin: 0.2 g/dL (ref 0.0–0.4)
Alpha-2-Globulin: 1.2 g/dL — ABNORMAL HIGH (ref 0.4–1.0)
Beta Globulin: 0.6 g/dL — ABNORMAL LOW (ref 0.7–1.3)
Gamma Globulin: 0.2 g/dL — ABNORMAL LOW (ref 0.4–1.8)
Globulin, Total: 2.2 g/dL (ref 2.2–3.9)
M-Spike, %: 0.1 g/dL — ABNORMAL HIGH
Total Protein ELP: 5.1 g/dL — ABNORMAL LOW (ref 6.0–8.5)

## 2021-06-26 LAB — IMMUNOFIXATION ELECTROPHORESIS
IgA: 22 mg/dL — ABNORMAL LOW (ref 61–437)
IgG (Immunoglobin G), Serum: 257 mg/dL — ABNORMAL LOW (ref 603–1613)
IgM (Immunoglobulin M), Srm: 57 mg/dL (ref 15–143)
Total Protein ELP: 5.2 g/dL — ABNORMAL LOW (ref 6.0–8.5)

## 2021-06-26 NOTE — Progress Notes (Signed)
Treatment given today per MD orders. Tolerated without adverse affects. Vital signs stable. No complaints at this time. Discharged from clinic ambulatory in stable condition. Alert and oriented x 3. F/U with [facility] as scheduled.   

## 2021-06-29 ENCOUNTER — Other Ambulatory Visit (HOSPITAL_COMMUNITY): Payer: Self-pay

## 2021-06-29 MED ORDER — MAGNESIUM OXIDE 400 MG PO TABS: 400.0000 mg | ORAL_TABLET | Freq: Every day | ORAL | 3 refills | Status: AC

## 2021-06-29 MED ORDER — PANTOPRAZOLE SODIUM 40 MG PO TBEC: 40.0000 mg | DELAYED_RELEASE_TABLET | Freq: Every day | ORAL | 6 refills | Status: AC

## 2021-06-29 MED ORDER — HYDROCODONE-ACETAMINOPHEN 5-325 MG PO TABS: 1.0000 | ORAL_TABLET | Freq: Three times a day (TID) | ORAL | 0 refills | Status: DC | PRN

## 2021-06-29 MED ORDER — TRAMADOL HCL 50 MG PO TABS
ORAL_TABLET | ORAL | 0 refills | Status: DC
Start: 2021-06-29 — End: 2021-08-17

## 2021-06-29 MED ORDER — ALPRAZOLAM 0.25 MG PO TABS: 0.2500 mg | ORAL_TABLET | Freq: Four times a day (QID) | ORAL | 5 refills | Status: AC

## 2021-07-07 ENCOUNTER — Other Ambulatory Visit (HOSPITAL_COMMUNITY): Payer: Self-pay

## 2021-07-08 ENCOUNTER — Encounter (HOSPITAL_COMMUNITY): Payer: Self-pay | Admitting: Hematology

## 2021-07-08 MED ORDER — OXYCODONE HCL 5 MG PO TABS: 5.0000 mg | ORAL_TABLET | Freq: Three times a day (TID) | ORAL | 0 refills | Status: DC | PRN

## 2021-07-22 ENCOUNTER — Inpatient Hospital Stay (HOSPITAL_COMMUNITY): Payer: Medicare Other

## 2021-07-22 ENCOUNTER — Inpatient Hospital Stay (HOSPITAL_COMMUNITY): Payer: Medicare Other | Attending: Hematology

## 2021-07-22 ENCOUNTER — Inpatient Hospital Stay (HOSPITAL_BASED_OUTPATIENT_CLINIC_OR_DEPARTMENT_OTHER): Payer: Medicare Other | Admitting: Hematology

## 2021-07-22 VITALS — BP 122/91 | HR 84 | Temp 97.3°F | Resp 18 | Ht 67.72 in | Wt 187.2 lb

## 2021-07-22 DIAGNOSIS — C9000 Multiple myeloma not having achieved remission: Secondary | ICD-10-CM

## 2021-07-22 DIAGNOSIS — Z5111 Encounter for antineoplastic chemotherapy: Secondary | ICD-10-CM | POA: Insufficient documentation

## 2021-07-22 DIAGNOSIS — E8581 Light chain (AL) amyloidosis: Secondary | ICD-10-CM | POA: Diagnosis not present

## 2021-07-22 DIAGNOSIS — Z5112 Encounter for antineoplastic immunotherapy: Secondary | ICD-10-CM | POA: Insufficient documentation

## 2021-07-22 LAB — CBC WITH DIFFERENTIAL/PLATELET
Abs Immature Granulocytes: 0.06 10*3/uL (ref 0.00–0.07)
Basophils Absolute: 0.1 10*3/uL (ref 0.0–0.1)
Basophils Relative: 1 %
Eosinophils Absolute: 0.6 10*3/uL — ABNORMAL HIGH (ref 0.0–0.5)
Eosinophils Relative: 5 %
HCT: 43 % (ref 39.0–52.0)
Hemoglobin: 13.8 g/dL (ref 13.0–17.0)
Immature Granulocytes: 1 %
Lymphocytes Relative: 19 %
Lymphs Abs: 2.3 10*3/uL (ref 0.7–4.0)
MCH: 31.7 pg (ref 26.0–34.0)
MCHC: 32.1 g/dL (ref 30.0–36.0)
MCV: 98.6 fL (ref 80.0–100.0)
Monocytes Absolute: 0.8 10*3/uL (ref 0.1–1.0)
Monocytes Relative: 7 %
Neutro Abs: 7.8 10*3/uL — ABNORMAL HIGH (ref 1.7–7.7)
Neutrophils Relative %: 67 %
Platelets: 322 10*3/uL (ref 150–400)
RBC: 4.36 MIL/uL (ref 4.22–5.81)
RDW: 15.5 % (ref 11.5–15.5)
WBC: 11.6 10*3/uL — ABNORMAL HIGH (ref 4.0–10.5)
nRBC: 0 % (ref 0.0–0.2)

## 2021-07-22 LAB — COMPREHENSIVE METABOLIC PANEL
ALT: 10 U/L (ref 0–44)
AST: 12 U/L — ABNORMAL LOW (ref 15–41)
Albumin: 2.6 g/dL — ABNORMAL LOW (ref 3.5–5.0)
Alkaline Phosphatase: 94 U/L (ref 38–126)
Anion gap: 7 (ref 5–15)
BUN: 38 mg/dL — ABNORMAL HIGH (ref 8–23)
CO2: 24 mmol/L (ref 22–32)
Calcium: 8.6 mg/dL — ABNORMAL LOW (ref 8.9–10.3)
Chloride: 108 mmol/L (ref 98–111)
Creatinine, Ser: 3.92 mg/dL — ABNORMAL HIGH (ref 0.61–1.24)
GFR, Estimated: 16 mL/min — ABNORMAL LOW (ref >60)
Glucose, Bld: 91 mg/dL (ref 70–99)
Potassium: 4.2 mmol/L (ref 3.5–5.1)
Sodium: 139 mmol/L (ref 135–145)
Total Bilirubin: 0.4 mg/dL (ref 0.3–1.2)
Total Protein: 5.4 g/dL — ABNORMAL LOW (ref 6.5–8.1)

## 2021-07-22 LAB — LACTATE DEHYDROGENASE: LDH: 158 U/L (ref 98–192)

## 2021-07-22 LAB — MAGNESIUM: Magnesium: 2.3 mg/dL (ref 1.7–2.4)

## 2021-07-22 MED ORDER — DEXAMETHASONE 4 MG PO TABS: 20.0000 mg | ORAL_TABLET | ORAL | 5 refills | Status: AC

## 2021-07-22 MED ORDER — DIPHENHYDRAMINE HCL 25 MG PO CAPS
50.0000 mg | ORAL_CAPSULE | Freq: Once | ORAL | Status: AC
  Filled 2021-07-22: qty 2

## 2021-07-22 MED ORDER — DEXAMETHASONE 4 MG PO TABS
20.0000 mg | ORAL_TABLET | Freq: Once | ORAL | Status: AC
  Administered 2021-07-22: 20 mg via ORAL
  Filled 2021-07-22: qty 5

## 2021-07-22 MED ORDER — DARATUMUMAB-HYALURONIDASE-FIHJ 1800-30000 MG-UT/15ML ~~LOC~~ SOLN
1800.0000 mg | Freq: Once | SUBCUTANEOUS | Status: AC
  Administered 2021-07-22: 1800 mg via SUBCUTANEOUS
  Filled 2021-07-22: qty 15

## 2021-07-22 MED ORDER — ACETAMINOPHEN 325 MG PO TABS
650.0000 mg | ORAL_TABLET | Freq: Once | ORAL | Status: AC
  Filled 2021-07-22: qty 2

## 2021-07-22 MED ORDER — SODIUM CHLORIDE 0.9% FLUSH
10.0000 mL | Freq: Once | INTRAVENOUS | Status: AC
  Administered 2021-07-22: 10 mL via INTRAVENOUS

## 2021-07-22 MED ORDER — BORTEZOMIB CHEMO SQ INJECTION 3.5 MG (2.5MG/ML)
1.3000 mg/m2 | Freq: Once | INTRAMUSCULAR | Status: AC
  Administered 2021-07-22: 2.75 mg via SUBCUTANEOUS
  Filled 2021-07-22: qty 1.1

## 2021-07-22 MED ORDER — HEPARIN SOD (PORK) LOCK FLUSH 100 UNIT/ML IV SOLN
500.0000 [IU] | Freq: Once | INTRAVENOUS | Status: AC
  Administered 2021-07-22: 500 [IU] via INTRAVENOUS

## 2021-07-22 NOTE — Patient Instructions (Signed)
[place] at [facility] ?Discharge Instructions ? ? ?You were seen and examined today by Dr. Delton Coombes. ? ?He reviewed the results of your lab work. Your kidney number continues to climb. We will follow up on the urine results. ? ?We will send prescription for dexamethasone (steroid). You will take 5 pills once a week at home. We will give you a dose here today in the clinic. ? ?Return as scheduled in 4 weeks.  ? ? ?Thank you for choosing [place] at [facility] to provide your oncology and hematology care.  To afford each patient quality time with our provider, please arrive at least 15 minutes before your scheduled appointment time.  ? ?If you have a lab appointment with the [place] please come in thru the Main Entrance and check in at the main information desk. ? ?You need to re-schedule your appointment should you arrive 10 or more minutes late.  We strive to give you quality time with our providers, and arriving late affects you and other patients whose appointments are after yours.  Also, if you no show three or more times for appointments you may be dismissed from the clinic at the providers discretion.     ?Again, thank you for choosing [facility].  Our hope is that these requests will decrease the amount of time that you wait before being seen by our physicians.       ?_____________________________________________________________ ? ?Should you have questions after your visit to [facility], please contact our office at [phone] and follow the prompts.  Our office hours are 8:00 a.m. and 4:30 p.m. Monday - Friday.  Please note that voicemails left after 4:00 p.m. may not be returned until the following business day.  We are closed weekends and major holidays.  You do have access to a nurse 24-7, just call the main number to the clinic [phone] and do not press any options, hold on the line and a nurse will answer  the phone.   ? ?For prescription refill requests, have your pharmacy contact our office and allow 72 hours.   ? ?Due to Covid, you will need to wear a mask upon entering the hospital. If you do not have a mask, a mask will be given to you at the Main Entrance upon arrival. For doctor visits, patients may have 1 support person age 48 or older with them. For treatment visits, patients can not have anyone with them due to social distancing guidelines and our immunocompromised population.  ? ?   ?

## 2021-07-22 NOTE — Progress Notes (Signed)
? ?Julian Miranda ?[address] ?[place], [place] [address] ? ? ?CLINIC:  ?Medical Oncology/Hematology ? ?PCP:  ?Pcp, No ?None ?None ? ? 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 ?PRIOR THERAPY: none ? ?NGS Results: not done ? ?CURRENT THERAPY: DaraCyBorD & Aloxi weekly ? ?BRIEF ONCOLOGIC HISTORY:  ?Oncology History  ?Multiple myeloma ([place])  ?03/06/2020 Initial Diagnosis  ? Multiple myeloma ([place]) ? ?  ?03/06/2020 Cancer Staging  ? Staging form: Plasma Cell Myeloma and Plasma Cell Disorders, AJCC 8th Edition ?- Clinical stage from 03/06/2020: RISS Stage II (Beta-2-microglobulin (mg/L): 4.5, Albumin (g/dL): 2.5, ISS: Stage II, High-risk cytogenetics: Absent, LDH: Elevated) - Signed by Derek Jack, MD on 03/06/2020 ? ?  ?03/20/2020 - 03/20/2020 Chemotherapy  ? The patient had dexamethasone (DECADRON) tablet 40 mg, 40 mg, Oral,  Once, 0 of 4 cycles ?bortezomib SQ (VELCADE) chemo injection (2.58m/mL concentration) 2.75 mg, 1.3 mg/m2 = 2.75 mg, Subcutaneous,  Once, 0 of 4 cycles ? ? for chemotherapy treatment.  ? ?  ?03/20/2020 -  Chemotherapy  ? Patient is on Treatment Plan : PRIMARY AMYLOIDOSIS DaraCyBorD (Daratumumab SQ + Cyclophosphamide PO + Bortezomib SQ + Dexamethasone PO/IV) q28d x 6 cycles / Daratumumab SQ q28d  ? ?  ?  ? ? ?CANCER STAGING: ? Cancer Staging  ?[place] myel[place] ([place] ?Staging form: Plasma Cell Myeloma and Plasma Cell Disorders, AJCC 8th Edition ?- Clinical stage from 03/06/2020: RISS Stage II (Beta-2-microglobulin (mg/L): 4.5, Albumin (g/dL): 2.5, ISS: Stage II, High-risk cytogenetics: Absent, LDH: Elevated) - Signed by KDerek Jack MD on 03/06/2020 ? ? ?INTERVAL HISTORY:  ?Julian Miranda a 71y.o. male, returns for routine follow-up and consideration for next cycle of chemotherapy. TMuneerwas last seen on 06/24/2021. ? ?Due for cycle #19 of Darzalex Faspro today.  ? ?Overall, he tells me he has been feeling pretty well. He denies  new pains, tingling/numbness, and recent falls. His leg pains are controlled. His appetite is good.  ? ?Overall, he feels ready for next cycle of chemo today.  ? ? ?REVIEW OF SYSTEMS:  ?Review of Systems  ?Constitutional:  Negative for appetite change and fatigue.  ?Musculoskeletal:  Positive for arthralgias (legs controlled).  ?Neurological:  Negative for numbness.  ?All other systems reviewed and are negative. ? ?PAST MEDICAL/SURGICAL HISTORY:  ?Past Medical History:  ?Diagnosis Date  ? CAD (coronary artery disease)   ? GERD (gastroesophageal reflux disease)   ? Hypertension   ? Myocardial infarction ([facility] 1994  ? [place]ipheral vascular disease ([place]   ? [facility] ([facility]   ? ?Past Surgical History:  ?Procedure Laterality Date  ? BYPASS GRAFT POPLITEAL TO POPLITEAL Right 03/23/2018  ? Procedure: BYPASS GRAFT RIGHT ABOVE KNEE POPLITEAL TO BELOW KNEE POPLITEAL ARTERY  USING RIGHT GREAT SAPHENOUS VEIN;  Surgeon: CMarty Heck MD;  [facility] [facility]  Service: Vascular;  Laterality: Right;  ? BYPASS GRAFT POPLITEAL TO POPLITEAL Left 04/09/2019  ? Procedure: BYPASS  ABOVE KNEE POPLITEAL TO BELOW KNEE POPLITEAL AND LIGATION OF LEFT POPITEAL ANEURYSM;  Surgeon: CMarty Heck MD;  [place] [place]  Service: Vascular;  Laterality: Left;  ? COLONOSCOPY WITH ESOPHAGOGASTRODUODENOSCOPY (EGD)    ? CORONARY ARTERY BYPASS GRAFT    ? EYE SURGERY Bilateral   ? cataracts  ? FEMORAL BYPASS Right 03/23/2018  ? HERNIA REPAIR    ? IR IMAGING GUIDED PORT INSERTION  03/25/2020  ? MASTOIDECTOMY    ? ROTATOR CUFF REPAIR Right   ? TONSILLECTOMY    ?  VEIN HARVEST Right 03/23/2018  ? Procedure: VEIN HARVEST RIGHT GREAT SAPHENOUS;  Surgeon: Marty Heck, MD;  Location: [place];  Service: Vascular;  Laterality: Right;  ? VEIN HARVEST Left 04/09/2019  ? Procedure: Harvest Greater Saphenous Vein and  Revise Elisa Lateral Saphenous Vein ;  Surgeon: Marty Heck, MD;  Location: [place];  Service: Vascular;  Laterality: Left;   ? ? ?SOCIAL HISTORY:  ?Social History  ? ?Socioeconomic History  ? Marital status: Married  ?  Spouse name: Not on file  ? Number of children: Not on file  ? Years of education: Not on file  ? Highest education level: Not on file  ?Occupational History  ? Not on file  ?Tobacco Use  ? Smoking status: Never  ? Smokeless tobacco: Never  ?Vaping Use  ? Vaping Use: Never used  ?Substance and Sexual Activity  ? Alcohol use: Never  ? Drug use: Never  ? Sexual activity: Not on file  ?Other Topics Concern  ? Not on file  ?Social History Narrative  ? Not on file  ? ?Social Determinants of Health  ? ?Financial Resource Strain: Not on file  ?Food Insecurity: Not on file  ?Transportation Needs: Not on file  ?Physical Activity: Not on file  ?Stress: Not on file  ?Social Connections: Not on file  ?Intimate Partner Violence: Not on file  ? ? ?FAMILY HISTORY:  ?Family History  ?Problem Relation Age of Onset  ? Heart disease Mother   ? ? ?CURRENT MEDICATIONS:  ?Current Outpatient Medications  ?Medication Sig Dispense Refill  ? acetaminophen (TYLENOL) 500 MG tablet Take 500 mg by mouth 2 (two) times daily.    ? acyclovir (ZOVIRAX) 400 MG tablet Take 1 tablet (400 mg total) by mouth 2 (two) times daily. 60 tablet 5  ? ALPRAZolam (XANAX) 0.25 MG tablet Take 1 tablet (0.25 mg total) by mouth in the morning, at noon, in the evening, and at bedtime. 120 tablet 5  ? Apixaban (ELIQUIS PO) Take by mouth.    ? aspirin 81 MG tablet Take 81 mg by mouth daily.    ? atorvastatin (LIPITOR) 80 MG tablet Take 80 mg by mouth at bedtime.     ? carvedilol (COREG) 25 MG tablet Take 25 mg by mouth daily.    ? chlorproMAZINE (THORAZINE) 25 MG tablet Take 1 tablet (25 mg total) by mouth 4 (four) times daily as needed. For hiccups 60 tablet 2  ? dexamethasone (DECADRON) 4 MG tablet Take 71m (5 tabs) one hour prior to injection appointment every 28 days 5 tablet 11  ? dexamethasone (DECADRON) 4 MG tablet Take 5 tablets (20 mg total) by mouth once a week. 20  tablet 5  ? diazepam (VALIUM) 2 MG tablet TAKE ONE TABLET BY MOUTH DAILY AS NEEDED FOR DIZZINESS 30 tablet 5  ? empagliflozin (JARDIANCE) 10 MG TABS tablet Take 10 mg by mouth daily.    ? ergocalciferol (VITAMIN D2) 1.25 MG (50000 UT) capsule Take 1 capsule (50,000 Units total) by mouth once a week. 8 capsule 3  ? gabapentin (NEURONTIN) 300 MG capsule Take 1 capsule (300 mg total) by mouth daily. 30 capsule 6  ? HYDROcodone-acetaminophen (NORCO) 5-325 MG tablet Take 1 tablet by mouth every 8 (eight) hours as needed for moderate pain. 90 tablet 0  ? icosapent Ethyl (VASCEPA) 1 g capsule Take 1 g by mouth 2 (two) times daily.    ? Iron-Vitamin C (VITRON-C) 65-125 MG TABS Take 1 tablet by mouth  daily. 60 tablet 3  ? lidocaine (XYLOCAINE) 2 % solution Use as directed 15 mLs in the mouth or throat as needed for mouth pain. 15 mL 3  ? lidocaine-prilocaine (EMLA) cream Apply 1 application topically as needed. Apply to portacath as needed 30 g 0  ? magnesium oxide (MAG-OX) 400 (241.3 Mg) MG tablet Take 1 tablet (400 mg total) by mouth daily. 30 tablet 0  ? magnesium oxide (MAG-OX) 400 MG tablet Take 1 tablet (400 mg total) by mouth daily. 30 tablet 3  ? meclizine (ANTIVERT) 25 MG tablet Take 1 tablet (25 mg total) by mouth 2 (two) times daily as needed for dizziness. 30 tablet 3  ? megestrol (MEGACE) 400 MG/10ML suspension Take 10 mLs (400 mg total) by mouth 2 (two) times daily. 480 mL 2  ? metoprolol succinate (TOPROL-XL) 25 MG 24 hr tablet Take 25 mg by mouth daily.    ? Multiple Minerals (CALCIUM/MAGNESIUM/ZINC PO) Take 1 tablet by mouth at bedtime.    ? ondansetron (ZOFRAN) 4 MG tablet Take 4 mg by mouth every 8 (eight) hours as needed for nausea.    ? oxyCODONE (OXY IR/ROXICODONE) 5 MG immediate release tablet Take 1 tablet (5 mg total) by mouth every 8 (eight) hours as needed for severe pain. 84 tablet 0  ? pantoprazole (PROTONIX) 40 MG tablet Take 1 tablet (40 mg total) by mouth daily. 30 tablet 6  ? potassium  chloride (KLOR-CON) 10 MEQ tablet Take 1 tablet (10 mEq total) by mouth 2 (two) times daily. 90 tablet 3  ? prochlorperazine (COMPAZINE) 10 MG tablet Take 1 tablet (10 mg total) by mouth every 6 (six) hours as neede

## 2021-07-22 NOTE — Progress Notes (Signed)
Patient has been examined by Dr. Katragadda, and vital signs and labs have been reviewed. ANC, Creatinine, LFTs, hemoglobin, and platelets are within treatment parameters per M.D. - pt may proceed with treatment.    °

## 2021-07-23 LAB — KAPPA/LAMBDA LIGHT CHAINS
Kappa free light chain: 16.5 mg/L (ref 3.3–19.4)
Kappa, lambda light chain ratio: 0.28 (ref 0.26–1.65)
Lambda free light chains: 59.4 mg/L — ABNORMAL HIGH (ref 5.7–26.3)

## 2021-07-24 LAB — PROTEIN ELECTROPHORESIS, SERUM
A/G Ratio: 1.3 (ref 0.7–1.7)
Albumin ELP: 2.7 g/dL — ABNORMAL LOW (ref 2.9–4.4)
Alpha-1-Globulin: 0.2 g/dL (ref 0.0–0.4)
Alpha-2-Globulin: 1.2 g/dL — ABNORMAL HIGH (ref 0.4–1.0)
Beta Globulin: 0.5 g/dL — ABNORMAL LOW (ref 0.7–1.3)
Gamma Globulin: 0.2 g/dL — ABNORMAL LOW (ref 0.4–1.8)
Globulin, Total: 2.1 g/dL — ABNORMAL LOW (ref 2.2–3.9)
M-Spike, %: 0.1 g/dL — ABNORMAL HIGH
Total Protein ELP: 4.8 g/dL — ABNORMAL LOW (ref 6.0–8.5)

## 2021-07-24 LAB — UPEP/UIFE/LIGHT CHAINS/TP, 24-HR UR
% BETA, Urine: 10.1 %
ALPHA 1 URINE: 6.8 %
Albumin, U: 70.9 %
Alpha 2, Urine: 7.8 %
Free Kappa Lt Chains,Ur: 91.73 mg/L — ABNORMAL HIGH (ref 1.17–86.46)
Free Kappa/Lambda Ratio: 0.44 — ABNORMAL LOW (ref 1.83–14.26)
Free Lambda Lt Chains,Ur: 209.61 mg/L — ABNORMAL HIGH (ref 0.27–15.21)
GAMMA GLOBULIN URINE: 4.4 %
M-SPIKE %, Urine: 1 % — ABNORMAL HIGH
M-Spike, Mg/24 Hr: 184 mg/24 hr — ABNORMAL HIGH
Total Protein, Urine-Ur/day: 18390 mg/24 hr — ABNORMAL HIGH (ref 30–150)
Total Protein, Urine: 1313.6 mg/dL
Total Volume: 1400

## 2021-07-27 LAB — IMMUNOFIXATION ELECTROPHORESIS
IgA: 19 mg/dL — ABNORMAL LOW (ref 61–437)
IgG (Immunoglobin G), Serum: 217 mg/dL — ABNORMAL LOW (ref 603–1613)
IgM (Immunoglobulin M), Srm: 56 mg/dL (ref 15–143)
Total Protein ELP: 5.1 g/dL — ABNORMAL LOW (ref 6.0–8.5)

## 2021-08-17 ENCOUNTER — Other Ambulatory Visit (HOSPITAL_COMMUNITY): Payer: Self-pay

## 2021-08-17 MED ORDER — TRAMADOL HCL 50 MG PO TABS: ORAL_TABLET | ORAL | 0 refills | Status: AC

## 2021-08-17 MED ORDER — HYDROCODONE-ACETAMINOPHEN 5-325 MG PO TABS: 1.0000 | ORAL_TABLET | Freq: Three times a day (TID) | ORAL | 0 refills | Status: AC | PRN

## 2021-08-17 MED ORDER — OXYCODONE HCL 5 MG PO TABS: 5.0000 mg | ORAL_TABLET | Freq: Three times a day (TID) | ORAL | 0 refills | Status: AC | PRN

## 2021-08-19 ENCOUNTER — Encounter (HOSPITAL_COMMUNITY): Payer: Self-pay | Admitting: Hematology

## 2021-08-19 ENCOUNTER — Inpatient Hospital Stay (HOSPITAL_COMMUNITY): Payer: Medicare Other

## 2021-08-19 ENCOUNTER — Inpatient Hospital Stay (HOSPITAL_COMMUNITY): Payer: Medicare Other | Attending: Hematology

## 2021-08-19 ENCOUNTER — Other Ambulatory Visit (HOSPITAL_COMMUNITY): Payer: Self-pay | Admitting: Hematology

## 2021-08-19 ENCOUNTER — Inpatient Hospital Stay (HOSPITAL_BASED_OUTPATIENT_CLINIC_OR_DEPARTMENT_OTHER): Payer: Medicare Other | Admitting: Hematology

## 2021-08-19 DIAGNOSIS — Z7952 Long term (current) use of systemic steroids: Secondary | ICD-10-CM | POA: Diagnosis not present

## 2021-08-19 DIAGNOSIS — E8581 Light chain (AL) amyloidosis: Secondary | ICD-10-CM | POA: Diagnosis not present

## 2021-08-19 DIAGNOSIS — I2510 Atherosclerotic heart disease of native coronary artery without angina pectoris: Secondary | ICD-10-CM | POA: Diagnosis not present

## 2021-08-19 DIAGNOSIS — Z79899 Other long term (current) drug therapy: Secondary | ICD-10-CM | POA: Diagnosis not present

## 2021-08-19 DIAGNOSIS — Z7984 Long term (current) use of oral hypoglycemic drugs: Secondary | ICD-10-CM | POA: Diagnosis not present

## 2021-08-19 DIAGNOSIS — Z8673 Personal history of transient ischemic attack (TIA), and cerebral infarction without residual deficits: Secondary | ICD-10-CM | POA: Insufficient documentation

## 2021-08-19 DIAGNOSIS — Z5111 Encounter for antineoplastic chemotherapy: Secondary | ICD-10-CM | POA: Insufficient documentation

## 2021-08-19 DIAGNOSIS — Z5112 Encounter for antineoplastic immunotherapy: Secondary | ICD-10-CM | POA: Insufficient documentation

## 2021-08-19 DIAGNOSIS — Z87891 Personal history of nicotine dependence: Secondary | ICD-10-CM | POA: Insufficient documentation

## 2021-08-19 DIAGNOSIS — Z7982 Long term (current) use of aspirin: Secondary | ICD-10-CM | POA: Insufficient documentation

## 2021-08-19 DIAGNOSIS — K219 Gastro-esophageal reflux disease without esophagitis: Secondary | ICD-10-CM | POA: Insufficient documentation

## 2021-08-19 DIAGNOSIS — I10 Essential (primary) hypertension: Secondary | ICD-10-CM | POA: Insufficient documentation

## 2021-08-19 DIAGNOSIS — I739 Peripheral vascular disease, unspecified: Secondary | ICD-10-CM | POA: Insufficient documentation

## 2021-08-19 DIAGNOSIS — I252 Old myocardial infarction: Secondary | ICD-10-CM | POA: Insufficient documentation

## 2021-08-19 DIAGNOSIS — C9000 Multiple myeloma not having achieved remission: Secondary | ICD-10-CM

## 2021-08-19 DIAGNOSIS — Z7901 Long term (current) use of anticoagulants: Secondary | ICD-10-CM | POA: Insufficient documentation

## 2021-08-19 LAB — CBC WITH DIFFERENTIAL/PLATELET
Abs Immature Granulocytes: 0.1 10*3/uL — ABNORMAL HIGH (ref 0.00–0.07)
Basophils Absolute: 0.1 10*3/uL (ref 0.0–0.1)
Basophils Relative: 1 %
Eosinophils Absolute: 0.7 10*3/uL — ABNORMAL HIGH (ref 0.0–0.5)
Eosinophils Relative: 5 %
HCT: 40.4 % (ref 39.0–52.0)
Hemoglobin: 12.7 g/dL — ABNORMAL LOW (ref 13.0–17.0)
Immature Granulocytes: 1 %
Lymphocytes Relative: 18 %
Lymphs Abs: 2.3 10*3/uL (ref 0.7–4.0)
MCH: 31.4 pg (ref 26.0–34.0)
MCHC: 31.4 g/dL (ref 30.0–36.0)
MCV: 99.8 fL (ref 80.0–100.0)
Monocytes Absolute: 0.9 10*3/uL (ref 0.1–1.0)
Monocytes Relative: 7 %
Neutro Abs: 9.1 10*3/uL — ABNORMAL HIGH (ref 1.7–7.7)
Neutrophils Relative %: 68 %
Platelets: 329 10*3/uL (ref 150–400)
RBC: 4.05 MIL/uL — ABNORMAL LOW (ref 4.22–5.81)
RDW: 15.9 % — ABNORMAL HIGH (ref 11.5–15.5)
WBC: 13.1 10*3/uL — ABNORMAL HIGH (ref 4.0–10.5)
nRBC: 0 % (ref 0.0–0.2)

## 2021-08-19 LAB — COMPREHENSIVE METABOLIC PANEL
ALT: 13 U/L (ref 0–44)
AST: 9 U/L — ABNORMAL LOW (ref 15–41)
Albumin: 2.4 g/dL — ABNORMAL LOW (ref 3.5–5.0)
Alkaline Phosphatase: 91 U/L (ref 38–126)
Anion gap: 9 (ref 5–15)
BUN: 46 mg/dL — ABNORMAL HIGH (ref 8–23)
CO2: 23 mmol/L (ref 22–32)
Calcium: 8.2 mg/dL — ABNORMAL LOW (ref 8.9–10.3)
Chloride: 109 mmol/L (ref 98–111)
Creatinine, Ser: 3.82 mg/dL — ABNORMAL HIGH (ref 0.61–1.24)
GFR, Estimated: 16 mL/min — ABNORMAL LOW (ref >60)
Glucose, Bld: 82 mg/dL (ref 70–99)
Potassium: 4.6 mmol/L (ref 3.5–5.1)
Sodium: 141 mmol/L (ref 135–145)
Total Bilirubin: 0.2 mg/dL — ABNORMAL LOW (ref 0.3–1.2)
Total Protein: 5.3 g/dL — ABNORMAL LOW (ref 6.5–8.1)

## 2021-08-19 LAB — MAGNESIUM: Magnesium: 1.9 mg/dL (ref 1.7–2.4)

## 2021-08-19 LAB — LACTATE DEHYDROGENASE: LDH: 152 U/L (ref 98–192)

## 2021-08-19 MED ORDER — BORTEZOMIB CHEMO SQ INJECTION 3.5 MG (2.5MG/ML)
1.3000 mg/m2 | Freq: Once | INTRAMUSCULAR | Status: AC
  Administered 2021-08-19: 2.75 mg via SUBCUTANEOUS
  Filled 2021-08-19: qty 1.1

## 2021-08-19 MED ORDER — DARATUMUMAB-HYALURONIDASE-FIHJ 1800-30000 MG-UT/15ML ~~LOC~~ SOLN
1800.0000 mg | Freq: Once | SUBCUTANEOUS | Status: AC
  Administered 2021-08-19: 1800 mg via SUBCUTANEOUS
  Filled 2021-08-19: qty 15

## 2021-08-19 MED ORDER — DEXAMETHASONE 4 MG PO TABS
ORAL_TABLET | ORAL | 4 refills | Status: AC
Start: 2021-08-19 — End: ?

## 2021-08-19 NOTE — Progress Notes (Signed)
Treatment given per orders. Patient tolerated it well without problems. Vitals stable and discharged home from clinic ambulatory. Follow up as scheduled.  

## 2021-08-19 NOTE — Patient Instructions (Signed)
[facility]  Discharge Instructions: Thank you for choosing [facility] to provide your oncology and hematology care.  If you have a lab appointment with the Cancer Center, please come in thru the Main Entrance and check in at the main information desk.  Wear comfortable clothing and clothing appropriate for easy access to any Portacath or PICC line.   We strive to give you quality time with your provider. You may need to reschedule your appointment if you arrive late (15 or more minutes).  Arriving late affects you and other patients whose appointments are after yours.  Also, if you miss three or more appointments without notifying the office, you may be dismissed from the clinic at the provider's discretion.      For prescription refill requests, have your pharmacy contact our office and allow 72 hours for refills to be completed.    Today you received the following chemotherapy and/or immunotherapy agents    To help prevent nausea and vomiting after your treatment, we encourage you to take your nausea medication as directed.  BELOW ARE SYMPTOMS THAT SHOULD BE REPORTED IMMEDIATELY: . *FEVER GREATER THAN 100.4 F (38 C) OR HIGHER . *CHILLS OR SWEATING . *NAUSEA AND VOMITING THAT IS NOT CONTROLLED WITH YOUR NAUSEA MEDICATION . *UNUSUAL SHORTNESS OF BREATH . *UNUSUAL BRUISING OR BLEEDING . *URINARY PROBLEMS (pain or burning when urinating, or frequent urination) . *BOWEL PROBLEMS (unusual diarrhea, constipation, pain near the anus) . TENDERNESS IN MOUTH AND THROAT WITH OR WITHOUT PRESENCE OF ULCERS (sore throat, sores in mouth, or a toothache) . UNUSUAL RASH, SWELLING OR PAIN  . UNUSUAL VAGINAL DISCHARGE OR ITCHING   Items with * indicate a potential emergency and should be followed up as soon as possible or go to the Emergency Department if any problems should occur.  Please show the CHEMOTHERAPY ALERT CARD or IMMUNOTHERAPY ALERT CARD at check-in to the  Emergency Department and triage nurse.  Should you have questions after your visit or need to cancel or reschedule your appointment, please contact [facility] [phone]  and follow the prompts.  Office hours are 8:00 a.m. to 4:30 p.m. Monday - Friday. Please note that voicemails left after 4:00 p.m. may not be returned until the following business day.  We are closed weekends and major holidays. You have access to a nurse at all times for urgent questions. Please call the main number to the clinic [phone] and follow the prompts.  For any non-urgent questions, you may also contact your provider using MyChart. We now offer e-Visits for anyone 18 and older to request care online for non-urgent symptoms. For details visit [email or web address].   Also download the MyChart app! Go to the app store, search "MyChart", open the app, select [facility], and log in with your MyChart username and password.  Due to Covid, a mask is required upon entering the hospital/clinic. If you do not have a mask, one will be given to you upon arrival. For doctor visits, patients may have 1 support person aged 18 or older with them. For treatment visits, patients cannot have anyone with them due to current Covid guidelines and our immunocompromised population.  

## 2021-08-19 NOTE — Progress Notes (Signed)
? ?[facility] ?[address] ?[place], [place] [address] ? ? ?CLINIC:  ?Medical Oncology/Hematology ? ?PCP:  ?Pcp, No ?None ?None ? ? 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 ?PRIOR THERAPY: none ? ?NGS Results: not done ? ?CURRENT THERAPY: DaraCyBorD & Aloxi weekly ? ?BRIEF ONCOLOGIC HISTORY:  ?Oncology History  ?Multiple myeloma ([place])  ?03/06/2020 Initial Diagnosis  ? Multiple myeloma ([place]) ? ?  ?03/06/2020 Cancer Staging  ? Staging form: Plasma Cell Myeloma and Plasma Cell Disorders, AJCC 8th Edition ?- Clinical stage from 03/06/2020: RISS Stage II (Beta-2-microglobulin (mg/L): 4.5, Albumin (g/dL): 2.5, ISS: Stage II, High-risk cytogenetics: Absent, LDH: Elevated) - Signed by Derek Jack, MD on 03/06/2020 ? ?  ?03/20/2020 - 03/20/2020 Chemotherapy  ? The patient had dexamethasone (DECADRON) tablet 40 mg, 40 mg, Oral,  Once, 0 of 4 cycles ?bortezomib SQ (VELCADE) chemo injection (2.13m/mL concentration) 2.75 mg, 1.3 mg/m2 = 2.75 mg, Subcutaneous,  Once, 0 of 4 cycles ? ? for chemotherapy treatment.  ? ?  ?03/20/2020 -  Chemotherapy  ? Patient is on Treatment Plan : PRIMARY AMYLOIDOSIS DaraCyBorD (Daratumumab SQ + Cyclophosphamide PO + Bortezomib SQ + Dexamethasone PO/IV) q28d x 6 cycles / Daratumumab SQ q28d  ? ?   ? ? ?CANCER STAGING: ? Cancer Staging  ?[place] ([place] ?Staging form: Plasma Cell Myeloma and Plasma Cell Disorders, AJCC 8th Edition ?- Clinical stage from 03/06/2020: RISS Stage II (Beta-2-microglobulin (mg/L): 4.5, Albumin (g/dL): 2.5, ISS: Stage II, High-risk cytogenetics: Absent, LDH: Elevated) - Signed by KDerek Jack MD on 03/06/2020 ? ? ?INTERVAL HISTORY:  ?Mr. TKymani Shimabukuro a 71y.o. male, returns for routine follow-up and consideration for next cycle of chemotherapy. TBrendonwas last seen on 07/22/2021. ? ?Due for cycle #20 of DaraCyBorD today.  ? ?Overall, he tells me he has been feeling pretty well. He denies falls,  and he is eating well. He reports regular BM, denies diarrhea, and he denies numbness/tingling or cold sensation in his hands and feet. He reports he will occasionally stagger upon standing too quickly. He takes 5 tablets of dexamethasone every Friday. He has gained 5 lbs since his last visit. He denies SOB. He reports trouble sleeping at night, and he sleeps more frequently during the day.   ? ?Overall, he feels ready for next cycle of chemo today.  ? ? ?REVIEW OF SYSTEMS:  ?Review of Systems  ?Constitutional:  Negative for appetite change and fatigue.  ?Respiratory:  Negative for shortness of breath.   ?Gastrointestinal:  Negative for constipation and diarrhea.  ?Neurological:  Negative for numbness.  ?Psychiatric/Behavioral:  Positive for sleep disturbance.   ?All other systems reviewed and are negative. ? ?PAST MEDICAL/SURGICAL HISTORY:  ?Past Medical History:  ?Diagnosis Date  ? CAD (coronary artery disease)   ? GERD (gastroesophageal reflux disease)   ? Hypertension   ? Myocardial infarction ([facility]  ? Peripheral vascular disease ([place]   ? [facility] ([facility]   ? ?Past Surgical History:  ?Procedure Laterality Date  ? BYPASS GRAFT POPLITEAL TO POPLITEAL Right 03/23/2018  ? Procedure: BYPASS GRAFT RIGHT ABOVE KNEE POPLITEAL TO BELOW KNEE POPLITEAL ARTERY  USING RIGHT GREAT SAPHENOUS VEIN;  Surgeon: CMarty Heck MD;  [facility]  Service: Vascular;  Laterality: Right;  ? BYPASS GRAFT POPLITEAL TO POPLITEAL Left 04/09/2019  ? Procedure: BYPASS  ABOVE KNEE POPLITEAL TO BELOW KNEE POPLITEAL AND LIGATION OF LEFT POPITEAL ANEURYSM;  Surgeon: CMarty Heck MD;  [facility];  Service: Vascular;  Laterality: Left;  ? COLONOSCOPY WITH ESOPHAGOGASTRODUODENOSCOPY (EGD)    ? CORONARY ARTERY BYPASS GRAFT    ? EYE SURGERY Bilateral   ? cataracts  ? FEMORAL BYPASS Right 03/23/2018  ? HERNIA REPAIR    ? IR IMAGING GUIDED PORT INSERTION  03/25/2020  ? MASTOIDECTOMY    ? ROTATOR CUFF REPAIR Right   ?  TONSILLECTOMY    ? VEIN HARVEST Right 03/23/2018  ? Procedure: VEIN HARVEST RIGHT GREAT SAPHENOUS;  Surgeon: Marty Heck, MD;  Location: [facility];  Service: Vascular;  Laterality: Right;  ? VEIN HARVEST Left 04/09/2019  ? Procedure: Harvest Greater Saphenous Vein and  Revise Elisa Lateral Saphenous Vein ;  Surgeon: Marty Heck, MD;  Location: [place];  Service: Vascular;  Laterality: Left;  ? ? ?SOCIAL HISTORY:  ?Social History  ? ?Socioeconomic History  ? Marital status: Married  ?  Spouse name: Not on file  ? Number of children: Not on file  ? Years of education: Not on file  ? Highest education level: Not on file  ?Occupational History  ? Not on file  ?Tobacco Use  ? Smoking status: Never  ? Smokeless tobacco: Never  ?Vaping Use  ? Vaping Use: Never used  ?Substance and Sexual Activity  ? Alcohol use: Never  ? Drug use: Never  ? Sexual activity: Not on file  ?Other Topics Concern  ? Not on file  ?Social History Narrative  ? Not on file  ? ?Social Determinants of Health  ? ?Financial Resource Strain: Not on file  ?Food Insecurity: Not on file  ?Transportation Needs: Not on file  ?Physical Activity: Not on file  ?Stress: Not on file  ?Social Connections: Not on file  ?Intimate Partner Violence: Not on file  ? ? ?FAMILY HISTORY:  ?Family History  ?Problem Relation Age of Onset  ? Heart disease Mother   ? ? ?CURRENT MEDICATIONS:  ?Current Outpatient Medications  ?Medication Sig Dispense Refill  ? acetaminophen (TYLENOL) 500 MG tablet Take 500 mg by mouth 2 (two) times daily.    ? acyclovir (ZOVIRAX) 400 MG tablet Take 1 tablet (400 mg total) by mouth 2 (two) times daily. 60 tablet 5  ? ALPRAZolam (XANAX) 0.25 MG tablet Take 1 tablet (0.25 mg total) by mouth in the morning, at noon, in the evening, and at bedtime. 120 tablet 5  ? Apixaban (ELIQUIS PO) Take by mouth.    ? aspirin 81 MG tablet Take 81 mg by mouth daily.    ? atorvastatin (LIPITOR) 80 MG tablet Take 80 mg by mouth at bedtime.     ? carvedilol  (COREG) 25 MG tablet Take 25 mg by mouth daily.    ? chlorproMAZINE (THORAZINE) 25 MG tablet Take 1 tablet (25 mg total) by mouth 4 (four) times daily as needed. For hiccups 60 tablet 2  ? dexamethasone (DECADRON) 4 MG tablet Take 77m (5 tabs) one hour prior to injection appointment every 28 days 5 tablet 11  ? dexamethasone (DECADRON) 4 MG tablet Take 5 tablets (20 mg total) by mouth once a week. 20 tablet 5  ? diazepam (VALIUM) 2 MG tablet TAKE ONE TABLET BY MOUTH DAILY AS NEEDED FOR DIZZINESS 30 tablet 5  ? empagliflozin (JARDIANCE) 10 MG TABS tablet Take 10 mg by mouth daily.    ? ergocalciferol (VITAMIN D2) 1.25 MG (50000 UT) capsule Take 1 capsule (50,000 Units total) by mouth once a week. 8 capsule 3  ?  gabapentin (NEURONTIN) 300 MG capsule Take 1 capsule (300 mg total) by mouth daily. 30 capsule 6  ? HYDROcodone-acetaminophen (NORCO) 5-325 MG tablet Take 1 tablet by mouth every 8 (eight) hours as needed for moderate pain. 90 tablet 0  ? icosapent Ethyl (VASCEPA) 1 g capsule Take 1 g by mouth 2 (two) times daily.    ? lidocaine (XYLOCAINE) 2 % solution Use as directed 15 mLs in the mouth or throat as needed for mouth pain. 15 mL 3  ? lidocaine-prilocaine (EMLA) cream Apply 1 application topically as needed. Apply to portacath as needed 30 g 0  ? magnesium oxide (MAG-OX) 400 (241.3 Mg) MG tablet Take 1 tablet (400 mg total) by mouth daily. 30 tablet 0  ? magnesium oxide (MAG-OX) 400 MG tablet Take 1 tablet (400 mg total) by mouth daily. 30 tablet 3  ? meclizine (ANTIVERT) 25 MG tablet Take 1 tablet (25 mg total) by mouth 2 (two) times daily as needed for dizziness. 30 tablet 3  ? megestrol (MEGACE) 400 MG/10ML suspension Take 10 mLs (400 mg total) by mouth 2 (two) times daily. 480 mL 2  ? metoprolol succinate (TOPROL-XL) 25 MG 24 hr tablet Take 25 mg by mouth daily.    ? Multiple Minerals (CALCIUM/MAGNESIUM/ZINC PO) Take 1 tablet by mouth at bedtime.    ? ondansetron (ZOFRAN) 4 MG tablet Take 4 mg by mouth  every 8 (eight) hours as needed for nausea.    ? oxyCODONE (OXY IR/ROXICODONE) 5 MG immediate release tablet Take 1 tablet (5 mg total) by mouth every 8 (eight) hours as needed for severe pain. 84 tablet 0  ? pant

## 2021-08-19 NOTE — Patient Instructions (Addendum)
[facility] at [facility] ?Discharge Instructions ? ? ?You were seen and examined today by Dr. Delton Coombes. ? ?He reviewed the results of your 24 hour urine test. We will need to change treatment. Dr. Raliegh Ip has a plan in mind using two different pills. He has also reached out to Dr. Aris Lot at [facility] to consult on the best plan of care going forward.  ? ?We will stop the Darzalex injections at this point and switch to treatment with specialty pills.  ? ? ? ? ? ? ?Thank you for choosing [place] at [facility] to provide your oncology and hematology care.  To afford each patient quality time with our provider, please arrive at least 15 minutes before your scheduled appointment time.  ? ?If you have a lab appointment with the [facility] please come in thru the Main Entrance and check in at the main information desk. ? ?You need to re-schedule your appointment should you arrive 10 or more minutes late.  We strive to give you quality time with our providers, and arriving late affects you and other patients whose appointments are after yours.  Also, if you no show three or more times for appointments you may be dismissed from the clinic at the providers discretion.     ?Again, thank you for choosing [facility].  Our hope is that these requests will decrease the amount of time that you wait before being seen by our physicians.       ?_____________________________________________________________ ? ?Should you have questions after your visit to [facility], please contact our office at [phone] and follow the prompts.  Our office hours are 8:00 a.m. and 4:30 p.m. Monday - Friday.  Please note that voicemails left after 4:00 p.m. may not be returned until the following business day.  We are closed weekends and major holidays.  You do have access to a nurse 24-7, just call the main number to the clinic [phone] and do not press any  options, hold on the line and a nurse will answer the phone.   ? ?For prescription refill requests, have your pharmacy contact our office and allow 72 hours.   ? ?Due to Covid, you will need to wear a mask upon entering the hospital. If you do not have a mask, a mask will be given to you at the Main Entrance upon arrival. For doctor visits, patients may have 1 support person age 11 or older with them. For treatment visits, patients can not have anyone with them due to social distancing guidelines and our immunocompromised population.  ? ?   ?

## 2021-08-19 NOTE — Progress Notes (Signed)
Patient presents today for Velcade and Daratumumab injections per providers order.  Vital signs and labs reviewed by the MD.  Creatinine noted to be  3.82.  Message received from Anastasio Champion RN/Dr. Delton Coombes patient okay for treatment. ? ?Patient took premedications and dexamethasone prior to appointment. ?

## 2021-08-19 NOTE — Progress Notes (Signed)
Patient has been examined by Dr. Katragadda, and vital signs and labs have been reviewed. ANC, Creatinine, LFTs, hemoglobin, and platelets are within treatment parameters per M.D. - pt may proceed with treatment.    °

## 2021-08-20 LAB — KAPPA/LAMBDA LIGHT CHAINS
Kappa free light chain: 12 mg/L (ref 3.3–19.4)
Kappa, lambda light chain ratio: 0.36 (ref 0.26–1.65)
Lambda free light chains: 33.3 mg/L — ABNORMAL HIGH (ref 5.7–26.3)

## 2021-08-21 LAB — PROTEIN ELECTROPHORESIS, SERUM
A/G Ratio: 1 (ref 0.7–1.7)
Albumin ELP: 2.3 g/dL — ABNORMAL LOW (ref 2.9–4.4)
Alpha-1-Globulin: 0.2 g/dL (ref 0.0–0.4)
Alpha-2-Globulin: 1.2 g/dL — ABNORMAL HIGH (ref 0.4–1.0)
Beta Globulin: 0.6 g/dL — ABNORMAL LOW (ref 0.7–1.3)
Gamma Globulin: 0.2 g/dL — ABNORMAL LOW (ref 0.4–1.8)
Globulin, Total: 2.2 g/dL (ref 2.2–3.9)
M-Spike, %: 0.1 g/dL — ABNORMAL HIGH
Total Protein ELP: 4.5 g/dL — ABNORMAL LOW (ref 6.0–8.5)

## 2021-08-24 LAB — IMMUNOFIXATION ELECTROPHORESIS
IgA: 14 mg/dL — ABNORMAL LOW (ref 61–437)
IgG (Immunoglobin G), Serum: 167 mg/dL — ABNORMAL LOW (ref 603–1613)
IgM (Immunoglobulin M), Srm: 41 mg/dL (ref 15–143)
Total Protein ELP: 4.6 g/dL — ABNORMAL LOW (ref 6.0–8.5)

## 2021-09-04 ENCOUNTER — Emergency Department (HOSPITAL_COMMUNITY): Payer: Medicare Other

## 2021-09-04 ENCOUNTER — Emergency Department (HOSPITAL_COMMUNITY)
Admission: EM | Admit: 2021-09-04 | Discharge: 2021-09-05 | Disposition: A | Discharge status: Home / Self Care | Payer: Medicare Other | Attending: Emergency Medicine | Admitting: Emergency Medicine

## 2021-09-04 ENCOUNTER — Encounter (HOSPITAL_COMMUNITY): Payer: Self-pay | Admitting: Emergency Medicine

## 2021-09-04 DIAGNOSIS — R519 Headache, unspecified: Secondary | ICD-10-CM | POA: Diagnosis not present

## 2021-09-04 DIAGNOSIS — Z7982 Long term (current) use of aspirin: Secondary | ICD-10-CM | POA: Insufficient documentation

## 2021-09-04 DIAGNOSIS — Z951 Presence of aortocoronary bypass graft: Secondary | ICD-10-CM | POA: Insufficient documentation

## 2021-09-04 DIAGNOSIS — N189 Chronic kidney disease, unspecified: Secondary | ICD-10-CM | POA: Diagnosis not present

## 2021-09-04 DIAGNOSIS — Z7901 Long term (current) use of anticoagulants: Secondary | ICD-10-CM | POA: Diagnosis not present

## 2021-09-04 DIAGNOSIS — Z79899 Other long term (current) drug therapy: Secondary | ICD-10-CM | POA: Diagnosis not present

## 2021-09-04 DIAGNOSIS — I2510 Atherosclerotic heart disease of native coronary artery without angina pectoris: Secondary | ICD-10-CM | POA: Diagnosis not present

## 2021-09-04 DIAGNOSIS — I129 Hypertensive chronic kidney disease with stage 1 through stage 4 chronic kidney disease, or unspecified chronic kidney disease: Secondary | ICD-10-CM | POA: Insufficient documentation

## 2021-09-04 DIAGNOSIS — R112 Nausea with vomiting, unspecified: Secondary | ICD-10-CM | POA: Diagnosis present

## 2021-09-04 LAB — BASIC METABOLIC PANEL
Anion gap: 9 (ref 5–15)
BUN: 52 mg/dL — ABNORMAL HIGH (ref 8–23)
CO2: 22 mmol/L (ref 22–32)
Calcium: 8.8 mg/dL — ABNORMAL LOW (ref 8.9–10.3)
Chloride: 105 mmol/L (ref 98–111)
Creatinine, Ser: 4.89 mg/dL — ABNORMAL HIGH (ref 0.61–1.24)
GFR, Estimated: 12 mL/min — ABNORMAL LOW (ref >60)
Glucose, Bld: 182 mg/dL — ABNORMAL HIGH (ref 70–99)
Potassium: 4.5 mmol/L (ref 3.5–5.1)
Sodium: 136 mmol/L (ref 135–145)

## 2021-09-04 LAB — CBC
HCT: 45.3 % (ref 39.0–52.0)
Hemoglobin: 14.3 g/dL (ref 13.0–17.0)
MCH: 31.2 pg (ref 26.0–34.0)
MCHC: 31.6 g/dL (ref 30.0–36.0)
MCV: 98.9 fL (ref 80.0–100.0)
Platelets: 419 10*3/uL — ABNORMAL HIGH (ref 150–400)
RBC: 4.58 MIL/uL (ref 4.22–5.81)
RDW: 15.9 % — ABNORMAL HIGH (ref 11.5–15.5)
WBC: 22.7 10*3/uL — ABNORMAL HIGH (ref 4.0–10.5)
nRBC: 0 % (ref 0.0–0.2)

## 2021-09-04 MED ORDER — FENTANYL CITRATE PF 50 MCG/ML IJ SOSY
50.0000 ug | PREFILLED_SYRINGE | Freq: Once | INTRAMUSCULAR | Status: AC
  Administered 2021-09-04: 50 ug via INTRAVENOUS
  Filled 2021-09-04: qty 1

## 2021-09-04 MED ORDER — METOCLOPRAMIDE HCL 5 MG/ML IJ SOLN
10.0000 mg | Freq: Once | INTRAMUSCULAR | Status: AC
  Administered 2021-09-04: 10 mg via INTRAVENOUS
  Filled 2021-09-04: qty 2

## 2021-09-04 MED ORDER — PROCHLORPERAZINE EDISYLATE 10 MG/2ML IJ SOLN
10.0000 mg | Freq: Once | INTRAMUSCULAR | Status: AC
  Administered 2021-09-04: 10 mg via INTRAVENOUS
  Filled 2021-09-04: qty 2

## 2021-09-04 MED ORDER — ONDANSETRON HCL 4 MG/2ML IJ SOLN
4.0000 mg | Freq: Once | INTRAMUSCULAR | Status: AC
  Administered 2021-09-04: 4 mg via INTRAVENOUS
  Filled 2021-09-04: qty 2

## 2021-09-04 NOTE — ED Notes (Signed)
Pt nauseated and a little SOB after ambulating from triage to room  O2 97%

## 2021-09-04 NOTE — ED Triage Notes (Addendum)
Pt c/o severe vomiting since about 3pm today. Pt is currently receiving chemo treatments. Pt also c/o headache that started prior to the vomiting.

## 2021-09-04 NOTE — ED Provider Notes (Signed)
[facility] EMERGENCY DEPARTMENT Provider Note   CSN: [id number] Arrival date & time: 09/04/21  1901     History  Chief Complaint  Patient presents with   Emesis    Keri Tavella is a 71 y.o. male.   Emesis Associated symptoms: headaches   Associated symptoms: no abdominal pain   Patient presents with nausea vomiting headache.  Began around 3:00 today.  Headache is dull in the back of his head.  No photophobia.  No trauma.  Has a history of multiple myeloma.  And is on chemotherapy.  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  No fevers.  Will occasionally get some headaches but states usually do not last this long.  Does take Decadron on Fridays but states he has not taken it today because he was feeling bad.   Past Medical History:  Diagnosis Date   CAD (coronary artery disease)    GERD (gastroesophageal reflux disease)    Hypertension    Myocardial infarction [facility]) 1994   Peripheral vascular disease ([place])    Stroke [facility])    Past Surgical History:  Procedure Laterality Date   BYPASS GRAFT POPLITEAL TO POPLITEAL Right 03/23/2018   Procedure: BYPASS GRAFT RIGHT ABOVE KNEE POPLITEAL TO BELOW KNEE POPLITEAL ARTERY  USING RIGHT GREAT SAPHENOUS VEIN;  Surgeon: Marty Heck, MD;  Location: [place];  Service: Vascular;  Laterality: Right;   BYPASS GRAFT POPLITEAL TO POPLITEAL Left 04/09/2019   Procedure: BYPASS  ABOVE KNEE POPLITEAL TO BELOW KNEE POPLITEAL AND LIGATION OF LEFT POPITEAL ANEURYSM;  Surgeon: Marty Heck, MD;  Location: [facility] OR;  Service: Vascular;  Laterality: Left;   COLONOSCOPY WITH ESOPHAGOGASTRODUODENOSCOPY (EGD)     CORONARY ARTERY BYPASS GRAFT     EYE SURGERY Bilateral    cataracts   FEMORAL BYPASS Right 03/23/2018   HERNIA REPAIR     IR IMAGING GUIDED PORT INSERTION  03/25/2020   MASTOIDECTOMY     ROTATOR CUFF REPAIR Right    TONSILLECTOMY     VEIN HARVEST Right 03/23/2018   Procedure: VEIN HARVEST RIGHT GREAT SAPHENOUS;   Surgeon: Marty Heck, MD;  Location[facility] OR;  Service: Vascular;  Laterality: Right;   VEIN HARVEST Left 04/09/2019   Procedure: Harvest Greater Saphenous Vein and  Revise Elisa Lateral Saphenous Vein ;  Surgeon: Marty Heck, MD;  Location: [place];  Service: Vascular;  Laterality: Left;     Home Medications Prior to Admission medications   Medication Sig Start Date End Date Taking? Authorizing Provider  acetaminophen (TYLENOL) 500 MG tablet Take 500 mg by mouth 2 (two) times daily. 10/08/18   [provider]  acyclovir (ZOVIRAX) 400 MG tablet Take 1 tablet (400 mg total) by mouth 2 (two) times daily. 02/18/21   Derek Jack, MD  ALPRAZolam Duanne Moron) 0.25 MG tablet Take 1 tablet (0.25 mg total) by mouth in the morning, at noon, in the evening, and at bedtime. 06/29/21   Derek Jack, MD  Apixaban (ELIQUIS PO) Take by mouth.    [provider]  aspirin 81 MG tablet Take 81 mg by mouth daily.    [provider]  atorvastatin (LIPITOR) 80 MG tablet Take 80 mg by mouth at bedtime.  02/06/18   [provider]  carvedilol (COREG) 25 MG tablet Take 25 mg by mouth daily.    [provider]  chlorproMAZINE (THORAZINE) 25 MG tablet Take 1 tablet (25 mg total) by mouth 4 (four) times daily as needed. For  hiccups 04/24/20   Derek Jack, MD  dexamethasone (DECADRON) 4 MG tablet Take 5 tablets (20 mg total) by mouth once a week. 07/22/21   Derek Jack, MD  dexamethasone (DECADRON) 4 MG tablet Take 19m (5 tabs) one hour prior to injection appointment every 28 days 08/19/21   KDerek Jack MD  diazepam (VALIUM) 2 MG tablet TAKE ONE TABLET BY MOUTH DAILY AS NEEDED FOR DIZZINESS 05/25/21   KDerek Jack MD  empagliflozin (JARDIANCE) 10 MG TABS tablet Take 10 mg by mouth daily.    [provider]  ergocalciferol (VITAMIN D2) 1.25 MG (50000 UT) capsule Take 1 capsule (50,000 Units total) by mouth once a week.  06/12/20   KDerek Jack MD  gabapentin (NEURONTIN) 300 MG capsule Take 1 capsule (300 mg total) by mouth daily. 08/07/20   KDerek Jack MD  HYDROcodone-acetaminophen (NORCO) 5-325 MG tablet Take 1 tablet by mouth every 8 (eight) hours as needed for moderate pain. 08/17/21   KDerek Jack MD  icosapent Ethyl (VASCEPA) 1 g capsule Take 1 g by mouth 2 (two) times daily.    [provider]  lidocaine (XYLOCAINE) 2 % solution Use as directed 15 mLs in the mouth or throat as needed for mouth pain. 05/09/20   KDerek Jack MD  lidocaine-prilocaine (EMLA) cream Apply 1 application topically as needed. Apply to portacath as needed 04/03/20   KDerek Jack MD  magnesium oxide (MAG-OX) 400 MG tablet Take 1 tablet (400 mg total) by mouth daily. 06/29/21   KDerek Jack MD  meclizine (ANTIVERT) 25 MG tablet Take 1 tablet (25 mg total) by mouth 2 (two) times daily as needed for dizziness. 04/03/20   KDerek Jack MD  megestrol (MEGACE) 400 MG/10ML suspension Take 10 mLs (400 mg total) by mouth 2 (two) times daily. 05/01/20   KDerek Jack MD  metoprolol succinate (TOPROL-XL) 25 MG 24 hr tablet Take 25 mg by mouth daily. 11/30/20   [provider]  Multiple Minerals (CALCIUM/MAGNESIUM/ZINC PO) Take 1 tablet by mouth at bedtime.    [provider]  ondansetron (ZOFRAN) 4 MG tablet Take 4 mg by mouth every 8 (eight) hours as needed for nausea. 03/11/20   [provider]  oxyCODONE (OXY IR/ROXICODONE) 5 MG immediate release tablet Take 1 tablet (5 mg total) by mouth every 8 (eight) hours as needed for severe pain. 08/17/21   KDerek Jack MD  pantoprazole (PROTONIX) 40 MG tablet Take 1 tablet (40 mg total) by mouth daily. 06/29/21   KDerek Jack MD  potassium chloride (KLOR-CON) 10 MEQ tablet Take 1 tablet (10 mEq total) by mouth 2 (two) times daily. 07/15/20   KDerek Jack MD  prochlorperazine (COMPAZINE)  10 MG tablet Take 1 tablet (10 mg total) by mouth every 6 (six) hours as needed for nausea or vomiting. 09/04/20   KDerek Jack MD  sacubitril-valsartan (ENTRESTO) 24-26 MG Take 1 tablet by mouth 2 (two) times daily.    [provider]  scopolamine (TRANSDERM-SCOP) 1 MG/3DAYS     [provider]  sildenafil (REVATIO) 20 MG tablet Take 20 mg by mouth daily. 12/03/19   [provider]  traMADol (ULTRAM) 50 MG tablet TAKE ONE TABLET BY MOUTH EVERY 8 HOURS AS NEEDED FOR SEVERE PAIN 08/17/21   KDerek Jack MD  vitamin B-12 (CYANOCOBALAMIN) 1000 MCG tablet Take 1,000 mcg by mouth daily.    [provider]  VITRON-C 65-125 MG TABS TAKE ONE TABLET BY MOUTH DAILY 08/19/21   KDerek Jack MD  zinc  gluconate 50 MG tablet Take 50 mg by mouth daily.     [provider]      Allergies    Vancomycin    Review of Systems   Review of Systems  Constitutional:  Positive for appetite change.  Cardiovascular:  Negative for chest pain.  Gastrointestinal:  Positive for nausea and vomiting. Negative for abdominal pain.  Musculoskeletal:  Negative for back pain.  Neurological:  Positive for headaches.  Psychiatric/Behavioral:  Negative for confusion.    Physical Exam Updated Vital Signs BP (!) 167/100   Pulse 71   Temp (!) 97.5 F (36.4 C) (Oral)   Resp 18   Ht _0  (1.702 m)   Wt 87.1 kg   SpO2 96%   BMI 30.07 kg/m  Physical Exam Vitals and nursing note reviewed.  HENT:     Head: Atraumatic.  Eyes:     Extraocular Movements: Extraocular movements intact.  Cardiovascular:     Rate and Rhythm: Regular rhythm.  Abdominal:     Tenderness: There is no abdominal tenderness.  Musculoskeletal:        General: No tenderness.     Cervical back: Neck supple. No rigidity or tenderness.  Skin:    Capillary Refill: Capillary refill takes less than 2 seconds.  Neurological:     Mental Status: He is alert and oriented to person, place, and  time.    ED Results / Procedures / Treatments   Labs (all labs ordered are listed, but only abnormal results are displayed) Labs Reviewed  BASIC METABOLIC PANEL - Abnormal; Notable for the following components:      Result Value   Glucose, Bld 182 (*)    BUN 52 (*)    Creatinine, Ser 4.89 (*)    Calcium 8.8 (*)    GFR, Estimated 12 (*)    All other components within normal limits  CBC - Abnormal; Notable for the following components:   WBC 22.7 (*)    RDW 15.9 (*)    Platelets 419 (*)    All other components within normal limits    EKG None  Radiology CT Head Wo Contrast  Result Date: 09/04/2021 CLINICAL DATA:  Headache, new or worsening (Age >= 50y) Technologist notes state: Patient reports vomiting. Active chemotherapy. EXAM: CT HEAD WITHOUT CONTRAST TECHNIQUE: Contiguous axial images were obtained from the base of the skull through the vertex without intravenous contrast. RADIATION DOSE REDUCTION: This exam was performed according to the departmental dose-optimization program which includes automated exposure control, adjustment of the mA and/or kV according to patient size and/or use of iterative reconstruction technique. COMPARISON:  Brain MRI 05/01/2021 FINDINGS: Brain: Brain volume is normal for age. No intracranial hemorrhage, mass effect, or midline shift. No hydrocephalus. The basilar cisterns are patent. No evidence of territorial infarct or acute ischemia. No extra-axial or intracranial fluid collection. Vascular: Atherosclerosis of skullbase vasculature without hyperdense vessel or abnormal calcification. Skull: No fracture or focal lesion. Sinuses/Orbits: Left mastoidectomy. Paranasal sinuses are clear bilateral cataract resection Other: None. IMPRESSION: No acute intracranial abnormality. Electronically Signed   By: Keith Rake M.D.   On: 09/04/2021 21:42    Procedures Procedures    Medications Ordered in ED Medications  prochlorperazine (COMPAZINE) injection 10  mg (10 mg Intravenous Given 09/04/21 2026)  fentaNYL (SUBLIMAZE) injection 50 mcg (50 mcg Intravenous Given 09/04/21 2033)  ondansetron (ZOFRAN) injection 4 mg (4 mg Intravenous Given 09/04/21 2155)  metoCLOPramide (REGLAN) injection 10 mg (10 mg Intravenous Given 09/04/21 2255)  ED Course/ Medical Decision Making/ A&P                           Medical Decision Making Amount and/or Complexity of Data Reviewed Labs: ordered. Radiology: ordered.  Risk Prescription drug management.   Patient is headache.  Began earlier today.  Nausea and vomiting also.  Is on chemotherapy for multiple myeloma.  Initially stated he did not have his Decadron that he takes on Friday today but with further questioning it sounds as if he has taken it.  Does have a hypertension.  Has had nausea vomiting.  Has been given Compazine and then some fentanyl and then some Reglan with some continued nausea and continued headache.  Lab work reassuring.  White count is elevated at 22 and was recently 14, however did have his steroids today.  Also creatinine has worsened from 4.5-4.9.  Discussed with patient about admission to the hospital.  He states he would much rather go home.  I think overall with short-term follow-up is reasonable to go home.  Can always return.  Has short-term follow-up with oncology and his nephrologist.  Doubt meningitis.  Also not a good candidate for lumbar puncture being on Eliquis.  Blood pressure mildly elevated but I think more likely reactive as opposed to the cause.  Will discharge home        Final Clinical Impression(s) / ED Diagnoses Final diagnoses:  Acute nonintractable headache, unspecified headache type  Nausea and vomiting, unspecified vomiting type    Rx / DC Orders ED Discharge Orders     None         Davonna Belling, MD 09/04/21 2349

## 2021-09-05 NOTE — ED Notes (Signed)
Patient verbalizes understanding of discharge instructions. Opportunity for questioning and answers were provided. Armband removed by staff, pt discharged from ED. Wheeled out to lobby with family  

## 2021-09-07 ENCOUNTER — Other Ambulatory Visit (HOSPITAL_COMMUNITY): Payer: Self-pay

## 2021-09-09 ENCOUNTER — Encounter (HOSPITAL_COMMUNITY): Payer: Self-pay | Admitting: Hematology

## 2021-09-16 ENCOUNTER — Inpatient Hospital Stay (HOSPITAL_COMMUNITY): Payer: Medicare Other

## 2021-09-16 ENCOUNTER — Inpatient Hospital Stay (HOSPITAL_COMMUNITY): Payer: Medicare Other | Admitting: Hematology

## 2021-10-03 DEATH — deceased

## 2021-10-14 ENCOUNTER — Other Ambulatory Visit (HOSPITAL_COMMUNITY): Payer: Medicare Other

## 2021-10-14 ENCOUNTER — Ambulatory Visit (HOSPITAL_COMMUNITY): Payer: Medicare Other

## 2021-10-14 ENCOUNTER — Ambulatory Visit (HOSPITAL_COMMUNITY): Payer: Medicare Other | Admitting: Hematology

## 2021-11-11 ENCOUNTER — Other Ambulatory Visit (HOSPITAL_COMMUNITY): Payer: Medicare Other

## 2021-11-11 ENCOUNTER — Ambulatory Visit (HOSPITAL_COMMUNITY): Payer: Medicare Other | Admitting: Hematology

## 2021-11-11 ENCOUNTER — Ambulatory Visit (HOSPITAL_COMMUNITY): Payer: Medicare Other

## 2022-07-22 IMAGING — PT NM PET TUM IMG INITIAL (PI) WHOLE BODY
3 series · 22 of 25 positions shown · non-contrast
Comparison: CT scan 03/10/2018

CLINICAL DATA: Initial treatment strategy for renal amyloidosis.

EXAM:
NUCLEAR MEDICINE PET WHOLE BODY
TECHNIQUE: 13.3 mCi F-18 FDG was injected intravenously. Full-ring PET imaging
was performed from the head to foot after the radiotracer. CT data
was obtained and used for attenuation correction and anatomic
localization.
Fasting blood glucose: 93 mg/dl

[mip · 3 of 48 slices shown]
[im 1/48]
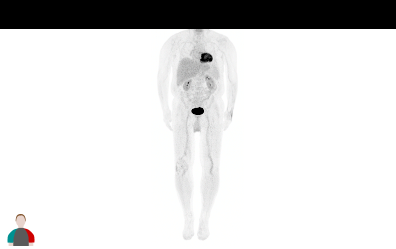
[im 24/48]
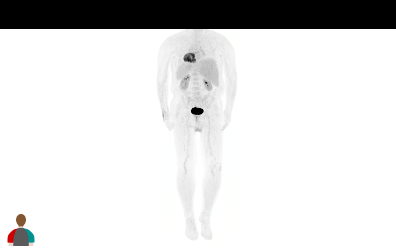
[im 48/48]
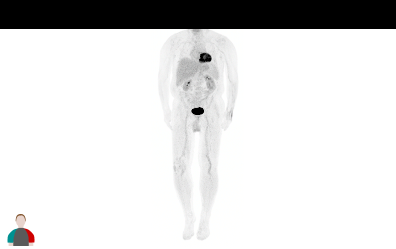

[axial ct wb fusion · 16 of 333 slices shown]
[im 1/333]
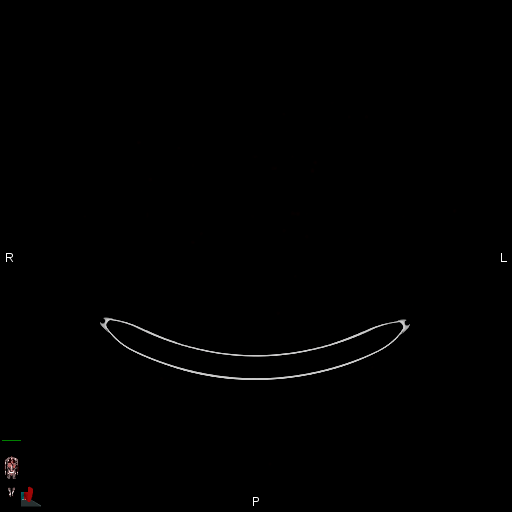
[im 37/333]
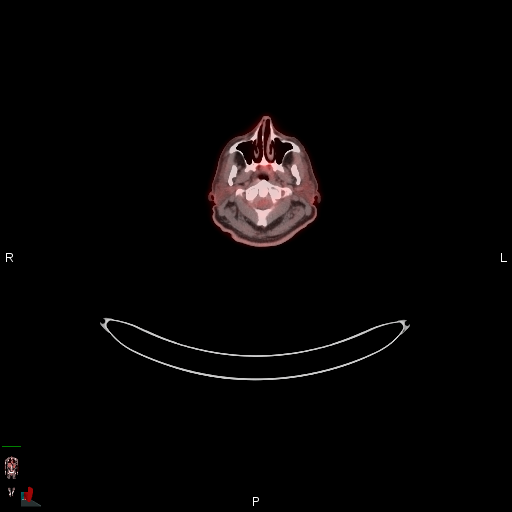
[im 56/333]
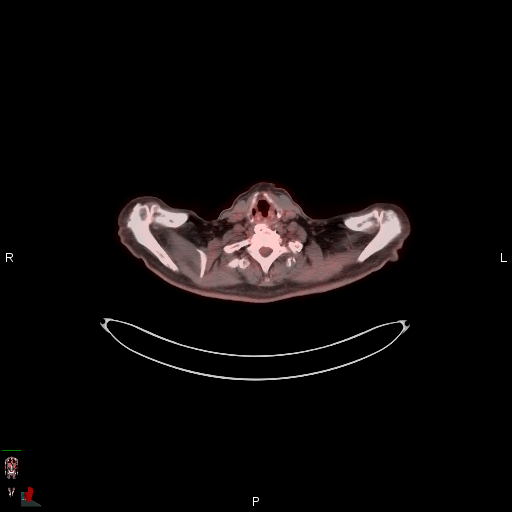
[im 74/333]
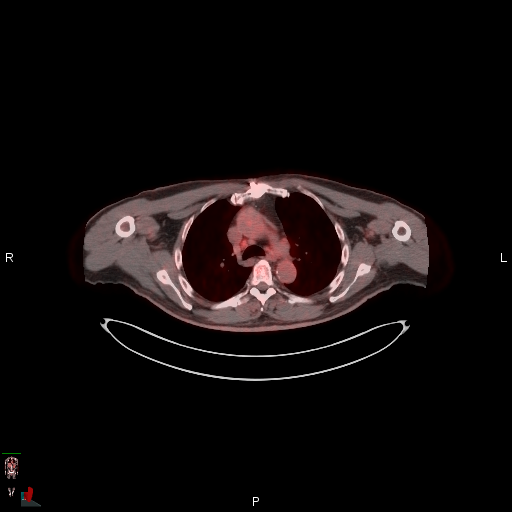
[im 93/333]
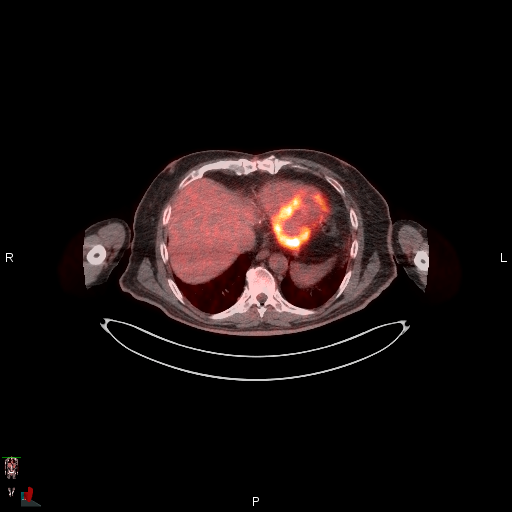
[im 111/333]
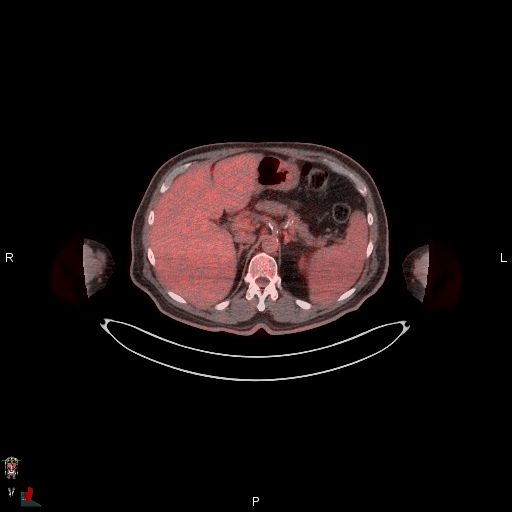
[im 130/333]
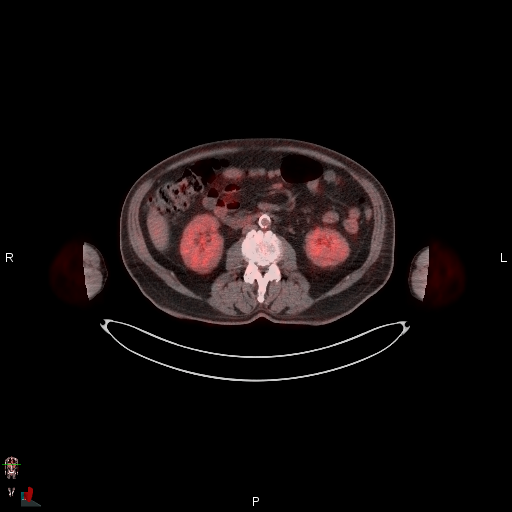
[im 148/333]
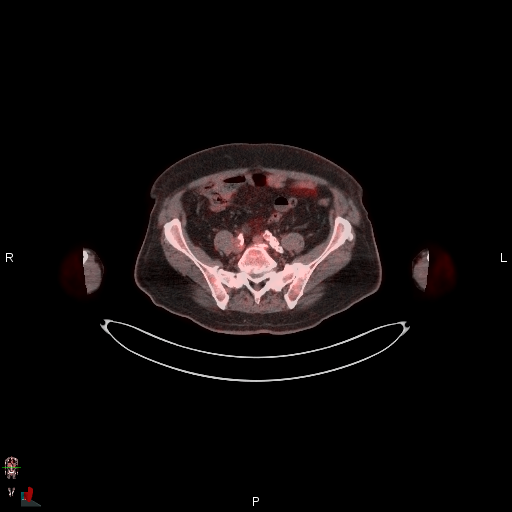
[im 185/333]
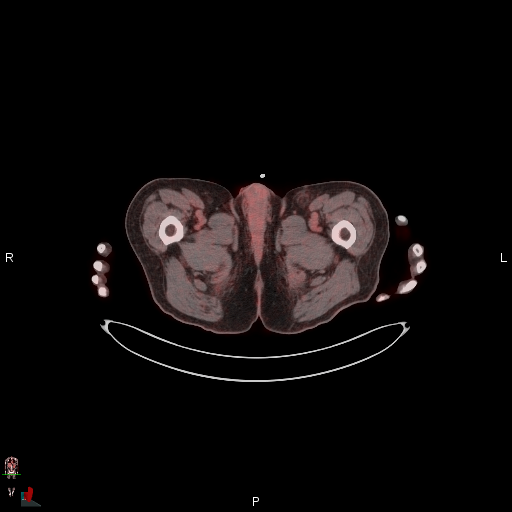
[im 203/333]
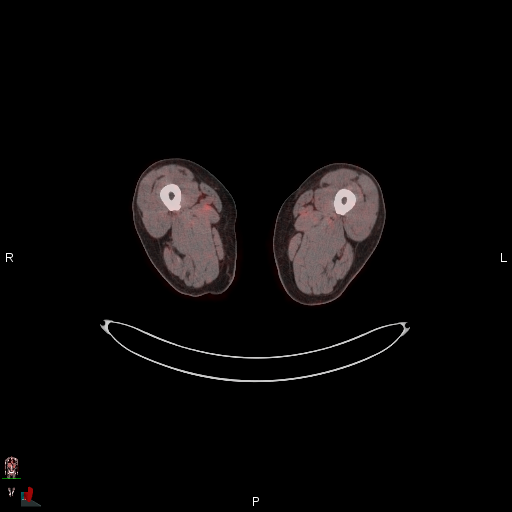
[im 222/333]
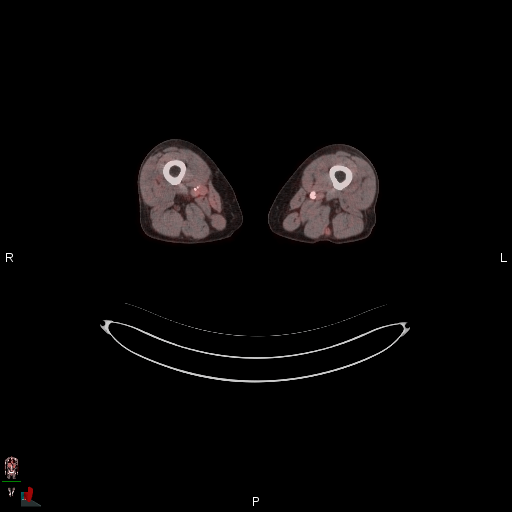
[im 240/333]
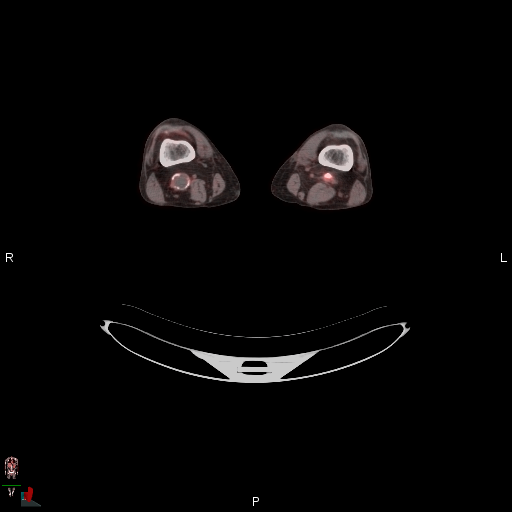
[im 259/333]
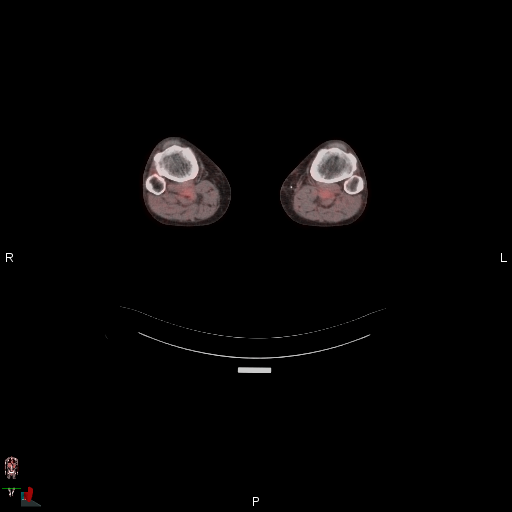
[im 277/333]
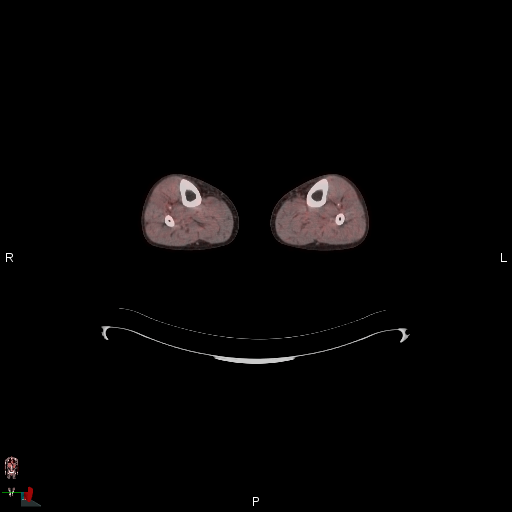
[im 296/333]
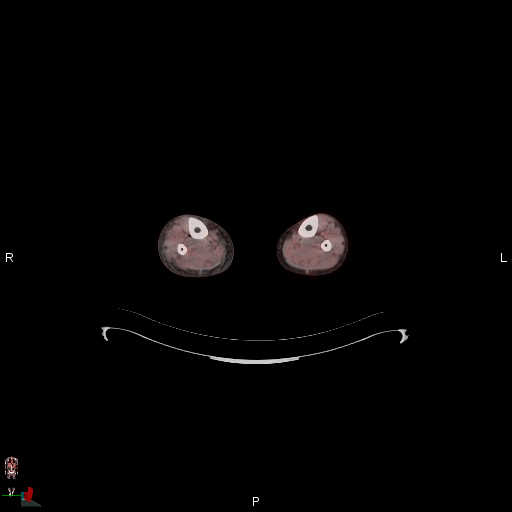
[im 333/333]
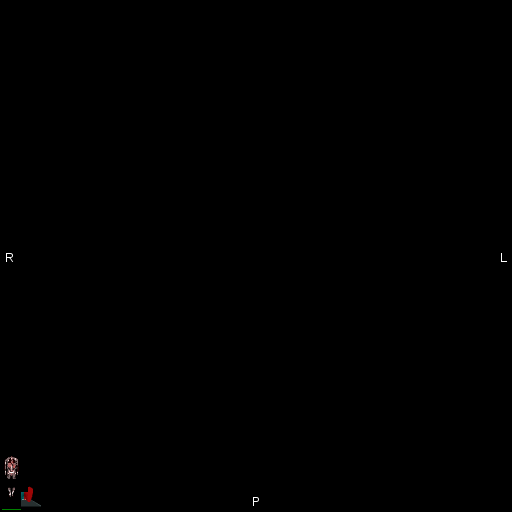

[coronal ct wb fusion · 3 of 62 slices shown]
[im 1/62]
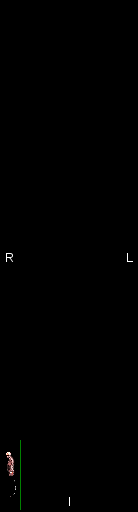
[im 31/62]
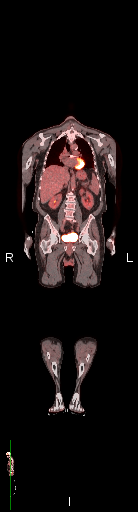
[im 62/62]
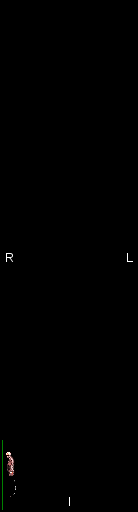

[22 of 25 positions shown; findings below may reference images not displayed]

FINDINGS: Mediastinal blood pool activity: SUV max

HEAD/NECK: No hypermetabolic activity in the scalp. No
hypermetabolic cervical lymph nodes.

Incidental CT findings: none

CHEST: Small scattered mediastinal and hilar lymph nodes are mildly
hypermetabolic. These are more likely inflammatory/reactive. Most of
the nodes measure less than 8 mm. SUV max is 4.0.

No worrisome pulmonary lesions. No chest wall mass, supraclavicular
or axillary adenopathy.

Incidental CT findings: Surgical changes from bypass surgery. Aortic
and coronary artery calcifications are noted.

ABDOMEN/PELVIS: No abnormal hypermetabolic activity within the
liver, pancreas, adrenal glands, or spleen. No hypermetabolic lymph
nodes in the abdomen or pelvis.

Incidental CT findings: Severe/advanced atherosclerotic
calcifications involving the aorta, iliac arteries and branch
vessels. No focal aneurysm. Borderline splenomegaly but no
hypermetabolism or lesions.

SKELETON: No significant osseous findings.

Incidental CT findings: none

EXTREMITIES: No abnormal hypermetabolic activity in the lower
extremities.

Incidental CT findings: 3.3 cm rim calcified popliteal artery
aneurysm. This is unchanged since 7127.
IMPRESSION: 1. Scattered borderline mediastinal and hilar lymph nodes with low
level hypermetabolism. This is most likely inflammatory/reactive. A
follow-up chest CT in 4-6 months may be helpful to reassess.
2. No significant findings in the abdomen/pelvis.
3. Advanced vascular disease. Stable 3.3 cm right popliteal artery
aneurysm.
